# Patient Record
Sex: Female | Born: 1955 | Race: Black or African American | Hispanic: No | Smoking: Never smoker
Health system: Southern US, Community
[De-identification: ages and names within clinical notes are randomized; demographics above are authoritative.]

## PROBLEM LIST (undated history)

## (undated) DIAGNOSIS — F419 Anxiety disorder, unspecified: Secondary | ICD-10-CM

## (undated) DIAGNOSIS — K449 Diaphragmatic hernia without obstruction or gangrene: Secondary | ICD-10-CM

## (undated) DIAGNOSIS — Z460 Encounter for fitting and adjustment of spectacles and contact lenses: Secondary | ICD-10-CM

## (undated) DIAGNOSIS — J189 Pneumonia, unspecified organism: Secondary | ICD-10-CM

## (undated) DIAGNOSIS — K219 Gastro-esophageal reflux disease without esophagitis: Secondary | ICD-10-CM

## (undated) DIAGNOSIS — Z8489 Family history of other specified conditions: Secondary | ICD-10-CM

## (undated) DIAGNOSIS — G43909 Migraine, unspecified, not intractable, without status migrainosus: Secondary | ICD-10-CM

## (undated) DIAGNOSIS — F410 Panic disorder [episodic paroxysmal anxiety] without agoraphobia: Secondary | ICD-10-CM

## (undated) DIAGNOSIS — F329 Major depressive disorder, single episode, unspecified: Secondary | ICD-10-CM

## (undated) DIAGNOSIS — F32A Depression, unspecified: Secondary | ICD-10-CM

## (undated) DIAGNOSIS — K649 Unspecified hemorrhoids: Secondary | ICD-10-CM

## (undated) DIAGNOSIS — M1990 Unspecified osteoarthritis, unspecified site: Secondary | ICD-10-CM

## (undated) DIAGNOSIS — K5790 Diverticulosis of intestine, part unspecified, without perforation or abscess without bleeding: Secondary | ICD-10-CM

## (undated) DIAGNOSIS — M797 Fibromyalgia: Secondary | ICD-10-CM

## (undated) DIAGNOSIS — G629 Polyneuropathy, unspecified: Secondary | ICD-10-CM

## (undated) DIAGNOSIS — K2970 Gastritis, unspecified, without bleeding: Secondary | ICD-10-CM

## (undated) DIAGNOSIS — E119 Type 2 diabetes mellitus without complications: Secondary | ICD-10-CM

## (undated) HISTORY — DX: Diaphragmatic hernia without obstruction or gangrene: K44.9

## (undated) HISTORY — DX: Migraine, unspecified, not intractable, without status migrainosus: G43.909

## (undated) HISTORY — PX: UPPER GI ENDOSCOPY: SHX6162

## (undated) HISTORY — PX: TUBAL LIGATION: SHX77

## (undated) HISTORY — DX: Gastro-esophageal reflux disease without esophagitis: K21.9

## (undated) HISTORY — DX: Diverticulosis of intestine, part unspecified, without perforation or abscess without bleeding: K57.90

## (undated) HISTORY — DX: Polyneuropathy, unspecified: G62.9

## (undated) HISTORY — DX: Anxiety disorder, unspecified: F41.9

## (undated) HISTORY — PX: COLONOSCOPY: SHX174

## (undated) HISTORY — DX: Major depressive disorder, single episode, unspecified: F32.9

## (undated) HISTORY — PX: JOINT REPLACEMENT: SHX530

## (undated) HISTORY — DX: Depression, unspecified: F32.A

## (undated) HISTORY — DX: Gastritis, unspecified, without bleeding: K29.70

## (undated) HISTORY — PX: BUNIONECTOMY: SHX129

## (undated) HISTORY — DX: Type 2 diabetes mellitus without complications: E11.9

---

## 1978-12-23 ENCOUNTER — Encounter: Payer: Self-pay | Admitting: Family Medicine

## 1999-10-29 ENCOUNTER — Encounter: Payer: Self-pay | Admitting: Family Medicine

## 1999-10-29 ENCOUNTER — Encounter: Admission: RE | Admit: 1999-10-29 | Discharge: 1999-10-29 | Payer: Self-pay | Admitting: Family Medicine

## 2000-02-07 ENCOUNTER — Other Ambulatory Visit: Admission: RE | Admit: 2000-02-07 | Discharge: 2000-02-07 | Payer: Self-pay | Admitting: *Deleted

## 2000-09-29 HISTORY — PX: CARPAL TUNNEL RELEASE: SHX101

## 2001-03-08 ENCOUNTER — Encounter: Admission: RE | Admit: 2001-03-08 | Discharge: 2001-03-08 | Payer: Self-pay | Admitting: *Deleted

## 2001-03-08 ENCOUNTER — Other Ambulatory Visit: Admission: RE | Admit: 2001-03-08 | Discharge: 2001-03-08 | Payer: Self-pay | Admitting: *Deleted

## 2001-03-08 ENCOUNTER — Encounter: Payer: Self-pay | Admitting: *Deleted

## 2004-05-23 ENCOUNTER — Ambulatory Visit (HOSPITAL_COMMUNITY): Admission: RE | Admit: 2004-05-23 | Discharge: 2004-05-23 | Payer: Self-pay | Admitting: Orthopedic Surgery

## 2004-05-23 ENCOUNTER — Ambulatory Visit (HOSPITAL_BASED_OUTPATIENT_CLINIC_OR_DEPARTMENT_OTHER): Admission: RE | Admit: 2004-05-23 | Discharge: 2004-05-23 | Payer: Self-pay | Admitting: Orthopedic Surgery

## 2006-12-28 ENCOUNTER — Ambulatory Visit: Payer: Self-pay | Admitting: Internal Medicine

## 2006-12-29 ENCOUNTER — Ambulatory Visit: Payer: Self-pay | Admitting: Internal Medicine

## 2007-04-23 ENCOUNTER — Encounter: Admission: RE | Admit: 2007-04-23 | Discharge: 2007-04-23 | Payer: Self-pay | Admitting: Family Medicine

## 2007-08-25 ENCOUNTER — Encounter: Admission: RE | Admit: 2007-08-25 | Discharge: 2007-08-25 | Payer: Self-pay | Admitting: Family Medicine

## 2008-08-29 LAB — CONVERTED CEMR LAB: Pap Smear: NORMAL

## 2008-09-29 HISTORY — PX: SHOULDER ARTHROSCOPY WITH ROTATOR CUFF REPAIR: SHX5685

## 2008-12-19 ENCOUNTER — Ambulatory Visit: Payer: Self-pay | Admitting: Family Medicine

## 2008-12-19 DIAGNOSIS — J309 Allergic rhinitis, unspecified: Secondary | ICD-10-CM | POA: Insufficient documentation

## 2008-12-19 DIAGNOSIS — K5732 Diverticulitis of large intestine without perforation or abscess without bleeding: Secondary | ICD-10-CM | POA: Insufficient documentation

## 2008-12-19 DIAGNOSIS — K253 Acute gastric ulcer without hemorrhage or perforation: Secondary | ICD-10-CM | POA: Insufficient documentation

## 2008-12-19 DIAGNOSIS — E7800 Pure hypercholesterolemia, unspecified: Secondary | ICD-10-CM | POA: Insufficient documentation

## 2008-12-19 DIAGNOSIS — G43009 Migraine without aura, not intractable, without status migrainosus: Secondary | ICD-10-CM | POA: Insufficient documentation

## 2008-12-19 DIAGNOSIS — F332 Major depressive disorder, recurrent severe without psychotic features: Secondary | ICD-10-CM | POA: Insufficient documentation

## 2008-12-19 DIAGNOSIS — E1169 Type 2 diabetes mellitus with other specified complication: Secondary | ICD-10-CM | POA: Insufficient documentation

## 2008-12-25 ENCOUNTER — Encounter: Payer: Self-pay | Admitting: Family Medicine

## 2009-01-02 ENCOUNTER — Telehealth: Payer: Self-pay | Admitting: Family Medicine

## 2009-01-24 ENCOUNTER — Ambulatory Visit: Payer: Self-pay | Admitting: Family Medicine

## 2009-02-07 ENCOUNTER — Encounter: Payer: Self-pay | Admitting: Family Medicine

## 2009-02-12 ENCOUNTER — Encounter: Payer: Self-pay | Admitting: Family Medicine

## 2009-04-05 ENCOUNTER — Encounter: Payer: Self-pay | Admitting: Family Medicine

## 2009-04-18 ENCOUNTER — Encounter: Payer: Self-pay | Admitting: Family Medicine

## 2009-04-24 ENCOUNTER — Encounter: Payer: Self-pay | Admitting: Family Medicine

## 2009-04-27 ENCOUNTER — Ambulatory Visit: Payer: Self-pay | Admitting: Family Medicine

## 2009-04-27 DIAGNOSIS — I152 Hypertension secondary to endocrine disorders: Secondary | ICD-10-CM | POA: Insufficient documentation

## 2009-04-27 DIAGNOSIS — I10 Essential (primary) hypertension: Secondary | ICD-10-CM | POA: Insufficient documentation

## 2009-05-16 ENCOUNTER — Encounter: Payer: Self-pay | Admitting: Family Medicine

## 2009-06-13 ENCOUNTER — Encounter: Payer: Self-pay | Admitting: Family Medicine

## 2009-07-11 ENCOUNTER — Encounter: Payer: Self-pay | Admitting: Family Medicine

## 2009-08-08 ENCOUNTER — Encounter: Payer: Self-pay | Admitting: Family Medicine

## 2009-08-21 ENCOUNTER — Ambulatory Visit: Payer: Self-pay | Admitting: Family Medicine

## 2009-08-29 ENCOUNTER — Encounter: Payer: Self-pay | Admitting: Family Medicine

## 2009-09-06 ENCOUNTER — Ambulatory Visit: Payer: Self-pay | Admitting: Family Medicine

## 2009-09-06 LAB — CONVERTED CEMR LAB
HDL: 50 mg/dL
LDL Cholesterol: 154 mg/dL

## 2009-09-17 ENCOUNTER — Encounter: Payer: Self-pay | Admitting: Family Medicine

## 2009-09-26 ENCOUNTER — Encounter: Payer: Self-pay | Admitting: Family Medicine

## 2009-10-06 ENCOUNTER — Telehealth: Payer: Self-pay | Admitting: Family Medicine

## 2009-10-29 ENCOUNTER — Encounter: Payer: Self-pay | Admitting: Family Medicine

## 2009-11-07 ENCOUNTER — Encounter: Payer: Self-pay | Admitting: Family Medicine

## 2010-01-26 ENCOUNTER — Encounter: Payer: Self-pay | Admitting: Family Medicine

## 2010-02-15 ENCOUNTER — Ambulatory Visit: Payer: Self-pay | Admitting: Family Medicine

## 2010-04-24 ENCOUNTER — Encounter: Payer: Self-pay | Admitting: Family Medicine

## 2010-06-04 ENCOUNTER — Ambulatory Visit: Payer: Self-pay | Admitting: Family Medicine

## 2010-06-06 ENCOUNTER — Encounter: Payer: Self-pay | Admitting: Family Medicine

## 2010-07-29 ENCOUNTER — Ambulatory Visit: Payer: Self-pay | Admitting: Family Medicine

## 2010-09-10 ENCOUNTER — Ambulatory Visit: Payer: Self-pay | Admitting: Family Medicine

## 2010-10-29 NOTE — Progress Notes (Signed)
  Phone Note Call from Patient   Caller: Patient Summary of Call: Call from Call a nurse.  Patient over 1 1/2 hours away, coughing for over a week.  Wheezing with sputum production.  No shortness of breath, no fevers.  Gets bronchitis regularly.  Agreed to call in abx eventhough we typically don't do that given that she it is a weekend and she lives so far away with bad road conditions.  She will call us if symptoms worsen. Initial call taken by: Ruthe Mannan MD,  October 06, 2009 10:51 AM    New/Updated Medications: AZITHROMYCIN 250 MG  TABS (AZITHROMYCIN) 2 by  mouth today and then 1 daily for 4 days Prescriptions: AZITHROMYCIN 250 MG  TABS (AZITHROMYCIN) 2 by  mouth today and then 1 daily for 4 days  #6 x 0   Entered and Authorized by:   Ruthe Mannan MD   Signed by:   Ruthe Mannan MD on 10/06/2009   Method used:   Electronically to        [facility] [address] (retail)       [address], [address]       [place], [place]  [address]       Ph: [phone]       Fax: [phone]   RxID:   [id number]

## 2010-10-29 NOTE — Assessment & Plan Note (Signed)
Summary: REFILL MEDICATION/CLE   Vital Signs:  Patient profile:   55 year old female Height:      62 inches Weight:      156 pounds BMI:     28.64 Temp:     97.8 degrees F oral Pulse rate:   72 / minute Pulse rhythm:   regular BP sitting:   102 / 74  (right arm) Cuff size:   regular  Vitals Entered By: Linde Gillis CMA Duncan Dull) (June 04, 2010 8:25 AM) CC: medication refills   History of Present Illness: Depression.."tolerable". Continues on fluoxetine. No SI, good sleep.  Poor energy.Marland Kitchen"I just don't want to do anything" Some anhedonia.  Occ panic attacks..using xanax 0-3 times a week.  ? did not tolerate lexapro in past.  Not interested in counselor.  Problems Prior to Update: 1)  Sinusitis, Acute  (ICD-461.9) 2)  Preventive Health Care  (ICD-V70.0) 3)  Routine Gynecological Examination  (ICD-V72.31) 4)  Prediabetes  (ICD-790.29) 5)  Elevated Blood Pressure Without Diagnosis of Hypertension  (ICD-796.2) 6)  Family History of Cad Female 1st Degree Relative <50  (ICD-V17.3) 7)  Migraine, Common  (ICD-346.10) 8)  Hx of Diverticulitis, Colon  (ICD-562.11) 9)  Depression  (ICD-311) 10)  Gastric Ulcer, Acute  (ICD-531.30) 11)  ? of Fibromyalgia  (ICD-729.1) 12)  Hypercholesterolemia  (ICD-272.0) 13)  Allergic Rhinitis  (ICD-477.9) 14)  Tubal Ligation Sterilization Status  (ICD-V26.51)  Current Medications (verified): 1)  Prevacid 15 Mg Cpdr (Lansoprazole) .... Once Daily 2)  Fexofenadine Hcl 180 Mg Tabs (Fexofenadine Hcl) .... Take 1 Tablet By Mouth Once A Day 3)  Multivitamins  Caps (Multiple Vitamin) .... One Capsule Daily. 4)  Fluoxetine Hcl 40 Mg Caps (Fluoxetine Hcl) .... Take 1 Tablet By Mouth Once A Day 5)  Topamax 100 Mg Tabs (Topiramate) .... Take One By Mouth Every Night 6)  Xanax 0.25 Mg Tabs (Alprazolam) .... Take One Tablet By Mouth As Needed  Allergies: 1)  ! Sulfa  Past History:  Past medical, surgical, family and social histories (including risk  factors) reviewed, and no changes noted (except as noted below).  Past Medical History: Reviewed history from 12/19/2008 and no changes required. ALLERGIC RHINITIS HYPERCHOLESTEROLEMIA FIBROMYALGIA Depression  Past Surgical History: Reviewed history from 04/27/2009 and no changes required. Carpal tunnel release-2006 Tubal ligation - 1980'S EGD: PUD  2009 Dr. Juanda Chance 02/2009 right rotator cuff repair  Family History: Reviewed history from 12/19/2008 and no changes required. FATHER-CHF MOTHER-HTN, HYPERCHOLESTEROLEMIA, ?CVA brother: MI age 24 Family History of CAD Female 1st degree relative <50 sister: fibromyalgia sister; high chol, HTN sister: DM  Social History: Reviewed history from 12/19/2008 and no changes required. 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 CV:  Denies chest pain or discomfort. Resp:  Denies shortness of breath.  Physical Exam  General:  Well-developed,well-nourished,in no acute distress; alert,appropriate and cooperative throughout examination Mouth:  MMM Neck:  no carotid bruit or thyromegaly no cervical or supraclavicular lymphadenopathy  Lungs:  Normal respiratory effort, chest expands symmetrically. Lungs are clear to auscultation, no crackles or wheezes. Heart:  Normal rate and regular rhythm. S1 and S2 normal without gallop, murmur, click, rub or other extra sounds. Pulses:  R and L posterior tibial pulses are full and equal bilaterally  Psych:  Cognition and judgment appear intact. Alert and cooperative with normal attention  span and concentration. No apparent delusions, illusions, hallucinations   Impression & Recommendations:  Problem # 1:  DEPRESSION (ICD-311) Discussed options to acheive better control of mood.Primitivo Gauze not currently interested in  counseling or change in medicaiotn. Will work on increasing exercise and stress reduction and relaxation techniques at home.  Her updated medication list for this problem includes:    Fluoxetine Hcl 40 Mg Caps (Fluoxetine hcl) .Marland Kitchen... Take 1 tablet by mouth once a day    Xanax 0.25 Mg Tabs (Alprazolam) .Marland Kitchen... Take one tablet by mouth as needed  Complete Medication List: 1)  Prevacid 15 Mg Cpdr (Lansoprazole) .... Once daily 2)  Fexofenadine Hcl 180 Mg Tabs (Fexofenadine hcl) .... Take 1 tablet by mouth once a day 3)  Multivitamins Caps (Multiple vitamin) .... One capsule daily. 4)  Fluoxetine Hcl 40 Mg Caps (Fluoxetine hcl) .... Take 1 tablet by mouth once a day 5)  Topamax 100 Mg Tabs (Topiramate) .... Take one by mouth every night 6)  Xanax 0.25 Mg Tabs (Alprazolam) .... Take one tablet by mouth as needed  Patient Instructions: 1)  Scheduled CPX with labs prior fastng in 2011 Prescriptions: FLUOXETINE HCL 40 MG CAPS (FLUOXETINE HCL) Take 1 tablet by mouth once a day  #90 x 3   Entered and Authorized by:   Kerby Nora MD   Signed by:   Kerby Nora MD on 06/04/2010   Method used:   Electronically to        [facility] (retail)       [address]       [place], [place]  [address]       Ph: [phone]       Fax: [phone]   RxID:   [id number]   Current Allergies (reviewed today): ! SULFA

## 2010-10-29 NOTE — Letter (Signed)
Summary: [facility]  [facility]   Imported By: Lanelle Bal 11/06/2009 12:18:34  _____________________________________________________________________  External Attachment:    Type:   Image     Comment:   External Document

## 2010-10-29 NOTE — Assessment & Plan Note (Signed)
Summary: SWOLLEN RIGHT GLAND,EAR/CLE   Vital Signs:  Patient profile:   55 year old female Weight:      161 pounds Temp:     97.7 degrees F oral Pulse rate:   88 / minute Pulse rhythm:   regular BP sitting:   150 / 100  (left arm) Cuff size:   regular  Vitals Entered By: Selena Batten Dance CMA Duncan Dull) (July 29, 2010 10:47 AM) CC: Swollen gland and right ear pain   History of Present Illness: CC: swolleng gland and r ear pain  5-6 d h/o R ear pain, swelling in throat causing pain to swallow, not necessarily sore throat. + RN started today.  + cough started today, slight sputum production.  No congestion.  No fevers/chills.  Has tried mucinex which helped some.  Ear pain - No hearing changes, dizziness, n/v, draining.  + mild ringing in ears.  No HA recently.  not drinking as much as she should be.  No smokers at home. + sick contacts.  Flu shot 2 wks ago, feeling poorly since.  bp up but had red bull this am  Current Medications (verified): 1)  Prevacid 15 Mg Cpdr (Lansoprazole) .... Once Daily 2)  Fexofenadine Hcl 180 Mg Tabs (Fexofenadine Hcl) .... Take 1 Tablet By Mouth Once A Day 3)  Multivitamins  Caps (Multiple Vitamin) .... One Capsule Daily. 4)  Fluoxetine Hcl 40 Mg Caps (Fluoxetine Hcl) .... Take 1 Tablet By Mouth Once A Day 5)  Topamax 100 Mg Tabs (Topiramate) .... Take One By Mouth Every Night 6)  Xanax 0.25 Mg Tabs (Alprazolam) .... Take One Tablet By Mouth As Needed  Allergies: 1)  ! 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 No apparent alopecia or balding.  no sinus tenderness Eyes:  Conjunctiva clear bilaterally.  Ears:  clear fluid R>L TMs, good light reflex, external canals not irritated or erythematous Nose:  clear Mouth:  MMM, mild pharyngeal erythema, no exudates Neck:  slight AC LAD Lungs:  Normal respiratory effort, chest expands symmetrically. Lungs are clear to auscultation, no crackles or wheezes. Heart:  Normal rate and regular rhythm. S1 and S2 normal without gallop, murmur, click, rub or other extra sounds. Pulses:  2+ rad pulses Extremities:  no pedal edema Skin:  Intact without suspicious lesions or rashes   Impression & Recommendations:  Problem # 1:  VIRAL URI (ICD-465.9) Instructed on symptomatic treatment. Call if symptoms persist or worsen.  advised to call at end of week, if still with sxs, in that case consider Zpack to help prevent bronchitis from developing.  declines expectorant/suppressant today.  Her updated medication list for this problem includes:    Fexofenadine Hcl 180 Mg Tabs (Fexofenadine hcl) .Marland Kitchen... Take 1 tablet by mouth once a day  Complete Medication List: 1)  Prevacid 15 Mg Cpdr (Lansoprazole) .... Once daily 2)  Fexofenadine Hcl 180 Mg Tabs (Fexofenadine hcl) .... Take 1 tablet by mouth once a day 3)  Multivitamins Caps (Multiple vitamin) .... One capsule daily. 4)  Fluoxetine Hcl 40 Mg Caps (Fluoxetine hcl) .... Take  1 tablet by mouth once a day 5)  Topamax 100 Mg Tabs (Topiramate) .... Take one by mouth every night 6)  Xanax 0.25 Mg Tabs (Alprazolam) .... Take one tablet by mouth as needed  Patient Instructions: 1)  Sounds like you have a viral upper respiratory infection. 2)  Antibiotics are not needed for this.  Viral infections usually take 7-10 days to resolve.  The cough can last up to 4-6 weeks to go away. 3)  Advil for inflammation. 4)  Please return if  you are not improving as expected, or if you have high fevers (>101.5) or difficulty breathing or drooling.  Push fluids and rest. 5)  Call clinic with questions.  Pleasure to see you today!    Orders Added: 1)  Est. Patient Level III [[address]]    Current Allergies (reviewed today): ! SULFA

## 2010-10-29 NOTE — Assessment & Plan Note (Signed)
Summary: HEAD COLD AND THROAT PROBLEMS CYD   Vital Signs:  Patient profile:   55 year old female Weight:      161 pounds Temp:     97.6 degrees F oral Pulse rate:   76 / minute Pulse rhythm:   regular BP sitting:   124 / 90  (left arm) Cuff size:   regular  Vitals Entered By: Lowella Petties CMA (Feb 15, 2010 4:40 PM)   Acute Visit History:      The patient complains of cough, earache, nasal discharge, sinus problems, and sore throat.  These symptoms began 5 days ago.  She denies fever.  Other comments include: body ache chills post nasal drip Using mucinex and Nyquil. .        The cough interferes with her sleep.  The character of the cough is described as nonproductive.  There is no history of wheezing or shortness of breath associated with her cough.        The earache is located on both sides.  There is no history of recent antibiotic usage associated with the earache.        She complains of sinus pressure, teeth aching, ears being blocked, and nasal congestion.        Problems Prior to Update: 1)  Preventive Health Care  (ICD-V70.0) 2)  Routine Gynecological Examination  (ICD-V72.31) 3)  Prediabetes  (ICD-790.29) 4)  Elevated Blood Pressure Without Diagnosis of Hypertension  (ICD-796.2) 5)  Family History of Cad Female 1st Degree Relative <50  (ICD-V17.3) 6)  Migraine, Common  (ICD-346.10) 7)  Hx of Diverticulitis, Colon  (ICD-562.11) 8)  Depression  (ICD-311) 9)  Gastric Ulcer, Acute  (ICD-531.30) 10)  ? of Fibromyalgia  (ICD-729.1) 11)  Hypercholesterolemia  (ICD-272.0) 12)  Allergic Rhinitis  (ICD-477.9) 13)  Tubal Ligation Sterilization Status  (ICD-V26.51)  Current Medications (verified): 1)  Prevacid 15 Mg Cpdr (Lansoprazole) .... Once Daily 2)  Fexofenadine Hcl 180 Mg Tabs (Fexofenadine Hcl) .... Take 1 Tablet By Mouth Once A Day 3)  Apap-Isometheptene-Caffeine 325-65-100 Mg Caps (Apap-Isometheptene-Caffeine) .... As Needed. 4)  Multivitamins  Caps (Multiple  Vitamin) .... One Capsule Daily. 5)  Fluoxetine Hcl 40 Mg Caps (Fluoxetine Hcl) .... Take 1 Tablet By Mouth Once A Day 6)  Topamax 100 Mg Tabs (Topiramate) .... Take One By Mouth Every Night 7)  Amoxicillin 500 Mg Caps (Amoxicillin) .... 2 Tab By Mouth Two Times A Day X 10 Days  Allergies (verified): 1)  ! Sulfa  Past History:  Past medical, surgical, family and social histories (including risk factors) reviewed, and no changes noted (except as noted below).  Past Medical History: Reviewed history from 12/19/2008 and no changes required. ALLERGIC RHINITIS HYPERCHOLESTEROLEMIA FIBROMYALGIA Depression  Past Surgical History: Reviewed history from 04/27/2009 and no changes required. Carpal tunnel release-2006 Tubal ligation - 1980'S EGD: PUD  2009 Dr. Juanda Chance 02/2009 right rotator cuff repair  Family History: Reviewed history from 12/19/2008 and no changes required. FATHER-CHF MOTHER-HTN, HYPERCHOLESTEROLEMIA, ?CVA brother: MI age 29 Family History of CAD Female 1st degree relative <50 sister: fibromyalgia sister; high chol, HTN sister: DM  Social History: Reviewed history from 12/19/2008 and no changes required. 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 CV:  Denies chest pain or discomfort. Resp:  Denies shortness of breath. GI:  Denies abdominal pain.  Physical Exam  General:  Well-developed,well-nourished,in no acute distress; alert,appropriate and cooperative throughout examination Head:  ttp B maxilarry sinuses  Ears:  clear fluid B TMs Nose:  nasal dischargemucosal pallor.   Mouth:  MMM, no oropharyngeal erythema.  Neck:  no carotid bruit or thyromegaly no cervical or supraclavicular lymphadenopathy  Lungs:  Normal respiratory effort, chest expands symmetrically. Lungs are clear to auscultation, no  crackles or wheezes. Heart:  Normal rate and regular rhythm. S1 and S2 normal without gallop, murmur, click, rub or other extra sounds.   Impression & Recommendations:  Problem # 1:  SINUSITIS, ACUTE (ICD-461.9)  Her updated medication list for this problem includes:    Amoxicillin 500 Mg Caps (Amoxicillin) .Marland Kitchen... 2 tab by mouth two times a day x 10 days  Instructed on treatment. Call if symptoms persist or worsen.   Complete Medication List: 1)  Prevacid 15 Mg Cpdr (Lansoprazole) .... Once daily 2)  Fexofenadine Hcl 180 Mg Tabs (Fexofenadine hcl) .... Take 1 tablet by mouth once a day 3)  Apap-isometheptene-caffeine 325-65-100 Mg Caps (Apap-isometheptene-caffeine) .... As needed. 4)  Multivitamins Caps (Multiple vitamin) .... One capsule daily. 5)  Fluoxetine Hcl 40 Mg Caps (Fluoxetine hcl) .... Take 1 tablet by mouth once a day 6)  Topamax 100 Mg Tabs (Topiramate) .... Take one by mouth every night 7)  Amoxicillin 500 Mg Caps (Amoxicillin) .... 2 tab by mouth two times a day x 10 days  Patient Instructions: 1)  Use mucinex, no D. 2)   Start nasal saline irrigation. 3)  Start antibiotics.  4)   Call if symtoms not improving in 5-7 days.  Prescriptions: AMOXICILLIN 500 MG CAPS (AMOXICILLIN) 2 tab by mouth two times a day x 10 days  #40 x 0   Entered and Authorized by:   Kerby Nora MD   Signed by:   Kerby Nora MD on 02/15/2010   Method used:   Electronically to        [facility] (retail)       [address], [address]       [place], [place]  [address]       Ph: [phone]       Fax: [phone]   RxID:   [id number]   Prior Medications (reviewed today): PREVACID 15 MG CPDR (LANSOPRAZOLE) ONCE DAILY FEXOFENADINE HCL 180 MG TABS (FEXOFENADINE HCL) Take 1 tablet by mouth once a day APAP-ISOMETHEPTENE-CAFFEINE 325-65-100 MG CAPS (APAP-ISOMETHEPTENE-CAFFEINE) AS NEEDED. MULTIVITAMINS  CAPS (MULTIPLE VITAMIN) ONE CAPSULE  DAILY. FLUOXETINE HCL 40 MG CAPS (FLUOXETINE HCL) Take 1 tablet by mouth once a day TOPAMAX 100 MG TABS (TOPIRAMATE) take one by mouth every night AMOXICILLIN 500 MG CAPS (AMOXICILLIN) 2 tab by mouth two times a day x 10 days Current Allergies (reviewed today): ! SULFA

## 2010-10-29 NOTE — Letter (Signed)
Summary: [facility]  [facility]   Imported By: Lanelle Bal 11/12/2009 11:44:01  _____________________________________________________________________  External Attachment:    Type:   Image     Comment:   External Document

## 2010-10-29 NOTE — Letter (Signed)
Summary: [facility]  [facility]   Imported By: Sherian Rein 06/14/2010 09:47:22  _____________________________________________________________________  External Attachment:    Type:   Image     Comment:   External Document

## 2010-10-29 NOTE — Letter (Signed)
Summary: [facility]  [facility]   Imported By: Lanelle Bal 10/02/2009 12:15:50  _____________________________________________________________________  External Attachment:    Type:   Image     Comment:   External Document

## 2010-11-01 NOTE — Letter (Signed)
Summary: [facility]  [facility]   Imported By: Maryln Gottron 04/29/2010 14:56:14  _____________________________________________________________________  External Attachment:    Type:   Image     Comment:   External Document

## 2010-12-10 ENCOUNTER — Other Ambulatory Visit: Payer: Self-pay | Admitting: Family Medicine

## 2010-12-10 ENCOUNTER — Encounter (INDEPENDENT_AMBULATORY_CARE_PROVIDER_SITE_OTHER): Payer: 59 | Admitting: Family Medicine

## 2010-12-10 ENCOUNTER — Encounter: Payer: Self-pay | Admitting: Family Medicine

## 2010-12-10 ENCOUNTER — Other Ambulatory Visit (HOSPITAL_COMMUNITY)
Admission: RE | Admit: 2010-12-10 | Discharge: 2010-12-10 | Disposition: A | Discharge status: Home / Self Care | Payer: 59 | Source: Ambulatory Visit | Attending: Family Medicine | Admitting: Family Medicine

## 2010-12-10 DIAGNOSIS — Z01419 Encounter for gynecological examination (general) (routine) without abnormal findings: Secondary | ICD-10-CM

## 2010-12-10 DIAGNOSIS — Z1159 Encounter for screening for other viral diseases: Secondary | ICD-10-CM | POA: Insufficient documentation

## 2010-12-10 DIAGNOSIS — I10 Essential (primary) hypertension: Secondary | ICD-10-CM

## 2010-12-10 DIAGNOSIS — Z0000 Encounter for general adult medical examination without abnormal findings: Secondary | ICD-10-CM

## 2010-12-10 LAB — CONVERTED CEMR LAB
Bilirubin Urine: NEGATIVE
Glucose, Urine, Semiquant: NEGATIVE
Ketones, urine, test strip: NEGATIVE
Specific Gravity, Urine: >=1.03

## 2010-12-17 NOTE — Assessment & Plan Note (Signed)
Summary: cpx   Vital Signs:  Patient profile:   55 year old female Height:      62 inches Weight:      163.50 pounds BMI:     30.01 Temp:     98.6 degrees F oral Pulse rate:   88 / minute Pulse rhythm:   regular BP sitting:   120 / 90  (left arm) Cuff size:   regular  Vitals Entered By: Benny Lennert CMA Duncan Dull) (December 10, 2010 2:43 PM)  History of Present Illness: Chief complaint cpx  The patient is here for annual wellness exam and preventative care.    Depression: Continues to be tolerableon  fluoxetine daily, Using xanax as needed.Marland Kitchen about 2-3 times a month.   Did have a panic attack yesterday after returning to work after vacation.Marland Kitchen arms B felt weak/numb for few hours, panicky  High cholesterol:  Due for recheck  Prediabetes: Due for recheck.  History of BP elevation.. no diagnosis...she has noted BPs more consistently elevated at home and at work.  2 lb weight  gain. Started Yoga. Treadmill 3 times a week.  Has been eating healthy foods...fruits and veggies. Strong family history of HTN.  Migraines.. well controlled on topamax 100 daily.Marland Kitchen occuring once a month. Using amerge once a month.  Problems Prior to Update: 1)  Preventive Health Care  (ICD-V70.0) 2)  Routine Gynecological Examination  (ICD-V72.31) 3)  Prediabetes  (ICD-790.29) 4)  Essential Hypertension, Benign  (ICD-401.1) 5)  Family History of Cad Female 1st Degree Relative <50  (ICD-V17.3) 6)  Migraine, Common  (ICD-346.10) 7)  Hx of Diverticulitis, Colon  (ICD-562.11) 8)  Depression  (ICD-311) 9)  Gastric Ulcer, Acute  (ICD-531.30) 10)  ? of Fibromyalgia  (ICD-729.1) 11)  Hypercholesterolemia  (ICD-272.0) 12)  Allergic Rhinitis  (ICD-477.9) 13)  Tubal Ligation Sterilization Status  (ICD-V26.51)  Current Medications (verified): 1)  Prevacid 15 Mg Cpdr (Lansoprazole) .... Once Daily 2)  Fexofenadine Hcl 180 Mg Tabs (Fexofenadine Hcl) .... Take 1 Tablet By Mouth Once A Day 3)  Multivitamins  Caps  (Multiple Vitamin) .... One Capsule Daily. 4)  Fluoxetine Hcl 40 Mg Caps (Fluoxetine Hcl) .... Take 1 Tablet By Mouth Once A Day 5)  Topamax 100 Mg Tabs (Topiramate) .... Take One By Mouth Every Night 6)  Xanax 0.25 Mg Tabs (Alprazolam) .... Take One Tablet By Mouth As Needed 7)  Amerge 1 Mg Tabs (Naratriptan Hcl) .... As Needed For Headache 8)  Nabumetone 500 Mg Tabs (Nabumetone) .... As Directed For Headache  Allergies: 1)  ! Sulfa  Past History:  Past medical, surgical, family and social histories (including risk factors) reviewed, and no changes noted (except as noted below).  Past Medical History: Reviewed history from 12/19/2008 and no changes required. ALLERGIC RHINITIS HYPERCHOLESTEROLEMIA FIBROMYALGIA Depression  Past Surgical History: Reviewed history from 04/27/2009 and no changes required. Carpal tunnel release-2006 Tubal ligation - 1980'S EGD: PUD  2009 Dr. Juanda Chance 02/2009 right rotator cuff repair  Family History: Reviewed history from 12/19/2008 and no changes required. FATHER-CHF MOTHER-HTN, HYPERCHOLESTEROLEMIA, ?CVA brother: MI age 57 Family History of CAD Female 1st degree relative <50 sister: fibromyalgia sister; high chol, HTN sister: DM  Social History: Reviewed history from 12/19/2008 and no changes required. 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 CV:  Denies chest pain or discomfort. Resp:  Denies shortness  of breath. GI:  Denies abdominal pain and bloody stools. GU:  Denies dysuria.  Physical Exam  General:  overweight female in NAD  Ears:  External ear exam shows no significant lesions or deformities.  Otoscopic examination reveals clear canals, tympanic membranes are intact bilaterally without bulging, retraction, inflammation or discharge. Hearing is grossly normal  bilaterally. Nose:  External nasal examination shows no deformity or inflammation. Nasal mucosa are pink and moist without lesions or exudates. Mouth:  Oral mucosa and oropharynx without lesions or exudates.  Teeth in good repair. Neck:  no carotid bruit or thyromegaly no cervical or supraclavicular lymphadenopathy  Lungs:  Normal respiratory effort, chest expands symmetrically. Lungs are clear to auscultation, no crackles or wheezes. Heart:  Normal rate and regular rhythm. S1 and S2 normal without gallop, murmur, click, rub or other extra sounds. Abdomen:  Bowel sounds positive,abdomen soft and non-tender without masses, organomegaly or hernias noted. Genitalia:  Pelvic Exam:        External: normal female genitalia without lesions or masses        Vagina: normal without lesions or masses        Cervix: normal without lesions or masses        Adnexa: normal bimanual exam without masses or fullness        Uterus: normal by palpation        Pap smear: performed Msk:  No deformity or scoliosis noted of thoracic or lumbar spine.   Pulses:  R and L posterior tibial pulses are full and equal bilaterally  Extremities:  no edema  Neurologic:  No cranial nerve deficits noted. Station and gait are normal. Plantar reflexes are down-going bilaterally. DTRs are symmetrical throughout. Sensory, motor and coordinative functions appear intact. Skin:  Intact without suspicious lesions or rashes Psych:  Cognition and judgment appear intact. Alert and cooperative with normal attention span and concentration. No apparent delusions, illusions, hallucinations   Impression & Recommendations:  Problem # 1:  PREVENTIVE HEALTH CARE (ICD-V70.0) The patient's preventative maintenance and recommended screening tests for an annual wellness exam were reviewed in full today. Brought up to date unless services declined.  Counselled on the importance of diet, exercise, and its role in overall health and mortality. The  patient's FH and SH was reviewed, including their home life, tobacco status, and drug and alcohol status.     Problem # 2:  ROUTINE GYNECOLOGICAL EXAMINATION (ICD-V72.31) PAP pending.   Problem # 3:  PREDIABETES (ICD-790.29) Due for reeval.   Problem # 4:  ESSENTIAL HYPERTENSION, BENIGN (ICD-401.1) Assessment: New  Eval for secondary causes, end organ damage, and eval for risk factors. Will start HCTZ daily. Follow Bps at home.. call if above goal.  Orders: UA Dipstick w/o Micro (manual) (21308): trace protein in urine... eval kidney function.  EKG w/ Interpretation (93000): showed  Her updated medication list for this problem includes:    Hydrochlorothiazide 25 Mg Tabs (Hydrochlorothiazide) .Marland Kitchen... 1 tab by mouth daily  Problem # 5:  DEPRESSION (ICD-311) Stable control. Her updated medication list for this problem includes:    Fluoxetine Hcl 40 Mg Caps (Fluoxetine hcl) .Marland Kitchen... Take 1 tablet by mouth once a day    Xanax 0.25 Mg Tabs (Alprazolam) .Marland Kitchen... Take one tablet by mouth as needed  Complete Medication List: 1)  Prevacid 15 Mg Cpdr (Lansoprazole) .... Once daily 2)  Fexofenadine Hcl 180 Mg Tabs (Fexofenadine hcl) .... Take 1 tablet by mouth once a day 3)  Multivitamins Caps (Multiple vitamin) .Marland KitchenMarland KitchenMarland Kitchen  One capsule daily. 4)  Fluoxetine Hcl 40 Mg Caps (Fluoxetine hcl) .... Take 1 tablet by mouth once a day 5)  Topamax 100 Mg Tabs (Topiramate) .... Take one by mouth every night 6)  Xanax 0.25 Mg Tabs (Alprazolam) .... Take one tablet by mouth as needed 7)  Amerge 1 Mg Tabs (Naratriptan hcl) .... As needed for headache 8)  Nabumetone 500 Mg Tabs (Nabumetone) .... As directed for headache 9)  Hydrochlorothiazide 25 Mg Tabs (Hydrochlorothiazide) .Marland Kitchen.. 1 tab by mouth daily  Patient Instructions: 1)  Work on regular exercise. Weight loss. 2)  Follow BP at home ... goal <140/90. 3)  Start HCTZ for BP control. 4)   Follow up in 2 weeks.  Prescriptions: HYDROCHLOROTHIAZIDE 25 MG TABS  (HYDROCHLOROTHIAZIDE) 1 tab by mouth daily  #30 x 11   Entered and Authorized by:   Kerby Nora MD   Signed by:   Kerby Nora MD on 12/10/2010   Method used:   Electronically to        [facility] [address] (retail)       [address], [address]       [place], [place]  [address]       Ph: [phone]       Fax: [phone]   RxID:   [id number] Prudy Feeler 0.25 MG TABS (ALPRAZOLAM) take one tablet by mouth as needed  #30 x 0   Entered and Authorized by:   Kerby Nora MD   Signed by:   Kerby Nora MD on 12/10/2010   Method used:   Print then Give to Patient   RxID:   [id number] TOPAMAX 100 MG TABS (TOPIRAMATE) take one by mouth every night  #90 x 3   Entered and Authorized by:   Kerby Nora MD   Signed by:   Kerby Nora MD on 12/10/2010   Method used:   Electronically to        [facility] (retail)       [facility]       [address]       [place], [place]  [address]       Ph: [phone]       Fax: [phone]   RxID:   [id number]    Orders Added: 1)  UA Dipstick w/o Micro (manual) [81002] 2)  EKG w/ Interpretation [93000] 3)  Est. Patient 40-64 years [06269]     Current Allergies (reviewed today): ! SULFA  Last Flu Vaccine:  given (09/06/2009 2:23:47 PM) Flu Vaccine Result Date:  08/29/2010 Flu Vaccine Result:  given Flu Vaccine Next Due:  1 yr    Past Medical History:    Reviewed history from 12/19/2008 and no changes required:       ALLERGIC RHINITIS       HYPERCHOLESTEROLEMIA       FIBROMYALGIA       Depression  Past Surgical History:    Reviewed history from 04/27/2009 and no changes required:       Carpal tunnel release-2006       Tubal ligation - 1980'S       EGD: PUD  2009 Dr. Juanda Chance       02/2009 right rotator cuff repair     Laboratory Results   Urine Tests  Date/Time Received: December 10, 2010 4:05 PM   Routine Urinalysis   Color: yellow Appearance:  Clear Glucose: negative   (Normal Range: Negative)  Bilirubin: negative   (Normal Range: Negative) Ketone: negative   (Normal Range: Negative) Spec. Gravity: >=1.030   (Normal Range: 1.003-1.035) Blood: trace-intact   (Normal Range: Negative) pH: 5.0   (Normal Range: 5.0-8.0) Protein: trace   (Normal Range: Negative) Urobilinogen: 0.2   (Normal Range: 0-1) Nitrite: negative   (Normal Range: Negative) Leukocyte Esterace: negative   (Normal Range: Negative)       Appended Document: cpx EKG showed no LVH , occ PAC, no ST changes, no Q waves.

## 2011-01-02 LAB — HEPATIC FUNCTION PANEL: AST: 81 U/L — AB (ref 13–35)

## 2011-01-02 LAB — BASIC METABOLIC PANEL
BUN: 22 mg/dL — AB (ref 4–21)
Glucose: 107 mg/dL

## 2011-01-02 LAB — LIPID PANEL
HDL: 48 mg/dL (ref 35–70)
LDL Cholesterol: -127 mg/dL

## 2011-01-21 ENCOUNTER — Ambulatory Visit: Payer: 59 | Admitting: Family Medicine

## 2011-02-04 ENCOUNTER — Encounter: Payer: Self-pay | Admitting: Family Medicine

## 2011-02-10 ENCOUNTER — Encounter: Payer: Self-pay | Admitting: Family Medicine

## 2011-02-14 NOTE — Op Note (Signed)
Lisa Odom, Lisa Odom                             ACCOUNT NO.:  [id number]   MEDICAL RECORD NO.:  [id number]                   PATIENT TYPE:  AMB   LOCATION:  [facility]                                  FACILITY:  [facility]   PHYSICIAN:  Cindee Salt, M.D.                    DATE OF BIRTH:  22-Mar-1956   DATE OF PROCEDURE:  05/23/2004  DATE OF DISCHARGE:                                 OPERATIVE REPORT   PREOPERATIVE DIAGNOSIS:  Carpal tunnel syndrome, right hand.   POSTOPERATIVE DIAGNOSIS:  Carpal tunnel syndrome, right hand.   OPERATION PERFORMED:  Decompression, right median nerve.   SURGEON:  Cindee Salt, M.D.   ASSISTANTCarolyne Fiscal.   ANESTHESIA:  Forearm based IV regional.   INDICATIONS FOR PROCEDURE:  The patient is a 55 year old female with a  history of carpal tunnel syndrome, EMG nerve conduction studies positive,  which has not responded to conservative treatment.   DESCRIPTION OF PROCEDURE:  The patient was brought to the operating room  where forearm based IV regional anesthetic was carried out without  difficulty.  She was prepped using DuraPrep in supine position, right arm  free.  A longitudinal incision was made in the palm and carried down through  subcutaneous tissue.  Bleeders were electrocauterized.  Palmar fascia was  split.  Superficial palmar arch was identified, the flexor tendon to the  ring finger identified to the ulnar side of the median nerve.  Carpal  retinaculum was incised with sharp dissection.  Right angle and Sewell  retractor were placed between skin and forearm fascia.  The fascia was  released for approximately 1.5 cm proximal to the wrist crease under direct  vision.  No further lesions were identified.  The area of compression of the  nerve was apparent.  Tenosynovial tissue was thickened.  The wound was  irrigated.  The skin was closed with interrupted 5-0 nylon suture.  Sterile  compressive dressing and splint was applied.  The patient tolerated the  procedure well and was taken to the recovery room for observation in  satisfactory condition.  She is discharged to home to return to the [facility] in one week on Vicodin.                                               Cindee Salt, M.D.    Angelique Blonder  D:  05/23/2004  T:  05/23/2004  Job:  [id number]

## 2011-02-14 NOTE — Assessment & Plan Note (Signed)
[facility]                         GASTROENTEROLOGY OFFICE NOTE   Lisa Odom                          MRN:          [id number]  DATE:12/28/2006                            DOB:          10-02-1955    [name] is a very nice 55 year old patient of Dr. Duaine Dredge who we saw  in the past for rectal bleeding.  She had a colonoscopy in March 2005  with findings of diverticulosis of the left colon.  This time she  developed dark maroon stools, about 12 or 13 days ago.  She at that time  was taking up to 8-10 Excedrin a day for neck pain and headaches.  She  denies any epigastric pain at the time of the bleeding.  The melena or  the dark stools, went on for at least 4 days.  She thinks she has had at  least 6-10 melenic bowel movements.  It finally cleared up this past  weekend and for past several days patient has had normal colored stools.  Dr. Duaine Dredge  advised her to stop taking Excedrin and NSAIDs  and put  her on Celebrex 200 mg three times a day.  He also advised to continue  Prilosec 20 mg daily.  Her hemoglobin at time of the bleeding was at  12.2, hematocrit 37.  Previous hemoglobin in November 2007 was 12.6,  hematocrit 37.2. There is a nondocumented history of a duodenal ulcer in  the past, at age of 17 or 76. Pt never had an upper endoscopy or upper  GI series to prove the diagnosis.   MEDICATIONS:  1. Prilosec 20 mg p.o. daily.  2. Celebrex 200 mg p.o. t.i.d.  3. Provera 2.5 mg p.o. daily.  4. Estradiol 10.5 mg daily.  5. Also on alprazolam 0.5 mg p.r.n.   PAST HISTORY:  Significant for migraine headaches, depression, anxiety.   FAMILY HISTORY:  Negative for colon cancer, heart disease in brother,  ovarian cancer in cousin, breast cancer in grandmother, diabetes in aunt  and sister, alcoholism in uncle.   SOCIAL HISTORY:  Single with college degree.  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  She does not smoke, drinks  alcohol rarely.   REVIEW OF SYSTEMS:  Positive for eyeglasses, allergies.   PHYSICAL EXAMINATION:  Blood pressure 128/72, pulse 88, weight 161  pounds.  She was alert, oriented, in no distress.  HEENT:  Sclerae anicteric.  LUNGS:  Clear to auscultation.  COR:  Normal S1, S2.  ABDOMEN:  Soft with minimal tenderness in the epigastrium, no  pulsations, no mass.  Right upper quadrant was normal.  RECTAL:  Today shows soft, yellow, Hemoccult negative stool.   IMPRESSION:  A 55 year old African American female with what sounds like  an acute upper gastrointestinal bleed related to aspirin.  She did not  seem to drop significant hemoglobin, but the description of the stools  and the association with aspirin makes it very likely that it was an  upper, rather than a lower gastrointestinal bleed.  We discussed use of  NSAIDs and aspirin with her and  the risk of gastrointestinal blood loss.  Because of magnitude of the bleeding, lasting for several days, I think  she ought to have an upper endoscopy to look for duodenal or gastric  ulcer.   PLAN:  1. Patient was scheduled for upper endoscopy.  We will also proceed      with H. pylori  testing at that time.  2. Continue Prilosec 20 mg daily.  3. I would hold off on her Celebrex until we can do an upper      endoscopy.  She may benefit from further evaluation of her      headaches.     [name], MD  Electronically Signed    DMB/MedQ  DD: 12/28/2006  DT: 12/28/2006  Job #: [id number]   cc:   Mosetta Putt, M.D.

## 2011-02-19 ENCOUNTER — Encounter: Payer: Self-pay | Admitting: Family Medicine

## 2011-06-14 ENCOUNTER — Other Ambulatory Visit: Payer: Self-pay | Admitting: Family Medicine

## 2011-09-05 ENCOUNTER — Ambulatory Visit (INDEPENDENT_AMBULATORY_CARE_PROVIDER_SITE_OTHER)
Admission: RE | Admit: 2011-09-05 | Discharge: 2011-09-05 | Disposition: A | Discharge status: Home / Self Care | Payer: 59 | Source: Ambulatory Visit | Attending: Family Medicine | Admitting: Family Medicine

## 2011-09-05 ENCOUNTER — Encounter: Payer: Self-pay | Admitting: Family Medicine

## 2011-09-05 ENCOUNTER — Ambulatory Visit (INDEPENDENT_AMBULATORY_CARE_PROVIDER_SITE_OTHER): Payer: 59 | Admitting: Family Medicine

## 2011-09-05 VITALS — BP 120/70 | HR 70 | Temp 97.8°F | Ht 62.0 in | Wt 168.4 lb

## 2011-09-05 DIAGNOSIS — M542 Cervicalgia: Secondary | ICD-10-CM

## 2011-09-05 DIAGNOSIS — F329 Major depressive disorder, single episode, unspecified: Secondary | ICD-10-CM

## 2011-09-05 DIAGNOSIS — K219 Gastro-esophageal reflux disease without esophagitis: Secondary | ICD-10-CM

## 2011-09-05 MED ORDER — DULOXETINE HCL 30 MG PO CPEP: 30.0000 mg | ORAL_CAPSULE | Freq: Every day | ORAL | Status: DC

## 2011-09-05 MED ORDER — CYCLOBENZAPRINE HCL 10 MG PO TABS: 10.0000 mg | ORAL_TABLET | Freq: Every evening | ORAL | Status: AC | PRN

## 2011-09-05 MED ORDER — PANTOPRAZOLE SODIUM 40 MG PO TBEC: 40.0000 mg | DELAYED_RELEASE_TABLET | Freq: Every day | ORAL | Status: DC

## 2011-09-05 NOTE — Patient Instructions (Signed)
Use muscle relaxant at night for neck pain.  Apply heat to neck.  Start stretching exercises.. Info packet.  We will call with X-ray results. Change prevacid to generic protonix for reflux and stomach irritation. If no problems with Side effects to muscle relaxant.. Go ahead and stop prozac and change to Cymbalta.

## 2011-09-05 NOTE — Progress Notes (Signed)
  Subjective:    Patient ID: Lisa Odom, female    DOB: [birth year], 55 y.o.   MRN: [id number]  HPI  55 year old female with history of migraine, HTN presents with intermittant pain across upper shoulders, into neck , describes as burning pain.. Off and on for 4 years, but worse in last week with flare. No known fall or injury. Decrease ROM of neck with pain.  Worse with turning head.  Feels both hands are  Weak (has some weakness from past carpal tunnel) No numbness, no shooting pain from neck.  Heat, ice, OTC naprosyn, tylenol not helping. Hx of PUD... Some associate stomach irritation with OTC meds. Prevacid not helping as much. Prilosec has not helped. HAs flares of indigestion lasting for days.   Has history of right rotator cuff surgery, past B carpal tunnel   Depression: on prozac for many years... Moderate control... Decreased motivation, some anhedonia, tearful. Fatigue, limited appetite. No insomnia. No SI, no HI.  Review of Systems  Constitutional: Negative for fever and fatigue.  HENT: Negative for ear pain.   Eyes: Negative for pain.  Respiratory: Negative for chest tightness and shortness of breath.   Cardiovascular: Negative for chest pain, palpitations and leg swelling.  Gastrointestinal: Negative for abdominal pain.  Genitourinary: Negative for dysuria.       Objective:   Physical Exam  Constitutional: She is oriented to person, place, and time. She appears well-developed and well-nourished.  HENT:  Head: Normocephalic and atraumatic.  Right Ear: External ear normal.  Left Ear: External ear normal.  Mouth/Throat: Oropharynx is clear and moist.  Eyes: Conjunctivae are normal. Pupils are equal, round, and reactive to light.  Neck: Normal range of motion. Neck supple. No thyromegaly present.       ttp over B trapezius, no central vertebral pain.  neg Spurling B. Strength in upper extremeties 5/5  Cardiovascular: Normal rate, regular rhythm, normal heart  sounds and intact distal pulses.  Exam reveals no gallop and no friction rub.   No murmur heard. Pulmonary/Chest: Effort normal and breath sounds normal. No respiratory distress. She has no wheezes. She has no rales. She exhibits no tenderness.  Musculoskeletal:       Right shoulder: Normal.       Left shoulder: Normal.       B neg impingement sign  Neurological: She is alert and oriented to person, place, and time. She has normal reflexes.  Psychiatric: Her speech is normal. Judgment and thought content normal. She is slowed and withdrawn. Cognition and memory are normal. She exhibits a depressed mood.       Tearful          Assessment & Plan:

## 2011-09-05 NOTE — Assessment & Plan Note (Addendum)
Check neck films given length of time of symptoms.  Pain most likely MSK spasm. MAy be related to stress and poor control of mood.  NSIDs not great option for her.  Start with muscle relaxant, heat, stretching.  Follow up if not improving.

## 2011-09-05 NOTE — Assessment & Plan Note (Signed)
Poor control on prevacid.. Stop and change to pantoprazole for trial.

## 2011-09-05 NOTE — Assessment & Plan Note (Signed)
Poor control.. Given chronic pain.. Will try trial of cymbalta, increase as tolerated. Follow up in 1 month.

## 2011-09-26 ENCOUNTER — Encounter: Payer: Self-pay | Admitting: Family Medicine

## 2011-09-26 ENCOUNTER — Ambulatory Visit (INDEPENDENT_AMBULATORY_CARE_PROVIDER_SITE_OTHER): Payer: 59 | Admitting: Family Medicine

## 2011-09-26 DIAGNOSIS — M542 Cervicalgia: Secondary | ICD-10-CM

## 2011-09-26 DIAGNOSIS — F329 Major depressive disorder, single episode, unspecified: Secondary | ICD-10-CM

## 2011-09-26 MED ORDER — DULOXETINE HCL 60 MG PO CPEP: 60.0000 mg | ORAL_CAPSULE | Freq: Every day | ORAL | Status: DC

## 2011-09-26 NOTE — Assessment & Plan Note (Signed)
Due to degenerative changes in cervical spine.  Pt has noted improvement on Cymbalta.  She is not interested in referral to PM and R at this time.

## 2011-09-26 NOTE — Assessment & Plan Note (Signed)
Improved control on cymbalta. Stop prozac as not helpful and make be causing additional SE of sleepy feeling.

## 2011-09-26 NOTE — Patient Instructions (Signed)
Schedule CPX in 01/2012 with fasting labs prior. Continue home stretching exercises in neck.  Stop prozac.  Continue cymbalta 60 mg daily.

## 2011-09-26 NOTE — Progress Notes (Signed)
  Subjective:    Patient ID: Lisa Odom, female    DOB: 1955/10/05, 55 y.o.   MRN: [id number]  HPI Chronic neck pain, improved control. Reviewed neck films with pt. She is not interested in referral to PM and R at this time. Depression, improved control: She has been on cymbalta  60 mg for the last 3 weeks. She stopped prozac but was uncertain if she was supposed to remain off, so restarted. She feels 25-30 % better. She has more motivation. Sleeping great at night. Christmas is a bad time for her given close to MOm death anniversary...did better with this this year. She is having less headaches, neck pain is less severe.  Review of Systems  Constitutional: Negative for fever and fatigue.  Respiratory: Negative for shortness of breath.   Cardiovascular: Negative for chest pain, palpitations and leg swelling.  Musculoskeletal: Negative for gait problem.  Neurological: Negative for syncope, weakness and numbness.       Objective:   Physical Exam  Constitutional: She is oriented to person, place, and time. She appears well-developed and well-nourished.  HENT:  Head: Normocephalic.  Eyes: Conjunctivae are normal. Pupils are equal, round, and reactive to light.  Neck: Normal range of motion. Neck supple. No thyromegaly present.       Neg spurling's  Cardiovascular: Normal rate, regular rhythm, normal heart sounds and intact distal pulses.  Exam reveals no gallop and no friction rub.   No murmur heard. Pulmonary/Chest: Effort normal and breath sounds normal. No respiratory distress. She has no wheezes. She has no rales. She exhibits no tenderness.  Neurological: She is alert and oriented to person, place, and time. No cranial nerve deficit.          Assessment & Plan:

## 2011-10-10 ENCOUNTER — Ambulatory Visit: Payer: 59 | Admitting: Family Medicine

## 2011-11-06 ENCOUNTER — Telehealth: Payer: Self-pay | Admitting: Family Medicine

## 2011-11-06 DIAGNOSIS — G8929 Other chronic pain: Secondary | ICD-10-CM
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
## 2011-12-29 ENCOUNTER — Other Ambulatory Visit: Payer: Self-pay | Admitting: Family Medicine

## 2012-01-23 ENCOUNTER — Other Ambulatory Visit: Payer: Self-pay | Admitting: *Deleted

## 2012-01-23 NOTE — Telephone Encounter (Signed)
Faxed refill request   

## 2012-01-26 MED ORDER — DULOXETINE HCL 60 MG PO CPEP: 60.0000 mg | ORAL_CAPSULE | Freq: Every day | ORAL | Status: DC

## 2012-02-03 ENCOUNTER — Other Ambulatory Visit: Payer: Self-pay

## 2012-02-03 MED ORDER — PANTOPRAZOLE SODIUM 40 MG PO TBEC: 40.0000 mg | DELAYED_RELEASE_TABLET | Freq: Every day | ORAL | Status: DC

## 2012-02-03 MED ORDER — HYDROCHLOROTHIAZIDE 25 MG PO TABS: 25.0000 mg | ORAL_TABLET | Freq: Every day | ORAL | Status: DC

## 2012-02-03 NOTE — Telephone Encounter (Signed)
Pt said insurance co pay has increased for local pharmacy and pt request HCTZ 25 mg # 90 x 2 and Pantoprazole 40 mg # 90 x 2 sent to [facility]. Pt advised done while on phone.

## 2012-02-10 ENCOUNTER — Telehealth: Payer: Self-pay

## 2012-02-10 NOTE — Telephone Encounter (Signed)
Shandi with [facility] said pt restarted HCTZ yesterday and started itching last night, continued itching today and today has hives on chest and back with abdominal cramps and nausea. Pt also has slight SOB but pt thinks that is from allergies.  Pt does not want to go to UC. Dr Ermalene Searing recommends stopping HCTZ, pt can take Benadryl at night to manage symptoms(Shandi said pt can take Benadryl); during the day pt can take OTC claritin and if not improved tomorrow pt to call and schedule appt. If symptoms worsen pt to go to UC if needed. Dr Ermalene Searing also said would not start new BP now. Pt to monitor BP at home 2-3 days and call back with readings for further instruction.Shandi said she will instruct pt.

## 2012-02-10 NOTE — Telephone Encounter (Signed)
Caller: [name]/Patient; PCP: Kerby Nora E.; CB#: [phone]; Call regarding Rash/Hives;  [name] started taking HCTZ on 02/09/12.  Started itching that night.  Continuing to have widespread itching  today and has hives on her chest, back, and arms.  Afebrile.  C/o nausea all day today.  C/o mild SOB which she states is from allergies.  C/o abdominal cramping like she "has to use the bathroom".  Utilized Hives Guideline.  911 Disposition.  Pt refuses 911.  Pt states that she could come to the office.  No appts available in [facility].  Called office.  Office nurse spoke with Dr. Ermalene Searing VO:  Pt should go to UC.  If refuses UC, can take Benadryl at night and Claritin during the day.  Appt tomorrow if not improving.  Stop HCTZ.  Monitor BP for 2-3 days and call office with readings.  Informed pt.  Lisa Odom states that she will go to UC.

## 2012-02-11 ENCOUNTER — Encounter (HOSPITAL_COMMUNITY): Payer: Self-pay | Admitting: *Deleted

## 2012-02-11 ENCOUNTER — Emergency Department (HOSPITAL_COMMUNITY)
Admission: EM | Admit: 2012-02-11 | Discharge: 2012-02-11 | Disposition: A | Discharge status: Home / Self Care | Payer: 59 | Source: Home / Self Care | Attending: Emergency Medicine | Admitting: Emergency Medicine

## 2012-02-11 DIAGNOSIS — T7840XA Allergy, unspecified, initial encounter: Secondary | ICD-10-CM

## 2012-02-11 MED ORDER — PREDNISONE 10 MG PO TABS: 10.0000 mg | ORAL_TABLET | Freq: Every day | ORAL | Status: DC

## 2012-02-11 MED ORDER — HYDROXYZINE HCL 25 MG PO TABS: 25.0000 mg | ORAL_TABLET | Freq: Four times a day (QID) | ORAL | Status: AC

## 2012-02-11 MED ORDER — HYDROXYZINE HCL 25 MG PO TABS: 25.0000 mg | ORAL_TABLET | Freq: Four times a day (QID) | ORAL | Status: DC

## 2012-02-11 NOTE — Discharge Instructions (Signed)

## 2012-02-11 NOTE — ED Notes (Addendum)
Pt  Reports  She  Developed  A  Rash  Several  Days  Ago    After  She  Took  Mail  Order  hctz   And protonix 4o   Mg  She  Reports  She  Has  Taken these   meds  In past   And  Never  Had  Problem  However   She  Has    Not  Taken these mail order  Pills  Before    She  Is  Awake  As  Well  As  Alert  Speaking in  Complete  sentances     The  Rash  Itches    Pt  States  She  Is  Allergic  To  sulfa

## 2012-02-11 NOTE — ED Provider Notes (Signed)
History     CSN: [id number]  Arrival date & time 02/11/12  1212   First MD Initiated Contact with Patient 02/11/12 1248      Chief Complaint  Patient presents with  . Rash    (Consider location/radiation/quality/duration/timing/severity/associated sxs/prior treatment) Patient is a 56 y.o. female presenting with rash. The history is provided by the patient. No language interpreter was used.  Rash  This is a new problem. The current episode started 2 days ago. The problem has been gradually worsening. The problem is associated with new medications. There has been no fever. The pain has been constant since onset. Associated symptoms include itching. She has tried antihistamines for the symptoms. The treatment provided moderate relief.  Pt complains of a rash full body.    History reviewed. No pertinent past medical history.  History reviewed. No pertinent past surgical history.  No family history on file.  History  Substance Use Topics  . Smoking status: Never Smoker   . Smokeless tobacco: Not on file  . Alcohol Use: Not on file    OB History    Grav Para Term Preterm Abortions TAB SAB Ect Mult Living                  Review of Systems  Skin: Positive for itching and rash.  All other systems reviewed and are negative.    Allergies  Sulfonamide derivatives  Home Medications   Current Outpatient Rx  Name Route Sig Dispense Refill  . DULOXETINE HCL 60 MG PO CPEP Oral Take 1 capsule (60 mg total) by mouth daily. 90 capsule 1  . HYDROCHLOROTHIAZIDE 25 MG PO TABS Oral Take 1 tablet (25 mg total) by mouth daily. 90 tablet 2  . LANSOPRAZOLE 15 MG PO CPDR Oral Take 15 mg by mouth daily.      Marland Kitchen PANTOPRAZOLE SODIUM 40 MG PO TBEC Oral Take 1 tablet (40 mg total) by mouth daily. 90 tablet 2    BP 126/88  Pulse 109  Temp(Src) 98.4 F (36.9 C) (Oral)  Resp 18  SpO2 97%  Physical Exam  Nursing note and vitals reviewed. Constitutional: She is oriented to person, place,  and time. She appears well-developed and well-nourished.  HENT:  Head: Normocephalic and atraumatic.  Eyes: Conjunctivae and EOM are normal. Pupils are equal, round, and reactive to light.  Neck: Normal range of motion.  Cardiovascular: Normal rate.   Musculoskeletal: Normal range of motion.  Neurological: She is alert and oriented to person, place, and time. She has normal reflexes.  Skin: Rash noted.  Psychiatric: She has a normal mood and affect.    ED Course  Procedures (including critical care time)  Labs Reviewed - No data to display No results found.   No diagnosis found.    MDM    Pt given rx for prednisone and atarax     Elson Areas, [place] 02/11/12 1351

## 2012-02-16 ENCOUNTER — Ambulatory Visit (INDEPENDENT_AMBULATORY_CARE_PROVIDER_SITE_OTHER): Payer: 59 | Admitting: Family Medicine

## 2012-02-16 ENCOUNTER — Encounter: Payer: Self-pay | Admitting: *Deleted

## 2012-02-16 ENCOUNTER — Encounter: Payer: Self-pay | Admitting: Family Medicine

## 2012-02-16 VITALS — BP 140/84 | HR 92 | Temp 98.1°F | Ht 62.0 in

## 2012-02-16 DIAGNOSIS — I10 Essential (primary) hypertension: Secondary | ICD-10-CM

## 2012-02-16 DIAGNOSIS — F410 Panic disorder [episodic paroxysmal anxiety] without agoraphobia: Secondary | ICD-10-CM | POA: Insufficient documentation

## 2012-02-16 DIAGNOSIS — F329 Major depressive disorder, single episode, unspecified: Secondary | ICD-10-CM

## 2012-02-16 MED ORDER — ALPRAZOLAM 0.25 MG PO TABS: 0.2500 mg | ORAL_TABLET | Freq: Every day | ORAL | Status: AC | PRN

## 2012-02-16 NOTE — Assessment & Plan Note (Signed)
Remains well controlled off HCTZ... Continue to follow off the meds.

## 2012-02-16 NOTE — Progress Notes (Signed)
  Subjective:    Patient ID: Lisa Odom, female    DOB: 10-29-1955, 56 y.o.   MRN: [id number]  HPI 56 year old female with history of depression presents today in a panic attack... Tearful, inconsolable at times.She has been having issues concentrationg, feeling poorly. Feels numb in arms. She reports that this started today. Was feeling well over the weekend, calm and relaxed.  She has been on cymbalta, at last OV in 08/2012 she had been feeling better on 60 mg of cymbalta.  Off prozac given it was not helping and causing SE.   Was on prednisone in past few days for allergic reaction. Started 5 days ago, but did not take this in past 2 days. Had stopped HTCZ.. Thought to be causing allergic reaction.  At home BPs have been 126/88    Review of Systems     Objective:   Physical Exam        Assessment & Plan:

## 2012-02-16 NOTE — Assessment & Plan Note (Signed)
May be related to recent prednisone use. Discussed relaxation techniques. Use xanax prn in limited fashion.

## 2012-02-16 NOTE — Patient Instructions (Signed)
Continue to folllow the BPs at home.. Call if greater than 140/90. Continue the cymbalta. Use xanax only if having panic attack. Make list of things to do during a panic attack.  Schedule CPX/ panic attack follow up in next few weeks.. Work in please.

## 2012-02-16 NOTE — Assessment & Plan Note (Signed)
Was previously well controlled on cymbalta. Continue current dose, follow up in 1-2 weeks.

## 2012-02-25 NOTE — ED Provider Notes (Signed)
Medical screening examination/treatment/procedure(s) were performed by non-physician practitioner and as supervising physician I was immediately available for consultation/collaboration.  Luiz Blare MD   Luiz Blare, MD 02/25/12 [phone]

## 2012-08-04 ENCOUNTER — Encounter: Payer: Self-pay | Admitting: Family Medicine

## 2012-08-04 ENCOUNTER — Ambulatory Visit (INDEPENDENT_AMBULATORY_CARE_PROVIDER_SITE_OTHER): Payer: 59 | Admitting: Family Medicine

## 2012-08-04 VITALS — BP 130/80 | HR 84 | Temp 97.9°F | Wt 174.8 lb

## 2012-08-04 DIAGNOSIS — K219 Gastro-esophageal reflux disease without esophagitis: Secondary | ICD-10-CM

## 2012-08-04 DIAGNOSIS — F329 Major depressive disorder, single episode, unspecified: Secondary | ICD-10-CM

## 2012-08-04 MED ORDER — PANTOPRAZOLE SODIUM 40 MG PO TBEC: 40.0000 mg | DELAYED_RELEASE_TABLET | Freq: Two times a day (BID) | ORAL | Status: DC

## 2012-08-04 MED ORDER — SUCRALFATE 1 GM/10ML PO SUSP: 1.0000 g | Freq: Four times a day (QID) | ORAL | Status: DC

## 2012-08-04 NOTE — Patient Instructions (Signed)
Start back on the cymbalta, take carafate as needed and take protonix twice a day.  Elevate the head of your bed.  Try to cut back on excedrin, aleve, etc.   Schedule a follow up with Dr. Ermalene Searing.   Take care.

## 2012-08-04 NOTE — Progress Notes (Signed)
Was of cymbalta for depression but she stopped it as she was gaining weight and having trouble with staying focused and a lack of energy.  Her mood isn't improved in the meantime.  She had diffuse aches prev, that seemed to be improved on cymbalta, now worse off the medicine.    GERD sx worsened about 3 weeks ago.  Vomiting occ.  Upper abd pain and burning in chest.  Worse laying down.  Lives alone, safe at home.  Works at [employer], Merchandiser, retail at [employer].  Working long days and then the weekends, more in last 3 months.  She enjoys the work.  They had turnover in the department.  "I've always had stomach issues, so I know when I need to avoid spicy foods."  She has been avoiding acidic foods.  Nonsmoker.  Very rare etoh.  Has been on protonix for a few months.  BID protonix isn't helping. Occ cough. No voice changes.  Taking nsaids frequently for migraines. No blood in vomit.  Meds, vitals, and allergies reviewed.   ROS: See HPI.  Otherwise, noncontributory.  Tearful but in nad, regains composure Speech wnl, judgement appears intact Mmm Neck supple, no LA rrr ctab abd soft, not ttp, normal BS Ext w/o edema

## 2012-08-04 NOTE — Assessment & Plan Note (Signed)
Restart cymbalta.  If mood and migraines aren't improved on this, she'll likely need consideration with alternate treatment. She understood.  >25 min spent with face to face with patient, >50% counseling and/or coordinating care.

## 2012-08-04 NOTE — Assessment & Plan Note (Signed)
Needs to cut back on nsaids, eliminate if possible.  Start carafate qid, continue bid PPI, elevate head of bed.  Will have pt f/u with PCP.

## 2012-08-10 ENCOUNTER — Ambulatory Visit (INDEPENDENT_AMBULATORY_CARE_PROVIDER_SITE_OTHER): Payer: 59 | Admitting: Family Medicine

## 2012-08-10 ENCOUNTER — Encounter: Payer: Self-pay | Admitting: Family Medicine

## 2012-08-10 VITALS — BP 120/70 | HR 88 | Temp 98.3°F | Ht 62.0 in | Wt 171.0 lb

## 2012-08-10 DIAGNOSIS — R112 Nausea with vomiting, unspecified: Secondary | ICD-10-CM | POA: Insufficient documentation

## 2012-08-10 DIAGNOSIS — R1013 Epigastric pain: Secondary | ICD-10-CM | POA: Insufficient documentation

## 2012-08-10 MED ORDER — ONDANSETRON HCL 8 MG PO TABS: 8.0000 mg | ORAL_TABLET | Freq: Three times a day (TID) | ORAL | Status: DC | PRN

## 2012-08-10 NOTE — Progress Notes (Signed)
Subjective:    Patient ID: Lisa Odom, female    DOB: Jul 04, 1956, 56 y.o.   MRN: [id number]  HPI 56 year old female with history of gastric ulcer and GERD on protonix presents with  GERD sx worsened about 3 weeks ago. Vomiting occ. Upper abd pain and burning in chest. Worse laying down. Lives alone, safe at home. 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 She enjoys the work. They had turnover in the department. "I've always had stomach issues, so I know when I need to avoid spicy foods." She has been avoiding acidic foods. Nonsmoker. Very rare etoh. Has been on protonix for a few months. BID protonix isn't helping. Occ cough. No voice changes. Taking nsaids frequently for migraines.   She saw Dr. Para March for above symtpoms on 11/6... He recommended carafate in addition to BID PPI.   She reports that the carafate has helped some but not much. She has had continued vomiting, some upper abdominal pain, reflux symptoms as well. Saw some blood in emesis last week, but had bloody nose that day. Has stopped NSAIDs in last 1 1/2 weeks.  Saw Dr. Juanda Chance in past for GI. Had HPylori in past as well.     Review of Systems  Constitutional: Negative for fever and fatigue.  HENT: Negative for ear pain.   Eyes: Negative for pain.  Respiratory: Negative for chest tightness and shortness of breath.   Cardiovascular: Negative for chest pain, palpitations and leg swelling.  Gastrointestinal: Positive for nausea and abdominal pain. Negative for diarrhea, constipation and blood in stool.  Genitourinary: Negative for dysuria, urgency, flank pain and vaginal bleeding.       Objective:   Physical Exam  Constitutional: Vital signs are normal. She appears well-developed and well-nourished. She is cooperative.  Non-toxic appearance. She does not appear ill. No distress.  HENT:  Head: Normocephalic.  Right Ear: Hearing, tympanic  membrane, external ear and ear canal normal. Tympanic membrane is not erythematous, not retracted and not bulging.  Left Ear: Hearing, tympanic membrane, external ear and ear canal normal. Tympanic membrane is not erythematous, not retracted and not bulging.  Nose: No mucosal edema or rhinorrhea. Right sinus exhibits no maxillary sinus tenderness and no frontal sinus tenderness. Left sinus exhibits no maxillary sinus tenderness and no frontal sinus tenderness.  Mouth/Throat: Uvula is midline, oropharynx is clear and moist and mucous membranes are normal.  Eyes: Conjunctivae normal, EOM and lids are normal. Pupils are equal, round, and reactive to light. No foreign bodies found.  Neck: Trachea normal and normal range of motion. Neck supple. Carotid bruit is not present. No mass and no thyromegaly present.  Cardiovascular: Normal rate, regular rhythm, S1 normal, S2 normal, normal heart sounds, intact distal pulses and normal pulses.  Exam reveals no gallop and no friction rub.   No murmur heard. Pulmonary/Chest: Effort normal and breath sounds normal. Not tachypneic. No respiratory distress. She has no decreased breath sounds. She has no wheezes. She has no rhonchi. She has no rales.  Abdominal: Soft. Normal appearance and bowel sounds are normal. There is tenderness in the epigastric area.    Neurological: She is alert.  Skin: Skin is warm, dry and intact. No rash noted.  Psychiatric: Her speech is normal and behavior is normal. Judgment and thought content normal. Her mood appears not anxious. Cognition and memory are normal. She does not exhibit a depressed mood.  Assessment & Plan:

## 2012-08-10 NOTE — Assessment & Plan Note (Signed)
Zofran for symptoms. Push fluids.

## 2012-08-10 NOTE — Patient Instructions (Signed)
Can use zofran for nausea.  We will call with labs results. Stop by front desk to set up referral to Dr. Juanda Chance.

## 2012-08-10 NOTE — Addendum Note (Signed)
Addended by: Alvina Chou on: 08/10/2012 02:42 PM   Modules accepted: Orders

## 2012-08-10 NOTE — Assessment & Plan Note (Addendum)
Concerning for ulcer , pancreatitis.  Pt with history of PUD. Will eval with labs including Hpylori NOn tender over RUQ, but gallbladder pathology a possibility, consider [place] RUQ. Will move forward with GI referral back to Dr. Juanda Chance as pt likely needs endoscopy.  Continue carafate and BID protonix. Bland diet.

## 2012-08-11 LAB — CBC WITH DIFFERENTIAL
Basos: 0 % (ref 0–3)
Eosinophils Absolute: 0.3 10*3/uL (ref 0.0–0.4)
HCT: 39.4 % (ref 34.0–46.6)
Hemoglobin: 13.3 g/dL (ref 11.1–15.9)
Immature Grans (Abs): 0 10*3/uL (ref 0.0–0.1)
Lymphs: 31 % (ref 14–46)
MCHC: 33.8 g/dL (ref 31.5–35.7)
Monocytes: 6 % (ref 4–12)
Neutrophils Absolute: 5.6 10*3/uL (ref 1.4–7.0)
Neutrophils Relative %: 60 % (ref 40–74)
RBC: 4.63 x10E6/uL (ref 3.77–5.28)
WBC: 9.5 10*3/uL (ref 3.4–10.8)

## 2012-08-11 LAB — COMPREHENSIVE METABOLIC PANEL
ALT: 18 IU/L (ref 0–32)
Albumin: 4.5 g/dL (ref 3.5–5.5)
Alkaline Phosphatase: 115 IU/L (ref 39–117)
BUN/Creatinine Ratio: 15 (ref 9–23)
Chloride: 99 mmol/L (ref 97–108)
GFR calc Af Amer: 86 mL/min/{1.73_m2} (ref >59)
Potassium: 4 mmol/L (ref 3.5–5.2)
Total Bilirubin: 0.4 mg/dL (ref 0.0–1.2)

## 2012-08-11 LAB — LIPASE: Lipase: 22 U/L (ref 0–59)

## 2012-08-12 ENCOUNTER — Encounter: Payer: Self-pay | Admitting: Internal Medicine

## 2012-08-18 LAB — H. PYLORI ANTIGEN, STOOL: H pylori Ag, Stl: NEGATIVE

## 2012-08-23 ENCOUNTER — Encounter: Payer: Self-pay | Admitting: *Deleted

## 2012-09-03 ENCOUNTER — Encounter: Payer: Self-pay | Admitting: *Deleted

## 2012-10-05 ENCOUNTER — Ambulatory Visit (INDEPENDENT_AMBULATORY_CARE_PROVIDER_SITE_OTHER): Payer: 59 | Admitting: Internal Medicine

## 2012-10-05 ENCOUNTER — Encounter: Payer: Self-pay | Admitting: Internal Medicine

## 2012-10-05 VITALS — BP 102/70 | HR 72 | Ht 62.0 in | Wt 169.2 lb

## 2012-10-05 DIAGNOSIS — R110 Nausea: Secondary | ICD-10-CM

## 2012-10-05 DIAGNOSIS — R1110 Vomiting, unspecified: Secondary | ICD-10-CM

## 2012-10-05 DIAGNOSIS — K625 Hemorrhage of anus and rectum: Secondary | ICD-10-CM

## 2012-10-05 MED ORDER — DIAZEPAM 2 MG PO TABS: 2.0000 mg | ORAL_TABLET | Freq: Two times a day (BID) | ORAL | Status: DC | PRN

## 2012-10-05 MED ORDER — HYDROCORTISONE ACE-PRAMOXINE 2.5-1 % RE CREA: TOPICAL_CREAM | Freq: Two times a day (BID) | RECTAL | Status: DC

## 2012-10-05 MED ORDER — ALUM HYDROXIDE-MAG TRISILICATE 80-14.2 MG PO CHEW: CHEWABLE_TABLET | ORAL | Status: DC

## 2012-10-05 MED ORDER — MOVIPREP 100 G PO SOLR: 1.0000 | Freq: Once | ORAL | Status: DC

## 2012-10-05 MED ORDER — ONDANSETRON HCL 4 MG PO TABS: 4.0000 mg | ORAL_TABLET | Freq: Three times a day (TID) | ORAL | Status: DC | PRN

## 2012-10-05 NOTE — Patient Instructions (Addendum)
You have been scheduled for an endoscopy and colonoscopy with propofol. Please follow the written instructions given to you at your visit today. Please pick up your prep at the pharmacy within the next 1-3 days. If you use inhalers (even only as needed) or a CPAP machine, please bring them with you on the day of your procedure.  We have sent the following medications to your pharmacy for you to pick up at your convenience: Valium Analpram Zofran  Please purchase the following medications over the counter and take as directed: Gaviscon (OTC) take 1-2 tablets as needed for abdominal pain.  CC: Dr Kerby Nora

## 2012-10-05 NOTE — Progress Notes (Signed)
Lisa Odom 08/05/1956 MRN [id number]  History of Present Illness:  This is a 57 year old African American female with epigastric pain markedly improved after she increased her Protonix to 40 mg twice a day and took Carafate slurry 10 cc 4 times a day. She is about 90% improved  She takes BCs, up to 10 a day, 2 at a time for headaches. She has been doing it for many years and has been evaluated by a neurologist.  Her last upper endoscopy in April 2008 showed antral gastritis attributed to anti-inflammatory agents. She was diagnosed with a duodenal ulcer at age 68, according to her report. She was treated in the past by Dr.Blomgren for H. pylori. This was at least 10 years ago. Her most recent stool test for H. pylori was negative. She was evaluated for rectal bleeding in March 2005 with findings of a mild diverticulosis of the left colon. She continues to have bright red blood per rectum, especially recently. It occurs mostly when she wipes after a bowel movement. She denies being constipated or having diarrhea. 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 She used to take Valium which was rather effective.    Past Medical History  Diagnosis Date  . Diverticulosis   . Hiatal hernia   . Gastritis   . Migraine   . Depression   . Anxiety   . GERD (gastroesophageal reflux disease)    Past Surgical History  Procedure Date  . Shoulder arthroscopy with rotator cuff repair   . Carpal tunnel release     right    reports that she has never smoked. She has never used smokeless tobacco. She reports that she drinks alcohol. She reports that she does not use illicit drugs. family history includes Alcohol abuse in her maternal uncle and paternal uncle; Breast cancer in her maternal grandmother; Diabetes in her maternal aunt, paternal aunt, and sister; Heart disease in her brother; and Ovarian cancer in her  cousin. Allergies  Allergen Reactions  . Sulfonamide Derivatives     REACTION: Rash  . Hydrochlorothiazide Rash        Review of Systems: Denies dysphagia odynophagia or chest pain  The remainder of the 10 point ROS is negative except as outlined in H&P   Physical Exam: General appearance  Well developed, in no distress. Eyes- non icteric. HEENT nontraumatic, normocephalic. Mouth no lesions, tongue papillated, no cheilosis. Neck supple without adenopathy, thyroid not enlarged, no carotid bruits, no JVD. Lungs Clear to auscultation bilaterally. Cor normal S1, normal S2, regular rhythm, no murmur,  quiet precordium. Abdomen: Epigastric tenderness. Left lower quadrant tenderness. Normoactive bowel sounds. No distention. Rectal: Soft Hemoccult negative stool, firm scar tissue at 3:00 consistent with healed fissure. No pain on digital exam. Extremities no pedal edema. Skin no lesions. Neurological alert and oriented x 3. Psychological normal mood and affect.  Assessment and Plan:  Problem #1 Epigastric pain now resolved on increased doses of PPIs and Carafate. I suspect patient has gastritis or gastropathy due to [facility] powders. She has taken anti-inflammatory agents for headaches for many years and in fact may have an ulcer. She had dark melenic stools in 2008 when she was diagnosed with NSAID related antral gastritis. This time, she denies any black stools. He have discussed management of the headaches. She will see her neurologist again. We will add Valium 2 mg when necessary for stress and she will  continue on Protonix 40 mg twice a day. We will schedule her for an upper endoscopy and biopsies.  Problem #2 Low-volume hematochezia. Physical exam suggests an anal fissure. Her last colonoscopy was in the March 2005. We will proceed with a colonoscopy to rule out a neoplastic lesion. She will use Analpram cream 2.5% when necessary for rectal irritation.   10/05/2012 Lisa Odom

## 2012-10-19 ENCOUNTER — Ambulatory Visit (AMBULATORY_SURGERY_CENTER): Payer: 59 | Admitting: Internal Medicine

## 2012-10-19 ENCOUNTER — Encounter: Payer: Self-pay | Admitting: Internal Medicine

## 2012-10-19 VITALS — BP 90/70 | HR 81 | Temp 97.1°F | Resp 22 | Ht 62.0 in | Wt 169.0 lb

## 2012-10-19 DIAGNOSIS — K625 Hemorrhage of anus and rectum: Secondary | ICD-10-CM

## 2012-10-19 DIAGNOSIS — R1110 Vomiting, unspecified: Secondary | ICD-10-CM

## 2012-10-19 DIAGNOSIS — R1013 Epigastric pain: Secondary | ICD-10-CM

## 2012-10-19 DIAGNOSIS — R110 Nausea: Secondary | ICD-10-CM

## 2012-10-19 MED ORDER — DICYCLOMINE HCL 20 MG PO TABS: 20.0000 mg | ORAL_TABLET | Freq: Three times a day (TID) | ORAL | Status: DC | PRN

## 2012-10-19 MED ORDER — SODIUM CHLORIDE 0.9 % IV SOLN: 500.0000 mL | INTRAVENOUS | Status: DC

## 2012-10-19 MED ORDER — HYDROCORTISONE ACE-PRAMOXINE 1-1 % RE CREA: TOPICAL_CREAM | Freq: Two times a day (BID) | RECTAL | Status: DC

## 2012-10-19 NOTE — Op Note (Signed)
[facility] [address] [place], [address]   COLONOSCOPY PROCEDURE REPORT  PATIENT: Lisa, Odom  MR#: [id number] BIRTHDATE: [birth year] , 56  yrs. old GENDER: Female ENDOSCOPIST: Hart Carwin, MD REFERRED BY:  Excell Seltzer, M.D. PROCEDURE DATE:  10/19/2012 PROCEDURE:   Colonoscopy, diagnostic ASA CLASS:   Class I INDICATIONS:hematochezia and colonoscopy 2005- diverticulosis. MEDICATIONS: MAC sedation, administered by CRNA and propofol (Diprivan) 200mg  IV  DESCRIPTION OF PROCEDURE:   After the risks and benefits and of the procedure were explained, informed consent was obtained.        The LB [id number]-[id number]  endoscope was introduced through the anus and advanced to the cecum, which was identified by both the appendix and ileocecal valve .  The quality of the prep was good, using MoviPrep .  The instrument was then slowly withdrawn as the colon was fully examined.     COLON FINDINGS: There was moderate diverticulosis noted in the sigmoid colon with associated tortuosity, muscular hypertrophy, angulation and colonic spasm.     Retroflexed views revealed no abnormalities.     The scope was then withdrawn from the patient and the procedure completed.  COMPLICATIONS: There were no complications. ENDOSCOPIC IMPRESSION: There was moderate diverticulosis noted in the sigmoid colon severe spasm sigmoid colon- difficult to traverse  RECOMMENDATIONS: High fiber diet Bentyl 20 mg po bid Analpram 2.5 % prn rectal irritation   REPEAT EXAM: for Colonoscopy. 10 years  cc:  _______________________________ eSignedHart Carwin, MD 10/19/2012 12:13 PM     PATIENT NAME:  Lisa, Odom MR#: [id number]

## 2012-10-19 NOTE — Patient Instructions (Addendum)
Normal upper endoscopy  Colonoscopy- moderate diverticulosis, high fiber diet recommended. Bentyl 20mg  twice a day Analpram as needed for rectal irritation  YOU HAD AN ENDOSCOPIC PROCEDURE TODAY AT THE [facility]: Refer to the procedure report that was given to you for any specific questions about what was found during the examination.  If the procedure report does not answer your questions, please call your gastroenterologist to clarify.  If you requested that your care partner not be given the details of your procedure findings, then the procedure report has been included in a sealed envelope for you to review at your convenience later.  YOU SHOULD EXPECT: Some feelings of bloating in the abdomen. Passage of more gas than usual.  Walking can help get rid of the air that was put into your GI tract during the procedure and reduce the bloating. If you had a lower endoscopy (such as a colonoscopy or flexible sigmoidoscopy) you may notice spotting of blood in your stool or on the toilet paper. If you underwent a bowel prep for your procedure, then you may not have a normal bowel movement for a few days.  DIET: Your first meal following the procedure should be a light meal and then it is ok to progress to your normal diet.  A half-sandwich or bowl of soup is an example of a good first meal.  Heavy or fried foods are harder to digest and may make you feel nauseous or bloated.  Likewise meals heavy in dairy and vegetables can cause extra gas to form and this can also increase the bloating.  Drink plenty of fluids but you should avoid alcoholic beverages for 24 hours.  ACTIVITY: Your care partner should take you home directly after the procedure.  You should plan to take it easy, moving slowly for the rest of the day.  You can resume normal activity the day after the procedure however you should NOT DRIVE or use heavy machinery for 24 hours (because of the sedation medicines used during the test).     SYMPTOMS TO REPORT IMMEDIATELY: A gastroenterologist can be reached at any hour.  During normal business hours, 8:30 AM to 5:00 PM Monday through Friday, call [phone].  After hours and on weekends, please call the GI answering service at [phone] who will take a message and have the physician on call contact you.   Following lower endoscopy (colonoscopy or flexible sigmoidoscopy):  Excessive amounts of blood in the stool  Significant tenderness or worsening of abdominal pains  Swelling of the abdomen that is new, acute  Fever of 100F or higher  Following upper endoscopy (EGD)  Vomiting of blood or coffee ground material  New chest pain or pain under the shoulder blades  Painful or persistently difficult swallowing  New shortness of breath  Fever of 100F or higher  Black, tarry-looking stools  FOLLOW UP: If any biopsies were taken you will be contacted by phone or by letter within the next 1-3 weeks.  Call your gastroenterologist if you have not heard about the biopsies in 3 weeks.  Our staff will call the home number listed on your records the next business day following your procedure to check on you and address any questions or concerns that you may have at that time regarding the information given to you following your procedure. This is a courtesy call and so if there is no answer at the home number and we have not heard from you through the emergency  physician on call, we will assume that you have returned to your regular daily activities without incident.  SIGNATURES/CONFIDENTIALITY: You and/or your care partner have signed paperwork which will be entered into your electronic medical record.  These signatures attest to the fact that that the information above on your After Visit Summary has been reviewed and is understood.  Full responsibility of the confidentiality of this discharge information lies with you and/or your care-partner.

## 2012-10-19 NOTE — Progress Notes (Signed)
Called to room to assist during endoscopic procedure.  Patient ID and intended procedure confirmed with present staff. Received instructions for my participation in the procedure from the performing physician.  

## 2012-10-19 NOTE — Op Note (Signed)
[facility] [address] [place], [address]   ENDOSCOPY PROCEDURE REPORT  PATIENT: Lisa Odom, Lisa Odom  MR#: [id number] BIRTHDATE: May 07, 1956 , 56  yrs. old GENDER: Female ENDOSCOPIST: Hart Carwin, MD REFERRED BY:  Excell Seltzer, M.D. PROCEDURE DATE:  10/19/2012 PROCEDURE:  EGD w/ biopsy ASA CLASS:     Class I INDICATIONS:  Nausea.   Vomiting.   Dyspepsia.   duodenal ulcer age 57,EGD 2008- no ulcer. MEDICATIONS: MAC sedation, administered by CRNA and propofol (Diprivan) 200mg  IV TOPICAL ANESTHETIC: Cetacaine Spray  DESCRIPTION OF PROCEDURE: After the risks benefits and alternatives of the procedure were thoroughly explained, informed consent was obtained.  The [facility] [id number] [id number] endoscope was introduced through the mouth and advanced to the second portion of the duodenum. Without limitations.  The instrument was slowly withdrawn as the mucosa was fully examined.      The upper, middle and distal third of the esophagus were carefully inspected and no abnormalities were noted.  The z-line was well seen at the GEJ. There was a small reducible hiatal hernia  The endoscope was pushed into the fundus which was normal including a retroflexed view.  The antrum, gastric body, first and second part of the duodenum were unremarkable.  Retroflexed views revealed no abnormalities.  Biopsy was taken from gastric anreum to r/o H.pylori.   The scope was then withdrawn from the patient and the procedure completed.  COMPLICATIONS: There were no complications. ENDOSCOPIC IMPRESSION: Normal EGD s/p antral biopsies for CLO test  RECOMMENDATIONS: 1.  Await biopsy results 2.  Continue PPI nothing to account for N&V, treat symptomatically  REPEAT EXAM: .  no recall  eSigned:  Hart Carwin, MD 10/19/2012 12:07 PM   CC:  PATIENT NAME:  Lisa Odom, Lisa Odom MR#: [id number]

## 2012-10-19 NOTE — Progress Notes (Signed)
Patient did not experience any of the following events: a burn prior to discharge; a fall within the facility; wrong site/side/patient/procedure/implant event; or a hospital transfer or hospital admission upon discharge from the facility. (G8907) Patient did not have preoperative order for IV antibiotic SSI prophylaxis. (G8918)  

## 2012-10-19 NOTE — Progress Notes (Signed)
VSS, A&O X3, Pleased with MAC. Report to Ardeen Jourdain RN

## 2012-10-20 ENCOUNTER — Telehealth: Payer: Self-pay | Admitting: *Deleted

## 2012-10-20 NOTE — Telephone Encounter (Signed)
  Follow up Call-  Call back number 10/19/2012  Post procedure Call Back phone  # [phone]  Permission to leave phone message Yes     Patient questions:  Do you have a fever, pain , or abdominal swelling? no Pain Score  0 *  Have you tolerated food without any problems? yes  Have you been able to return to your normal activities? yes  Do you have any questions about your discharge instructions: Diet   no Medications  no Follow up visit  no  Do you have questions or concerns about your Care? no  Actions: * If pain score is 4 or above: No action needed, pain <4.

## 2012-11-13 ENCOUNTER — Other Ambulatory Visit: Payer: Self-pay

## 2012-11-18 ENCOUNTER — Telehealth: Payer: Self-pay | Admitting: Family Medicine

## 2012-11-18 NOTE — Telephone Encounter (Signed)
Patient is coming in for her cpx on 11/30/12.  Patient has her lab work done at [facility].  Please mail lab order to patient.

## 2012-11-19 NOTE — Telephone Encounter (Signed)
rx mailed

## 2012-11-19 NOTE — Telephone Encounter (Signed)
rx in outbox

## 2012-11-26 ENCOUNTER — Encounter: Payer: Self-pay | Admitting: Family Medicine

## 2012-11-27 LAB — HM DIABETES EYE EXAM

## 2012-11-30 ENCOUNTER — Encounter: Payer: Self-pay | Admitting: Family Medicine

## 2012-11-30 ENCOUNTER — Ambulatory Visit (INDEPENDENT_AMBULATORY_CARE_PROVIDER_SITE_OTHER): Payer: 59 | Admitting: Family Medicine

## 2012-11-30 VITALS — BP 130/90 | HR 98 | Temp 97.8°F | Ht 62.0 in | Wt 166.5 lb

## 2012-11-30 DIAGNOSIS — F329 Major depressive disorder, single episode, unspecified: Secondary | ICD-10-CM

## 2012-11-30 DIAGNOSIS — M79604 Pain in right leg: Secondary | ICD-10-CM | POA: Insufficient documentation

## 2012-11-30 DIAGNOSIS — R7309 Other abnormal glucose: Secondary | ICD-10-CM

## 2012-11-30 DIAGNOSIS — F410 Panic disorder [episodic paroxysmal anxiety] without agoraphobia: Secondary | ICD-10-CM

## 2012-11-30 DIAGNOSIS — G5602 Carpal tunnel syndrome, left upper limb: Secondary | ICD-10-CM

## 2012-11-30 DIAGNOSIS — E7800 Pure hypercholesterolemia, unspecified: Secondary | ICD-10-CM

## 2012-11-30 DIAGNOSIS — G5600 Carpal tunnel syndrome, unspecified upper limb: Secondary | ICD-10-CM

## 2012-11-30 DIAGNOSIS — Z0000 Encounter for general adult medical examination without abnormal findings: Secondary | ICD-10-CM

## 2012-11-30 DIAGNOSIS — I10 Essential (primary) hypertension: Secondary | ICD-10-CM

## 2012-11-30 DIAGNOSIS — M79609 Pain in unspecified limb: Secondary | ICD-10-CM

## 2012-11-30 MED ORDER — ROPINIROLE HCL 1 MG PO TABS: ORAL_TABLET | ORAL | Status: DC

## 2012-11-30 NOTE — Patient Instructions (Addendum)
Stop at front desk to set up referral appt. Start wearing carpal tunnel brace on left wrist at bedtime. When feeling better get back on track with healthy eating , weight loss and exercise.  Trial of medicaiton for restless leg syndrome.

## 2012-11-30 NOTE — Assessment & Plan Note (Signed)
Inadequate control. Encouraged exercise, weight loss, healthy eating habits.  

## 2012-11-30 NOTE — Assessment & Plan Note (Signed)
Borderline control likely due to anxiety and depression. Will follow.

## 2012-11-30 NOTE — Assessment & Plan Note (Signed)
Very poor control... ASAP referral to [facility]. Vaium prn and cymbalta for now.

## 2012-11-30 NOTE — Progress Notes (Signed)
Subjective:    Patient ID: Lisa Odom, female    DOB: 11-17-1955, 57 y.o.   MRN: [id number]  HPI  The patient is here for annual wellness exam and preventative care.   Depression: Having poor control. Fluoxetine did not help, has been on cymbalta for 1 year without help. Poor sleep at night.  Using diazepam as needed.. Using rarely because it makes you groggy.  Having trouble with motivation and getting out of bed in mornings She did have an episode of SI few weeks ago, talked to minister, no further problems but she feels very low.  Occ heard someone calling her.. No other hallucinations.  Lower leg burning B x 1 months. No burning in feet. Wakes her up at night.  no numbness, no weakness. No low back pain. Feels better with movement. Feels better if she does not lie down to go to sleep. No leg cramping.    Occ left hand going numb. Up to shoulder, has to shake hand to help. Notes after on computer or when wakes up. Has history of carpal tunnel in right hand s/p surgery.  Elevated Cholesterol: LDL 139, not at goal <130,  Diet compliance:Poor Exercise:None Other complaints:   Prediabetes: worsened.  History of BP elevation.. no diagnosis...she has noted BPs more consistently elevated at home and at work.  2 lb weight gain.  Started Yoga. Treadmill 3 times a week.  Has been eating healthy foods...fruits and veggies.  Strong family history of HTN.  Migraines.. well controlled on topamax 100 daily.Marland Kitchen occuring once a month. Using amerge once a month.   Diagnosed with spastic colon by Dr. Juanda Chance: bentyl helps with symptoms.    Review of Systems  Constitutional: Negative for fever, fatigue and unexpected weight change.  HENT: Negative for ear pain, congestion, sore throat, sneezing, trouble swallowing and sinus pressure.   Eyes: Negative for pain and itching.  Respiratory: Negative for cough, shortness of breath and wheezing.   Cardiovascular: Negative for chest pain,  palpitations and leg swelling.  Gastrointestinal: Negative for nausea, abdominal pain, diarrhea, constipation and blood in stool.  Genitourinary: Negative for dysuria, hematuria, vaginal discharge, difficulty urinating and menstrual problem.  Skin: Negative for rash.  Neurological: Negative for syncope, weakness, light-headedness, numbness and headaches.  Psychiatric/Behavioral: Positive for behavioral problems, sleep disturbance and decreased concentration. Negative for confusion and dysphoric mood. The patient is not nervous/anxious.        Objective:   Physical Exam  Constitutional: Vital signs are normal. She appears well-developed and well-nourished. She is cooperative.  Non-toxic appearance. She does not appear ill. No distress.  HENT:  Head: Normocephalic.  Right Ear: Hearing, tympanic membrane, external ear and ear canal normal.  Left Ear: Hearing, tympanic membrane, external ear and ear canal normal.  Nose: Nose normal.  Eyes: Conjunctivae, EOM and lids are normal. Pupils are equal, round, and reactive to light. No foreign bodies found.  Neck: Trachea normal and normal range of motion. Neck supple. Carotid bruit is not present. No mass and no thyromegaly present.  Cardiovascular: Normal rate, regular rhythm, S1 normal, S2 normal, normal heart sounds and intact distal pulses.  Exam reveals no gallop.   No murmur heard. Pulmonary/Chest: Effort normal and breath sounds normal. No respiratory distress. She has no wheezes. She has no rhonchi. She has no rales.  Abdominal: Soft. Normal appearance and bowel sounds are normal. She exhibits no distension, no fluid wave, no abdominal bruit and no mass. There is no hepatosplenomegaly. There is no tenderness.  There is no rebound, no guarding and no CVA tenderness. No hernia.  Genitourinary: Vagina normal and uterus normal. No breast swelling, tenderness, discharge or bleeding. Pelvic exam was performed with patient prone. There is no rash,  tenderness or lesion on the right labia. There is no rash, tenderness or lesion on the left labia. Uterus is not enlarged and not tender. Right adnexum displays no mass, no tenderness and no fullness. Left adnexum displays no mass, no tenderness and no fullness.  Musculoskeletal:       Cervical back: She exhibits normal range of motion, no tenderness and no bony tenderness.  Neg spurling's B  Lymphadenopathy:    She has no cervical adenopathy.    She has no axillary adenopathy.  Neurological: She is alert. She has normal strength and normal reflexes. She displays no atrophy and no tremor. No cranial nerve deficit or sensory deficit. She exhibits normal muscle tone. She displays a negative Romberg sign. Coordination and gait normal.  Skin: Skin is warm, dry and intact. No rash noted.  Psychiatric: Her speech is normal. Judgment normal. Her mood appears not anxious. Her affect is inappropriate. She is slowed and withdrawn. Cognition and memory are impaired. She exhibits a depressed mood. She expresses suicidal ideation. She expresses no homicidal ideation. She expresses no suicidal plans and no homicidal plans.  tearful          Assessment & Plan:  The patient's preventative maintenance and recommended screening tests for an annual wellness exam were reviewed in full today. Brought up to date unless services declined.  Counselled on the importance of diet, exercise, and its role in overall health and mortality. The patient's FH and SH was reviewed, including their home life, tobacco status, and drug and alcohol status.   Vaccines:uptodate with Td Nonsmoker Colon: 10/19/12 Juanda Chance, repeat in 2024  PAP/DVE: last pap 2012, DVE this year, next pap in 2015, on q3 year schedule DEXA: start age 47. Mammo: 11/26/12 nml

## 2012-11-30 NOTE — Assessment & Plan Note (Signed)
Most consistent with RLS... Will try trial of requip.

## 2012-11-30 NOTE — Assessment & Plan Note (Signed)
Lower carb diet. Continue to follow.

## 2012-11-30 NOTE — Assessment & Plan Note (Signed)
Wear left brace at bedtime.

## 2012-12-15 ENCOUNTER — Other Ambulatory Visit: Payer: Self-pay | Admitting: Family Medicine

## 2013-02-22 ENCOUNTER — Ambulatory Visit (INDEPENDENT_AMBULATORY_CARE_PROVIDER_SITE_OTHER): Payer: 59 | Admitting: Family Medicine

## 2013-02-22 ENCOUNTER — Encounter: Payer: Self-pay | Admitting: Family Medicine

## 2013-02-22 VITALS — BP 120/72 | HR 83 | Temp 97.6°F | Ht 62.0 in | Wt 164.8 lb

## 2013-02-22 DIAGNOSIS — R2230 Localized swelling, mass and lump, unspecified upper limb: Secondary | ICD-10-CM | POA: Insufficient documentation

## 2013-02-22 DIAGNOSIS — M79609 Pain in unspecified limb: Secondary | ICD-10-CM

## 2013-02-22 DIAGNOSIS — M79672 Pain in left foot: Secondary | ICD-10-CM | POA: Insufficient documentation

## 2013-02-22 DIAGNOSIS — R2231 Localized swelling, mass and lump, right upper limb: Secondary | ICD-10-CM

## 2013-02-22 DIAGNOSIS — M79671 Pain in right foot: Secondary | ICD-10-CM | POA: Insufficient documentation

## 2013-02-22 DIAGNOSIS — M65341 Trigger finger, right ring finger: Secondary | ICD-10-CM

## 2013-02-22 DIAGNOSIS — R229 Localized swelling, mass and lump, unspecified: Secondary | ICD-10-CM

## 2013-02-22 DIAGNOSIS — M6530 Trigger finger, unspecified finger: Secondary | ICD-10-CM

## 2013-02-22 MED ORDER — DICLOFENAC SODIUM 75 MG PO TBEC: 75.0000 mg | DELAYED_RELEASE_TABLET | Freq: Two times a day (BID) | ORAL | Status: DC

## 2013-02-22 NOTE — Assessment & Plan Note (Signed)
Refer to hand specialist for likely steroid injection.

## 2013-02-22 NOTE — Progress Notes (Signed)
Subjective:    Patient ID: Lisa Odom, female    DOB: 10-27-1955, 57 y.o.   MRN: [id number]  HPI  57 year old female presents for evaluation of a knot on her 3rd finger on right hand at DIP joint. She noted it gradauly increasing isn size in last year. Slightly sore with writing... She is not sure if writing has caused it.  Also on right 4th digit locks in position flexed... Ongoing in last 6 weeks. Every morning when she wakes up she ha to trigger it to open it.  NMildly tender at MCP and PIP joint.  No injury to fingers/hands.  Finally she has noted tenderness in B feet over lateral dorsal feet .  Same on both feet.  Started on right then spread to left. Ongoing in last 4 months  If sheets touch them it hurts, throbbing... Makes it difficult to go to bed.  No redness or swelling.  No change in activity. No relief with ibuprofen or acetominophen..  No known injury.   No family history of gout. She eats a lot of fish    Has seen hand specialist in past for carpal tunnel.       Review of Systems  Constitutional: Positive for fatigue. Negative for fever and unexpected weight change.       Full body weakness and exhaustion at times  HENT: Negative for ear pain, congestion, sore throat, sneezing, trouble swallowing and sinus pressure.   Eyes: Negative for pain and itching.  Respiratory: Negative for cough, shortness of breath and wheezing.   Cardiovascular: Negative for chest pain, palpitations and leg swelling.  Gastrointestinal: Negative for nausea, abdominal pain, diarrhea, constipation and blood in stool.  Genitourinary: Negative for dysuria, hematuria, vaginal discharge, difficulty urinating and menstrual problem.  Skin: Negative for rash.  Neurological: Negative for syncope, weakness, light-headedness, numbness and headaches.  Psychiatric/Behavioral: Negative for confusion and dysphoric mood. The patient is not nervous/anxious.        Objective:   Physical Exam   Constitutional: Vital signs are normal. She appears well-developed and well-nourished. She is cooperative.  Non-toxic appearance. She does not appear ill. No distress.  HENT:  Head: Normocephalic.  Right Ear: Hearing, tympanic membrane, external ear and ear canal normal. Tympanic membrane is not erythematous, not retracted and not bulging.  Left Ear: Hearing, tympanic membrane, external ear and ear canal normal. Tympanic membrane is not erythematous, not retracted and not bulging.  Nose: No mucosal edema or rhinorrhea. Right sinus exhibits no maxillary sinus tenderness and no frontal sinus tenderness. Left sinus exhibits no maxillary sinus tenderness and no frontal sinus tenderness.  Mouth/Throat: Uvula is midline, oropharynx is clear and moist and mucous membranes are normal.  Eyes: Conjunctivae, EOM and lids are normal. Pupils are equal, round, and reactive to light. No foreign bodies found.  Neck: Trachea normal and normal range of motion. Neck supple. Carotid bruit is not present. No mass and no thyromegaly present.  Cardiovascular: Normal rate, regular rhythm, S1 normal, S2 normal, normal heart sounds, intact distal pulses and normal pulses.  Exam reveals no gallop and no friction rub.   No murmur heard. Pulmonary/Chest: Effort normal and breath sounds normal. Not tachypneic. No respiratory distress. She has no decreased breath sounds. She has no wheezes. She has no rhonchi. She has no rales.  Abdominal: Soft. Normal appearance and bowel sounds are normal. There is no tenderness.  Musculoskeletal:       Right ankle: She exhibits decreased range of motion. She  exhibits no swelling. Tenderness. Lateral malleolus tenderness found. No medial malleolus, no AITFL, no CF ligament, no posterior TFL, no head of 5th metatarsal and no proximal fibula tenderness found. Achilles tendon normal. Achilles tendon exhibits no pain, no defect and normal Thompson's test results.       Left ankle: She exhibits  normal range of motion, no swelling and no deformity. Tenderness. Lateral malleolus tenderness found. No medial malleolus, no AITFL, no CF ligament, no posterior TFL, no head of 5th metatarsal and no proximal fibula tenderness found. Achilles tendon normal. Achilles tendon exhibits no pain.  Nodule erythematous at DIP 3rd , full ROM.  ttp at palmar aspect of base of 4th MCP, nodule present, finger triggering.  Neurological: She is alert.  Skin: Skin is warm, dry and intact. No rash noted.  Psychiatric: Her speech is normal and behavior is normal. Judgment and thought content normal. Her mood appears not anxious. Cognition and memory are normal. She does not exhibit a depressed mood.          Assessment & Plan:

## 2013-02-22 NOTE — Assessment & Plan Note (Signed)
Likely cyst vs heberden's OA nodule.. No treatment needed given minimally tender but she can discuss with hand specialist.

## 2013-02-22 NOTE — Patient Instructions (Addendum)
Gentle B foot stretching per instructions. Diclofenac twice daily for inflammation and pain. Ice lateral feet and ankles. Wear supportive higher ankle shoes. Follow up foot pain in 2 weeks (30 MIN OV) if not resolved. We can also review any other concerns you have at that time.  Stop at frnt desk for referral to hand specialist.

## 2013-02-22 NOTE — Assessment & Plan Note (Signed)
Likely foot sprain. No clear indication for X-ray at this time.  NSAIDs, ICE and gentle stretching.  Will eval with labs to r/o gout.

## 2013-02-22 NOTE — Addendum Note (Signed)
Addended by: Alvina Chou on: 02/22/2013 10:08 AM   Modules accepted: Orders

## 2013-02-23 ENCOUNTER — Encounter: Payer: Self-pay | Admitting: Family Medicine

## 2013-02-24 ENCOUNTER — Encounter: Payer: Self-pay | Admitting: *Deleted

## 2013-03-01 ENCOUNTER — Ambulatory Visit: Payer: 59 | Admitting: Family Medicine

## 2013-03-14 ENCOUNTER — Other Ambulatory Visit: Payer: Self-pay | Admitting: Orthopedic Surgery

## 2013-03-18 ENCOUNTER — Encounter (HOSPITAL_BASED_OUTPATIENT_CLINIC_OR_DEPARTMENT_OTHER): Payer: Self-pay | Admitting: *Deleted

## 2013-03-18 NOTE — Progress Notes (Signed)
No labs needed

## 2013-03-23 ENCOUNTER — Ambulatory Visit (HOSPITAL_BASED_OUTPATIENT_CLINIC_OR_DEPARTMENT_OTHER): Payer: 59 | Admitting: Anesthesiology

## 2013-03-23 ENCOUNTER — Encounter (HOSPITAL_BASED_OUTPATIENT_CLINIC_OR_DEPARTMENT_OTHER): Payer: Self-pay | Admitting: Anesthesiology

## 2013-03-23 ENCOUNTER — Encounter (HOSPITAL_BASED_OUTPATIENT_CLINIC_OR_DEPARTMENT_OTHER): Payer: Self-pay | Admitting: Orthopedic Surgery

## 2013-03-23 ENCOUNTER — Encounter (HOSPITAL_BASED_OUTPATIENT_CLINIC_OR_DEPARTMENT_OTHER)
Admission: RE | Disposition: A | Discharge status: Home / Self Care | Payer: Self-pay | Source: Ambulatory Visit | Attending: Orthopedic Surgery

## 2013-03-23 ENCOUNTER — Ambulatory Visit (HOSPITAL_BASED_OUTPATIENT_CLINIC_OR_DEPARTMENT_OTHER)
Admission: RE | Admit: 2013-03-23 | Discharge: 2013-03-23 | Disposition: A | Discharge status: Home / Self Care | Payer: 59 | Source: Ambulatory Visit | Attending: Orthopedic Surgery | Admitting: Orthopedic Surgery

## 2013-03-23 DIAGNOSIS — M19049 Primary osteoarthritis, unspecified hand: Secondary | ICD-10-CM | POA: Insufficient documentation

## 2013-03-23 DIAGNOSIS — M6530 Trigger finger, unspecified finger: Secondary | ICD-10-CM | POA: Insufficient documentation

## 2013-03-23 DIAGNOSIS — F411 Generalized anxiety disorder: Secondary | ICD-10-CM | POA: Insufficient documentation

## 2013-03-23 DIAGNOSIS — K219 Gastro-esophageal reflux disease without esophagitis: Secondary | ICD-10-CM | POA: Insufficient documentation

## 2013-03-23 DIAGNOSIS — Z8711 Personal history of peptic ulcer disease: Secondary | ICD-10-CM | POA: Insufficient documentation

## 2013-03-23 DIAGNOSIS — F3289 Other specified depressive episodes: Secondary | ICD-10-CM | POA: Insufficient documentation

## 2013-03-23 DIAGNOSIS — G43909 Migraine, unspecified, not intractable, without status migrainosus: Secondary | ICD-10-CM | POA: Insufficient documentation

## 2013-03-23 DIAGNOSIS — K5730 Diverticulosis of large intestine without perforation or abscess without bleeding: Secondary | ICD-10-CM | POA: Insufficient documentation

## 2013-03-23 DIAGNOSIS — Z888 Allergy status to other drugs, medicaments and biological substances status: Secondary | ICD-10-CM | POA: Insufficient documentation

## 2013-03-23 DIAGNOSIS — F329 Major depressive disorder, single episode, unspecified: Secondary | ICD-10-CM | POA: Insufficient documentation

## 2013-03-23 DIAGNOSIS — Z882 Allergy status to sulfonamides status: Secondary | ICD-10-CM | POA: Insufficient documentation

## 2013-03-23 DIAGNOSIS — D2110 Benign neoplasm of connective and other soft tissue of unspecified upper limb, including shoulder: Secondary | ICD-10-CM | POA: Insufficient documentation

## 2013-03-23 HISTORY — PX: TRIGGER FINGER RELEASE: SHX641

## 2013-03-23 HISTORY — DX: Encounter for fitting and adjustment of spectacles and contact lenses: Z46.0

## 2013-03-23 LAB — POCT HEMOGLOBIN-HEMACUE: Hemoglobin: 13.2 g/dL (ref 12.0–15.0)

## 2013-03-23 SURGERY — RELEASE, A1 PULLEY, FOR TRIGGER FINGER
Anesthesia: Monitor Anesthesia Care | Site: Hand | Laterality: Right | Wound class: Clean

## 2013-03-23 MED ORDER — OXYCODONE HCL 5 MG/5ML PO SOLN: 5.0000 mg | Freq: Once | ORAL | Status: DC | PRN

## 2013-03-23 MED ORDER — MIDAZOLAM HCL 5 MG/5ML IJ SOLN
INTRAMUSCULAR | Status: DC | PRN
  Administered 2013-03-23: 2 mg via INTRAVENOUS

## 2013-03-23 MED ORDER — MIDAZOLAM HCL 2 MG/2ML IJ SOLN: 0.5000 mg | Freq: Once | INTRAMUSCULAR | Status: DC | PRN

## 2013-03-23 MED ORDER — BUPIVACAINE HCL (PF) 0.25 % IJ SOLN
INTRAMUSCULAR | Status: DC | PRN
  Administered 2013-03-23: 7 mL

## 2013-03-23 MED ORDER — ONDANSETRON HCL 4 MG/2ML IJ SOLN
INTRAMUSCULAR | Status: DC | PRN
  Administered 2013-03-23: 4 mg via INTRAVENOUS

## 2013-03-23 MED ORDER — OXYCODONE HCL 5 MG PO TABS: 5.0000 mg | ORAL_TABLET | Freq: Once | ORAL | Status: DC | PRN

## 2013-03-23 MED ORDER — MIDAZOLAM HCL 2 MG/2ML IJ SOLN: 1.0000 mg | INTRAMUSCULAR | Status: DC | PRN

## 2013-03-23 MED ORDER — LIDOCAINE HCL (PF) 0.5 % IJ SOLN
INTRAMUSCULAR | Status: DC | PRN
  Administered 2013-03-23: 30 mL via INTRAVENOUS

## 2013-03-23 MED ORDER — PROPOFOL 10 MG/ML IV BOLUS
INTRAVENOUS | Status: DC | PRN
  Administered 2013-03-23: 20 mg via INTRAVENOUS

## 2013-03-23 MED ORDER — FENTANYL CITRATE 0.05 MG/ML IJ SOLN: 25.0000 ug | INTRAMUSCULAR | Status: DC | PRN

## 2013-03-23 MED ORDER — CHLORHEXIDINE GLUCONATE 4 % EX LIQD: 60.0000 mL | Freq: Once | CUTANEOUS | Status: DC

## 2013-03-23 MED ORDER — CEFAZOLIN SODIUM-DEXTROSE 2-3 GM-% IV SOLR
2.0000 g | INTRAVENOUS | Status: AC
  Administered 2013-03-23: 2 g via INTRAVENOUS

## 2013-03-23 MED ORDER — FENTANYL CITRATE 0.05 MG/ML IJ SOLN
INTRAMUSCULAR | Status: DC | PRN
  Administered 2013-03-23: 100 ug via INTRAVENOUS

## 2013-03-23 MED ORDER — LACTATED RINGERS IV SOLN
INTRAVENOUS | Status: DC
  Administered 2013-03-23: 10:00:00 via INTRAVENOUS

## 2013-03-23 MED ORDER — HYDROCODONE-ACETAMINOPHEN 5-325 MG PO TABS: 1.0000 | ORAL_TABLET | Freq: Four times a day (QID) | ORAL | Status: DC | PRN

## 2013-03-23 MED ORDER — FENTANYL CITRATE 0.05 MG/ML IJ SOLN: 50.0000 ug | INTRAMUSCULAR | Status: DC | PRN

## 2013-03-23 MED ORDER — PROPOFOL INFUSION 10 MG/ML OPTIME
INTRAVENOUS | Status: DC | PRN
  Administered 2013-03-23: 100 ug/kg/min via INTRAVENOUS

## 2013-03-23 MED ORDER — PROMETHAZINE HCL 25 MG/ML IJ SOLN: 6.2500 mg | INTRAMUSCULAR | Status: DC | PRN

## 2013-03-23 MED ORDER — MEPERIDINE HCL 25 MG/ML IJ SOLN: 6.2500 mg | INTRAMUSCULAR | Status: DC | PRN

## 2013-03-23 SURGICAL SUPPLY — 49 items
BANDAGE COBAN STERILE 2 (GAUZE/BANDAGES/DRESSINGS) ×2 IMPLANT
BANDAGE GAUZE ELAST BULKY 4 IN (GAUZE/BANDAGES/DRESSINGS) IMPLANT
BLADE MINI RND TIP GREEN BEAV (BLADE) IMPLANT
BLADE SURG 15 STRL LF DISP TIS (BLADE) ×1 IMPLANT
BLADE SURG 15 STRL SS (BLADE) ×1
BNDG COHESIVE 1X5 TAN STRL LF (GAUZE/BANDAGES/DRESSINGS) ×2 IMPLANT
BNDG COHESIVE 3X5 TAN STRL LF (GAUZE/BANDAGES/DRESSINGS) IMPLANT
BNDG ESMARK 4X9 LF (GAUZE/BANDAGES/DRESSINGS) IMPLANT
CHLORAPREP W/TINT 26ML (MISCELLANEOUS) ×2 IMPLANT
CLOTH BEACON ORANGE TIMEOUT ST (SAFETY) ×2 IMPLANT
CORDS BIPOLAR (ELECTRODE) ×2 IMPLANT
COVER MAYO STAND STRL (DRAPES) ×2 IMPLANT
COVER TABLE BACK 60X90 (DRAPES) ×2 IMPLANT
CUFF TOURNIQUET SINGLE 18IN (TOURNIQUET CUFF) IMPLANT
DECANTER SPIKE VIAL GLASS SM (MISCELLANEOUS) IMPLANT
DRAIN PENROSE 1/2X12 LTX STRL (WOUND CARE) IMPLANT
DRAPE EXTREMITY T 121X128X90 (DRAPE) ×2 IMPLANT
DRAPE SURG 17X23 STRL (DRAPES) ×2 IMPLANT
GAUZE XEROFORM 1X8 LF (GAUZE/BANDAGES/DRESSINGS) ×2 IMPLANT
GLOVE BIO SURGEON STRL SZ 6.5 (GLOVE) ×2 IMPLANT
GLOVE BIOGEL PI IND STRL 8.5 (GLOVE) ×1 IMPLANT
GLOVE BIOGEL PI INDICATOR 8.5 (GLOVE) ×1
GLOVE SURG ORTHO 8.0 STRL STRW (GLOVE) ×2 IMPLANT
GOWN BRE IMP PREV XXLGXLNG (GOWN DISPOSABLE) ×2 IMPLANT
GOWN PREVENTION PLUS XLARGE (GOWN DISPOSABLE) ×2 IMPLANT
NDL SAFETY ECLIPSE 18X1.5 (NEEDLE) IMPLANT
NEEDLE 27GAX1X1/2 (NEEDLE) ×2 IMPLANT
NEEDLE HYPO 18GX1.5 SHARP (NEEDLE)
NS IRRIG 1000ML POUR BTL (IV SOLUTION) ×2 IMPLANT
PACK BASIN DAY SURGERY FS (CUSTOM PROCEDURE TRAY) ×2 IMPLANT
PAD CAST 3X4 CTTN HI CHSV (CAST SUPPLIES) IMPLANT
PADDING CAST ABS 3INX4YD NS (CAST SUPPLIES)
PADDING CAST ABS 4INX4YD NS (CAST SUPPLIES) ×1
PADDING CAST ABS COTTON 3X4 (CAST SUPPLIES) IMPLANT
PADDING CAST ABS COTTON 4X4 ST (CAST SUPPLIES) ×1 IMPLANT
PADDING CAST COTTON 3X4 STRL (CAST SUPPLIES)
SPLINT FNGR PLAIN END 5/8X3.25 (CAST SUPPLIES) ×1 IMPLANT
SPLINT PLASTALUME 3 1/4 (CAST SUPPLIES) ×2
SPLINT PLASTER CAST XFAST 3X15 (CAST SUPPLIES) IMPLANT
SPLINT PLASTER XTRA FASTSET 3X (CAST SUPPLIES)
SPONGE GAUZE 4X4 12PLY (GAUZE/BANDAGES/DRESSINGS) ×2 IMPLANT
STOCKINETTE 4X48 STRL (DRAPES) ×2 IMPLANT
SUT VIC AB 4-0 P2 18 (SUTURE) IMPLANT
SUT VICRYL RAPID 5 0 P 3 (SUTURE) IMPLANT
SUT VICRYL RAPIDE 4/0 PS 2 (SUTURE) ×2 IMPLANT
SYR BULB 3OZ (MISCELLANEOUS) ×2 IMPLANT
SYR CONTROL 10ML LL (SYRINGE) ×2 IMPLANT
TOWEL OR 17X24 6PK STRL BLUE (TOWEL DISPOSABLE) ×4 IMPLANT
UNDERPAD 30X30 INCONTINENT (UNDERPADS AND DIAPERS) ×2 IMPLANT

## 2013-03-23 NOTE — H&P (Signed)
Lisa Odom returns today. She is 57 and right handed. She is s/p carpal tunnel release done  8 years ago. She is complaining of a mass over her right middle finger DIP joint for the past several months that is enlarging and getting smaller. She recalls no history of injury. She is also complaining of catching of her right ring finger. This has been going on for approximately 6 weeks. No history of diabetes, thyroid problems, arthritis or gout. She does have a family history of diabetes and arthritis. She complains of an intermittent stabbing, throbbing pain on her middle finger DIP joint. She has occasional catching of her ring finger. She does have occasional numbness and tingling. She has had a carpal tunnel release done on that side. It does not awaken her at night.  PAST MEDICAL HISTORY: She is allergic to Sulfa. She is on Diclofenac 75,Fuloxetine 200 and Pantoprazole 40 mg. She has had carpal tunnel release right side, right rotator cuff repair.  FAMILY H ISTORY: Positive for diabetes, heart disease, high BP and arthritis.  SOCIAL HISTORY: She does not smoke. She drinks socially. She is divorced and a Merchandiser, retail.  REVIEW OF SYSTEMS: Positive for glasses, contacts, pneumonia, stomach ulcer, headaches, depression, otherwise negative for 14 points. Lisa Odom is an 57 y.o. female.   Chief Complaint: mucoid cyst rmf sts rrf HPI: see above  Past Medical History  Diagnosis Date  . Diverticulosis   . Hiatal hernia   . Gastritis   . Migraine   . Depression   . Anxiety   . GERD (gastroesophageal reflux disease)   . Contact lens/glasses fitting     wears contacts or glasses    Past Surgical History  Procedure Laterality Date  . Shoulder arthroscopy with rotator cuff repair  2010    right  . Carpal tunnel release  2002    right  . Tubal ligation    . Colonoscopy    . Upper gi endoscopy      Family History  Problem Relation Age of Onset  . Heart disease Brother   . Ovarian  cancer Cousin   . Breast cancer Maternal Grandmother     grandmother  . Diabetes Maternal Aunt     aunt  . Diabetes Sister   . Alcohol abuse Maternal Uncle     uncle  . Diabetes Paternal Aunt   . Alcohol abuse Paternal Uncle    Social History:  reports that she has never smoked. She has never used smokeless tobacco. She reports that  drinks alcohol. She reports that she does not use illicit drugs.  Allergies:  Allergies  Allergen Reactions  . Sulfonamide Derivatives     REACTION: Rash  . Hydrochlorothiazide Rash    No prescriptions prior to admission    No results found for this or any previous visit (from the past 48 hour(s)).  No results found.   Pertinent items are noted in HPI.  Height 5\' 2"  (1.575 m), weight 74.39 kg (164 lb).  General appearance: alert, cooperative and appears stated age Head: Normocephalic, without obvious abnormality Neck: no JVD Resp: clear to auscultation bilaterally Cardio: regular rate and rhythm, S1, S2 normal, no murmur, click, rub or gallop GI: soft, non-tender; bowel sounds normal; no masses,  no organomegaly Extremities: extremities normal, atraumatic, no cyanosis or edema Pulses: 2+ and symmetric Skin: Skin color, texture, turgor normal. No rashes or lesions Neurologic: Grossly normal Incision/Wound: na  Assessment/Plan X-rays reveal mild degenerative changes at the DIP joint of  the middle finger, otherwise negative.  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   We have discussed the necessity of the excision of the cyst with debridement of the joint. We have offered 2 injections to the stenosing tenosynovitis. She is advised should this not resolve this for her then release will be necessary. She would like to go ahead and have the 2 taken care of surgically. The pre, peri and post op course are discussed along with risks and complications.  She is aware  there is no guarantee with surgery, possibility of infection, recurrence, injury to arteries, nerves and tendons, incomplete relief of symptoms and dystrophy.  She is scheduled for excision mucoid cyst right middle finger with debridement DIP joint along with release A-1 pulley right ring finger as an outpatient under regional anesthesia.   Lisa Odom 03/23/2013, 9:00 AM

## 2013-03-23 NOTE — Op Note (Signed)
Lisa Odom, Lisa Odom                 ACCOUNT NO.:  [id number]  MEDICAL RECORD NO.:  [id number]  LOCATION:                                 FACILITY:  PHYSICIAN:  Cindee Salt, M.D.            DATE OF BIRTH:  DATE OF PROCEDURE:  03/23/2013 DATE OF DISCHARGE:                              OPERATIVE REPORT   PREOPERATIVE DIAGNOSES:  Stenosing tenosynovitis, right ring finger with mucoid tumor, degenerative arthritis, distal interphalangeal joint, right middle finger.  POSTOPERATIVE DIAGNOSES:  Stenosing tenosynovitis, right ring finger with mucoid tumor, degenerative arthritis, distal interphalangeal joint, right middle finger.  OPERATION:  Release A1 pulley, right ring finger with excision cyst, debridement distal interphalangeal joint, right middle finger.  SURGEON:  Cindee Salt, MD  ANESTHESIA:  Forearm-based IV regional with metacarpal block.  ANESTHESIOLOGIST:  Dr. Jean Rosenthal.  HISTORY:  The patient is a 57 year old female with a history of a mass on the dorsal aspect of the distal interphalangeal joint of her right middle finger with triggering of her right ring finger, not responsive to conservative treatment.  She has elected to undergo release of the A1 pulley along with excision of the cyst debridement of the distal interphalangeal joint of the right middle finger.  She is aware of risks and complications including infection; recurrence of injury to arteries, nerves, tendons; incomplete relief of symptoms and dystrophy.  In the preoperative area, the patient was seen, and the extremity was marked by both patient and surgeon.  Antibiotic given.  DESCRIPTION OF PROCEDURE:  The patient was brought to the operating room where forearm-based IV regional anesthetic was carried out without difficulty.  She was prepped using ChloraPrep in supine position with the right arm free.  A 3-minute dry time was allowed.  Time-out taken confirming the patient and procedure.  The ring finger  was attended to first.  An oblique incision was made over the A1 pulley of the right ring finger, carried down through subcutaneous tissue.  Bleeders were electrocauterized with bipolar.  The A1 pulley was identified.  This was released on its radial aspect.  Small incision was made centrally in A2. A partial tenosynovectomy performed proximally.  The finger placed through full range motion.  No further triggering was noted.  The wound was irrigated and closed with interrupted 4-0 Vicryl Rapide sutures.  A local infiltration with 0.25% Marcaine without epinephrine was given along with a metacarpal block to the middle finger.  The middle finger was attended to next.  A curvilinear incision was made over the distal interphalangeal joint mass on the middle finger, carried down through subcutaneous tissue.  Bleeders again electrocauterized.  A cyst was immediately encountered over right radial aspect of the extensor tendon. This was excised.  The joint opened.  A synovectomy performed along with removal of exostoses from the middle phalanx.  The wound was again irrigated.  The specimen was sent to Pathology.  The wound was then closed with interrupted 4-0 Vicryl Rapide sutures.  A sterile compressive dressing to the finger applied.  A compressive dressing to the palm with the fingers free was applied to the remaining fingers.  On  deflation of the tourniquet, all fingers immediately pinked.  She was taken to the recovery room for observation in satisfactory condition.  She will be discharged home to return in 1 week on Vicodin.          ______________________________ Cindee Salt, M.D.     GK/MEDQ  D:  03/23/2013  T:  03/23/2013  Job:  [id number]

## 2013-03-23 NOTE — Brief Op Note (Signed)
03/23/2013  11:16 AM  PATIENT:  Primitivo Gauze  57 y.o. female  PRE-OPERATIVE DIAGNOSIS:  MUCOID TUMOR RIGHT MIDDLE FINGER, STENOSING TENOSYNOVITIS RIGHT RING FINGER  POST-OPERATIVE DIAGNOSIS:  Mucoid tumor Right Middle Finger, Stenosing Tenosynovitis Right Ring Finger  PROCEDURE:  Procedure(s): RELEASE TRIGGER FINGER/A-1 PULLEY RIGHT RING FINGER (Right) EXCISION CYST, DEBRIDEMENT DIP RIGHT MIDDLE FINGER (Right)  SURGEON:  Surgeon(s) and Role:    * Nicki Reaper, MD - Primary  PHYSICIAN ASSISTANT:   ASSISTANTS: none   ANESTHESIA:   local and regional  EBL:  Total I/O In: 600 [I.V.:600] Out: -   BLOOD ADMINISTERED:none  DRAINS: none   LOCAL MEDICATIONS USED:  MARCAINE     SPECIMEN:  Excision  DISPOSITION OF SPECIMEN:  PATHOLOGY  COUNTS:  YES  TOURNIQUET:  * Missing tourniquet times found for documented tourniquets in log:  [id number] *  DICTATION: .Other Dictation: Dictation Number [id number]  PLAN OF CARE: Discharge to home after PACU  PATIENT DISPOSITION:  PACU - hemodynamically stable.

## 2013-03-23 NOTE — Transfer of Care (Signed)
Immediate Anesthesia Transfer of Care Note  Patient: Lisa Odom  Procedure(s) Performed: Procedure(s): RELEASE TRIGGER FINGER/A-1 PULLEY RIGHT RING FINGER (Right) EXCISION CYST, DEBRIDEMENT DIP RIGHT MIDDLE FINGER (Right)  Patient Location: PACU  Anesthesia Type:MAC and Bier block  Level of Consciousness: awake, alert  and oriented  Airway & Oxygen Therapy: Patient Spontanous Breathing and Patient connected to face mask oxygen  Post-op Assessment: Report given to PACU RN and Post -op Vital signs reviewed and stable  Post vital signs: Reviewed and stable  Complications: No apparent anesthesia complications

## 2013-03-23 NOTE — Anesthesia Procedure Notes (Addendum)
Procedure Name: MAC Date/Time: 03/23/2013 10:44 AM Performed by: Burna Cash Pre-anesthesia Checklist: Patient identified, Emergency Drugs available, Suction available, Patient being monitored and Timeout performed Patient Re-evaluated:Patient Re-evaluated prior to inductionOxygen Delivery Method: Simple face mask    Anesthesia Regional Block:  Bier block (IV Regional)  Pre-Anesthetic Checklist: ,, timeout performed, Correct Patient, Correct Site, Correct Laterality, Correct Procedure,, site marked, surgical consent,, at surgeon's request Needles:  Injection technique: Single-shot  Needle Type: Other      Needle Gauge: 20 and 20 G    Additional Needles: Bier block (IV Regional) Narrative:   Performed by: Personally   Bier block (IV Regional)

## 2013-03-23 NOTE — Anesthesia Postprocedure Evaluation (Signed)
  Anesthesia Post-op Note  Patient: Lisa Odom  Procedure(s) Performed: Procedure(s): RELEASE TRIGGER FINGER/A-1 PULLEY RIGHT RING FINGER (Right) EXCISION CYST, DEBRIDEMENT DIP RIGHT MIDDLE FINGER (Right)  Patient Location: PACU  Anesthesia Type:Bier block  Level of Consciousness: awake, alert , oriented and patient cooperative  Airway and Oxygen Therapy: Patient Spontanous Breathing  Post-op Pain: none  Post-op Assessment: Post-op Vital signs reviewed, Patient's Cardiovascular Status Stable, Respiratory Function Stable, Patent Airway, No signs of Nausea or vomiting and Pain level controlled  Post-op Vital Signs: Reviewed and stable  Complications: No apparent anesthesia complications

## 2013-03-23 NOTE — Op Note (Signed)
Dictation Number [id number]

## 2013-03-23 NOTE — Anesthesia Preprocedure Evaluation (Addendum)
Anesthesia Evaluation  Patient identified by MRN, date of birth, ID band Patient awake    Reviewed: Allergy & Precautions, H&P , NPO status , Patient's Chart, lab work & pertinent test results  Airway Mallampati: I TM Distance: >3 FB Neck ROM: Full    Dental  (+) Teeth Intact and Dental Advisory Given   Pulmonary neg pulmonary ROS,  breath sounds clear to auscultation  Pulmonary exam normal       Cardiovascular hypertension (borderline, no meds presently), Rhythm:Regular Rate:Normal     Neuro/Psych  Headaches, PSYCHIATRIC DISORDERS Anxiety Depression    GI/Hepatic Neg liver ROS, hiatal hernia, GERD-  Medicated and Controlled,  Endo/Other  Morbid obesity  Renal/GU negative Renal ROS     Musculoskeletal   Abdominal (+) + obese,   Peds  Hematology   Anesthesia Other Findings   Reproductive/Obstetrics                          Anesthesia Physical Anesthesia Plan  ASA: II  Anesthesia Plan: MAC and Bier Block   Post-op Pain Management:    Induction:   Airway Management Planned: Natural Airway and Simple Face Mask  Additional Equipment:   Intra-op Plan:   Post-operative Plan:   Informed Consent: I have reviewed the patients History and Physical, chart, labs and discussed the procedure including the risks, benefits and alternatives for the proposed anesthesia with the patient or authorized representative who has indicated his/her understanding and acceptance.   Dental advisory given  Plan Discussed with: CRNA and Surgeon  Anesthesia Plan Comments: (Plan routine monitors, MAC with IV regional)        Anesthesia Quick Evaluation

## 2013-03-24 ENCOUNTER — Encounter (HOSPITAL_BASED_OUTPATIENT_CLINIC_OR_DEPARTMENT_OTHER): Payer: Self-pay | Admitting: Orthopedic Surgery

## 2013-07-29 ENCOUNTER — Ambulatory Visit (INDEPENDENT_AMBULATORY_CARE_PROVIDER_SITE_OTHER): Payer: 59 | Admitting: Family Medicine

## 2013-07-29 ENCOUNTER — Encounter: Payer: Self-pay | Admitting: Family Medicine

## 2013-07-29 VITALS — BP 130/80 | HR 91 | Temp 97.6°F | Ht 62.0 in | Wt 165.8 lb

## 2013-07-29 DIAGNOSIS — M79609 Pain in unspecified limb: Secondary | ICD-10-CM

## 2013-07-29 DIAGNOSIS — F332 Major depressive disorder, recurrent severe without psychotic features: Secondary | ICD-10-CM

## 2013-07-29 DIAGNOSIS — Z23 Encounter for immunization: Secondary | ICD-10-CM

## 2013-07-29 DIAGNOSIS — M79604 Pain in right leg: Secondary | ICD-10-CM

## 2013-07-29 DIAGNOSIS — M79671 Pain in right foot: Secondary | ICD-10-CM

## 2013-07-29 MED ORDER — GABAPENTIN 100 MG PO CAPS: 100.0000 mg | ORAL_CAPSULE | Freq: Every evening | ORAL | Status: DC | PRN

## 2013-07-29 NOTE — Assessment & Plan Note (Signed)
Much better control on adderall and fluoxetine. Followed by Dr. Evelene Croon.

## 2013-07-29 NOTE — Addendum Note (Signed)
Addended by: Alvina Chou on: 07/29/2013 02:40 PM   Modules accepted: Orders

## 2013-07-29 NOTE — Assessment & Plan Note (Signed)
Not clearly linked to RLS, no benefit with requip. No sign of low back pain, radiculopathy. Will eval with labs... Thyroid, diabetes, b12 etc. Consider nerve conduction if labs neg. Start gabapentin given pain has burning nerve quality.

## 2013-07-29 NOTE — Progress Notes (Signed)
Subjective:    Patient ID: Lisa Odom, female    DOB: 1956-07-07, 57 y.o.   MRN: [id number]  HPI  57 year old female presents with continued pain in B legs, now back and worse in last month. Pain is from knees to toes. Burning sensation.No thigh or hip pain. More like an constant ache on dorsal foot. No low back pain currently. Also has some intermittent right knee swelling and ache... Always there per her baseline.  She does not want to walk due to pain..but has not noted any weakness or numbness. Wakes her up at night.  Feels better with movement. But she denies some restless movement Feels better if she does not lie down to go to sleep.  No leg cramping  Has to use Xanax  Or tyelnol pm to help her go to sleep at night, only helps for 1-2 hours. Aleve, naprosyn does not help.    Seen at [facility] earlier in year... Had Lower leg burning B x 1 months at that time. No burning in feet at that time. Dx with likely RLS... Started on requip, made no diference.  Her symtpoms resolved. Pain returned 1 month ago.   Review of Systems  Constitutional: Negative for fever and fatigue.  HENT: Negative for ear pain.   Eyes: Negative for pain.  Respiratory: Negative for chest tightness and shortness of breath.   Cardiovascular: Negative for chest pain, palpitations and leg swelling.  Gastrointestinal: Negative for abdominal pain.  Genitourinary: Negative for dysuria.       Objective:   Physical Exam  Constitutional: Vital signs are normal. She appears well-developed and well-nourished. She is cooperative.  Non-toxic appearance. She does not appear ill. No distress.  HENT:  Head: Normocephalic.  Right Ear: Hearing, tympanic membrane, external ear and ear canal normal. Tympanic membrane is not erythematous, not retracted and not bulging.  Left Ear: Hearing, tympanic membrane, external ear and ear canal normal. Tympanic membrane is not erythematous, not retracted and not bulging.  Nose: No mucosal  edema or rhinorrhea. Right sinus exhibits no maxillary sinus tenderness and no frontal sinus tenderness. Left sinus exhibits no maxillary sinus tenderness and no frontal sinus tenderness.  Mouth/Throat: Uvula is midline, oropharynx is clear and moist and mucous membranes are normal.  Eyes: Conjunctivae, EOM and lids are normal. Pupils are equal, round, and reactive to light. Lids are everted and swept, no foreign bodies found.  Neck: Trachea normal and normal range of motion. Neck supple. Carotid bruit is not present. No mass and no thyromegaly present.  Cardiovascular: Normal rate, regular rhythm, S1 normal, S2 normal, normal heart sounds, intact distal pulses and normal pulses.  Exam reveals no gallop and no friction rub.   No murmur heard. B varicose veins, no peripheral edema  Pulmonary/Chest: Effort normal and breath sounds normal. Not tachypneic. No respiratory distress. She has no decreased breath sounds. She has no wheezes. She has no rhonchi. She has no rales.  Abdominal: Soft. Normal appearance and bowel sounds are normal. There is no tenderness.  Musculoskeletal:       Lumbar back: Normal. She exhibits normal range of motion, no tenderness and no bony tenderness.  Neg SLR  Neurological: She is alert. She has normal strength. No cranial nerve deficit or sensory deficit. She displays a negative Romberg sign. Gait normal.  nml monofilament  Skin: Skin is warm, dry and intact. No rash noted.  Psychiatric: Her speech is normal and behavior is normal. Judgment and thought content normal. Her  mood appears not anxious. Cognition and memory are normal. She does not exhibit a depressed mood.          Assessment & Plan:

## 2013-07-29 NOTE — Patient Instructions (Addendum)
Start gabapentin 100 mg at bedtime for nerve pain if not improving [pain in 1 week you can increase to 200 mg then 300 mg at bedtime. Stop at lab on way.

## 2013-07-29 NOTE — Assessment & Plan Note (Signed)
No deformity, no focal pain... Consider referral to Podiatry.

## 2013-07-30 LAB — LIPID PANEL
Chol/HDL Ratio: 4.3 ratio units (ref 0.0–4.4)
Cholesterol, Total: 200 mg/dL — ABNORMAL HIGH (ref 100–199)
HDL: 47 mg/dL (ref >39)
LDL Calculated: 123 mg/dL — ABNORMAL HIGH (ref 0–99)
Triglycerides: 152 mg/dL — ABNORMAL HIGH (ref 0–149)

## 2013-07-30 LAB — COMPREHENSIVE METABOLIC PANEL
ALT: 21 IU/L (ref 0–32)
AST: 12 IU/L (ref 0–40)
CO2: 25 mmol/L (ref 18–29)
Calcium: 9.9 mg/dL (ref 8.7–10.2)
Chloride: 103 mmol/L (ref 97–108)
Glucose: 82 mg/dL (ref 65–99)
Potassium: 4 mmol/L (ref 3.5–5.2)
Sodium: 142 mmol/L (ref 134–144)
Total Protein: 7 g/dL (ref 6.0–8.5)

## 2013-07-30 LAB — CBC WITH DIFFERENTIAL/PLATELET
Basophils Absolute: 0 10*3/uL (ref 0.0–0.2)
Eos: 3 %
HCT: 39.4 % (ref 34.0–46.6)
Hemoglobin: 13 g/dL (ref 11.1–15.9)
Immature Granulocytes: 0 %
Lymphocytes Absolute: 2.8 10*3/uL (ref 0.7–3.1)
Lymphs: 33 %
MCHC: 33 g/dL (ref 31.5–35.7)
Monocytes: 8 %
Neutrophils Absolute: 4.7 10*3/uL (ref 1.4–7.0)
WBC: 8.5 10*3/uL (ref 3.4–10.8)

## 2013-07-30 LAB — VITAMIN B12: Vitamin B-12: >1999 pg/mL — ABNORMAL HIGH (ref 211–946)

## 2013-08-04 ENCOUNTER — Telehealth: Payer: Self-pay

## 2013-08-04 ENCOUNTER — Ambulatory Visit (INDEPENDENT_AMBULATORY_CARE_PROVIDER_SITE_OTHER): Payer: 59 | Admitting: Family Medicine

## 2013-08-04 ENCOUNTER — Encounter: Payer: Self-pay | Admitting: Family Medicine

## 2013-08-04 VITALS — BP 120/80 | HR 104 | Temp 97.6°F | Ht 62.0 in | Wt 164.0 lb

## 2013-08-04 DIAGNOSIS — M79671 Pain in right foot: Secondary | ICD-10-CM

## 2013-08-04 DIAGNOSIS — E119 Type 2 diabetes mellitus without complications: Secondary | ICD-10-CM

## 2013-08-04 DIAGNOSIS — M79609 Pain in unspecified limb: Secondary | ICD-10-CM

## 2013-08-04 DIAGNOSIS — E1149 Type 2 diabetes mellitus with other diabetic neurological complication: Secondary | ICD-10-CM | POA: Insufficient documentation

## 2013-08-04 DIAGNOSIS — M79604 Pain in right leg: Secondary | ICD-10-CM

## 2013-08-04 DIAGNOSIS — E7800 Pure hypercholesterolemia, unspecified: Secondary | ICD-10-CM

## 2013-08-04 DIAGNOSIS — I10 Essential (primary) hypertension: Secondary | ICD-10-CM

## 2013-08-04 DIAGNOSIS — E1159 Type 2 diabetes mellitus with other circulatory complications: Secondary | ICD-10-CM | POA: Insufficient documentation

## 2013-08-04 DIAGNOSIS — Z23 Encounter for immunization: Secondary | ICD-10-CM

## 2013-08-04 NOTE — Assessment & Plan Note (Signed)
LDL not at goal with new dx DM. Info given on lifestyle changes. Recheck in 3 months.

## 2013-08-04 NOTE — Telephone Encounter (Signed)
Lab orders mailed to Ms. Igarashi.

## 2013-08-04 NOTE — Assessment & Plan Note (Signed)
Info given on lifestyle changes. Refer to nutritionist. Recheck in 3 months. Counseled on standards or care for DM as well as possible complications.  Check urine microalbumin at next OV.  Start baby aspirin as long as no stomach irritation. Given PNA vaccine.

## 2013-08-04 NOTE — Progress Notes (Signed)
  Subjective:    Patient ID: Lisa Odom, female    DOB: 10-14-1955, 57 y.o.   MRN: [id number]  HPI 57 year old female returns to clinic following recent labs that showed a new diagnosis of DM.   Lab Results  Component Value Date   HGBA1C 6.8* 07/29/2013   Also her cholesterol is not at goal LDL <100 with the new d x of diabetes Lab Results  Component Value Date   CHOL 198 01/02/2011   HDL 47 07/29/2013   LDLCALC 123* 07/29/2013   TRIG 152* 07/29/2013   CHOLHDL 4.3 07/29/2013     B lower leg pain... Started on low dose gabapentin for leg pain... Neg B12, no ETOH, nml TSH, nml cbc.  She has noted significant  improvement with 200 mg at bedtime. No SE.  Family hx of DM.  Review of Systems  Constitutional: Negative for fever and fatigue.  HENT: Negative for ear pain.   Eyes: Negative for pain.  Respiratory: Negative for chest tightness and shortness of breath.   Cardiovascular: Negative for chest pain, palpitations and leg swelling.  Gastrointestinal: Negative for abdominal pain.  Genitourinary: Negative for dysuria.       Objective:   Physical Exam  Constitutional: Vital signs are normal. She appears well-developed and well-nourished. She is cooperative.  Non-toxic appearance. She does not appear ill. No distress.  HENT:  Head: Normocephalic.  Right Ear: Hearing, tympanic membrane, external ear and ear canal normal. Tympanic membrane is not erythematous, not retracted and not bulging.  Left Ear: Hearing, tympanic membrane, external ear and ear canal normal. Tympanic membrane is not erythematous, not retracted and not bulging.  Nose: No mucosal edema or rhinorrhea. Right sinus exhibits no maxillary sinus tenderness and no frontal sinus tenderness. Left sinus exhibits no maxillary sinus tenderness and no frontal sinus tenderness.  Mouth/Throat: Uvula is midline, oropharynx is clear and moist and mucous membranes are normal.  Eyes: Conjunctivae, EOM and lids are normal. Pupils  are equal, round, and reactive to light. Lids are everted and swept, no foreign bodies found.  Neck: Trachea normal and normal range of motion. Neck supple. Carotid bruit is not present. No mass and no thyromegaly present.  Cardiovascular: Normal rate, regular rhythm, S1 normal, S2 normal, normal heart sounds, intact distal pulses and normal pulses.  Exam reveals no gallop and no friction rub.   No murmur heard. Pulmonary/Chest: Effort normal and breath sounds normal. Not tachypneic. No respiratory distress. She has no decreased breath sounds. She has no wheezes. She has no rhonchi. She has no rales.  Abdominal: Soft. Normal appearance and bowel sounds are normal. There is no tenderness.  Neurological: She is alert.  Skin: Skin is warm, dry and intact. No rash noted.  Psychiatric: Her speech is normal and behavior is normal. Judgment and thought content normal. Her mood appears not anxious. Cognition and memory are normal. She does not exhibit a depressed mood.    Diabetic foot exam: Normal inspection No skin breakdown No calluses  Normal DP pulses Normal sensation to light touch and monofilament Nails normal       Assessment & Plan:

## 2013-08-04 NOTE — Telephone Encounter (Signed)
The patient is in need of her lab orders to take to [facility] for her next apt.  Can these be mailed to her home address?    Thanks!

## 2013-08-04 NOTE — Assessment & Plan Note (Signed)
See podiatrist for further eval.

## 2013-08-04 NOTE — Telephone Encounter (Signed)
In outbox

## 2013-08-04 NOTE — Addendum Note (Signed)
Addended by: Damita Lack on: 08/04/2013 08:59 AM   Modules accepted: Orders

## 2013-08-04 NOTE — Patient Instructions (Addendum)
Can use 200 mg at bedtime of gabapentin. After 1 week increase to 300 mg if not at optimal pain control.  Stop at front desk to set up nutrition referral.  Follow up in 3 months with fasting labs prior.

## 2013-08-04 NOTE — Assessment & Plan Note (Signed)
Burning pain likely due to DM at least in part. Other lab work up neg.  Increase gabapentin as needed to 300 mg.

## 2013-08-04 NOTE — Assessment & Plan Note (Signed)
Well controlled. Continue current medication. At goal <130/80

## 2013-08-04 NOTE — Progress Notes (Signed)
Pre-visit discussion using our clinic review tool. No additional management support is needed unless otherwise documented below in the visit note.  

## 2013-08-09 ENCOUNTER — Other Ambulatory Visit: Payer: Self-pay | Admitting: Family Medicine

## 2013-08-10 NOTE — Telephone Encounter (Signed)
Last office visit 08/04/2013.  Refilled last on 07/29/2013 for #30.  Ok to refill?

## 2013-08-18 ENCOUNTER — Other Ambulatory Visit: Payer: Self-pay | Admitting: Internal Medicine

## 2013-08-22 ENCOUNTER — Telehealth: Payer: Self-pay | Admitting: Family Medicine

## 2013-08-22 NOTE — Telephone Encounter (Signed)
Patient Information:  Caller Name: [name]  Phone: [phone]  Patient: Lisa Odom, Lisa Odom  Gender: Female  DOB: 1956/09/12  Age: 57 Years  PCP: Kerby Nora (Family Practice)  Office Follow Up:  Does the office need to follow up with this patient?: Yes  Instructions For The Office: Rx for zofran please krs/can  RN Note:  Patient states [facility] tells her they never got her Rx for gabapentin.  Seen in office about diabetic neuropathy, and had been taking gabapentin 100mg  at bedtime, which was helpful, and Dr. Ermalene Searing was to order more for her after that visit.  Per Epic, Rx written 08/09/13 for gabapentin 100mg , 1 tab q hs, #30, RF x 5; receipt from pharmacy 08/11/13 0836.  TC to pharmmacy; Rx ordered as written.  Patient advised.  Patient also would like zofran called in for travel nausea (states she has discussed this with provider in the past, and zofran works very well for her.)  Is travelling for the holiday.  Info to office for provider review/Rx/callback.  May reach patient at [phone].  krs/can  Symptoms  Reason For Call & Symptoms: burning sensation from knees to toes; states gabapentin 100mg  helped at bedtime  Reviewed Health History In EMR: Yes  Reviewed Medications In EMR: Yes  Reviewed Allergies In EMR: Yes  Reviewed Surgeries / Procedures: Yes  Date of Onset of Symptoms: Unknown  Guideline(s) Used:  No Protocol Available - Information Only  Disposition Per Guideline:   Discuss with PCP and Callback by Nurse Today  Reason For Disposition Reached:   Nursing judgment  Advice Given:  N/A  Patient Will Follow Care Advice:  YES

## 2013-08-23 MED ORDER — ONDANSETRON HCL 8 MG PO TABS: 8.0000 mg | ORAL_TABLET | Freq: Three times a day (TID) | ORAL | Status: DC | PRN

## 2013-08-23 NOTE — Telephone Encounter (Signed)
Sent in rx for zofran.. Let pt know.

## 2013-08-23 NOTE — Telephone Encounter (Signed)
Patient notified as instructed by telephone. 

## 2013-09-03 LAB — HM DIABETES EYE EXAM

## 2013-09-06 ENCOUNTER — Encounter: Payer: 59 | Attending: Family Medicine

## 2013-09-06 DIAGNOSIS — E119 Type 2 diabetes mellitus without complications: Secondary | ICD-10-CM

## 2013-09-06 DIAGNOSIS — Z713 Dietary counseling and surveillance: Secondary | ICD-10-CM | POA: Insufficient documentation

## 2013-09-12 ENCOUNTER — Ambulatory Visit (INDEPENDENT_AMBULATORY_CARE_PROVIDER_SITE_OTHER): Payer: 59

## 2013-09-12 ENCOUNTER — Encounter: Payer: Self-pay | Admitting: Podiatry

## 2013-09-12 ENCOUNTER — Ambulatory Visit (INDEPENDENT_AMBULATORY_CARE_PROVIDER_SITE_OTHER): Payer: 59 | Admitting: Podiatry

## 2013-09-12 VITALS — BP 119/80 | HR 85 | Resp 16 | Ht 62.0 in | Wt 158.0 lb

## 2013-09-12 DIAGNOSIS — M79671 Pain in right foot: Secondary | ICD-10-CM

## 2013-09-12 DIAGNOSIS — M2010 Hallux valgus (acquired), unspecified foot: Secondary | ICD-10-CM

## 2013-09-12 DIAGNOSIS — E1149 Type 2 diabetes mellitus with other diabetic neurological complication: Secondary | ICD-10-CM

## 2013-09-12 DIAGNOSIS — M79609 Pain in unspecified limb: Secondary | ICD-10-CM

## 2013-09-12 MED ORDER — GABAPENTIN 300 MG PO CAPS: ORAL_CAPSULE | ORAL | Status: DC

## 2013-09-12 NOTE — Progress Notes (Signed)
   Subjective:    Patient ID: Lisa Odom, female    DOB: 05-30-1956, 57 y.o.   MRN: [id number]  HPI Comments: "my feet, i have all kinds of issues ive been putting off"  N bunions , recent diagnosis of diabetic, numbness burning  L both feet  D started in spring but eased off and last 3 months has got bad  O gradual  C worse  A night  Is the worse  T dr Ermalene Searing gave me gabapentin        Review of Systems  Constitutional: Positive for fatigue.  Cardiovascular:       Pain in calf while walking   Endocrine:       Diabetes   Musculoskeletal:       Muscle pain, difficulty walking , back pain, joint pain   Neurological: Positive for weakness, numbness and headaches.  Psychiatric/Behavioral: Positive for confusion.       Nervous   All other systems reviewed and are negative.       Objective:   Physical Exam: Vital signs are stable she is alert and oriented x3 I have reviewed her past medical history medications allergies social history family history surgical history. Pulses are strongly palpable bilateral, +2/4 DP and PT bilateral. Capillary fill time to digits one through 5 of the bilateral foot is noted to be immediate. Neurologic sensorium is diminished per Semmes-Weinstein monofilament to the level the midfoot bilaterally. Deep tendon reflexes are sluggish bilateral. Muscle strength is 5 over 5 dorsiflexors plantar flexors inverters everters all intrinsic musculature is intact. Orthopedic evaluation demonstrates mild pes planus hallux abductovalgus deformity is also noted bilateral. Nontender on palpation and range of motion. Cutaneous evaluation demonstrates supple well hydrated cutis, no erythema edema cellulitis drainage or odor. Radiographic evaluation demonstrates hallux abductovalgus deformity bilateral right greater the left with early osteoarthritic changes mild pes planus.       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  Plan: Increase her Neurontin 300 mg once in the morning once at night. She will start out at night for one week. I will followup with her in 2 months to reevaluate the concentration of the Neurontin.

## 2013-09-12 NOTE — Patient Instructions (Signed)
Bunion (Hallux Valgus) A bony bump (protrusion) on the inside of the foot, at the base of the first toe, is called a bunion (hallux valgus). A bunion causes the first toe to angle toward the other toes. SYMPTOMS   A bony bump on the inside of the foot, causing an outward turning of the first toe. It may also overlap the second toe.  Thickening of the skin (callus) over the bony bump.  Fluid buildup under the callus. Fluid may become red, tender, and swollen (inflamed) with constant irritation or pressure.  Foot pain and stiffness. CAUSES  Many causes exist, including:  Inherited from your family (genetics).  Injury (trauma) forcing the first toe into a position in which it overlaps other toes.  Bunions are also associated with wearing shoes that have a narrow toe box (pointy shoes). RISK INCREASES WITH:  Family history of foot abnormalities, especially bunions.  Arthritis.  Narrow shoes, especially high heels. PREVENTION  Wear shoes with a wide toe box.  Avoid shoes with high heels.  Wear a small pad between the big toe and second toe.  Maintain proper conditioning:  Foot and ankle flexibility.  Muscle strength and endurance. PROGNOSIS  With proper treatment, bunions can typically be cured. Occasionally, surgery is required.  RELATED COMPLICATIONS   Infection of the bunion.  Arthritis of the first toe.  Risks of surgery, including infection, bleeding, injury to nerves (numb toe), recurrent bunion, overcorrection (toe points inward), arthritis of the big toe, big toe pointing upward, and bone not healing. TREATMENT  Treatment first consists of stopping the activities that aggravate the pain, taking pain medicines, and icing to reduce inflammation and pain. Wear shoes with a wide toe box. Shoes can be modified by a shoe repair person to relieve pressure on the bunion, especially if you cannot find shoes with a wide enough toe box. You may also place a pad with the  center cut out in your shoe, to reduce pressure on the bunion. Sometimes, an arch support (orthotic) may reduce pressure on the bunion and alleviate the symptoms. Stretching and strengthening exercises for the muscles of the foot may be useful. You may choose to wear a brace or pad at night to hold the big toe away from the second toe. If non-surgical treatments are not successful, surgery may be needed. Surgery involves removing the overgrown tissue and correcting the position of the first toe, by realigning the bones. Bunion surgery is typically performed on an outpatient basis, meaning you can go home the same day as surgery. The surgery may involve cutting the mid portion of the bone of the first toe, or just cutting and repairing (reconstructing) the ligaments and soft tissues around the first toe.  MEDICATION   If pain medicine is needed, nonsteroidal anti-inflammatory medicines, such as aspirin and ibuprofen, or other minor pain relievers, such as acetaminophen, are often recommended.  Do not take pain medicine for 7 days before surgery.  Prescription pain relievers are usually only prescribed after surgery. Use only as directed and only as much as you need.  Ointments applied to the skin may be helpful. HEAT AND COLD  Cold treatment (icing) relieves pain and reduces inflammation. Cold treatment should be applied for 10 to 15 minutes every 2 to 3 hours for inflammation and pain and immediately after any activity that aggravates your symptoms. Use ice packs or an ice massage.  Heat treatment may be used prior to performing the stretching and strengthening activities prescribed by your   caregiver, physical therapist, or athletic trainer. Use a heat pack or a warm soak. SEEK MEDICAL CARE IF:   Symptoms get worse or do not improve in 2 weeks, despite treatment.  After surgery, you develop fever, increasing pain, redness, swelling, drainage of fluids, bleeding, or increasing warmth around the  surgical area.  New, unexplained symptoms develop. (Drugs used in treatment may produce side effects.) Document Released: 09/15/2005 Document Revised: 12/08/2011 Document Reviewed: 12/28/2008 [facility] Patient Information 2014 [place], [place]. Diabetes and Foot Care Diabetes may cause you to have problems because of poor blood supply (circulation) to your feet and legs. This may cause the skin on your feet to become thinner, break easier, and heal more slowly. Your skin may become dry, and the skin may peel and crack. You may also have nerve damage in your legs and feet causing decreased feeling in them. You may not notice minor injuries to your feet that could lead to infections or more serious problems. Taking care of your feet is one of the most important things you can do for yourself.  HOME CARE INSTRUCTIONS  Wear shoes at all times, even in the house. Do not go barefoot. Bare feet are easily injured.  Check your feet daily for blisters, cuts, and redness. If you cannot see the bottom of your feet, use a mirror or ask someone for help.  Wash your feet with warm water (do not use hot water) and mild soap. Then pat your feet and the areas between your toes until they are completely dry. Do not soak your feet as this can dry your skin.  Apply a moisturizing lotion or petroleum jelly (that does not contain alcohol and is unscented) to the skin on your feet and to dry, brittle toenails. Do not apply lotion between your toes.  Trim your toenails straight across. Do not dig under them or around the cuticle. File the edges of your nails with an emery board or nail file.  Do not cut corns or calluses or try to remove them with medicine.  Wear clean socks or stockings every day. Make sure they are not too tight. Do not wear knee-high stockings since they may decrease blood flow to your legs.  Wear shoes that fit properly and have enough cushioning. To break in new shoes, wear them for just a few  hours a day. This prevents you from injuring your feet. Always look in your shoes before you put them on to be sure there are no objects inside.  Do not cross your legs. This may decrease the blood flow to your feet.  If you find a minor scrape, cut, or break in the skin on your feet, keep it and the skin around it clean and dry. These areas may be cleansed with mild soap and water. Do not cleanse the area with peroxide, alcohol, or iodine.  When you remove an adhesive bandage, be sure not to damage the skin around it.  If you have a wound, look at it several times a day to make sure it is healing.  Do not use heating pads or hot water bottles. They may burn your skin. If you have lost feeling in your feet or legs, you may not know it is happening until it is too late.  Make sure your health care provider performs a complete foot exam at least annually or more often if you have foot problems. Report any cuts, sores, or bruises to your health care provider immediately. SEEK MEDICAL  CARE IF:   You have an injury that is not healing.  You have cuts or breaks in the skin.  You have an ingrown nail.  You notice redness on your legs or feet.  You feel burning or tingling in your legs or feet.  You have pain or cramps in your legs and feet.  Your legs or feet are numb.  Your feet always feel cold. SEEK IMMEDIATE MEDICAL CARE IF:   There is increasing redness, swelling, or pain in or around a wound.  There is a red line that goes up your leg.  Pus is coming from a wound.  You develop a fever or as directed by your health care provider.  You notice a bad smell coming from an ulcer or wound. Document Released: 09/12/2000 Document Revised: 05/18/2013 Document Reviewed: 02/22/2013 [facility] Patient Information 2014 [place].

## 2013-09-13 DIAGNOSIS — E119 Type 2 diabetes mellitus without complications: Secondary | ICD-10-CM

## 2013-09-14 NOTE — Progress Notes (Signed)
Patient was seen on 09/06/13 for the first of a series of three diabetes self-management courses at the [facility].  The following learning objectives were met by the patient during this class:  Describe diabetes  State some common risk factors for diabetes  Defines the role of glucose and insulin  Identifies type of diabetes and pathophysiology  Describe the relationship between diabetes and cardiovascular risk  State the members of the Healthcare Team  States the rationale for glucose monitoring  State when to test glucose  State their individual Target Range  State the importance of logging glucose readings  Describe how to interpret glucose readings  Identifies A1C target  Explain the correlation between A1c and eAG values  State symptoms and treatment of high blood glucose  State symptoms and treatment of low blood glucose  Explain proper technique for glucose testing  Identifies proper sharps disposal  Handouts given during class include:  Living Well with Diabetes book  Carb Counting and Meal Planning book  Meal Plan Card  Carbohydrate guide  Meal planning worksheet  Low Sodium Flavoring Tips  The diabetes portion plate  Low Carbohydrate Snack Suggestions  A1c to eAG Conversion Chart  Diabetes Medications  Stress Management  Diabetes Recommended Care Schedule  Diabetes Success Plan  Core Class Satisfaction Survey  Your patient has identified their diabetes care support plan as:  [facility]  Staff   Follow-Up Plan:  Attend core 2

## 2013-09-14 NOTE — Progress Notes (Signed)

## 2013-09-20 DIAGNOSIS — E119 Type 2 diabetes mellitus without complications: Secondary | ICD-10-CM

## 2013-09-20 NOTE — Progress Notes (Signed)
Patient was seen on 09/20/13 for the third of a series of three diabetes self-management courses at the [facility]. The following learning objectives were met by the patient during this class:    State the amount of activity recommended for healthy living   Describe activities suitable for individual needs   Identify ways to regularly incorporate activity into daily life   Identify barriers to activity and ways to over come these barriers  Identify diabetes medications being personally used and their primary action for lowering glucose and possible side effects   Describe role of stress on blood glucose and develop strategies to address psychosocial issues   Identify diabetes complications and ways to prevent them  Explain how to manage diabetes during illness   Evaluate success in meeting personal goal   Establish 2-3 goals that they will plan to diligently work on until they return for the  61-month follow-up visit  Goals:  Follow Diabetes Meal Plan as instructed  Aim for 15-30 mins of physical activity daily as tolerated  Bring food record and glucose log to your follow up visit  Your patient has established the following 4 month goals in their individualized success plan: I will eat 3 meals daily plus snacks I will increase my activity level at least 5 days a week for 30 minutes or more  Your patient has identified these potential barriers to change:  None stated  Your patient has identified their diabetes self-care support plan as  Diabetes Support Group

## 2013-10-10 ENCOUNTER — Ambulatory Visit: Payer: 59 | Admitting: Podiatry

## 2013-10-26 ENCOUNTER — Ambulatory Visit (INDEPENDENT_AMBULATORY_CARE_PROVIDER_SITE_OTHER): Payer: 59 | Admitting: Podiatry

## 2013-10-26 ENCOUNTER — Encounter: Payer: Self-pay | Admitting: Podiatry

## 2013-10-26 VITALS — BP 120/76 | HR 88 | Resp 16

## 2013-10-26 DIAGNOSIS — M79609 Pain in unspecified limb: Secondary | ICD-10-CM

## 2013-10-26 MED ORDER — GABAPENTIN 300 MG PO CAPS: ORAL_CAPSULE | ORAL | Status: DC

## 2013-10-26 NOTE — Progress Notes (Signed)
She presents today for followup of her Neurontin she states it seems to be helping however she still having some upper leg and knee pain. And her feet still hurt. She states that she's been taking 300 mg in the morning occasionally 300 mg at lunch and 300 mg at bedtime.  Objective: Vital signs are stable she is alert and oriented x3. Pulses are palpable bilateral. No change in epicritic sensation.  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  Plan: Increase gabapentin 300 mg in the morning, 300 mg at lunch, and 600 mg at night. Followup with her in one month

## 2013-11-03 ENCOUNTER — Encounter: Payer: Self-pay | Admitting: Family Medicine

## 2013-11-04 ENCOUNTER — Ambulatory Visit (INDEPENDENT_AMBULATORY_CARE_PROVIDER_SITE_OTHER)
Admission: RE | Admit: 2013-11-04 | Discharge: 2013-11-04 | Disposition: A | Discharge status: Home / Self Care | Payer: 59 | Source: Ambulatory Visit | Attending: Family Medicine | Admitting: Family Medicine

## 2013-11-04 ENCOUNTER — Encounter: Payer: Self-pay | Admitting: Family Medicine

## 2013-11-04 ENCOUNTER — Ambulatory Visit (INDEPENDENT_AMBULATORY_CARE_PROVIDER_SITE_OTHER): Payer: 59 | Admitting: Family Medicine

## 2013-11-04 VITALS — BP 110/74 | HR 86 | Temp 97.8°F | Ht 62.0 in | Wt 156.5 lb

## 2013-11-04 DIAGNOSIS — I10 Essential (primary) hypertension: Secondary | ICD-10-CM

## 2013-11-04 DIAGNOSIS — E1140 Type 2 diabetes mellitus with diabetic neuropathy, unspecified: Secondary | ICD-10-CM

## 2013-11-04 DIAGNOSIS — E119 Type 2 diabetes mellitus without complications: Secondary | ICD-10-CM

## 2013-11-04 DIAGNOSIS — E7800 Pure hypercholesterolemia, unspecified: Secondary | ICD-10-CM

## 2013-11-04 DIAGNOSIS — M25562 Pain in left knee: Secondary | ICD-10-CM

## 2013-11-04 DIAGNOSIS — M25561 Pain in right knee: Secondary | ICD-10-CM

## 2013-11-04 DIAGNOSIS — M25569 Pain in unspecified knee: Secondary | ICD-10-CM

## 2013-11-04 DIAGNOSIS — E1149 Type 2 diabetes mellitus with other diabetic neurological complication: Secondary | ICD-10-CM

## 2013-11-04 LAB — HM DIABETES FOOT EXAM

## 2013-11-04 MED ORDER — MELOXICAM 15 MG PO TABS: 15.0000 mg | ORAL_TABLET | Freq: Every day | ORAL | Status: DC

## 2013-11-04 NOTE — Assessment & Plan Note (Signed)
Improving control.  Encouraged exercise, weight loss, healthy eating habits.  

## 2013-11-04 NOTE — Progress Notes (Signed)
58 year old female returns to for 3 month follow up of DM.    She reports she is feeling better overall. BP Readings from Last 3 Encounters:  11/04/13 110/74  10/26/13 120/76  09/12/13 119/80    Diabetes: A1C at outside labs... 6.3, improving with lifestyle changes. She went nutrition classes and has been working on changes. Lab Results  Component Value Date   HGBA1C 6.8* 07/29/2013  Using medications without difficulties: None Hypoglycemic episodes :'not checking" Hyperglycemic episodes: Feet problems:See below, no ulcers. Blood Sugars averaging: None eye exam within last year:08/2013  She is interested in getting a meter.. rx given.  Due for re-eval of cholesterol as well.. Not at goal <100 yet. LDL at outside labs 11/03/2013:  132, HDl 48 Lab Results  Component Value Date   CHOL 198 01/02/2011   HDL 47 07/29/2013   LDLCALC 123* 07/29/2013   TRIG 152* 07/29/2013   CHOLHDL 4.3 07/29/2013     B lower leg pain... At last OV increased gabapentin for leg pain... Has helped a lot/ no oversedation... Neg B12, no ETOH, nml TSH, nml cbc.  She was also referred to podiatry for foot pain B. Bone spurs seen at recent [place] at podiatry. Considering surgical intervention.   She does have B knee pain for years but worse in last 6 months. Right greater than left. Sometimes limits exercise.  Constant ache, stiff in AM or after sitting a while. Occ swelling, no redness. No injury.  Review of Systems  Constitutional: Negative for fever and fatigue.  HENT: Negative for ear pain.  Eyes: Negative for pain.  Respiratory: Negative for chest tightness and shortness of breath.  Cardiovascular: Negative for chest pain, palpitations and leg swelling.  Gastrointestinal: Negative for abdominal pain.  Genitourinary: Negative for dysuria.  Objective:   Physical Exam  Constitutional: Vital signs are normal. She appears well-developed and well-nourished. She is cooperative. Non-toxic appearance. She  does not appear ill. No distress.  HENT:  Head: Normocephalic.  Right Ear: Hearing, tympanic membrane, external ear and ear canal normal. Tympanic membrane is not erythematous, not retracted and not bulging.  Left Ear: Hearing, tympanic membrane, external ear and ear canal normal. Tympanic membrane is not erythematous, not retracted and not bulging.  Nose: No mucosal edema or rhinorrhea. Right sinus exhibits no maxillary sinus tenderness and no frontal sinus tenderness. Left sinus exhibits no maxillary sinus tenderness and no frontal sinus tenderness.  Mouth/Throat: Uvula is midline, oropharynx is clear and moist and mucous membranes are normal.  Eyes: Conjunctivae, EOM and lids are normal. Pupils are equal, round, and reactive to light. Lids are everted and swept, no foreign bodies found.  Neck: Trachea normal and normal range of motion. Neck supple. Carotid bruit is not present. No mass and no thyromegaly present.  Cardiovascular: Normal rate, regular rhythm, S1 normal, S2 normal, normal heart sounds, intact distal pulses and normal pulses. Exam reveals no gallop and no friction rub.  No murmur heard.  Pulmonary/Chest: Effort normal and breath sounds normal. Not tachypneic. No respiratory distress. She has no decreased breath sounds. She has no wheezes. She has no rhonchi. She has no rales.  Abdominal: Soft. Normal appearance and bowel sounds are normal. There is no tenderness.  Neurological: She is alert.  Skin: Skin is warm, dry and intact. No rash noted.  Psychiatric: Her speech is normal and behavior is normal. Judgment and thought content normal. Her mood appears not anxious. Cognition and memory are normal. She does not exhibit a  depressed mood.  MSK: B knees: right mediall joint line ttp, but other area of joint line more mildly tender on right and left, neg Mcmurray's, neg ACL, PCL, full ROM, some grinding on exam  Diabetic foot exam:  Normal inspection  No skin breakdown  No calluses   Normal DP pulses  Normal sensation to light touch and monofilament  Nails normal  Assessment & Plan:

## 2013-11-04 NOTE — Assessment & Plan Note (Signed)
Well controlled. Continue current medication.  

## 2013-11-04 NOTE — Patient Instructions (Signed)
Keep up good work. Stop at X-ray on way ut.  Use meloxicam as needed for knee and foot pain.  Continue other meds as discussed.

## 2013-11-04 NOTE — Assessment & Plan Note (Signed)
Not at goal, not interested in med. Working on lifestyle change. Will re-eval in 3 months.

## 2013-11-04 NOTE — Progress Notes (Signed)
Pre-visit discussion using our clinic review tool. No additional management support is needed unless otherwise documented below in the visit note.  

## 2013-11-04 NOTE — Assessment & Plan Note (Signed)
Anterior medical knee pain, greater on right.  Treat with NSAIDs prn and eval with X-ray.  Likely OA.

## 2013-11-04 NOTE — Assessment & Plan Note (Signed)
PAin in legs improved with increas in gabapentin.

## 2013-11-07 ENCOUNTER — Other Ambulatory Visit: Payer: Self-pay | Admitting: Family Medicine

## 2013-11-07 ENCOUNTER — Other Ambulatory Visit: Payer: Self-pay | Admitting: *Deleted

## 2013-11-07 MED ORDER — MELOXICAM 15 MG PO TABS: 15.0000 mg | ORAL_TABLET | Freq: Every day | ORAL | Status: DC

## 2013-11-07 NOTE — Telephone Encounter (Signed)
Last office visit and refill 11/04/13. Patient is switching to [facility] mail order.  Print refill to fax into mail order.

## 2013-11-08 NOTE — Telephone Encounter (Signed)
Prescription faxed to [facility] [phone]. 

## 2013-11-23 ENCOUNTER — Ambulatory Visit: Payer: 59 | Admitting: Podiatry

## 2013-11-24 ENCOUNTER — Encounter: Payer: Self-pay | Admitting: Podiatry

## 2013-11-24 ENCOUNTER — Ambulatory Visit: Payer: 59 | Admitting: Podiatry

## 2013-11-24 VITALS — BP 128/79 | HR 102 | Resp 16

## 2013-11-24 DIAGNOSIS — M79609 Pain in unspecified limb: Secondary | ICD-10-CM

## 2013-11-24 DIAGNOSIS — M2010 Hallux valgus (acquired), unspecified foot: Secondary | ICD-10-CM

## 2013-11-24 DIAGNOSIS — E1149 Type 2 diabetes mellitus with other diabetic neurological complication: Secondary | ICD-10-CM

## 2013-11-24 NOTE — Progress Notes (Signed)
Follow up on gabapentin , pt states doing much better. She states that she 75-80% better. Improving A1c. Currently her A1c is 6.2.  Objective: Vital signs are stable she is alert and oriented x3. Pulses are palpable bilateral there is no change neurologically.   Assessment:  diabetic peripheral neuropathy.   Plan: Continue current therapies with gabapentin.

## 2013-12-01 ENCOUNTER — Other Ambulatory Visit: Payer: Self-pay | Admitting: *Deleted

## 2013-12-01 MED ORDER — GABAPENTIN 300 MG PO CAPS
ORAL_CAPSULE | ORAL | Status: DC
Start: 2013-12-01 — End: 2014-01-31

## 2013-12-01 NOTE — Telephone Encounter (Signed)
Fax request requesting gabapentin .

## 2013-12-19 ENCOUNTER — Telehealth: Payer: Self-pay | Admitting: Family Medicine

## 2013-12-19 ENCOUNTER — Ambulatory Visit (INDEPENDENT_AMBULATORY_CARE_PROVIDER_SITE_OTHER): Payer: 59 | Admitting: Family Medicine

## 2013-12-19 ENCOUNTER — Encounter: Payer: Self-pay | Admitting: Family Medicine

## 2013-12-19 ENCOUNTER — Encounter: Payer: Self-pay | Admitting: *Deleted

## 2013-12-19 VITALS — BP 126/80 | HR 83 | Temp 97.7°F | Wt 153.8 lb

## 2013-12-19 DIAGNOSIS — J069 Acute upper respiratory infection, unspecified: Secondary | ICD-10-CM

## 2013-12-19 MED ORDER — BENZONATATE 200 MG PO CAPS: 200.0000 mg | ORAL_CAPSULE | Freq: Three times a day (TID) | ORAL | Status: DC | PRN

## 2013-12-19 NOTE — Progress Notes (Signed)
Pre visit review using our clinic review tool, if applicable. No additional management support is needed unless otherwise documented below in the visit note.  Fevers, chills, cough, sputum, voice is altered, post nasal gtt.  Going on for about 5 days.  No ear pain. Rhinorrhea, and stuffy nose.  Mild ST, more ST initially.  Occ wheeze.  Cough comes in fits.  No vomiting, one episode of diarrhea likely related to a particular food. Fatigue.  She feels some better today.   Meds, vitals, and allergies reviewed.   ROS: See HPI.  Otherwise, noncontributory.  GEN: nad, alert and oriented HEENT: mucous membranes moist, tm w/o erythema, nasal exam w/o erythema, clear discharge noted,  OP with cobblestoning, sinuses not ttp x4 NECK: supple w/o LA CV: rrr.   PULM: ctab, no inc wob EXT: no edema SKIN: no acute rash

## 2013-12-19 NOTE — Telephone Encounter (Signed)
Noted  

## 2013-12-19 NOTE — Telephone Encounter (Signed)
Patient Information:  Caller Name: Catelyn  Phone: [phone]  Patient: Bonney, Berres  Gender: Female  DOB: [birth year]  Age: 58 Years  PCP: [name] (Family Practice)  Office Follow Up:  Does the office need to follow up with this patient?: No  Instructions For The Office: N/A   Symptoms  Reason For Call & Symptoms: Onset 12/15/2013 laryngitis, chills, and productive cough with greenish-yellow drainage (diarrhea x 3-4 on 12/17/2013).  Reviewed Health History In EMR: Yes  Reviewed Medications In EMR: Yes  Reviewed Allergies In EMR: Yes  Reviewed Surgeries / Procedures: Yes  Date of Onset of Symptoms: 12/15/2013  Treatments Tried: Mucinex, last dose 12/18/2013 at hs  Treatments Tried Worked: No  Any Fever: Yes  Fever Taken: Tactile  Fever Time Of Reading: 12:30:00  Fever Last Reading: N/A  Guideline(s) Used:  Colds  Disposition Per Guideline:   See Today in Office  Reason For Disposition Reached:   Sinus pain (not just congestion) and fever  Advice Given:  Treatment for Associated Symptoms of Colds:  For muscle aches, headaches, or moderate fever (more than 101 F or 38.9 C): Take acetaminophen every 4 hours.  Sore throat: Try throat lozenges, hard candy, or warm chicken broth.  Cough: Use cough drops.  Hydrate: Drink adequate liquids.  Call Back If:  Difficulty breathing occurs  You become worse  Patient Will Follow Care Advice:  YES  Appointment Scheduled:  12/19/2013 16:00:00 Appointment Scheduled Provider:  Elsie Stain Brigitte Pulse) [facility])

## 2013-12-19 NOTE — Patient Instructions (Signed)
Tessalon for cough.  Drink plenty of fluids and gargle with warm salt water for your throat if needed.  Voice rest in the meantime.  This should gradually improve.  Take care.  Let us know if you have other concerns.

## 2013-12-20 DIAGNOSIS — J069 Acute upper respiratory infection, unspecified: Secondary | ICD-10-CM | POA: Insufficient documentation

## 2013-12-20 NOTE — Assessment & Plan Note (Signed)
Likely viral, some better today, nontoxic, reassured. Supportive care. Tessalon prn cough, voice rest.  D/w pt.  F/u prn.

## 2013-12-22 ENCOUNTER — Telehealth: Payer: Self-pay

## 2013-12-22 NOTE — Telephone Encounter (Signed)
Pt left v/m; was seen 12/19/13 and was to have voice rest; pt request note allowing pt to return to work on 12/23/13.Please advise.

## 2013-12-22 NOTE — Telephone Encounter (Signed)
Note done

## 2013-12-22 NOTE — Telephone Encounter (Signed)
Patient advised. Note left at front desk for pick up.  

## 2014-01-03 ENCOUNTER — Telehealth: Payer: Self-pay | Admitting: *Deleted

## 2014-01-03 NOTE — Telephone Encounter (Signed)
Received prior authorization from [facility] for One Touch Ultra Blue Test Strips.  Prior Authorization completed on CoverMyMeds.  Will Await Outcome.

## 2014-01-05 ENCOUNTER — Other Ambulatory Visit: Payer: Self-pay | Admitting: Family Medicine

## 2014-01-09 ENCOUNTER — Other Ambulatory Visit: Payer: Self-pay | Admitting: *Deleted

## 2014-01-09 MED ORDER — GLUCOSE BLOOD VI STRP: ORAL_STRIP | Status: DC

## 2014-01-09 NOTE — Telephone Encounter (Signed)
Prior authorization is not required.  Plan requires mandatory mail order after 1 refill at retail pharmacy.

## 2014-01-25 ENCOUNTER — Encounter: Payer: 59 | Attending: Family Medicine | Admitting: *Deleted

## 2014-01-25 ENCOUNTER — Other Ambulatory Visit: Payer: Self-pay | Admitting: Family Medicine

## 2014-01-25 ENCOUNTER — Encounter: Payer: Self-pay | Admitting: *Deleted

## 2014-01-25 VITALS — Ht 62.0 in | Wt 151.5 lb

## 2014-01-25 DIAGNOSIS — E1149 Type 2 diabetes mellitus with other diabetic neurological complication: Secondary | ICD-10-CM

## 2014-01-25 DIAGNOSIS — E119 Type 2 diabetes mellitus without complications: Secondary | ICD-10-CM | POA: Insufficient documentation

## 2014-01-25 DIAGNOSIS — Z713 Dietary counseling and surveillance: Secondary | ICD-10-CM | POA: Insufficient documentation

## 2014-01-25 NOTE — Progress Notes (Signed)
  Patient was seen on 01/25/14 for her 4 month follow-up as a part of the diabetes self-management courses at the [facility].  Patient self reports the following:  Diabetes control has improved since diabetes self-management training: Very much. Tends to skip meals. Snacks through the day on nuts and apple slices, cheese, peanut butter. Is reading labels.  Exercise: None, uses stairs at work 3rd floor. New gym at work, Has new fit bit . Encouraged to Walk to a gradual goal of 150 minutes per week. Glucose: 6.8%  . Order received 11/04/13 for testing supplies.Has just obtained testing supplies this week. Thinks she might start tonight.  Number of days blood glucose is >200: Does not test Last MD appointment for diabetes: 11/04/13 Confidence with ability to manage diabetes: high Areas for improvement with diabetes self-care: Eat three regular meals per day with occasional snacks as needed. Willingness to participate in diabetes support group: Not at this time  Follow-Up Plan: Patient to call and schedule as needed.

## 2014-01-31 ENCOUNTER — Other Ambulatory Visit: Payer: Self-pay | Admitting: *Deleted

## 2014-01-31 MED ORDER — GABAPENTIN 300 MG PO CAPS: ORAL_CAPSULE | ORAL | Status: DC

## 2014-01-31 NOTE — Telephone Encounter (Signed)
[facility] SENT FAX REQUEST FOR NEURONTIN 300 MG TAKE ONE CAPSULE BY MOUTH IN THE MORNING AND AT LUNCH, THEN TAKE 2 CAPSULES AT BEDTIME. #120 WITH 3 REFILLS.

## 2014-02-08 ENCOUNTER — Ambulatory Visit (INDEPENDENT_AMBULATORY_CARE_PROVIDER_SITE_OTHER): Payer: 59 | Admitting: Family Medicine

## 2014-02-08 ENCOUNTER — Encounter: Payer: Self-pay | Admitting: Family Medicine

## 2014-02-08 VITALS — BP 120/82 | HR 78 | Temp 97.4°F | Ht 61.5 in | Wt 148.5 lb

## 2014-02-08 DIAGNOSIS — F332 Major depressive disorder, recurrent severe without psychotic features: Secondary | ICD-10-CM

## 2014-02-08 NOTE — Patient Instructions (Signed)
I agree that your fatigue may be caused by depression  If you get worse or have any thoughts of self harm - please get help at the ER / [facility] and let us know  Call your psychiatrist's office when you get home and get an appt asap

## 2014-02-08 NOTE — Progress Notes (Signed)
Subjective:    Patient ID: Lisa Odom, female    DOB: 02-04-1956, 58 y.o.   MRN: [id number]  HPI Here for extreme fatigue - (last week had to take 2 d off work) -- could barely get out of bed on Friday  ? If she sleeps well/ generally not  No significant snoring  At least a month Has not seen her PCP about it   Thinks her depression is coming back  Also down in dumps - sad/ tearful  Had a severe event 13 years ago - no precip event , was not hospitalized and no suicide attempts in the past  Back then she quit her job and did not work for month   She has bad stress from work right now  She "does not cope well" Dr Toy Care tried to take her out of work and she would not do  No hx of bipolar dz - but sometimes she does get anxious and takes xanax for panic attacks  Not seeing a counselor in the past   She does not think anything physically is wrong  Is loosing weight - with healthy diet and exercise  Now has no appetitie   Denies suicidal thoughts   Lab Results  Component Value Date   HGBA1C 6.8* 07/29/2013     She takes adderall twice daily  Caffeine - just a little some days   Patient Active Problem List   Diagnosis Date Noted  . URI (upper respiratory infection) 12/20/2013  . DM neuropathy, type II diabetes mellitus 11/04/2013  . Bilateral anterior knee pain 11/04/2013  . 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 Trigger ring finger of right hand 02/22/2013  . Bilateral foot pain 02/22/2013  . Bilateral leg pain, nighttime 11/30/2012  . Carpal tunnel syndrome of left wrist 11/30/2012  . Panic attack 02/16/2012  . GERD (gastroesophageal reflux disease) 09/05/2011  . ESSENTIAL HYPERTENSION, BENIGN 04/27/2009  . HYPERCHOLESTEROLEMIA 12/19/2008  . Major depressive disorder, recurrent episode, severe, without mention of psychotic behavior 12/19/2008  . MIGRAINE, COMMON 12/19/2008  . ALLERGIC RHINITIS 12/19/2008  . GASTRIC ULCER,  ACUTE 12/19/2008  . DIVERTICULITIS, COLON 12/19/2008   Past Medical History  Diagnosis Date  . Diverticulosis   . Hiatal hernia   . Gastritis   . Migraine   . Depression   . Anxiety   . GERD (gastroesophageal reflux disease)   . Contact lens/glasses fitting     wears contacts or glasses  . Diabetes mellitus without complication    Past Surgical History  Procedure Laterality Date  . Shoulder arthroscopy with rotator cuff repair  2010    right  . Carpal tunnel release  2002    right  . Tubal ligation    . Colonoscopy    . Upper gi endoscopy    . Trigger finger release Right 03/23/2013    Procedure: RELEASE TRIGGER FINGER/A-1 PULLEY RIGHT RING FINGER;  Surgeon: [name], MD;  Location: [facility];  Service: Orthopedics;  Laterality: Right;   History  Substance Use Topics  . Smoking status: Never Smoker   . Smokeless tobacco: Never Used  . Alcohol Use: Yes     Comment: rarely   Family History  Problem Relation Age of Onset  . Heart disease Brother   . Ovarian cancer Cousin   . Breast cancer Maternal Grandmother     grandmother  . Diabetes Maternal Aunt     aunt  . Diabetes Sister   .  Alcohol abuse Maternal Uncle     uncle  . Diabetes Paternal Aunt   . Alcohol abuse Paternal Uncle    Allergies  Allergen Reactions  . Sulfonamide Derivatives     REACTION: Rash  . Hydrochlorothiazide Rash   Current Outpatient Prescriptions on File Prior to Visit  Medication Sig Dispense Refill  . ALPRAZolam (XANAX) 0.5 MG tablet Take 1 tablet by mouth 2 (two) times daily as needed.      Marland Kitchen amphetamine-dextroamphetamine (ADDERALL) 20 MG tablet Take 20 mg by mouth as needed.      Marland Kitchen amphetamine-dextroamphetamine (ADDERALL) 30 MG tablet Take 30 mg by mouth every morning.      Marland Kitchen aspirin 81 MG chewable tablet Chew by mouth daily.      Marland Kitchen aspirin-acetaminophen-caffeine (EXCEDRIN MIGRAINE) 250-250-65 MG per tablet Take 2 tablets by mouth every 6 (six) hours as needed.  TAKES FOR MIGRAINES      . benzonatate (TESSALON) 200 MG capsule Take 1 capsule (200 mg total) by mouth 3 (three) times daily as needed for cough.  30 capsule  1  . FLUoxetine (PROZAC) 40 MG capsule Take 40 mg by mouth daily.      Marland Kitchen gabapentin (NEURONTIN) 300 MG capsule Take one tablet by mouth in the morning and at lunch.  Then two by mouth at bedtime  120 capsule  3  . glucose blood (ONE TOUCH ULTRA TEST) test strip Use to check blood sugar daily.  Dx: 250.60  100 each  3  . meloxicam (MOBIC) 15 MG tablet Take 1 tablet by mouth  daily  90 tablet  0  . Multiple Vitamin (MULTIVITAMIN) tablet Take 1 tablet by mouth daily.      . ondansetron (ZOFRAN) 8 MG tablet Take 1 tablet (8 mg total) by mouth every 8 (eight) hours as needed for nausea or vomiting.  20 tablet  0  . pantoprazole (PROTONIX) 40 MG tablet Take 1 tablet by mouth   daily  90 tablet  1   No current facility-administered medications on file prior to visit.    Review of Systems Review of Systems  Constitutional: Negative for fever, appetite change,  and unexpected weight change. pos for fatigue  Eyes: Negative for pain and visual disturbance.  Respiratory: Negative for cough and shortness of breath.   Cardiovascular: Negative for cp or palpitations    Gastrointestinal: Negative for nausea, diarrhea and constipation.  Genitourinary: Negative for urgency and frequency.  Skin: Negative for pallor or rash   Neurological: Negative for weakness, light-headedness, numbness and headaches.  Hematological: Negative for adenopathy. Does not bruise/bleed easily.  Psychiatric/Behavioral: pos for dysphoric mood with sadness and vegetative symptoms          Objective:   Physical Exam  Constitutional: She appears well-developed and well-nourished. No distress.  Tearful at times but calm  HENT:  Head: Normocephalic and atraumatic.  Eyes: Conjunctivae and EOM are normal. Pupils are equal, round, and reactive to light.  Neck: Normal range of  motion. Neck supple.  Cardiovascular: Normal rate and regular rhythm.   Pulmonary/Chest: Effort normal and breath sounds normal. No respiratory distress. She has no wheezes.  Lymphadenopathy:    She has no cervical adenopathy.  Neurological: She is alert. She has normal reflexes. She displays no tremor.  Skin: Skin is warm and dry. No rash noted. No erythema. No pallor.  Psychiatric: Her speech is normal. Thought content normal. Her mood appears not anxious. Her affect is blunt. Her affect is not inappropriate. She  is slowed and withdrawn. Thought content is not paranoid. Cognition and memory are normal. She exhibits a depressed mood. She expresses no homicidal and no suicidal ideation.  Pt seemed to have good insight about her depression and talked candidly about her symptoms           Assessment & Plan:

## 2014-02-08 NOTE — Progress Notes (Signed)
Pre visit review using our clinic review tool, if applicable. No additional management support is needed unless otherwise documented below in the visit note. 

## 2014-02-09 NOTE — Assessment & Plan Note (Signed)
Pt is certain that this is making her fatigued and I agree  Reassuring exam  Reviewed stressors/ coping techniques/symptoms/ support sources/ tx options and side effects in detail today  She declines counseling  I enc her to call her psychiatrist today and make treatment plan asap  If any suicidal ideology she knows to seek care at behavioral health Verified that she has good family support  Enc her to update [place] when she gets in touch with psychiatry  Will copy to PCP >25 minutes spent in face to face time with patient, >50% spent in counselling or coordination of care

## 2014-02-16 ENCOUNTER — Encounter: Payer: Self-pay | Admitting: Podiatry

## 2014-02-16 ENCOUNTER — Ambulatory Visit: Payer: 59 | Admitting: Podiatry

## 2014-02-16 VITALS — BP 120/72 | HR 72 | Resp 16

## 2014-02-16 DIAGNOSIS — M2040 Other hammer toe(s) (acquired), unspecified foot: Secondary | ICD-10-CM

## 2014-02-16 DIAGNOSIS — M79609 Pain in unspecified limb: Secondary | ICD-10-CM

## 2014-02-16 DIAGNOSIS — E1149 Type 2 diabetes mellitus with other diabetic neurological complication: Secondary | ICD-10-CM

## 2014-02-16 NOTE — Progress Notes (Signed)
She presents today for followup of her neuropathy states that she's doing much better taking 300 mg of Neurontin in the morning and at lunch time, 600 mg at nighttime. She's also having some tenderness on her third toe of her left foot overlying the DIPJ she points to a red area.  Objective: Vital signs are stable she is alert and oriented x3 pulses are palpable bilateral. Hammertoe deformities are noted bilateral she has reactive area of erythema overlying the dorsal lateral aspect of the DIPJ third digit left foot. This is probably a bursitis associated with shoe gear.  Assessment: Neuropathy bilateral. Controlled to approximately 80% with gabapentin. I demonstrated wrapping technique for her third toe of her left foot. Should this not alleviate her symptoms then we would consider a Darco digital night splint.

## 2014-02-28 ENCOUNTER — Encounter: Payer: Self-pay | Admitting: Family Medicine

## 2014-02-28 ENCOUNTER — Ambulatory Visit (INDEPENDENT_AMBULATORY_CARE_PROVIDER_SITE_OTHER): Payer: 59 | Admitting: Family Medicine

## 2014-02-28 VITALS — BP 110/72 | HR 89 | Temp 97.6°F | Ht 61.5 in | Wt 151.5 lb

## 2014-02-28 DIAGNOSIS — M76899 Other specified enthesopathies of unspecified lower limb, excluding foot: Secondary | ICD-10-CM

## 2014-02-28 DIAGNOSIS — M7062 Trochanteric bursitis, left hip: Secondary | ICD-10-CM | POA: Insufficient documentation

## 2014-02-28 DIAGNOSIS — M25562 Pain in left knee: Principal | ICD-10-CM

## 2014-02-28 DIAGNOSIS — M25561 Pain in right knee: Secondary | ICD-10-CM

## 2014-02-28 DIAGNOSIS — M25569 Pain in unspecified knee: Secondary | ICD-10-CM

## 2014-02-28 MED ORDER — TRAMADOL HCL 50 MG PO TABS: 50.0000 mg | ORAL_TABLET | Freq: Three times a day (TID) | ORAL | Status: DC | PRN

## 2014-02-28 NOTE — Assessment & Plan Note (Signed)
NSAIDs, exercises.  Likely would benefit from left hip injection . Will refer to ortho.

## 2014-02-28 NOTE — Progress Notes (Signed)
Subjective:    Patient ID: Lisa Odom, female    DOB: [birth year], 58 y.o.   MRN: [id number]  Knee Pain     58 year old female presents for follow up on B knee pain.   She does have B knee pain for years but worse in last 6-9 months. Right greater than left.   Worse pain than at last check in 10/2013 Pain at rest and with walking, limits exercise.  Constant ache, stiff in AM or after sitting a while.  Occ swelling, no redness.  No injury.  At last OV X-ray of right knee performed showing IMPRESSION:  1. Severe tricompartment degenerative change.  2. Multiple loose bodies. Donor site is most likely the lateral  aspect of the medial femoral condyle.  3. Probable small knee joint effusion.  MRI of the knee should be considered for further evaluation.  Started on meloxicam for pain and inflamamation. Helps some with pain.   Her mod is somewhat improved. She has scheduled  follow up with Dr. Buddy Duty.  She has been having some intermittant left hip pain. No recent falls.  Some pain in hands, MCP joint, less severe. Review of Systems  Constitutional: Negative for fever and fatigue.  HENT: Negative for ear pain.   Eyes: Negative for pain.  Respiratory: Negative for chest tightness and shortness of breath.   Cardiovascular: Negative for chest pain, palpitations and leg swelling.  Gastrointestinal: Negative for abdominal pain.  Genitourinary: Negative for dysuria.       Objective:   Physical Exam  Constitutional: Vital signs are normal. She appears well-developed and well-nourished. She is cooperative.  Non-toxic appearance. She does not appear ill. No distress.  HENT:  Head: Normocephalic.  Right Ear: Hearing, tympanic membrane, external ear and ear canal normal. Tympanic membrane is not erythematous, not retracted and not bulging.  Left Ear: Hearing, tympanic membrane, external ear and ear canal normal. Tympanic membrane is not erythematous, not retracted and not bulging.  Nose:  No mucosal edema or rhinorrhea. Right sinus exhibits no maxillary sinus tenderness and no frontal sinus tenderness. Left sinus exhibits no maxillary sinus tenderness and no frontal sinus tenderness.  Mouth/Throat: Uvula is midline, oropharynx is clear and moist and mucous membranes are normal.  Eyes: Conjunctivae, EOM and lids are normal. Pupils are equal, round, and reactive to light. Lids are everted and swept, no foreign bodies found.  Neck: Trachea normal and normal range of motion. Neck supple. Carotid bruit is not present. No mass and no thyromegaly present.  Cardiovascular: Normal rate, regular rhythm, S1 normal, S2 normal, normal heart sounds, intact distal pulses and normal pulses.  Exam reveals no gallop and no friction rub.   No murmur heard. Pulmonary/Chest: Effort normal and breath sounds normal. Not tachypneic. No respiratory distress. She has no decreased breath sounds. She has no wheezes. She has no rhonchi. She has no rales.  Abdominal: Soft. Normal appearance and bowel sounds are normal. There is no tenderness.  Musculoskeletal:       Left hip: She exhibits decreased range of motion and tenderness. She exhibits no bony tenderness.       Right knee: She exhibits decreased range of motion and abnormal meniscus. She exhibits no swelling and no effusion. Tenderness found. Medial joint line tenderness noted. No MCL and no LCL tenderness noted.       Left knee: She exhibits decreased range of motion. She exhibits no swelling, no bony tenderness and normal meniscus. Tenderness found. Medial joint line  tenderness noted.  ttp over left trochanter, pain with int and ext rotation of left hip  Neurological: She is alert.  Skin: Skin is warm, dry and intact. No rash noted.  Psychiatric: Her speech is normal and behavior is normal. Judgment and thought content normal. Her mood appears not anxious. Cognition and memory are normal. She does not exhibit a depressed mood.          Assessment &  Plan:

## 2014-02-28 NOTE — Assessment & Plan Note (Addendum)
Likely due to OA as well as possible foreign bodies on right seen in recent X-ray.  Continue meloxicam for inflammation and use tramadol for breakthrough pain. Referral to ortho.

## 2014-02-28 NOTE — Progress Notes (Signed)
Pre visit review using our clinic review tool, if applicable. No additional management support is needed unless otherwise documented below in the visit note. 

## 2014-02-28 NOTE — Patient Instructions (Signed)
Continue meloxicam during the day for pain and inflammation. Start hip stretching exercise. Stop at front desk for referral. Can use tramadol for breakthrough pain

## 2014-03-15 ENCOUNTER — Other Ambulatory Visit: Payer: Self-pay

## 2014-03-15 MED ORDER — ONDANSETRON HCL 8 MG PO TABS: 8.0000 mg | ORAL_TABLET | Freq: Three times a day (TID) | ORAL | Status: DC | PRN

## 2014-03-15 NOTE — Telephone Encounter (Signed)
Pt going on car trip on 03/16/14 late afternoon to evening and pt request ondansetron for car sickness (nausea). [employer].

## 2014-03-17 ENCOUNTER — Other Ambulatory Visit: Payer: Self-pay | Admitting: Family Medicine

## 2014-03-30 ENCOUNTER — Encounter: Payer: Self-pay | Admitting: Adult Health

## 2014-03-30 ENCOUNTER — Ambulatory Visit (INDEPENDENT_AMBULATORY_CARE_PROVIDER_SITE_OTHER): Payer: 59 | Admitting: Adult Health

## 2014-03-30 VITALS — BP 130/84 | HR 84 | Temp 98.1°F | Resp 14 | Wt 150.5 lb

## 2014-03-30 DIAGNOSIS — R05 Cough: Secondary | ICD-10-CM

## 2014-03-30 DIAGNOSIS — J0100 Acute maxillary sinusitis, unspecified: Secondary | ICD-10-CM

## 2014-03-30 DIAGNOSIS — R059 Cough, unspecified: Secondary | ICD-10-CM

## 2014-03-30 MED ORDER — AZITHROMYCIN 250 MG PO TABS: ORAL_TABLET | ORAL | Status: DC

## 2014-03-30 NOTE — Progress Notes (Signed)
Subjective:    Patient ID: Lisa Odom, female    DOB: Nov 02, 1955, 58 y.o.   MRN: [id number]  HPI  Pleasant 58 y/o female who presents with cough, congestion, and green productive sputum. Symptoms ongoing since last week ~ Wednesday, Thursday. Hoarse x 3 days. No fever or chills.   Past Medical History  Diagnosis Date  . Diverticulosis   . Hiatal hernia   . Gastritis   . Migraine   . Depression   . Anxiety   . GERD (gastroesophageal reflux disease)   . Contact lens/glasses fitting     wears contacts or glasses  . Diabetes mellitus without complication     Current Outpatient Prescriptions on File Prior to Visit  Medication Sig Dispense Refill  . ALPRAZolam (XANAX) 0.5 MG tablet Take 1 tablet by mouth 2 (two) times daily as needed.      Marland Kitchen amphetamine-dextroamphetamine (ADDERALL) 20 MG tablet Take 20 mg by mouth as needed.      Marland Kitchen amphetamine-dextroamphetamine (ADDERALL) 30 MG tablet Take 30 mg by mouth every morning.      Marland Kitchen aspirin 81 MG chewable tablet Chew by mouth daily.      Marland Kitchen aspirin-acetaminophen-caffeine (EXCEDRIN MIGRAINE) 250-250-65 MG per tablet Take 2 tablets by mouth every 6 (six) hours as needed. TAKES FOR MIGRAINES      . benzonatate (TESSALON) 200 MG capsule Take 1 capsule (200 mg total) by mouth 3 (three) times daily as needed for cough.  30 capsule  1  . FLUoxetine (PROZAC) 40 MG capsule Take 40 mg by mouth daily.      Marland Kitchen gabapentin (NEURONTIN) 300 MG capsule Take one tablet by mouth in the morning and at lunch.  Then two by mouth at bedtime  120 capsule  3  . glucose blood (ONE TOUCH ULTRA TEST) test strip Use to check blood sugar daily.  Dx: 250.60  100 each  3  . meloxicam (MOBIC) 15 MG tablet Take 1 tablet by mouth  daily  90 tablet  0  . Multiple Vitamin (MULTIVITAMIN) tablet Take 1 tablet by mouth daily.      . ondansetron (ZOFRAN) 8 MG tablet Take 1 tablet (8 mg total) by mouth every 8 (eight) hours as needed for nausea or vomiting.  20 tablet  0  . ONETOUCH  DELICA LANCETS 50Y MISC       . pantoprazole (PROTONIX) 40 MG tablet Take 1 tablet by mouth  daily  90 tablet  0  . traMADol (ULTRAM) 50 MG tablet Take 1 tablet (50 mg total) by mouth every 8 (eight) hours as needed for severe pain.  30 tablet  0   No current facility-administered medications on file prior to visit.     Review of Systems  Constitutional: Negative for fever and chills.  HENT: Positive for congestion, postnasal drip, rhinorrhea, sinus pressure (maxillary) and voice change. Negative for sore throat.        Green drainage from sinuses  Respiratory: Positive for cough (productive of green sputum).   All other systems reviewed and are negative.      Objective:   Physical Exam  Constitutional: She is oriented to person, place, and time. She appears well-developed and well-nourished. No distress.  HENT:  Head: Normocephalic and atraumatic.  Right Ear: External ear normal.  Left Ear: External ear normal.  Mouth/Throat: No oropharyngeal exudate.  Pharyngeal erythema. There is no exudate noted. She has drainage posterior pharynx.  Cardiovascular: Normal rate, regular rhythm and normal heart sounds.  Exam reveals no gallop.   No murmur heard. Pulmonary/Chest: Effort normal and breath sounds normal. No respiratory distress. She has no wheezes. She has no rales.  Musculoskeletal: Normal range of motion.  Lymphadenopathy:    She has cervical adenopathy (anterior cervical).  Neurological: She is alert and oriented to person, place, and time.  Psychiatric: She has a normal mood and affect. Her behavior is normal. Judgment and thought content normal.   BP 130/84  Pulse 84  Temp(Src) 98.1 F (36.7 C) (Oral)  Resp 14  Wt 150 lb 8 oz (68.266 kg)  SpO2 98%     Assessment & Plan:   1. Cough From post nasal drip. Has medication previously prescribed. Will take this at bedtime  2. Acute maxillary sinusitis, recurrence not specified Treat with Azithromycin. She is concerned  about developing bronchitis since has a hx of. Follow up with PCP if no improvement within 4-5 days.

## 2014-03-30 NOTE — Patient Instructions (Signed)
  Start Azithromycin today as directed.  Continue your cough medication at bedtime. Stay hydrated.  Voice rest for the hoarseness.  Tylenol for general discomfort.  Follow up with your PCP if your symptoms are not improved within 4-5 days or sooner if necessary.  Have a great July 4th!!

## 2014-03-30 NOTE — Progress Notes (Signed)
Pre visit review using our clinic review tool, if applicable. No additional management support is needed unless otherwise documented below in the visit note. 

## 2014-04-10 ENCOUNTER — Telehealth: Payer: Self-pay | Admitting: Family Medicine

## 2014-04-10 NOTE — Telephone Encounter (Signed)
Diabetic Bundle.  Pt needs lab appt to check LDL.  Left vm for pt to return call. 

## 2014-04-11 ENCOUNTER — Telehealth: Payer: Self-pay | Admitting: Family Medicine

## 2014-04-11 NOTE — Telephone Encounter (Signed)
Pt stated she received a call  She needs  Diabetic Bundle. Pt needs lab appt to check LDL Pt wants to go to [facility]  Pt would like to pick up order Please let pt know when order is ready  For pick up

## 2014-04-11 NOTE — Telephone Encounter (Signed)
Left message that lab order is ready to be picked up at front desk.

## 2014-04-11 NOTE — Telephone Encounter (Signed)
Rx in out box

## 2014-04-14 ENCOUNTER — Encounter: Payer: Self-pay | Admitting: Family Medicine

## 2014-05-09 ENCOUNTER — Encounter: Payer: Self-pay | Admitting: Family Medicine

## 2014-05-10 ENCOUNTER — Other Ambulatory Visit: Payer: Self-pay | Admitting: *Deleted

## 2014-05-10 NOTE — Telephone Encounter (Signed)
Called [facility]ie to give lab results.  She states she saw the specialist about her knee and he just told her to keep taking the Tramadol.  Requesting a refill be sent to her mail order.  Ok to refill?  Please print Rx so I can fax in to [facility].

## 2014-05-11 MED ORDER — TRAMADOL HCL 50 MG PO TABS: 50.0000 mg | ORAL_TABLET | Freq: Three times a day (TID) | ORAL | Status: DC | PRN

## 2014-05-11 NOTE — Telephone Encounter (Signed)
Left message for [place] that prescription has been faxed to [facility].

## 2014-05-11 NOTE — Telephone Encounter (Signed)
Rx faxed to [facility] [phone]

## 2014-05-15 ENCOUNTER — Telehealth: Payer: Self-pay | Admitting: *Deleted

## 2014-05-15 NOTE — Telephone Encounter (Signed)
Potential benefits outweigh risks. I personally have had thousands of patients on SSRI's, SNRI's + tramadol. The risk is there but low.  Electronically Signed  By: Owens Loffler, MD On: 05/15/2014 1:41 PM

## 2014-05-15 NOTE — Telephone Encounter (Signed)
Pharmacy send a clarification request re: pt's medication. She is currently being prescribed Cymbalta from Dr. Chucky May, which may interact with the tramadol prescribed here. Taking these medications together may cause serotonin syndrome. Pharmacy wants to know if you are aware of this, and would still like to continue pt's current therapy or if you want to change her current therapy? Form placed in your inbox.

## 2014-05-16 ENCOUNTER — Telehealth: Payer: Self-pay

## 2014-05-16 NOTE — Telephone Encounter (Signed)
Spoke with pt. No confusion, just fatigue and hard to focus. Recent new med from psychiatrist.. May be to strong for pt. Pt denied need for office visit. No clear sign seen needed for urgent exam. If unable to contact pt family members pt will rest then get home.

## 2014-05-16 NOTE — Telephone Encounter (Signed)
Pt came to front desk to ask question about lab testing and front desk clerk asked pt if she was OK; pt said she had falled in parking lot at [employer] when she was leaving work; pt said she feels dizzy, hard to focus and slight h/a,pt said she had already felt like this this morning prior to fall. Pt said she did not hit her head when she fell. Pt did not want to be seen but pt was slow in thinking (pt said she was like that a lot) Dr Diona Browner said she would speak with her.

## 2014-05-16 NOTE — Telephone Encounter (Signed)
Pt rested at office another 30 mins and felt some better and pt drove home. I have spoken with pt at her home and she did get home OK and is feeling better.

## 2014-05-16 NOTE — Telephone Encounter (Signed)
Agree, minimal risk. Continue current meds.

## 2014-05-16 NOTE — Telephone Encounter (Signed)
Form faxed back to pharmacy

## 2014-06-19 ENCOUNTER — Other Ambulatory Visit: Payer: Self-pay | Admitting: Family Medicine

## 2014-07-28 ENCOUNTER — Other Ambulatory Visit: Payer: Self-pay | Admitting: Family Medicine

## 2014-07-28 ENCOUNTER — Encounter: Payer: Self-pay | Admitting: Family Medicine

## 2014-07-28 ENCOUNTER — Ambulatory Visit (INDEPENDENT_AMBULATORY_CARE_PROVIDER_SITE_OTHER): Payer: 59 | Admitting: Family Medicine

## 2014-07-28 ENCOUNTER — Other Ambulatory Visit: Payer: Self-pay | Admitting: *Deleted

## 2014-07-28 VITALS — BP 120/80 | HR 88 | Temp 97.9°F | Ht 61.5 in | Wt 155.5 lb

## 2014-07-28 DIAGNOSIS — R5382 Chronic fatigue, unspecified: Secondary | ICD-10-CM

## 2014-07-28 DIAGNOSIS — R5383 Other fatigue: Secondary | ICD-10-CM | POA: Insufficient documentation

## 2014-07-28 DIAGNOSIS — R52 Pain, unspecified: Secondary | ICD-10-CM

## 2014-07-28 MED ORDER — GABAPENTIN 300 MG PO CAPS: ORAL_CAPSULE | ORAL | Status: DC

## 2014-07-28 NOTE — Progress Notes (Signed)
Pre visit review using our clinic review tool, if applicable. No additional management support is needed unless otherwise documented below in the visit note. 

## 2014-07-28 NOTE — Progress Notes (Signed)
   Subjective:    Patient ID: Lisa Odom, female    DOB: 12-25-1955, 58 y.o.   MRN: [id number]  Fall    58 year old female patient with history of depression and anxiety, DM with neuro manifestations, HTN presents with episodes of pain all over in last few years, more severe in last month.  Last few hours to days. Tylenol does not help. Pain is occuring every 8-10 days. She is very tired and weak.  She has fallen 2 times in parking lot at work lately. She lost balance , was not having an episode at the time.   Depression: improved on alprazolam ( every few days) and cymbalta 20 mg daily.  Dr Buddy Duty is following her for her mood. Givening her adderall for ADD.  She has peripheral neuropathy: she is on gabapentin.  Sister has fibromyalgia and lupus.      Review of Systems     Objective:   Physical Exam  Constitutional: Vital signs are normal. She appears well-developed and well-nourished. She is cooperative.  Non-toxic appearance. She does not appear ill. No distress.  Fatigued appearing, almost slurred speech  HENT:  Head: Normocephalic.  Right Ear: Hearing, tympanic membrane, external ear and ear canal normal. Tympanic membrane is not erythematous, not retracted and not bulging.  Left Ear: Hearing, tympanic membrane, external ear and ear canal normal. Tympanic membrane is not erythematous, not retracted and not bulging.  Nose: No mucosal edema or rhinorrhea. Right sinus exhibits no maxillary sinus tenderness and no frontal sinus tenderness. Left sinus exhibits no maxillary sinus tenderness and no frontal sinus tenderness.  Mouth/Throat: Uvula is midline, oropharynx is clear and moist and mucous membranes are normal.  Eyes: Conjunctivae, EOM and lids are normal. Pupils are equal, round, and reactive to light. Lids are everted and swept, no foreign bodies found.  Neck: Trachea normal and normal range of motion. Neck supple. Carotid bruit is not present. No mass and no thyromegaly  present.  Cardiovascular: Normal rate, regular rhythm, S1 normal, S2 normal, normal heart sounds, intact distal pulses and normal pulses.  Exam reveals no gallop and no friction rub.   No murmur heard. Pulmonary/Chest: Effort normal and breath sounds normal. Not tachypneic. No respiratory distress. She has no decreased breath sounds. She has no wheezes. She has no rhonchi. She has no rales.  Abdominal: Soft. Normal appearance and bowel sounds are normal. There is no tenderness.  Neurological: She is alert. She has normal strength. No cranial nerve deficit or sensory deficit. She displays a negative Romberg sign.  Trigger points 18/18  Skin: Skin is warm, dry and intact. No rash noted.  Psychiatric: Her speech is normal and behavior is normal. Judgment and thought content normal. Her mood appears not anxious. Cognition and memory are normal. She does not exhibit a depressed mood.          Assessment & Plan:

## 2014-07-28 NOTE — Telephone Encounter (Signed)
GABAPENTIN REFILL , OK

## 2014-07-28 NOTE — Assessment & Plan Note (Signed)
Eval with labs. May be secondary to medication and or depression/ fibromyalgia.

## 2014-07-28 NOTE — Addendum Note (Signed)
Addended by: Ellamae Sia on: 07/28/2014 05:20 PM   Modules accepted: Orders

## 2014-07-28 NOTE — Patient Instructions (Signed)
We will call with labs results.

## 2014-07-28 NOTE — Assessment & Plan Note (Signed)
Will eval with labs to rule out secondary causes.  physical exam is consistent with fibromyalgia with 18/18 trigger points.   If labs neg will recommend increasing cymbalta to 30-60 mg daily.

## 2014-07-31 ENCOUNTER — Other Ambulatory Visit (INDEPENDENT_AMBULATORY_CARE_PROVIDER_SITE_OTHER): Payer: 59

## 2014-07-31 DIAGNOSIS — M5410 Radiculopathy, site unspecified: Secondary | ICD-10-CM

## 2014-07-31 DIAGNOSIS — R5382 Chronic fatigue, unspecified: Secondary | ICD-10-CM

## 2014-07-31 DIAGNOSIS — R52 Pain, unspecified: Secondary | ICD-10-CM

## 2014-07-31 DIAGNOSIS — E1149 Type 2 diabetes mellitus with other diabetic neurological complication: Secondary | ICD-10-CM

## 2014-07-31 DIAGNOSIS — E7800 Pure hypercholesterolemia, unspecified: Secondary | ICD-10-CM

## 2014-08-01 ENCOUNTER — Other Ambulatory Visit: Payer: Self-pay | Admitting: *Deleted

## 2014-08-01 LAB — CBC WITH DIFFERENTIAL
BASOS ABS: 0 10*3/uL (ref 0.0–0.2)
Basos: 1 %
Eos: 4 %
Eosinophils Absolute: 0.3 10*3/uL (ref 0.0–0.4)
HEMATOCRIT: 37.8 % (ref 34.0–46.6)
Hemoglobin: 12.4 g/dL (ref 11.1–15.9)
Immature Grans (Abs): 0 10*3/uL (ref 0.0–0.1)
Immature Granulocytes: 0 %
LYMPHS ABS: 2.5 10*3/uL (ref 0.7–3.1)
Lymphs: 33 %
MCH: 27.6 pg (ref 26.6–33.0)
MCHC: 32.8 g/dL (ref 31.5–35.7)
MCV: 84 fL (ref 79–97)
MONOCYTES: 9 %
MONOS ABS: 0.7 10*3/uL (ref 0.1–0.9)
Neutrophils Absolute: 4.1 10*3/uL (ref 1.4–7.0)
Neutrophils Relative %: 53 %
Platelets: 404 10*3/uL — ABNORMAL HIGH (ref 150–379)
RBC: 4.5 x10E6/uL (ref 3.77–5.28)
RDW: 15 % (ref 12.3–15.4)
WBC: 7.6 10*3/uL (ref 3.4–10.8)

## 2014-08-01 LAB — COMPREHENSIVE METABOLIC PANEL
ALT: 45 IU/L — AB (ref 0–32)
AST: 36 IU/L (ref 0–40)
Albumin/Globulin Ratio: 1.4 (ref 1.1–2.5)
Albumin: 4 g/dL (ref 3.6–4.8)
Alkaline Phosphatase: 115 IU/L (ref 39–117)
BUN/Creatinine Ratio: 19 (ref 11–26)
BUN: 15 mg/dL (ref 8–27)
CALCIUM: 9.2 mg/dL (ref 8.7–10.3)
CHLORIDE: 101 mmol/L (ref 97–108)
CO2: 24 mmol/L (ref 18–29)
Creatinine, Ser: 0.79 mg/dL (ref 0.57–1.00)
GFR calc Af Amer: 94 mL/min/{1.73_m2} (ref >59)
GFR calc non Af Amer: 82 mL/min/{1.73_m2} (ref >59)
GLOBULIN, TOTAL: 2.8 g/dL (ref 1.5–4.5)
Glucose: 117 mg/dL — ABNORMAL HIGH (ref 65–99)
Potassium: 4.3 mmol/L (ref 3.5–5.2)
SODIUM: 141 mmol/L (ref 134–144)
Total Bilirubin: 0.2 mg/dL (ref 0.0–1.2)
Total Protein: 6.8 g/dL (ref 6.0–8.5)

## 2014-08-01 LAB — ANA: Anti Nuclear Antibody(ANA): NEGATIVE

## 2014-08-01 LAB — LYME, TOTAL AB TEST/REFLEX

## 2014-08-01 LAB — SEDIMENTATION RATE: SED RATE: 34 mm/h (ref 0–40)

## 2014-08-01 LAB — HEMOGLOBIN A1C
ESTIMATED AVERAGE GLUCOSE: 137 mg/dL
HEMOGLOBIN A1C: 6.4 % — AB (ref 4.8–5.6)

## 2014-08-01 LAB — LIPID PANEL
CHOL/HDL RATIO: 3.5 ratio (ref 0.0–4.4)
CHOLESTEROL TOTAL: 177 mg/dL (ref 100–199)
HDL: 51 mg/dL (ref >39)
LDL CALC: 110 mg/dL — AB (ref 0–99)
Triglycerides: 81 mg/dL (ref 0–149)
VLDL Cholesterol Cal: 16 mg/dL (ref 5–40)

## 2014-08-01 LAB — MICROALBUMIN / CREATININE URINE RATIO
CREATININE UR: 101.1 mg/dL (ref 15.0–278.0)
MICROALB/CREAT RATIO: 9.5 mg/g{creat} (ref 0.0–30.0)
MICROALBUM., U, RANDOM: 9.6 ug/mL (ref 0.0–17.0)

## 2014-08-01 LAB — TSH: TSH: 1.88 u[IU]/mL (ref 0.450–4.500)

## 2014-08-01 LAB — VITAMIN D 25 HYDROXY (VIT D DEFICIENCY, FRACTURES): Vit D, 25-Hydroxy: 24.7 ng/mL — ABNORMAL LOW (ref 30.0–100.0)

## 2014-08-01 LAB — CK: Total CK: 398 U/L — ABNORMAL HIGH (ref 24–173)

## 2014-08-01 MED ORDER — TRAMADOL HCL 50 MG PO TABS: 50.0000 mg | ORAL_TABLET | Freq: Three times a day (TID) | ORAL | Status: DC | PRN

## 2014-08-01 NOTE — Telephone Encounter (Signed)
Last office visit 07/28/2014.  Last refilled 05/11/2014 for #90 with no refills.  Ok to refill?  Please print to fax in to [facility].

## 2014-08-01 NOTE — Telephone Encounter (Signed)
Prescription faxed to [facility] [phone].

## 2014-08-03 ENCOUNTER — Telehealth: Payer: Self-pay | Admitting: *Deleted

## 2014-08-03 ENCOUNTER — Encounter: Payer: Self-pay | Admitting: Family Medicine

## 2014-08-03 DIAGNOSIS — R52 Pain, unspecified: Secondary | ICD-10-CM

## 2014-08-03 DIAGNOSIS — R748 Abnormal levels of other serum enzymes: Secondary | ICD-10-CM

## 2014-08-03 MED ORDER — VITAMIN D (ERGOCALCIFEROL) 1.25 MG (50000 UNIT) PO CAPS: 50000.0000 [IU] | ORAL_CAPSULE | ORAL | Status: DC

## 2014-08-03 NOTE — Telephone Encounter (Signed)
Left message for Larcenia to return my call. 

## 2014-08-03 NOTE — Telephone Encounter (Signed)
-----   Message from [name], MD sent at 08/03/2014  2:35 PM EST ----- Notify pt that cholesterol is much better than before, but blood sugar is elevated. Work on low [employer]. Mail info on prediabetes.  Nml kidney, one liver function test slightly elevated ( stop and tylenol or ETOH) we can recheck this in few weeks.  nml thyroid, no sign of autoimmune disease except muscle enzyme is elevated. Given her body pain. Recommend referral to rheumatology to eval further. Let me know if pt agreeable.  her vitr D is low.. sned in rx for vit D3 50,000 1 cap weekly x 12 weeks.  no lyme disease

## 2014-08-04 NOTE — Addendum Note (Signed)
Addended byEliezer Lofts E on: 08/04/2014 02:04 PM   Modules accepted: Orders

## 2014-08-04 NOTE — Telephone Encounter (Signed)
Ms. Mogle notified as instructed by telephone.  She is agreeable to the rheumatology referral.  Prediabetes hand out mailed.  Prescription for the Vit D sent to her pharmacy.

## 2014-08-17 ENCOUNTER — Ambulatory Visit (INDEPENDENT_AMBULATORY_CARE_PROVIDER_SITE_OTHER): Payer: 59 | Admitting: Family Medicine

## 2014-08-17 ENCOUNTER — Encounter: Payer: Self-pay | Admitting: Family Medicine

## 2014-08-17 VITALS — BP 131/77 | HR 87 | Temp 98.1°F | Ht 61.5 in | Wt 153.0 lb

## 2014-08-17 DIAGNOSIS — R748 Abnormal levels of other serum enzymes: Secondary | ICD-10-CM | POA: Insufficient documentation

## 2014-08-17 DIAGNOSIS — G8929 Other chronic pain: Secondary | ICD-10-CM | POA: Insufficient documentation

## 2014-08-17 DIAGNOSIS — M542 Cervicalgia: Secondary | ICD-10-CM

## 2014-08-17 DIAGNOSIS — R5382 Chronic fatigue, unspecified: Secondary | ICD-10-CM

## 2014-08-17 MED ORDER — HYDROCODONE-ACETAMINOPHEN 5-325 MG PO TABS: 1.0000 | ORAL_TABLET | Freq: Every day | ORAL | Status: DC | PRN

## 2014-08-17 NOTE — Progress Notes (Signed)
Subjective:    Patient ID: Lisa Odom, female    DOB: Jun 11, 1956, 58 y.o.   MRN: [id number]  HPI   58 year old female with recent evaluation for total body pain returns wit h progressively worsening neck and shoulder pain in last several months yesterday was so bad she had to leave work and spent day crying.  At last appt labs were normal excpet vit D low ( started vit D). Felt better initially then worse again.  Nml TSH,cbc nml ,sed rate nml.  CK was elevated nml.  she feels tired all over.  Pain does not shoot down arms.  She feels like dropping things, falling a lot.  tramadol and meloxicam does not help with pain.  I referred her to rheum but she has not seen them yet.Marland Kitchen appt in 12/7 She is out on FMLA for mood issues. Followed by Dr. Buddy Duty.  Started on cymbalta 20 mg daily in last 2-3 months.  Has had cervical pain in past.  X-ray in 2012: Moderate multilevel degenerative changes. Neural foraminal narrowing at C4-5 and C5-6 on the left and C-7 on the right.          Review of Systems  Constitutional: Positive for fatigue. Negative for fever.  HENT: Negative for ear pain.   Eyes: Negative for pain.  Respiratory: Negative for chest tightness and shortness of breath.   Cardiovascular: Negative for chest pain, palpitations and leg swelling.  Gastrointestinal: Negative for abdominal pain.  Genitourinary: Negative for dysuria.       Objective:   Physical Exam  Constitutional: Vital signs are normal. She appears well-developed and well-nourished. She is cooperative.  Non-toxic appearance. She does not appear ill. No distress.  Fatigued appearing, almost slurred speech  HENT:  Head: Normocephalic.  Right Ear: Hearing, tympanic membrane, external ear and ear canal normal. Tympanic membrane is not erythematous, not retracted and not bulging.  Left Ear: Hearing, tympanic membrane, external ear and ear canal normal. Tympanic membrane is not erythematous, not retracted  and not bulging.  Nose: No mucosal edema or rhinorrhea. Right sinus exhibits no maxillary sinus tenderness and no frontal sinus tenderness. Left sinus exhibits no maxillary sinus tenderness and no frontal sinus tenderness.  Mouth/Throat: Uvula is midline, oropharynx is clear and moist and mucous membranes are normal.  Eyes: Conjunctivae, EOM and lids are normal. Pupils are equal, round, and reactive to light. Lids are everted and swept, no foreign bodies found.  Neck: Trachea normal and normal range of motion. Neck supple. Carotid bruit is not present. No thyroid mass and no thyromegaly present.  Cardiovascular: Normal rate, regular rhythm, S1 normal, S2 normal, normal heart sounds, intact distal pulses and normal pulses.  Exam reveals no gallop and no friction rub.   No murmur heard. Pulmonary/Chest: Effort normal and breath sounds normal. No tachypnea. No respiratory distress. She has no decreased breath sounds. She has no wheezes. She has no rhonchi. She has no rales.  Abdominal: Soft. Normal appearance and bowel sounds are normal. There is no tenderness.  Musculoskeletal:       Cervical back: She exhibits decreased range of motion, tenderness and bony tenderness.  Very ttp at trapezius Bilaterally  Neurological: She is alert. She has normal strength. No cranial nerve deficit or sensory deficit. She displays a negative Romberg sign.  Trigger points 18/18  Skin: Skin is warm, dry and intact. No rash noted.  Psychiatric: Her speech is normal and behavior is normal. Judgment and thought content normal. Her  mood appears not anxious. Cognition and memory are normal. She does not exhibit a depressed mood.          Assessment & Plan:  Still most likely diagnosis fibromyalgia.  Now on vit d for deficiency which may be contributing with fatigue and pain. ? Significance of elevated CK.Marland Kitchen No clear reason for muscle breakdown. Will re-eval today and refer to rheum for further eval. Will have psych  increase cymbalta to 60 mg to treat mood as well as chronic pain. Alternatively, if not improving any further we could consider pt weaning off the multiple meds that cause sedation to try to improve fatigue. ( Pt is not open to this today)  Given primary pain in neck   And past X-ray showing degenerative changes will order and MRI of cervical spine to make sure no spinal stenosis causing muscle weakness. Given severe pain, cannot sleep, no improvement with NSAIDs or tramadol, will use vicodin for severe pain.

## 2014-08-17 NOTE — Patient Instructions (Addendum)
Stop at lab on way out for CK. Keep appt with rheum 12/7. Stop at front desk to set up MRI of neck  Increase cymbalta to 60 mg daily, run by D.r Buddy Duty before doing. Can use vicodin for breakthrough severe pain

## 2014-08-17 NOTE — Progress Notes (Signed)
Pre visit review using our clinic review tool, if applicable. No additional management support is needed unless otherwise documented below in the visit note. 

## 2014-08-19 LAB — CK: Total CK: 178 U/L — ABNORMAL HIGH (ref 24–173)

## 2014-08-27 ENCOUNTER — Ambulatory Visit
Admission: RE | Admit: 2014-08-27 | Discharge: 2014-08-27 | Disposition: A | Discharge status: Home / Self Care | Payer: 59 | Source: Ambulatory Visit | Attending: Family Medicine | Admitting: Family Medicine

## 2014-08-27 DIAGNOSIS — G8929 Other chronic pain: Secondary | ICD-10-CM

## 2014-08-27 DIAGNOSIS — M542 Cervicalgia: Principal | ICD-10-CM

## 2014-08-28 ENCOUNTER — Telehealth: Payer: Self-pay | Admitting: Family Medicine

## 2014-08-28 DIAGNOSIS — M4802 Spinal stenosis, cervical region: Secondary | ICD-10-CM

## 2014-08-28 NOTE — Telephone Encounter (Signed)
-----   Message from Carter Kitten, [place] sent at 08/28/2014  4:13 PM EST ----- Ms. Bello notified as instructed by telephone.  She would like to move forward with the referral to a neurosurgeon.

## 2014-09-04 ENCOUNTER — Other Ambulatory Visit: Payer: Self-pay | Admitting: Neurosurgery

## 2014-09-11 ENCOUNTER — Encounter (HOSPITAL_COMMUNITY)
Admission: RE | Admit: 2014-09-11 | Discharge: 2014-09-11 | Disposition: A | Discharge status: Home / Self Care | Payer: 59 | Source: Ambulatory Visit | Attending: Neurosurgery | Admitting: Neurosurgery

## 2014-09-11 ENCOUNTER — Encounter (HOSPITAL_COMMUNITY): Payer: Self-pay

## 2014-09-11 HISTORY — DX: Unspecified hemorrhoids: K64.9

## 2014-09-11 HISTORY — DX: Family history of other specified conditions: Z84.89

## 2014-09-11 HISTORY — DX: Panic disorder (episodic paroxysmal anxiety): F41.0

## 2014-09-11 LAB — SURGICAL PCR SCREEN
MRSA, PCR: NEGATIVE
STAPHYLOCOCCUS AUREUS: NEGATIVE

## 2014-09-11 LAB — BASIC METABOLIC PANEL
ANION GAP: 14 (ref 5–15)
BUN: 13 mg/dL (ref 6–23)
CO2: 24 mEq/L (ref 19–32)
CREATININE: 0.67 mg/dL (ref 0.50–1.10)
Calcium: 9.5 mg/dL (ref 8.4–10.5)
Chloride: 100 mEq/L (ref 96–112)
Glucose, Bld: 86 mg/dL (ref 70–99)
Potassium: 4.1 mEq/L (ref 3.7–5.3)
Sodium: 138 mEq/L (ref 137–147)

## 2014-09-11 LAB — CBC WITH DIFFERENTIAL/PLATELET
BASOS PCT: 0 % (ref 0–1)
Basophils Absolute: 0 10*3/uL (ref 0.0–0.1)
Eosinophils Absolute: 0.1 10*3/uL (ref 0.0–0.7)
Eosinophils Relative: 2 % (ref 0–5)
HCT: 40.1 % (ref 36.0–46.0)
Hemoglobin: 12.9 g/dL (ref 12.0–15.0)
Lymphocytes Relative: 37 % (ref 12–46)
Lymphs Abs: 2.8 10*3/uL (ref 0.7–4.0)
MCH: 27.3 pg (ref 26.0–34.0)
MCHC: 32.2 g/dL (ref 30.0–36.0)
MCV: 84.8 fL (ref 78.0–100.0)
Monocytes Absolute: 0.4 10*3/uL (ref 0.1–1.0)
Monocytes Relative: 5 % (ref 3–12)
NEUTROS ABS: 4.3 10*3/uL (ref 1.7–7.7)
NEUTROS PCT: 56 % (ref 43–77)
Platelets: 406 10*3/uL — ABNORMAL HIGH (ref 150–400)
RBC: 4.73 MIL/uL (ref 3.87–5.11)
RDW: 14.9 % (ref 11.5–15.5)
WBC: 7.6 10*3/uL (ref 4.0–10.5)

## 2014-09-11 MED ORDER — DEXAMETHASONE SODIUM PHOSPHATE 10 MG/ML IJ SOLN
10.0000 mg | INTRAMUSCULAR | Status: AC
  Administered 2014-09-12: 10 mg via INTRAVENOUS
  Filled 2014-09-11: qty 1

## 2014-09-11 MED ORDER — CEFAZOLIN SODIUM-DEXTROSE 2-3 GM-% IV SOLR
2.0000 g | INTRAVENOUS | Status: AC
  Administered 2014-09-12: 2 g via INTRAVENOUS
  Filled 2014-09-11: qty 50

## 2014-09-11 NOTE — Pre-Procedure Instructions (Signed)
Ileen Kahre  09/11/2014   Your procedure is scheduled on:  Tuesday September 12, 2014 at 7:34 AM.  Report to [facility] Admitting at 5:30 AM.  Call this number if you have problems the morning of surgery: [phone]   For any other questions call this number M-F from 8am-4pm: [phone]   Remember:   Do not eat food or drink liquids after midnight.   Take these medicines the morning of surgery with A SIP OF WATER: Alprazolam (Xanax) if needed, Adderall, Duloxetine (Cymbalta), Gabapentin (Neurontin), Hydrocodone if needed, Ondansetron (Zofran) if needed, Pantoprazole (Protonix), Tramadol (Ultram) if needed   Do NOT take any diabetic medications the morning of your surgery   Do not wear jewelry, make-up or nail polish.  Do not wear lotions, powders, or perfumes.   Do not shave 48 hours prior to surgery.   Do not bring valuables to the hospital.  [facility] is not responsible for any belongings or valuables.               Contacts, dentures or bridgework may not be worn into surgery.  Leave suitcase in the car. After surgery it may be brought to your room.  For patients admitted to the hospital, discharge time is determined by your treatment team.               Patients discharged the day of surgery will not be allowed to drive home.  Name and phone number of your driver:   Special Instructions: Shower using CHG soap the night before and the morning of your surgery   Please read over the following fact sheets that you were given: Pain Booklet, Coughing and Deep Breathing, MRSA Information and Surgical Site Infection Prevention

## 2014-09-12 ENCOUNTER — Encounter (HOSPITAL_COMMUNITY): Payer: Self-pay | Admitting: *Deleted

## 2014-09-12 ENCOUNTER — Inpatient Hospital Stay (HOSPITAL_COMMUNITY): Payer: 59 | Admitting: Anesthesiology

## 2014-09-12 ENCOUNTER — Encounter (HOSPITAL_COMMUNITY)
Admission: RE | Disposition: A | Discharge status: Home / Self Care | Payer: Self-pay | Source: Ambulatory Visit | Attending: Neurosurgery

## 2014-09-12 ENCOUNTER — Inpatient Hospital Stay (HOSPITAL_COMMUNITY): Payer: 59 | Admitting: Certified Registered"

## 2014-09-12 ENCOUNTER — Inpatient Hospital Stay (HOSPITAL_COMMUNITY)
Admission: RE | Admit: 2014-09-12 | Discharge: 2014-09-14 | DRG: 472 | Disposition: A | Discharge status: Home / Self Care | Payer: 59 | Source: Ambulatory Visit | Attending: Neurosurgery | Admitting: Neurosurgery

## 2014-09-12 ENCOUNTER — Inpatient Hospital Stay (HOSPITAL_COMMUNITY): Payer: 59

## 2014-09-12 DIAGNOSIS — Z833 Family history of diabetes mellitus: Secondary | ICD-10-CM

## 2014-09-12 DIAGNOSIS — K219 Gastro-esophageal reflux disease without esophagitis: Secondary | ICD-10-CM | POA: Diagnosis present

## 2014-09-12 DIAGNOSIS — R0609 Other forms of dyspnea: Secondary | ICD-10-CM | POA: Diagnosis not present

## 2014-09-12 DIAGNOSIS — F410 Panic disorder [episodic paroxysmal anxiety] without agoraphobia: Secondary | ICD-10-CM | POA: Diagnosis present

## 2014-09-12 DIAGNOSIS — M4712 Other spondylosis with myelopathy, cervical region: Secondary | ICD-10-CM | POA: Diagnosis present

## 2014-09-12 DIAGNOSIS — E872 Acidosis: Secondary | ICD-10-CM | POA: Diagnosis not present

## 2014-09-12 DIAGNOSIS — F418 Other specified anxiety disorders: Secondary | ICD-10-CM | POA: Diagnosis present

## 2014-09-12 DIAGNOSIS — M9901 Segmental and somatic dysfunction of cervical region: Secondary | ICD-10-CM

## 2014-09-12 DIAGNOSIS — J9602 Acute respiratory failure with hypercapnia: Secondary | ICD-10-CM | POA: Insufficient documentation

## 2014-09-12 DIAGNOSIS — M96830 Postprocedural hemorrhage and hematoma of a musculoskeletal structure following a musculoskeletal system procedure: Secondary | ICD-10-CM | POA: Diagnosis not present

## 2014-09-12 DIAGNOSIS — Z7982 Long term (current) use of aspirin: Secondary | ICD-10-CM | POA: Diagnosis not present

## 2014-09-12 DIAGNOSIS — E119 Type 2 diabetes mellitus without complications: Secondary | ICD-10-CM | POA: Diagnosis present

## 2014-09-12 DIAGNOSIS — M9909 Segmental and somatic dysfunction of abdomen and other regions: Secondary | ICD-10-CM | POA: Diagnosis present

## 2014-09-12 DIAGNOSIS — M4802 Spinal stenosis, cervical region: Secondary | ICD-10-CM | POA: Diagnosis present

## 2014-09-12 DIAGNOSIS — Y838 Other surgical procedures as the cause of abnormal reaction of the patient, or of later complication, without mention of misadventure at the time of the procedure: Secondary | ICD-10-CM | POA: Diagnosis not present

## 2014-09-12 DIAGNOSIS — M542 Cervicalgia: Secondary | ICD-10-CM | POA: Diagnosis present

## 2014-09-12 DIAGNOSIS — G992 Myelopathy in diseases classified elsewhere: Secondary | ICD-10-CM | POA: Diagnosis present

## 2014-09-12 HISTORY — PX: ANTERIOR CERVICAL DECOMP/DISCECTOMY FUSION: SHX1161

## 2014-09-12 LAB — CBC WITH DIFFERENTIAL/PLATELET
BASOS PCT: 0 % (ref 0–1)
Basophils Absolute: 0 10*3/uL (ref 0.0–0.1)
Eosinophils Absolute: 0 10*3/uL (ref 0.0–0.7)
Eosinophils Relative: 0 % (ref 0–5)
HCT: 34.8 % — ABNORMAL LOW (ref 36.0–46.0)
Hemoglobin: 11.1 g/dL — ABNORMAL LOW (ref 12.0–15.0)
LYMPHS PCT: 7 % — AB (ref 12–46)
Lymphs Abs: 1 10*3/uL (ref 0.7–4.0)
MCH: 27.3 pg (ref 26.0–34.0)
MCHC: 31.9 g/dL (ref 30.0–36.0)
MCV: 85.7 fL (ref 78.0–100.0)
Monocytes Absolute: 0.6 10*3/uL (ref 0.1–1.0)
Monocytes Relative: 4 % (ref 3–12)
Neutro Abs: 12.5 10*3/uL — ABNORMAL HIGH (ref 1.7–7.7)
Neutrophils Relative %: 89 % — ABNORMAL HIGH (ref 43–77)
PLATELETS: 304 10*3/uL (ref 150–400)
RBC: 4.06 MIL/uL (ref 3.87–5.11)
RDW: 15 % (ref 11.5–15.5)
WBC: 14.1 10*3/uL — AB (ref 4.0–10.5)

## 2014-09-12 LAB — BLOOD GAS, ARTERIAL
ACID-BASE EXCESS: 0 mmol/L (ref 0.0–2.0)
BICARBONATE: 25.2 meq/L — AB (ref 20.0–24.0)
Drawn by: 39899
FIO2: 0.5 %
O2 Saturation: 97.5 %
PCO2 ART: 47.4 mmHg — AB (ref 35.0–45.0)
PEEP: 5 cmH2O
PH ART: 7.341 — AB (ref 7.350–7.450)
Patient temperature: 97.5
RATE: 16 resp/min
TCO2: 26.7 mmol/L (ref 0–100)
VT: 400 mL
pO2, Arterial: 98.9 mmHg (ref 80.0–100.0)

## 2014-09-12 LAB — PHOSPHORUS: PHOSPHORUS: 3.7 mg/dL (ref 2.3–4.6)

## 2014-09-12 LAB — BASIC METABOLIC PANEL
ANION GAP: 11 (ref 5–15)
BUN: 12 mg/dL (ref 6–23)
CALCIUM: 8.7 mg/dL (ref 8.4–10.5)
CO2: 26 mEq/L (ref 19–32)
Chloride: 102 mEq/L (ref 96–112)
Creatinine, Ser: 0.57 mg/dL (ref 0.50–1.10)
Glucose, Bld: 132 mg/dL — ABNORMAL HIGH (ref 70–99)
Potassium: 5.7 mEq/L — ABNORMAL HIGH (ref 3.7–5.3)
SODIUM: 139 meq/L (ref 137–147)

## 2014-09-12 LAB — GLUCOSE, CAPILLARY
GLUCOSE-CAPILLARY: 114 mg/dL — AB (ref 70–99)
Glucose-Capillary: 118 mg/dL — ABNORMAL HIGH (ref 70–99)
Glucose-Capillary: 128 mg/dL — ABNORMAL HIGH (ref 70–99)

## 2014-09-12 LAB — MAGNESIUM: Magnesium: 2 mg/dL (ref 1.5–2.5)

## 2014-09-12 LAB — PROTIME-INR
INR: 1.09 (ref 0.00–1.49)
PROTHROMBIN TIME: 14.2 s (ref 11.6–15.2)

## 2014-09-12 LAB — APTT: APTT: 25 s (ref 24–37)

## 2014-09-12 SURGERY — ANTERIOR CERVICAL DECOMPRESSION/DISCECTOMY FUSION 1 LEVEL
Anesthesia: General

## 2014-09-12 SURGERY — ANTERIOR CERVICAL DECOMPRESSION/DISCECTOMY FUSION 3 LEVELS
Anesthesia: General | Site: Spine Cervical

## 2014-09-12 MED ORDER — PHENOL 1.4 % MT LIQD: 1.0000 | OROMUCOSAL | Status: DC | PRN

## 2014-09-12 MED ORDER — PHENYLEPHRINE 40 MCG/ML (10ML) SYRINGE FOR IV PUSH (FOR BLOOD PRESSURE SUPPORT)
PREFILLED_SYRINGE | INTRAVENOUS | Status: AC
  Filled 2014-09-12: qty 10

## 2014-09-12 MED ORDER — DEXAMETHASONE SODIUM PHOSPHATE 10 MG/ML IJ SOLN
INTRAMUSCULAR | Status: DC | PRN
  Administered 2014-09-12: 10 mg via INTRAVENOUS

## 2014-09-12 MED ORDER — DEXAMETHASONE SODIUM PHOSPHATE 10 MG/ML IJ SOLN
INTRAMUSCULAR | Status: AC
  Filled 2014-09-12: qty 1

## 2014-09-12 MED ORDER — NEOSTIGMINE METHYLSULFATE 10 MG/10ML IV SOLN
INTRAVENOUS | Status: AC
  Filled 2014-09-12: qty 2

## 2014-09-12 MED ORDER — CEFAZOLIN SODIUM-DEXTROSE 2-3 GM-% IV SOLR
INTRAVENOUS | Status: AC
  Filled 2014-09-12: qty 50

## 2014-09-12 MED ORDER — HYDROCODONE-ACETAMINOPHEN 5-325 MG PO TABS: 1.0000 | ORAL_TABLET | ORAL | Status: DC | PRN

## 2014-09-12 MED ORDER — ROCURONIUM BROMIDE 50 MG/5ML IV SOLN
INTRAVENOUS | Status: AC
  Filled 2014-09-12: qty 1

## 2014-09-12 MED ORDER — CHLORHEXIDINE GLUCONATE 0.12 % MT SOLN
15.0000 mL | Freq: Two times a day (BID) | OROMUCOSAL | Status: DC
  Administered 2014-09-12 – 2014-09-13 (×2): 15 mL via OROMUCOSAL
  Filled 2014-09-12: qty 15

## 2014-09-12 MED ORDER — PROPOFOL 10 MG/ML IV BOLUS
INTRAVENOUS | Status: DC | PRN
  Administered 2014-09-12: 140 mg via INTRAVENOUS

## 2014-09-12 MED ORDER — PROPOFOL 10 MG/ML IV BOLUS
INTRAVENOUS | Status: AC
  Filled 2014-09-12: qty 20

## 2014-09-12 MED ORDER — SENNA 8.6 MG PO TABS
1.0000 | ORAL_TABLET | Freq: Two times a day (BID) | ORAL | Status: DC
  Administered 2014-09-12 – 2014-09-14 (×3): 8.6 mg via ORAL
  Filled 2014-09-12 (×5): qty 1

## 2014-09-12 MED ORDER — DULOXETINE HCL 60 MG PO CPEP
120.0000 mg | ORAL_CAPSULE | Freq: Every day | ORAL | Status: DC
  Administered 2014-09-14: 120 mg via ORAL
  Filled 2014-09-12 (×3): qty 2

## 2014-09-12 MED ORDER — MENTHOL 3 MG MT LOZG: 1.0000 | LOZENGE | OROMUCOSAL | Status: DC | PRN

## 2014-09-12 MED ORDER — FENTANYL CITRATE 0.05 MG/ML IJ SOLN
INTRAMUSCULAR | Status: AC
  Filled 2014-09-12: qty 5

## 2014-09-12 MED ORDER — SUCCINYLCHOLINE CHLORIDE 20 MG/ML IJ SOLN
INTRAMUSCULAR | Status: AC
  Filled 2014-09-12: qty 1

## 2014-09-12 MED ORDER — EPHEDRINE SULFATE 50 MG/ML IJ SOLN
INTRAMUSCULAR | Status: AC
Start: 2014-09-12 — End: 2014-09-12
  Filled 2014-09-12: qty 1

## 2014-09-12 MED ORDER — ONDANSETRON HCL 4 MG/2ML IJ SOLN
INTRAMUSCULAR | Status: AC
  Filled 2014-09-12: qty 2

## 2014-09-12 MED ORDER — ALUM & MAG HYDROXIDE-SIMETH 200-200-20 MG/5ML PO SUSP: 30.0000 mL | Freq: Four times a day (QID) | ORAL | Status: DC | PRN

## 2014-09-12 MED ORDER — CYCLOBENZAPRINE HCL 10 MG PO TABS
10.0000 mg | ORAL_TABLET | Freq: Three times a day (TID) | ORAL | Status: DC | PRN
Start: 2014-09-12 — End: 2014-09-14
  Administered 2014-09-12: 10 mg via ORAL
  Filled 2014-09-12: qty 1

## 2014-09-12 MED ORDER — LACTATED RINGERS IV SOLN
INTRAVENOUS | Status: DC | PRN
Start: 2014-09-12 — End: 2014-09-12
  Administered 2014-09-12: 16:00:00 via INTRAVENOUS

## 2014-09-12 MED ORDER — CYCLOBENZAPRINE HCL 10 MG PO TABS
ORAL_TABLET | ORAL | Status: AC
  Filled 2014-09-12: qty 1

## 2014-09-12 MED ORDER — PHENYLEPHRINE HCL 10 MG/ML IJ SOLN
INTRAMUSCULAR | Status: DC | PRN
  Administered 2014-09-12 (×2): 120 ug via INTRAVENOUS

## 2014-09-12 MED ORDER — CEFAZOLIN SODIUM-DEXTROSE 2-3 GM-% IV SOLR
2.0000 g | Freq: Three times a day (TID) | INTRAVENOUS | Status: DC
  Administered 2014-09-12 – 2014-09-14 (×5): 2 g via INTRAVENOUS
  Filled 2014-09-12 (×7): qty 50

## 2014-09-12 MED ORDER — AMPHETAMINE-DEXTROAMPHETAMINE 10 MG PO TABS
20.0000 mg | ORAL_TABLET | Freq: Every day | ORAL | Status: DC
  Filled 2014-09-12: qty 2

## 2014-09-12 MED ORDER — OXYCODONE HCL 5 MG PO TABS: 5.0000 mg | ORAL_TABLET | Freq: Once | ORAL | Status: AC | PRN

## 2014-09-12 MED ORDER — ACETAMINOPHEN 325 MG PO TABS: 650.0000 mg | ORAL_TABLET | ORAL | Status: DC | PRN

## 2014-09-12 MED ORDER — GLYCOPYRROLATE 0.2 MG/ML IJ SOLN
INTRAMUSCULAR | Status: AC
  Filled 2014-09-12: qty 3

## 2014-09-12 MED ORDER — PROMETHAZINE HCL 25 MG/ML IJ SOLN: 6.2500 mg | INTRAMUSCULAR | Status: DC | PRN

## 2014-09-12 MED ORDER — ONDANSETRON HCL 4 MG/2ML IJ SOLN
INTRAMUSCULAR | Status: DC | PRN
  Administered 2014-09-12: 4 mg via INTRAVENOUS

## 2014-09-12 MED ORDER — GELATIN ABSORBABLE 100 EX MISC
CUTANEOUS | Status: DC | PRN
  Administered 2014-09-12: 08:00:00 via TOPICAL

## 2014-09-12 MED ORDER — OXYCODONE HCL 5 MG PO TABS: 5.0000 mg | ORAL_TABLET | Freq: Once | ORAL | Status: DC | PRN

## 2014-09-12 MED ORDER — ALPRAZOLAM 0.5 MG PO TABS: 1.0000 mg | ORAL_TABLET | Freq: Two times a day (BID) | ORAL | Status: DC | PRN

## 2014-09-12 MED ORDER — ROCURONIUM BROMIDE 100 MG/10ML IV SOLN
INTRAVENOUS | Status: DC | PRN
  Administered 2014-09-12: 30 mg via INTRAVENOUS

## 2014-09-12 MED ORDER — LIDOCAINE HCL (CARDIAC) 20 MG/ML IV SOLN
INTRAVENOUS | Status: DC | PRN
  Administered 2014-09-12: 50 mg via INTRAVENOUS

## 2014-09-12 MED ORDER — SODIUM CHLORIDE 0.9 % IJ SOLN
3.0000 mL | Freq: Two times a day (BID) | INTRAMUSCULAR | Status: DC
  Administered 2014-09-13 (×2): 3 mL via INTRAVENOUS

## 2014-09-12 MED ORDER — SODIUM CHLORIDE 0.9 % IJ SOLN: 3.0000 mL | INTRAMUSCULAR | Status: DC | PRN

## 2014-09-12 MED ORDER — HYDROMORPHONE HCL 1 MG/ML IJ SOLN
INTRAMUSCULAR | Status: AC
  Administered 2014-09-12: 0.5 mg via INTRAVENOUS
  Filled 2014-09-12: qty 1

## 2014-09-12 MED ORDER — OXYCODONE HCL 5 MG/5ML PO SOLN: 5.0000 mg | Freq: Once | ORAL | Status: DC | PRN

## 2014-09-12 MED ORDER — PROPOFOL 10 MG/ML IV BOLUS
INTRAVENOUS | Status: DC | PRN
  Administered 2014-09-12: 70 mg via INTRAVENOUS

## 2014-09-12 MED ORDER — ADULT MULTIVITAMIN W/MINERALS CH
1.0000 | ORAL_TABLET | Freq: Every day | ORAL | Status: DC
  Administered 2014-09-14: 1 via ORAL
  Filled 2014-09-12 (×3): qty 1

## 2014-09-12 MED ORDER — CEFAZOLIN SODIUM-DEXTROSE 2-3 GM-% IV SOLR
INTRAVENOUS | Status: DC | PRN
  Administered 2014-09-12: 2 g via INTRAVENOUS

## 2014-09-12 MED ORDER — LIDOCAINE HCL (CARDIAC) 20 MG/ML IV SOLN
INTRAVENOUS | Status: DC | PRN
  Administered 2014-09-12: 80 mg via INTRAVENOUS

## 2014-09-12 MED ORDER — PROPOFOL 10 MG/ML IV EMUL
0.0000 ug/kg/min | INTRAVENOUS | Status: DC
Start: 2014-09-12 — End: 2014-09-13
  Administered 2014-09-12: 20 ug/kg/min via INTRAVENOUS
  Administered 2014-09-12: 35 ug/kg/min via INTRAVENOUS
  Administered 2014-09-12: 30.882 ug/kg/min via INTRAVENOUS
  Administered 2014-09-13: 30 ug/kg/min via INTRAVENOUS
  Filled 2014-09-12 (×3): qty 100

## 2014-09-12 MED ORDER — OXYCODONE-ACETAMINOPHEN 5-325 MG PO TABS
1.0000 | ORAL_TABLET | ORAL | Status: DC | PRN
  Administered 2014-09-13 – 2014-09-14 (×3): 2 via ORAL
  Filled 2014-09-12 (×3): qty 2

## 2014-09-12 MED ORDER — ONDANSETRON HCL 4 MG/2ML IJ SOLN: 4.0000 mg | INTRAMUSCULAR | Status: DC | PRN

## 2014-09-12 MED ORDER — INSULIN ASPART 100 UNIT/ML ~~LOC~~ SOLN: 0.0000 [IU] | SUBCUTANEOUS | Status: DC

## 2014-09-12 MED ORDER — SODIUM CHLORIDE 0.9 % IJ SOLN
INTRAMUSCULAR | Status: AC
  Filled 2014-09-12: qty 10

## 2014-09-12 MED ORDER — ROCURONIUM BROMIDE 50 MG/5ML IV SOLN
INTRAVENOUS | Status: AC
  Filled 2014-09-12: qty 2

## 2014-09-12 MED ORDER — LIDOCAINE HCL (CARDIAC) 20 MG/ML IV SOLN
INTRAVENOUS | Status: AC
  Filled 2014-09-12: qty 5

## 2014-09-12 MED ORDER — NEOSTIGMINE METHYLSULFATE 10 MG/10ML IV SOLN
INTRAVENOUS | Status: DC | PRN
Start: 2014-09-12 — End: 2014-09-12
  Administered 2014-09-12: 3 mg via INTRAVENOUS

## 2014-09-12 MED ORDER — SODIUM CHLORIDE 0.9 % IR SOLN
Status: DC | PRN
  Administered 2014-09-12: 08:00:00

## 2014-09-12 MED ORDER — SODIUM CHLORIDE 0.9 % IV SOLN: 250.0000 mL | INTRAVENOUS | Status: DC

## 2014-09-12 MED ORDER — ARTIFICIAL TEARS OP OINT
TOPICAL_OINTMENT | OPHTHALMIC | Status: AC
  Filled 2014-09-12: qty 3.5

## 2014-09-12 MED ORDER — ONE-DAILY MULTI VITAMINS PO TABS: 1.0000 | ORAL_TABLET | Freq: Every day | ORAL | Status: DC

## 2014-09-12 MED ORDER — ASPIRIN 81 MG PO CHEW
81.0000 mg | CHEWABLE_TABLET | Freq: Every day | ORAL | Status: DC
  Filled 2014-09-12: qty 1

## 2014-09-12 MED ORDER — 0.9 % SODIUM CHLORIDE (POUR BTL) OPTIME
TOPICAL | Status: DC | PRN
  Administered 2014-09-12: 1000 mL

## 2014-09-12 MED ORDER — CEFAZOLIN SODIUM 1-5 GM-% IV SOLN
1.0000 g | Freq: Three times a day (TID) | INTRAVENOUS | Status: DC
  Filled 2014-09-12 (×2): qty 50

## 2014-09-12 MED ORDER — MIDAZOLAM HCL 5 MG/5ML IJ SOLN
INTRAMUSCULAR | Status: DC | PRN
  Administered 2014-09-12: .5 mg via INTRAVENOUS
  Administered 2014-09-12 (×3): 0.5 mg via INTRAVENOUS

## 2014-09-12 MED ORDER — CETYLPYRIDINIUM CHLORIDE 0.05 % MT LIQD
7.0000 mL | Freq: Four times a day (QID) | OROMUCOSAL | Status: DC
  Administered 2014-09-13 (×2): 7 mL via OROMUCOSAL

## 2014-09-12 MED ORDER — GLYCOPYRROLATE 0.2 MG/ML IJ SOLN
INTRAMUSCULAR | Status: DC | PRN
  Administered 2014-09-12: 0.6 mg via INTRAVENOUS

## 2014-09-12 MED ORDER — ROCURONIUM BROMIDE 100 MG/10ML IV SOLN
INTRAVENOUS | Status: DC | PRN
  Administered 2014-09-12: 50 mg via INTRAVENOUS

## 2014-09-12 MED ORDER — MIDAZOLAM HCL 5 MG/5ML IJ SOLN
INTRAMUSCULAR | Status: DC | PRN
  Administered 2014-09-12: 1 mg via INTRAVENOUS

## 2014-09-12 MED ORDER — TRAMADOL HCL 50 MG PO TABS
50.0000 mg | ORAL_TABLET | Freq: Three times a day (TID) | ORAL | Status: DC | PRN
  Administered 2014-09-13 – 2014-09-14 (×2): 50 mg via ORAL
  Filled 2014-09-12 (×2): qty 1

## 2014-09-12 MED ORDER — METHOCARBAMOL 500 MG PO TABS
500.0000 mg | ORAL_TABLET | Freq: Every day | ORAL | Status: DC | PRN
  Filled 2014-09-12: qty 1

## 2014-09-12 MED ORDER — ACETAMINOPHEN 650 MG RE SUPP: 650.0000 mg | RECTAL | Status: DC | PRN

## 2014-09-12 MED ORDER — PANTOPRAZOLE SODIUM 40 MG PO TBEC: 40.0000 mg | DELAYED_RELEASE_TABLET | Freq: Every day | ORAL | Status: DC

## 2014-09-12 MED ORDER — ONDANSETRON HCL 4 MG PO TABS: 8.0000 mg | ORAL_TABLET | Freq: Three times a day (TID) | ORAL | Status: DC | PRN

## 2014-09-12 MED ORDER — AMPHETAMINE-DEXTROAMPHETAMINE 10 MG PO TABS
30.0000 mg | ORAL_TABLET | Freq: Every day | ORAL | Status: DC
  Administered 2014-09-14: 30 mg via ORAL
  Filled 2014-09-12 (×2): qty 3

## 2014-09-12 MED ORDER — OXYCODONE HCL 5 MG/5ML PO SOLN: 5.0000 mg | Freq: Once | ORAL | Status: AC | PRN

## 2014-09-12 MED ORDER — HYDROMORPHONE HCL 1 MG/ML IJ SOLN: 0.2500 mg | INTRAMUSCULAR | Status: DC | PRN

## 2014-09-12 MED ORDER — BIOTIN 5 MG PO TABS: 1.0000 | ORAL_TABLET | ORAL | Status: DC

## 2014-09-12 MED ORDER — VANCOMYCIN HCL 1000 MG IV SOLR
INTRAVENOUS | Status: AC
  Filled 2014-09-12: qty 1000

## 2014-09-12 MED ORDER — MIDAZOLAM HCL 2 MG/2ML IJ SOLN
INTRAMUSCULAR | Status: AC
  Filled 2014-09-12: qty 2

## 2014-09-12 MED ORDER — FENTANYL CITRATE 0.05 MG/ML IJ SOLN
INTRAMUSCULAR | Status: DC | PRN
  Administered 2014-09-12: 25 ug via INTRAVENOUS
  Administered 2014-09-12: 50 ug via INTRAVENOUS
  Administered 2014-09-12: 100 ug via INTRAVENOUS
  Administered 2014-09-12: 75 ug via INTRAVENOUS

## 2014-09-12 MED ORDER — HYDROMORPHONE HCL 1 MG/ML IJ SOLN
0.2500 mg | INTRAMUSCULAR | Status: DC | PRN
  Administered 2014-09-12 (×3): 0.5 mg via INTRAVENOUS

## 2014-09-12 MED ORDER — NEOSTIGMINE METHYLSULFATE 10 MG/10ML IV SOLN
INTRAVENOUS | Status: DC | PRN
  Administered 2014-09-12: 4 mg via INTRAVENOUS

## 2014-09-12 MED ORDER — SODIUM CHLORIDE 0.9 % IV SOLN
INTRAVENOUS | Status: DC
  Administered 2014-09-12: 19:00:00 via INTRAVENOUS

## 2014-09-12 MED ORDER — PHENYLEPHRINE HCL 10 MG/ML IJ SOLN
10.0000 mg | INTRAVENOUS | Status: DC | PRN
  Administered 2014-09-12: 40 ug/min via INTRAVENOUS

## 2014-09-12 MED ORDER — LACTATED RINGERS IV SOLN
INTRAVENOUS | Status: DC | PRN
  Administered 2014-09-12 (×2): via INTRAVENOUS

## 2014-09-12 MED ORDER — THROMBIN 5000 UNITS EX SOLR
OROMUCOSAL | Status: DC | PRN
  Administered 2014-09-12: 10:00:00 via TOPICAL

## 2014-09-12 MED ORDER — GABAPENTIN 300 MG PO CAPS
300.0000 mg | ORAL_CAPSULE | Freq: Three times a day (TID) | ORAL | Status: DC
  Administered 2014-09-12 – 2014-09-14 (×4): 300 mg via ORAL
  Filled 2014-09-12 (×9): qty 1

## 2014-09-12 MED ORDER — HYDROMORPHONE HCL 1 MG/ML IJ SOLN
0.5000 mg | INTRAMUSCULAR | Status: DC | PRN
  Administered 2014-09-12 (×2): 1 mg via INTRAVENOUS
  Administered 2014-09-13 (×2): 0.5 mg via INTRAVENOUS
  Filled 2014-09-12 (×4): qty 1

## 2014-09-12 MED ORDER — SUCCINYLCHOLINE CHLORIDE 20 MG/ML IJ SOLN
INTRAMUSCULAR | Status: DC | PRN
  Administered 2014-09-12: 100 mg via INTRAVENOUS
  Administered 2014-09-12: 60 mg via INTRAVENOUS

## 2014-09-12 MED ORDER — FENTANYL CITRATE 0.05 MG/ML IJ SOLN
INTRAMUSCULAR | Status: DC | PRN
  Administered 2014-09-12 (×2): 50 ug via INTRAVENOUS
  Administered 2014-09-12: 100 ug via INTRAVENOUS
  Administered 2014-09-12 (×3): 50 ug via INTRAVENOUS

## 2014-09-12 MED ORDER — PANTOPRAZOLE SODIUM 40 MG IV SOLR
40.0000 mg | INTRAVENOUS | Status: DC
  Administered 2014-09-13: 40 mg via INTRAVENOUS
  Filled 2014-09-12 (×2): qty 40

## 2014-09-12 MED ORDER — VANCOMYCIN HCL 1000 MG IV SOLR
INTRAVENOUS | Status: DC | PRN
  Administered 2014-09-12: 1000 mg via TOPICAL

## 2014-09-12 MED ORDER — SODIUM CHLORIDE 0.9 % IR SOLN
Status: DC | PRN
  Administered 2014-09-12: 18:00:00

## 2014-09-12 SURGICAL SUPPLY — 58 items
BAG DECANTER FOR FLEXI CONT (MISCELLANEOUS) ×3 IMPLANT
BENZOIN TINCTURE PRP APPL 2/3 (GAUZE/BANDAGES/DRESSINGS) ×3 IMPLANT
BRUSH SCRUB EZ PLAIN DRY (MISCELLANEOUS) ×3 IMPLANT
BUR MATCHSTICK NEURO 3.0 LAGG (BURR) ×3 IMPLANT
CAGE PEEK 6X14X11 (Cage) ×6 IMPLANT
CAGE SPNL 11X14X6XRADOPQ (Cage) ×2 IMPLANT
CANISTER SUCT 3000ML (MISCELLANEOUS) ×3 IMPLANT
CLOSURE WOUND 1/2 X4 (GAUZE/BANDAGES/DRESSINGS) ×1
CLOSURE WOUND 1/4X4 (GAUZE/BANDAGES/DRESSINGS) ×1
CONT SPEC 4OZ CLIKSEAL STRL BL (MISCELLANEOUS) ×3 IMPLANT
DRAPE C-ARM 42X72 X-RAY (DRAPES) ×6 IMPLANT
DRAPE LAPAROTOMY 100X72 PEDS (DRAPES) ×3 IMPLANT
DRAPE MICROSCOPE LEICA (MISCELLANEOUS) ×3 IMPLANT
DRAPE POUCH INSTRU U-SHP 10X18 (DRAPES) ×3 IMPLANT
DRILL BIT (BIT) ×3 IMPLANT
DURAPREP 6ML APPLICATOR 50/CS (WOUND CARE) ×3 IMPLANT
ELECT COATED BLADE 2.86 ST (ELECTRODE) ×3 IMPLANT
ELECT REM PT RETURN 9FT ADLT (ELECTROSURGICAL) ×3
ELECTRODE REM PT RTRN 9FT ADLT (ELECTROSURGICAL) ×1 IMPLANT
GAUZE SPONGE 4X4 12PLY STRL (GAUZE/BANDAGES/DRESSINGS) ×3 IMPLANT
GAUZE SPONGE 4X4 16PLY XRAY LF (GAUZE/BANDAGES/DRESSINGS) IMPLANT
GLOVE BIOGEL PI IND STRL 7.0 (GLOVE) ×1 IMPLANT
GLOVE BIOGEL PI INDICATOR 7.0 (GLOVE) ×2
GLOVE ECLIPSE 6.5 STRL STRAW (GLOVE) ×3 IMPLANT
GLOVE ECLIPSE 8.0 STRL XLNG CF (GLOVE) ×3 IMPLANT
GLOVE ECLIPSE 9.0 STRL (GLOVE) ×3 IMPLANT
GLOVE SS BIOGEL STRL SZ 6.5 (GLOVE) ×1 IMPLANT
GLOVE SUPERSENSE BIOGEL SZ 6.5 (GLOVE) ×2
GOWN STRL REUS W/ TWL LRG LVL3 (GOWN DISPOSABLE) ×1 IMPLANT
GOWN STRL REUS W/ TWL XL LVL3 (GOWN DISPOSABLE) ×1 IMPLANT
GOWN STRL REUS W/TWL 2XL LVL3 (GOWN DISPOSABLE) IMPLANT
GOWN STRL REUS W/TWL LRG LVL3 (GOWN DISPOSABLE) ×2
GOWN STRL REUS W/TWL XL LVL3 (GOWN DISPOSABLE) ×2
HALTER HD/CHIN CERV TRACTION D (MISCELLANEOUS) ×3 IMPLANT
KIT BASIN OR (CUSTOM PROCEDURE TRAY) ×3 IMPLANT
KIT ROOM TURNOVER OR (KITS) ×3 IMPLANT
NEEDLE SPNL 20GX3.5 QUINCKE YW (NEEDLE) ×3 IMPLANT
NS IRRIG 1000ML POUR BTL (IV SOLUTION) ×3 IMPLANT
PACK LAMINECTOMY NEURO (CUSTOM PROCEDURE TRAY) ×3 IMPLANT
PAD ARMBOARD 7.5X6 YLW CONV (MISCELLANEOUS) ×9 IMPLANT
PLATE ANT CERVICAL 52.5 (Plate) ×3 IMPLANT
RUBBERBAND STERILE (MISCELLANEOUS) ×6 IMPLANT
SCREW ST FIX 4 ATL 3120213 (Screw) ×24 IMPLANT
SPACER SPNL 11X14X6XPEEK CVD (Cage) ×1 IMPLANT
SPCR SPNL 11X14X6XPEEK CVD (Cage) ×1 IMPLANT
SPONGE INTESTINAL PEANUT (DISPOSABLE) ×3 IMPLANT
SPONGE SURGIFOAM ABS GEL 100 (HEMOSTASIS) ×3 IMPLANT
STRIP CLOSURE SKIN 1/2X4 (GAUZE/BANDAGES/DRESSINGS) ×2 IMPLANT
STRIP CLOSURE SKIN 1/4X4 (GAUZE/BANDAGES/DRESSINGS) ×2 IMPLANT
SUT PDS AB 5-0 P3 18 (SUTURE) ×3 IMPLANT
SUT VIC AB 3-0 SH 8-18 (SUTURE) ×3 IMPLANT
SYR 20ML ECCENTRIC (SYRINGE) ×3 IMPLANT
TAPE CLOTH 4X10 WHT NS (GAUZE/BANDAGES/DRESSINGS) ×3 IMPLANT
TAPE CLOTH SURG 4X10 WHT LF (GAUZE/BANDAGES/DRESSINGS) ×3 IMPLANT
TOWEL OR 17X24 6PK STRL BLUE (TOWEL DISPOSABLE) ×3 IMPLANT
TOWEL OR 17X26 10 PK STRL BLUE (TOWEL DISPOSABLE) ×3 IMPLANT
TRAP SPECIMEN MUCOUS 40CC (MISCELLANEOUS) ×3 IMPLANT
WATER STERILE IRR 1000ML POUR (IV SOLUTION) ×3 IMPLANT

## 2014-09-12 SURGICAL SUPPLY — 48 items
BAG DECANTER FOR FLEXI CONT (MISCELLANEOUS) ×3 IMPLANT
BENZOIN TINCTURE PRP APPL 2/3 (GAUZE/BANDAGES/DRESSINGS) ×3 IMPLANT
BRUSH SCRUB EZ PLAIN DRY (MISCELLANEOUS) ×3 IMPLANT
BUR MATCHSTICK NEURO 3.0 LAGG (BURR) IMPLANT
CANISTER SUCT 3000ML (MISCELLANEOUS) ×3 IMPLANT
CLOSURE WOUND 1/2 X4 (GAUZE/BANDAGES/DRESSINGS) ×1
CONT SPEC 4OZ CLIKSEAL STRL BL (MISCELLANEOUS) ×3 IMPLANT
DRAPE C-ARM 42X72 X-RAY (DRAPES) IMPLANT
DRAPE LAPAROTOMY 100X72 PEDS (DRAPES) ×3 IMPLANT
DRAPE MICROSCOPE LEICA (MISCELLANEOUS) IMPLANT
DRAPE POUCH INSTRU U-SHP 10X18 (DRAPES) ×3 IMPLANT
DURAPREP 6ML APPLICATOR 50/CS (WOUND CARE) ×6 IMPLANT
ELECT COATED BLADE 2.86 ST (ELECTRODE) ×3 IMPLANT
ELECT REM PT RETURN 9FT ADLT (ELECTROSURGICAL) ×3
ELECTRODE REM PT RTRN 9FT ADLT (ELECTROSURGICAL) ×1 IMPLANT
GAUZE SPONGE 4X4 12PLY STRL (GAUZE/BANDAGES/DRESSINGS) ×3 IMPLANT
GAUZE SPONGE 4X4 16PLY XRAY LF (GAUZE/BANDAGES/DRESSINGS) ×3 IMPLANT
GLOVE ECLIPSE 9.0 STRL (GLOVE) ×9 IMPLANT
GLOVE EXAM NITRILE LRG STRL (GLOVE) IMPLANT
GLOVE EXAM NITRILE MD LF STRL (GLOVE) IMPLANT
GLOVE EXAM NITRILE XL STR (GLOVE) IMPLANT
GLOVE EXAM NITRILE XS STR PU (GLOVE) IMPLANT
GOWN STRL REUS W/ TWL LRG LVL3 (GOWN DISPOSABLE) IMPLANT
GOWN STRL REUS W/ TWL XL LVL3 (GOWN DISPOSABLE) ×2 IMPLANT
GOWN STRL REUS W/TWL 2XL LVL3 (GOWN DISPOSABLE) IMPLANT
GOWN STRL REUS W/TWL LRG LVL3 (GOWN DISPOSABLE)
GOWN STRL REUS W/TWL XL LVL3 (GOWN DISPOSABLE) ×4
HALTER HD/CHIN CERV TRACTION D (MISCELLANEOUS) IMPLANT
KIT BASIN OR (CUSTOM PROCEDURE TRAY) ×3 IMPLANT
KIT ROOM TURNOVER OR (KITS) ×3 IMPLANT
NEEDLE SPNL 20GX3.5 QUINCKE YW (NEEDLE) IMPLANT
NS IRRIG 1000ML POUR BTL (IV SOLUTION) ×3 IMPLANT
PACK LAMINECTOMY NEURO (CUSTOM PROCEDURE TRAY) ×3 IMPLANT
PAD ARMBOARD 7.5X6 YLW CONV (MISCELLANEOUS) ×9 IMPLANT
RUBBERBAND STERILE (MISCELLANEOUS) IMPLANT
SPONGE INTESTINAL PEANUT (DISPOSABLE) IMPLANT
SPONGE LAP 4X18 X RAY DECT (DISPOSABLE) ×3 IMPLANT
SPONGE SURGIFOAM ABS GEL SZ50 (HEMOSTASIS) ×3 IMPLANT
STRIP CLOSURE SKIN 1/2X4 (GAUZE/BANDAGES/DRESSINGS) ×2 IMPLANT
SUT PDS AB 5-0 P3 18 (SUTURE) ×3 IMPLANT
SUT VIC AB 3-0 SH 8-18 (SUTURE) ×3 IMPLANT
SYR 20ML ECCENTRIC (SYRINGE) IMPLANT
TAPE CLOTH 4X10 WHT NS (GAUZE/BANDAGES/DRESSINGS) IMPLANT
TAPE PAPER 3X10 WHT MICROPORE (GAUZE/BANDAGES/DRESSINGS) ×3 IMPLANT
TOWEL OR 17X24 6PK STRL BLUE (TOWEL DISPOSABLE) ×3 IMPLANT
TOWEL OR 17X26 10 PK STRL BLUE (TOWEL DISPOSABLE) ×3 IMPLANT
TRAP SPECIMEN MUCOUS 40CC (MISCELLANEOUS) IMPLANT
WATER STERILE IRR 1000ML POUR (IV SOLUTION) ×3 IMPLANT

## 2014-09-12 NOTE — Significant Event (Signed)
Rapid Response Event Note  Overview:  RRT called to assist with pt post-op ACDF with neck swelling and raspy voice.   Time Called: 1527 Arrival Time: 1657 Event Type: Respiratory  Initial Focused Assessment:  Pt alert and oriented, calm, mildly anxious, no resp distress noted, LS clear, sats 100%.  Swelling noted and firmness noted around right sided neck surgical incision, no active bleeding seen.  Upper airway with some stridor, pt able to speak, voice raspy and with difficulty swallowing.  Skin warm and dry.  BP 141/99, HR 85, O2 100% on RA.  Resps 16.  Dr Trenton Gammon contacted by Caryl Pina, RN   Interventions:  Pt placed on 100% O2 via NRM.  20g PIV started in R AC.  VS obtained and monitored with [place].  Dr Trenton Gammon to beside, examined pt,family and pt updated on plan to return to OR by MD.  Continued to monitor pt, VS remained stable, no increased distress noted.  Pt transferred to neuro OR with RN and RT.  Bedside report given to Judson Roch, CRNA.   Event Summary: Name of Physician Notified: Dr Trenton Gammon  at  (prior to RRT)    at    Outcome: Other (Comment) (returned to [place])  Event End Time: [place], Lisa Odom

## 2014-09-12 NOTE — Anesthesia Procedure Notes (Signed)
Procedure Name: Intubation Date/Time: 09/12/2014 4:42 PM Performed by: Trixie Deis A Pre-anesthesia Checklist: Patient identified, Timeout performed, Emergency Drugs available, Suction available and Patient being monitored Patient Re-evaluated:Patient Re-evaluated prior to inductionOxygen Delivery Method: Circle system utilized Intubation Type: IV induction and Rapid sequence Ventilation: Mask ventilation with difficulty, Oral airway inserted - appropriate to patient size and Two handed mask ventilation required Tube type: Parker flex tip Tube size: 6.5 mm Number of attempts: 3 (All attempts performed by Dr. Orene Desanctis- difficult airway) Airway Equipment and Method: Video-laryngoscopy (small adult handle used ) Placement Confirmation: ETT inserted through vocal cords under direct vision,  breath sounds checked- equal and bilateral and positive ETCO2 Secured at: 21 cm Tube secured with: Tape Dental Injury: Teeth and Oropharynx as per pre-operative assessment  Difficulty Due To: Difficulty was anticipated, Difficult Airway-  due to edematous airway and Difficult Airway- due to reduced neck mobility Comments: Cervical hematoma present increasing difficulty of intubation

## 2014-09-12 NOTE — Anesthesia Preprocedure Evaluation (Addendum)
Anesthesia Evaluation  Patient identified by MRN, date of birth, ID band Patient awake    Reviewed: Allergy & Precautions, H&P , Patient's Chart, lab work & pertinent test results  History of Anesthesia Complications (+) Family history of anesthesia reaction  Airway Mallampati: II  TM Distance: >3 FB Neck ROM: Full    Dental  (+) Teeth Intact, Dental Advisory Given   Pulmonary  breath sounds clear to auscultation        Cardiovascular hypertension, Pt. on medications Rhythm:Regular Rate:Normal     Neuro/Psych  Headaches, Anxiety Depression  Neuromuscular disease    GI/Hepatic hiatal hernia, PUD, GERD-  Medicated and Controlled,  Endo/Other  diabetes, Well Controlled, Type 2  Renal/GU      Musculoskeletal Pain syndrome   Abdominal   Peds  Hematology   Anesthesia Other Findings   Reproductive/Obstetrics                          Anesthesia Physical Anesthesia Plan  ASA: III  Anesthesia Plan: General   Post-op Pain Management:    Induction: Intravenous  Airway Management Planned: Oral ETT  Additional Equipment:   Intra-op Plan:   Post-operative Plan: Extubation in OR  Informed Consent: I have reviewed the patients History and Physical, chart, labs and discussed the procedure including the risks, benefits and alternatives for the proposed anesthesia with the patient or authorized representative who has indicated his/her understanding and acceptance.   Dental advisory given  Plan Discussed with: CRNA, Surgeon and Anesthesiologist  Anesthesia Plan Comments:        Anesthesia Quick Evaluation

## 2014-09-12 NOTE — Consult Note (Addendum)
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  She had anterior cervical discectomy.  Post-op complicated by neck swelling from hematoma.  She had re-exploration, and remained on vent post-op.  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  She had anterior cervical discectomy.  Post-op complicated by neck swelling and raspy voice from hematoma.  She had re-exploration, and remained on vent post-op.  PAST MEDICAL HISTORY :   has a past medical history of Diverticulosis; Hiatal hernia; Gastritis; Migraine; Depression; Anxiety; GERD (gastroesophageal reflux disease); Contact lens/glasses fitting; Diabetes mellitus without complication; Family history of adverse reaction to anesthesia; Panic attacks; and Hemorrhoid.  has past surgical history that includes Shoulder arthroscopy with rotator cuff repair (2010); Carpal tunnel release (2002); Tubal ligation; Colonoscopy; Upper gi endoscopy; and Trigger finger release (Right, 03/23/2013). Prior to Admission medications   Medication Sig Start Date End Date Taking? Authorizing Provider  ALPRAZolam Duanne Moron) 1 MG tablet Take 1 mg by mouth 2 (two) times daily as needed for anxiety.   Yes Historical  Provider, MD  amphetamine-dextroamphetamine (ADDERALL) 20 MG tablet Take 20 mg by mouth daily. Take 30 mg in the morning and 20 mg at lunch at time   Yes Historical Provider, MD  amphetamine-dextroamphetamine (ADDERALL) 30 MG tablet Take 30 mg by mouth every morning. Take 30 mg in the morning and 20 mg at lunch at time   Yes Historical Provider, MD  aspirin 81 MG chewable tablet Chew by mouth daily.   Yes Historical Provider, MD  aspirin-acetaminophen-caffeine (EXCEDRIN MIGRAINE) [phone] MG per tablet Take 2 tablets by mouth every 6 (six) hours as needed for migraine.    Yes Historical Provider, MD  b complex vitamins capsule Take 1 capsule by mouth every other day.    Yes Historical Provider, MD  Biotin 5 MG TABS Take 1 tablet by mouth every other day.    Yes Historical Provider, MD  DULoxetine (CYMBALTA) 60 MG capsule Take 120 mg by mouth daily.   Yes Historical Provider, MD  gabapentin (NEURONTIN) 300 MG capsule Take one tablet by mouth in the morning and at lunch.  Then two by mouth at bedtime 07/28/14  Yes Max T Hyatt, DPM  glucose blood (ONE TOUCH ULTRA TEST) test strip Use to check blood sugar daily.  Dx: 250.60 01/09/14  Yes [name], MD  HYDROcodone-acetaminophen (NORCO/VICODIN) 5-325 MG per tablet Take 1 tablet by mouth daily as needed for moderate pain. 08/17/14  Yes [name], MD  meloxicam (MOBIC) 15 MG tablet Take 1 tablet by mouth  daily Patient taking differently: Take 1 tablet by mouth  daily as needed 07/28/14  Yes Amy E Bedsole, MD  methocarbamol (ROBAXIN) 500 MG tablet Take 500 mg by mouth daily as needed  for muscle spasms.  07/13/14  Yes Historical Provider, MD  Multiple Vitamin (MULTIVITAMIN) tablet Take 1 tablet by mouth daily.   Yes Historical Provider, MD  ondansetron (ZOFRAN) 8 MG tablet Take 1 tablet (8 mg total) by mouth every 8 (eight) hours as needed for nausea or vomiting. 03/15/14  Yes Owens Loffler, MD  [place] LANCETS 08X [place]  01/02/14  Yes  Historical Provider, MD  pantoprazole (PROTONIX) 40 MG tablet Take 1 tablet by mouth  daily 06/19/14  Yes Amy E Bedsole, MD  traMADol (ULTRAM) 50 MG tablet Take 1 tablet (50 mg total) by mouth every 8 (eight) hours as needed for severe pain. 08/01/14  Yes [name], MD  Vitamin D, Ergocalciferol, (DRISDOL) 50000 UNITS CAPS capsule Take 1 capsule (50,000 Units total) by mouth every 7 (seven) days. 08/03/14  Yes [name], MD   Allergies  Allergen Reactions  . Sulfonamide Derivatives     REACTION: Rash  . Hydrochlorothiazide Rash    FAMILY HISTORY:  has no family status information on file.  SOCIAL HISTORY:  reports that she has never smoked. She has never used smokeless tobacco. She reports that she does not drink alcohol or use illicit drugs.  REVIEW OF SYSTEMS:   Unable to obtain  SUBJECTIVE: vented sedated  VITAL SIGNS: Temp:  [97.2 F (36.2 C)-97.8 F (36.6 C)] 97.8 F (36.6 C) (12/15 1214) Pulse Rate:  [61-94] 94 (12/15 1534) Resp:  [8-18] 16 (12/15 1534) BP: (123-163)/(70-88) 123/81 mmHg (12/15 1534) SpO2:  [95 %-100 %] 95 % (12/15 1534) Weight:  [68.04 kg (150 lb)] 68.04 kg (150 lb) (12/15 0617) HEMODYNAMICS:   VENTILATOR SETTINGS:   INTAKE / OUTPUT:  Intake/Output Summary (Last 24 hours) at 09/12/14 1727 Last data filed at 09/12/14 1720  Gross per 24 hour  Intake   2090 ml  Output    400 ml  Net   1690 ml    PHYSICAL EXAMINATION: General:  Deep sedated Neuro:  Per 2 mm, sluggish, rass -5 HEENT: wound clean, drain with blood, ett small Cardiovascular:  s1 s2 RRR int brady 59  Lungs:  CTA Abdomen:  Soft BS hypo, no r/g Musculoskeletal:  No edema Skin:  No rash  LABS:  CBC  Recent Labs Lab 09/11/14 1529  WBC 7.6  HGB 12.9  HCT 40.1  PLT 406*   Coag's No results for input(s): APTT, INR in the last 168 hours. BMET  Recent Labs Lab 09/11/14 1529  NA 138  K 4.1  CL 100  CO2 24  BUN 13  CREATININE 0.67  GLUCOSE 86    Electrolytes  Recent Labs Lab 09/11/14 1529  CALCIUM 9.5   Sepsis Markers No results for input(s): LATICACIDVEN, PROCALCITON, O2SATVEN in the last 168 hours. ABG No results for input(s): PHART, PCO2ART, PO2ART in the last 168 hours. Liver Enzymes No results for input(s): AST, ALT, ALKPHOS, BILITOT, ALBUMIN in the last 168 hours. Cardiac Enzymes No results for input(s): TROPONINI, PROBNP in the last 168 hours. Glucose  Recent Labs Lab 09/12/14 0626 09/12/14 1154  GLUCAP 118* 128*    Imaging No results found.   ASSESSMENT / PLAN: NEUROLOGIC A:   C spine cord compression from spinal stenosis s/p anterior discectomy complicated by hematoma. Hx of Depression, Anxiety, Headaches Vent dyschrony with airway edema , high risk if self extubate P:   RASS goal: -3 Post-op care per neurosurgery Hold outpt xanax (which is PRN), adderall, cymbalta, neurontin, tramadol, norco, robaxin Add propofol Prn  dilauded per NS  PULMONARY ETT 12/15 >> A: Stridor 2nd to hematoma. Post-op respirator dependence. P:   Vent support until neck wound stable F/u CXR, ABG 8 cc/kg, keep plat less 30 Plan leak test in am, with high pretest probability if no leak- would hold extubation then  Low threshold additional decadron Would consider 10 additional decadron 6-10 hr prior to extubation  CARDIOVASCULAR A:  No acute issues. P:  Monitor hemodynamics with start propofol  RENAL A:   Assess electrolytes P:   Monitor renal fx, urine outpt, electrolytes bmet assessment post anesthesia now  GASTROINTESTINAL A:   Hx of GERD, hiatal hernia. P:   NPO Tube feeds if unable to extubate in 24 hrs Protonix for SUP to IV  HEMATOLOGIC A:   Neck hematoma. P:  F/u CBC, pt, ptt Follow drain output SCD's  INFECTIOUS A:   Post-op neurosurgery prophylaxis. P:   Day 1 ancef, vancomycin started 12/15  ENDOCRINE A:   DM type II. P:   SSI  FAMILY  - Updates: sisters x 1 by me  12/15  - Inter-disciplinary family meet or Palliative Care meeting due by:  12/123   TODAY'S SUMMARY:  S/p anterior cervical discectomy complicated by hematoma with stridor.  Had redo with evacuation of hematoma.  To remain on vent for airway protection until more stable. Need deeper rass tonight, add propofol, dilauded forn ow, check lytes now, add ssi, change PPI to iv, leak important in am    Ccm time 30 min  D/w NS  Lavon Paganini. Titus Mould, MD, [place] Pgr: [facility]  Pulmonary and [place] Pager: [phone]  09/12/2014, 5:27 PM

## 2014-09-12 NOTE — Anesthesia Procedure Notes (Signed)
Procedure Name: Intubation Date/Time: 09/12/2014 7:40 AM Performed by: Carney Living Pre-anesthesia Checklist: Patient identified, Emergency Drugs available, Suction available, Patient being monitored and Timeout performed Patient Re-evaluated:Patient Re-evaluated prior to inductionOxygen Delivery Method: Circle system utilized Preoxygenation: Pre-oxygenation with 100% oxygen Intubation Type: IV induction Ventilation: Mask ventilation without difficulty and Oral airway inserted - appropriate to patient size Laryngoscope Size: Mac and 4 Grade View: Grade II Tube type: Oral Tube size: 7.0 mm Number of attempts: 2 Airway Equipment and Method: Stylet Placement Confirmation: ETT inserted through vocal cords under direct vision,  positive ETCO2 and breath sounds checked- equal and bilateral Secured at: 20 cm Tube secured with: Tape Dental Injury: Teeth and Oropharynx as per pre-operative assessment

## 2014-09-12 NOTE — Progress Notes (Signed)
PHARMACIST - PHYSICIAN ORDER COMMUNICATION  CONCERNING: P&T Medication Policy on Herbal Medications  DESCRIPTION:  This patient's order for:  Biotin  has been noted.  This product(s) is classified as an "herbal" or natural product. Due to a lack of definitive safety studies or FDA approval, nonstandard manufacturing practices, plus the potential risk of unknown drug-drug interactions while on inpatient medications, the Pharmacy and Therapeutics Committee does not permit the use of "herbal" or natural products of this type within [facility].   ACTION TAKEN: The pharmacy department is unable to verify this order at this time. Please reevaluate patient's clinical condition at discharge and address if the herbal or natural product(s) should be resumed at that time.   Kelvin Cellar, RPh Pager: [phone] 09/12/2014 12:46 PM

## 2014-09-12 NOTE — Op Note (Signed)
Date of procedure: 09/12/2014  Date of dictation: Same  Service: Neurosurgery  Preoperative diagnosis: Cervical stenosis with myelopathy  Postoperative diagnosis: Same  Procedure Name: C4-5, C5-6, C6-7 anterior cervical discectomy with interbody fusion utilizing interbody peek cage and locally harvested autograft and anterior instrumentation.  Surgeon:Verna Hamon A.Shiloh Swopes, M.D.  Asst. Surgeon: Hal Neer  Anesthesia: General  Indication: 57 year old female with neck pain and progressive bilateral upper extremity symptoms. Workup demonstrates evidence of critical spinal stenosis worse at C4-5 but also present at C5-6 and C6-7. Patient presents now for three-level anterior cervical decompression and fusion.  Operative note: After induction of anesthesia, patient positioned supine with his neck slightlyExtended and held place of halter traction. Patient's anterior cervical region prepped and draped sterilely. Incision made. Dissection performed on the right side. Retractor placed. X-ray taken. Levels confirmed. Discectomies performed at C4-5, C5-6 and C6-7 using pituitary rongeurs forward and backward L Cronk curettes, Kerrison rongeurs and the high-speed drill. All elements the disc removed down to the level of the posterior annulus. Microscope brought into the field use at the remainder of the discectomy. Remaining aspects of annulus and osteophytes using the high-speed drill. Posterior longitudinal ligament was elevated and resected piecemeal fashion using Kerrison rongeurs. Underlying thecal sac was then identified. Wide central decompression then performed by undercutting the bodies of C4 and C5. Decompression then proceeded out each neural foramen. Wide anterior foraminotomies performed bilaterally. Decompression was then  performed in a similar fashion at C5-6 and C6-7. Wounds were irrigated out like solution. 6 mm peek cage was packed with morselized autograft and packed in place at all 3 levels. All  cages recessed slightly from the anterior cortical margin. Atlantis translational anterior cervical plate placed over the C4, C5, C6 and C7 levels. This is an attachment or fluoroscopic guidance using 13 mm fixed angle screws to reach it all 4 levels. All 8 screws given a final tightening found be solidly within the bone. Locking screws and gauge at all 3 levels. Final images revealed good position of the cages and instrumentation at the proper upper level with normal lamina spine. Wound is then irrigated by solution. Gelfoam placed topically for hemostasis and removed. Wound irrigated one final time and then closed in a typical fashion. Steri-Strips sterile dressing were applied. No apparent complications. Patient tolerated the procedure well and she returns to the recovery room postop.

## 2014-09-12 NOTE — Anesthesia Postprocedure Evaluation (Signed)
  Anesthesia Post-op Note  Patient: Lisa Odom  Procedure(s) Performed: Procedure(s): Evacuation of cervical hematoma (N/A)  Patient Location: ICU  Anesthesia Type:General  Level of Consciousness: sedated and unresponsive  Airway and Oxygen Therapy: Patient remains intubated and on ventilator  Post-op Pain: none  Post-op Assessment: Post-op Vital signs reviewed, Patient's Cardiovascular Status Stable and Respiratory Function Stable  Post-op Vital Signs: Reviewed  Filed Vitals:   09/12/14 1745  BP: 112/72  Pulse: 68  Temp:   Resp: 23    Complications: No apparent anesthesia complications

## 2014-09-12 NOTE — Transfer of Care (Signed)
Immediate Anesthesia Transfer of Care Note  Patient: Lisa Odom  Procedure(s) Performed: Procedure(s): Evacuation of cervical hematoma (N/A)  Patient Location: NICU  Anesthesia Type:General  Level of Consciousness: sedated and Patient remains intubated per anesthesia plan  Airway & Oxygen Therapy: Patient remains intubated per anesthesia plan and Patient placed on Ventilator (see vital sign flow sheet for setting)  Post-op Assessment: Report given to PACU RN, Post -op Vital signs reviewed and stable and Patient moving all extremities  Post vital signs: Reviewed and stable  Complications: No apparent anesthesia complications

## 2014-09-12 NOTE — Anesthesia Postprocedure Evaluation (Signed)
  Anesthesia Post-op Note  Patient: Lisa Odom  Procedure(s) Performed: Procedure(s): CERVICAL FOUR-FIVE, CERVICAL FIVE-SIX, CERVICAL SIX-SEVEN ANTERIOR CERVICAL DECOMPRESSION/DISCECTOMY FUSION (N/A)  Patient Location: PACU  Anesthesia Type:General  Level of Consciousness: awake and alert   Airway and Oxygen Therapy: Patient Spontanous Breathing  Post-op Pain: mild  Post-op Assessment: Post-op Vital signs reviewed  Post-op Vital Signs: stable  Last Vitals:  Filed Vitals:   09/12/14 1145  BP: 141/83  Pulse: 76  Temp: 36.5 C  Resp: 13    Complications: No apparent anesthesia complications

## 2014-09-12 NOTE — Brief Op Note (Signed)
09/12/2014  5:13 PM  PATIENT:  Salem Caster  58 y.o. female  PRE-OPERATIVE DIAGNOSIS:  hematoma  POST-OPERATIVE DIAGNOSIS:  * No post-op diagnosis entered *  PROCEDURE:  Procedure(s): Evacuation of cervical hematoma (N/A)  SURGEON:  Surgeon(s) and Role:    * [name], MD - Primary  PHYSICIAN ASSISTANT:   ASSISTANTS:    ANESTHESIA:   general  EBL:  Total I/O In: 2090 [P.O.:240; I.V.:1850] Out: 300 [Blood:300]  BLOOD ADMINISTERED:none  DRAINS: none and (Med) Hemovact drain(s) in the prevertebral space with  Suction Open   LOCAL MEDICATIONS USED:  NONE  SPECIMEN:  No Specimen  DISPOSITION OF SPECIMEN:  N/A  COUNTS:  YES  TOURNIQUET:  * No tourniquets in log *  DICTATION: .Dragon Dictation  PLAN OF CARE: Admit to inpatient   PATIENT DISPOSITION:  PACU - hemodynamically stable.   Delay start of Pharmacological VTE agent (>24hrs) due to surgical blood loss or risk of bleeding: yes

## 2014-09-12 NOTE — Op Note (Signed)
Date of procedure: 09/12/2014  Date of dictation: Same  Service: Neurosurgery  Preoperative diagnosis: Postoperative anterior cervical hematoma  Postoperative diagnosis: Same  Procedure Name: Reexploration of anterior cervical fusion with evacuation of hematoma  Surgeon:Virdie Penning A.Primus Gritton, M.D.  Asst. Surgeon: None  Anesthesia: General  Indication: 58 year old female status post uncomplicated 3 ovoid anterior cervical decompression and fusion developed postoperative wound hematoma with increasing neck pain and some respiratory distress. Taken emergently to the operating room for evacuation of hematoma.  Operative note: After induction of anesthesia, patient's anterior cervical regions prepped and draped. During attempts at intubation to distorted for normal passage. Patient's wound was reopened and hematoma was evacuated. Upon decompressing the airway anesthesia was unable to cannulate the patient's trachea and provide endotracheal anesthesia. Wound was reprepped and draped. The wound was further explored. Hematoma was further evacuated. Small amount of arterial bleeding coming from a small transverse cervical artery. This was coagulated using bipolar cautery. There is no evidence of any further bleeding. Wound was serially with MI solution. There is no evidence of any residual hematoma and was any evidence of active bleeding. A medium Hemovac drain was left in the prevertebral space. The wound is area with time. Vancomycin powder was placed in the deep wound space and the wounds and closed in a typical fashion. Steri-Strips are applied. Patient is planned to be Intubated overnight for airway protection. She will be observed in the ICU.

## 2014-09-12 NOTE — Anesthesia Preprocedure Evaluation (Addendum)
Anesthesia Evaluation  Patient identified by MRN, date of birth, ID band Patient awake    Reviewed: Allergy & Precautions, H&P , Patient's Chart, lab work & pertinent test results  Airway Mallampati: II  TM Distance: >3 FB Neck ROM: Full    Dental  (+) Teeth Intact, Dental Advisory Given   Pulmonary  breath sounds clear to auscultation        Cardiovascular hypertension, Pt. on medications Rhythm:Regular Rate:Normal     Neuro/Psych  Headaches,  Neuromuscular disease    GI/Hepatic hiatal hernia, PUD, GERD-  Medicated and Controlled,  Endo/Other  diabetes, Well Controlled, Type 2  Renal/GU      Musculoskeletal   Abdominal   Peds  Hematology   Anesthesia Other Findings   Reproductive/Obstetrics                            Anesthesia Physical Anesthesia Plan  ASA: III and emergent  Anesthesia Plan: General ETT   Post-op Pain Management:    Induction: Intravenous and Rapid sequence  Airway Management Planned: Oral ETT and Video Laryngoscope Planned  Additional Equipment:   Intra-op Plan:   Post-operative Plan: Possible Post-op intubation/ventilation and Extubation in OR  Informed Consent: I have reviewed the patients History and Physical, chart, labs and discussed the procedure including the risks, benefits and alternatives for the proposed anesthesia with the patient or authorized representative who has indicated his/her understanding and acceptance.     Plan Discussed with: CRNA and Surgeon  Anesthesia Plan Comments:        Anesthesia Quick Evaluation

## 2014-09-12 NOTE — Progress Notes (Signed)
Pt started c/o her throat being very sore and is having difficulty swallowing. Assessed Pt's neck and incision and noted some edema. Pt is alert and oriented at this time and is able to communicate with me. I called Dr. Annette Stable and notified him of Pt's status change. Dr. Annette Stable on the way to see Pt. Rapid Response called to assist with Pt. Pt was placed on NRB mask for comfort and heart monitor. Dr. Annette Stable saw Pt and decided to take Pt to OR for evacuation of hematoma. Consent was signed by Pt. Pt was transferred to OR. Holli Humbles, RN

## 2014-09-12 NOTE — Progress Notes (Signed)
Utilization review completed.  

## 2014-09-12 NOTE — H&P (Signed)
Lisa Odom is an 58 y.o. female.   Chief Complaint: Neck pain HPI: 58 year old female with progressive neck pain with bilateral upper extremity numbness pain and weakness. Workup demonstrates evidence of severe multilevel disc degeneration within her cervical spine with disc protrusions and severe spinal stenosis with cord compression at C4-5 and to lesser degree at C5-6 and C6-7. Patient presents now for three-level anterior cervical decompression and fusion hopes of improving her symptoms.  Past Medical History  Diagnosis Date  . Diverticulosis   . Hiatal hernia   . Gastritis   . Migraine   . Depression   . Anxiety   . GERD (gastroesophageal reflux disease)   . Contact lens/glasses fitting     wears contacts or glasses  . Diabetes mellitus without complication   . Family history of adverse reaction to anesthesia     pts sister has severe N/V  . Panic attacks   . Hemorrhoid     Past Surgical History  Procedure Laterality Date  . Shoulder arthroscopy with rotator cuff repair  2010    right  . Carpal tunnel release  2002    right  . Tubal ligation    . Colonoscopy    . Upper gi endoscopy    . Trigger finger release Right 03/23/2013    Procedure: RELEASE TRIGGER FINGER/A-1 PULLEY RIGHT RING FINGER;  Surgeon: [name], MD;  Location: [place];  Service: Orthopedics;  Laterality: Right;    Family History  Problem Relation Age of Onset  . Heart disease Brother   . Ovarian cancer Cousin   . Breast cancer Maternal Grandmother     grandmother  . Diabetes Maternal Aunt     aunt  . Diabetes Sister   . Alcohol abuse Maternal Uncle     uncle  . Diabetes Paternal Aunt   . Alcohol abuse Paternal Uncle    Social History:  reports that she has never smoked. She has never used smokeless tobacco. She reports that she does not drink alcohol or use illicit drugs.  Allergies:  Allergies  Allergen Reactions  . Sulfonamide Derivatives     REACTION: Rash  .  Hydrochlorothiazide Rash    Medications Prior to Admission  Medication Sig Dispense Refill  . ALPRAZolam (XANAX) 1 MG tablet Take 1 mg by mouth 2 (two) times daily as needed for anxiety.    Marland Kitchen amphetamine-dextroamphetamine (ADDERALL) 20 MG tablet Take 20 mg by mouth daily. Take 30 mg in the morning and 20 mg at lunch at time    . amphetamine-dextroamphetamine (ADDERALL) 30 MG tablet Take 30 mg by mouth every morning. Take 30 mg in the morning and 20 mg at lunch at time    . aspirin 81 MG chewable tablet Chew by mouth daily.    Marland Kitchen aspirin-acetaminophen-caffeine (EXCEDRIN MIGRAINE) 250-250-65 MG per tablet Take 2 tablets by mouth every 6 (six) hours as needed for migraine.     Marland Kitchen b complex vitamins capsule Take 1 capsule by mouth every other day.     . Biotin 5 MG TABS Take 1 tablet by mouth every other day.     . DULoxetine (CYMBALTA) 60 MG capsule Take 120 mg by mouth daily.    Marland Kitchen gabapentin (NEURONTIN) 300 MG capsule Take one tablet by mouth in the morning and at lunch.  Then two by mouth at bedtime 120 capsule 1  . glucose blood (ONE TOUCH ULTRA TEST) test strip Use to check blood sugar daily.  Dx: 250.60 100  each 3  . HYDROcodone-acetaminophen (NORCO/VICODIN) 5-325 MG per tablet Take 1 tablet by mouth daily as needed for moderate pain. 20 tablet 0  . meloxicam (MOBIC) 15 MG tablet Take 1 tablet by mouth  daily (Patient taking differently: Take 1 tablet by mouth  daily as needed) 90 tablet 1  . methocarbamol (ROBAXIN) 500 MG tablet Take 500 mg by mouth daily as needed for muscle spasms.     . Multiple Vitamin (MULTIVITAMIN) tablet Take 1 tablet by mouth daily.    . ondansetron (ZOFRAN) 8 MG tablet Take 1 tablet (8 mg total) by mouth every 8 (eight) hours as needed for nausea or vomiting. 20 tablet 0  . ONETOUCH DELICA LANCETS 14N MISC     . pantoprazole (PROTONIX) 40 MG tablet Take 1 tablet by mouth  daily 90 tablet 0  . traMADol (ULTRAM) 50 MG tablet Take 1 tablet (50 mg total) by mouth every 8  (eight) hours as needed for severe pain. 90 tablet 0  . Vitamin D, Ergocalciferol, (DRISDOL) 50000 UNITS CAPS capsule Take 1 capsule (50,000 Units total) by mouth every 7 (seven) days. 12 capsule 0    Results for orders placed or performed during the hospital encounter of 09/12/14 (from the past 48 hour(s))  Glucose, capillary     Status: Abnormal   Collection Time: 09/12/14  6:26 AM  Result Value Ref Range   Glucose-Capillary 118 (H) 70 - 99 mg/dL   Comment 1 Documented in Chart    Comment 2 Notify RN    No results found.  Review of Systems  Constitutional: Negative.   HENT: Negative.   Eyes: Negative.   Respiratory: Negative.   Cardiovascular: Negative.   Gastrointestinal: Negative.   Genitourinary: Negative.   Skin: Negative.   Endo/Heme/Allergies: Negative.   Psychiatric/Behavioral: Negative.     Blood pressure 144/88, pulse 74, temperature 97.2 F (36.2 C), temperature source Oral, resp. rate 18, height 5\' 2"  (1.575 m), weight 68.04 kg (150 lb), SpO2 98 %. Physical Exam  Constitutional: She is oriented to person, place, and time. She appears well-developed and well-nourished. No distress.  HENT:  Head: Normocephalic and atraumatic.  Right Ear: External ear normal.  Left Ear: External ear normal.  Mouth/Throat: Oropharynx is clear and moist.  Eyes: Conjunctivae and EOM are normal. Pupils are equal, round, and reactive to light. Right eye exhibits no discharge. Left eye exhibits no discharge. No scleral icterus.  Neck: Normal range of motion. Neck supple. No tracheal deviation present. No thyromegaly present.  Cardiovascular: Normal rate, regular rhythm, normal heart sounds and intact distal pulses.  Exam reveals no friction rub.   No murmur heard. Respiratory: Effort normal and breath sounds normal. No respiratory distress. She has no wheezes.  GI: Soft. Bowel sounds are normal. She exhibits no distension. There is no tenderness.  Musculoskeletal: Normal range of motion.  She exhibits no edema or tenderness.  Neurological: She is alert and oriented to person, place, and time. She has normal reflexes. She displays normal reflexes. No cranial nerve deficit. She exhibits normal muscle tone. Coordination normal.  Decreased strength left upper extremity for over 5 in deltoids biceps grip and intrinsics decreased strength right upper extremity 4+ over 5. Lower extremity strength normal with some increased tone. Reflexes hyperactive. Hoffmann's response right hand.  Skin: Skin is warm and dry. No rash noted. She is not diaphoretic. No erythema. No pallor.  Psychiatric: She has a normal mood and affect. Her behavior is normal. Judgment and thought content  normal.     Assessment/Plan C4-5, C5-6, C6-7 stenosis with myelopathy. Plan C4-5, C5-6, C6-7 ACDF with interbody cages, locally harvested autograft, and anterior plate instrumentation. Risks and benefits of been explained. Patient wishes to proceed.  Hokulani Rogel A 09/12/2014, 7:27 AM

## 2014-09-12 NOTE — Progress Notes (Signed)
RT called to go with Rapid Response to assess patient. When I arrived, patient was having a hard time swallowing, but still breathing adequately. SAT 95% on RA. Placed on NRB to help with SOB. BBS clear and equal, no stridor noted. Patient is to be taken back to OR for removal of clot, but airway is stable at this time. Will continue to monitor.

## 2014-09-12 NOTE — Transfer of Care (Signed)
Immediate Anesthesia Transfer of Care Note  Patient: Lisa Odom  Procedure(s) Performed: Procedure(s): CERVICAL FOUR-FIVE, CERVICAL FIVE-SIX, CERVICAL SIX-SEVEN ANTERIOR CERVICAL DECOMPRESSION/DISCECTOMY FUSION (N/A)  Patient Location: PACU  Anesthesia Type:General  Level of Consciousness: awake, oriented and patient cooperative  Airway & Oxygen Therapy: Patient Spontanous Breathing and Patient connected to nasal cannula oxygen  Post-op Assessment: Report given to PACU RN, Post -op Vital signs reviewed and stable and Patient moving all extremities X 4  Post vital signs: Reviewed and stable  Complications: No apparent anesthesia complications

## 2014-09-12 NOTE — Progress Notes (Signed)
Pt received from neuro OR. Intubated. Able to open eyes, nod head and move all extremities. OGT placed to decompress stomach which is distended. Foley placed. Pain med given and propofol infusion started per order. Vitals stable, family updated.

## 2014-09-12 NOTE — Brief Op Note (Signed)
09/12/2014  10:04 AM  PATIENT:  Salem Caster  58 y.o. female  PRE-OPERATIVE DIAGNOSIS:  stenosis  POST-OPERATIVE DIAGNOSIS:  stenosis  PROCEDURE:  Procedure(s): CERVICAL FOUR-FIVE, CERVICAL FIVE-SIX, CERVICAL SIX-SEVEN ANTERIOR CERVICAL DECOMPRESSION/DISCECTOMY FUSION (N/A)  SURGEON:  Surgeon(s) and Role:    * [name], MD - Primary  PHYSICIAN ASSISTANT:   ASSISTANTS: Kritzer   ANESTHESIA:   general  EBL:  Total I/O In: 1000 [I.V.:1000] Out: 300 [Blood:300]  BLOOD ADMINISTERED:none  DRAINS: none   LOCAL MEDICATIONS USED:  NONE  SPECIMEN:  No Specimen  DISPOSITION OF SPECIMEN:  N/A  COUNTS:  YES  TOURNIQUET:  * No tourniquets in log *  DICTATION: .Dragon Dictation  PLAN OF CARE: Admit to inpatient   PATIENT DISPOSITION:  PACU - hemodynamically stable.   Delay start of Pharmacological VTE agent (>24hrs) due to surgical blood loss or risk of bleeding: yes

## 2014-09-13 ENCOUNTER — Inpatient Hospital Stay (HOSPITAL_COMMUNITY): Payer: 59

## 2014-09-13 ENCOUNTER — Encounter (HOSPITAL_COMMUNITY): Payer: Self-pay | Admitting: Neurosurgery

## 2014-09-13 DIAGNOSIS — J9602 Acute respiratory failure with hypercapnia: Secondary | ICD-10-CM

## 2014-09-13 LAB — COMPREHENSIVE METABOLIC PANEL
ALBUMIN: 3.1 g/dL — AB (ref 3.5–5.2)
ALK PHOS: 86 U/L (ref 39–117)
ALT: 14 U/L (ref 0–35)
ANION GAP: 10 (ref 5–15)
AST: 17 U/L (ref 0–37)
BILIRUBIN TOTAL: 0.3 mg/dL (ref 0.3–1.2)
BUN: 11 mg/dL (ref 6–23)
CHLORIDE: 101 meq/L (ref 96–112)
CO2: 27 mEq/L (ref 19–32)
CREATININE: 0.57 mg/dL (ref 0.50–1.10)
Calcium: 8.9 mg/dL (ref 8.4–10.5)
GFR calc non Af Amer: >90 mL/min (ref >90)
Glucose, Bld: 124 mg/dL — ABNORMAL HIGH (ref 70–99)
Potassium: 4.3 mEq/L (ref 3.7–5.3)
Sodium: 138 mEq/L (ref 137–147)
TOTAL PROTEIN: 6.5 g/dL (ref 6.0–8.3)

## 2014-09-13 LAB — GLUCOSE, CAPILLARY
GLUCOSE-CAPILLARY: 102 mg/dL — AB (ref 70–99)
Glucose-Capillary: 64 mg/dL — ABNORMAL LOW (ref 70–99)
Glucose-Capillary: 78 mg/dL (ref 70–99)
Glucose-Capillary: 90 mg/dL (ref 70–99)
Glucose-Capillary: 94 mg/dL (ref 70–99)
Glucose-Capillary: 97 mg/dL (ref 70–99)
Glucose-Capillary: 98 mg/dL (ref 70–99)

## 2014-09-13 MED ORDER — ALPRAZOLAM 0.5 MG PO TABS: 0.5000 mg | ORAL_TABLET | Freq: Two times a day (BID) | ORAL | Status: DC | PRN

## 2014-09-13 MED ORDER — FENTANYL CITRATE 0.05 MG/ML IJ SOLN
25.0000 ug | Freq: Once | INTRAMUSCULAR | Status: AC
  Administered 2014-09-13: 25 ug via INTRAVENOUS

## 2014-09-13 MED ORDER — INSULIN ASPART 100 UNIT/ML ~~LOC~~ SOLN: 0.0000 [IU] | Freq: Three times a day (TID) | SUBCUTANEOUS | Status: DC

## 2014-09-13 MED ORDER — FENTANYL CITRATE 0.05 MG/ML IJ SOLN
12.5000 ug | INTRAMUSCULAR | Status: DC | PRN
Start: 2014-09-13 — End: 2014-09-14
  Administered 2014-09-13 (×3): 25 ug via INTRAVENOUS
  Filled 2014-09-13 (×4): qty 2

## 2014-09-13 MED ORDER — INSULIN ASPART 100 UNIT/ML ~~LOC~~ SOLN: 0.0000 [IU] | Freq: Every day | SUBCUTANEOUS | Status: DC

## 2014-09-13 NOTE — Progress Notes (Signed)
Pharmacy note: ancef  58 yo female s/p anterior cervical discectomy then noted with  reexploration of anterior cervical fusion with evacuation of hematoma. She is on scheduled ancef for surgical prophylaxis.   -Spoke with Dr. Annette Stable: To continue ancef 1gm IV q8hr due to  re-exploration and increased risk of infection  Plan -Continue ancef 1gm IV q8h  Hildred Laser, Pharm D 09/13/2014 11:12 AM

## 2014-09-13 NOTE — Procedures (Signed)
Extubation Procedure Note  Patient Details:   Name: Lisa Odom DOB: 1956/06/13 MRN: [id number]   Airway Documentation:  Airway 6.5 mm (Active)  Secured at (cm) 22 cm 09/13/2014  8:57 AM  Measured From Lips 09/13/2014  8:57 AM  Secured Location Right 09/13/2014  3:08 AM  Secured By [employer] 09/13/2014  8:57 AM  Tube Holder Repositioned Yes 09/13/2014  8:57 AM  Cuff Pressure (cm H2O) 24 cm H2O 09/13/2014  3:08 AM  Site Condition Dry 09/13/2014  3:08 AM    Evaluation  O2 sats: stable throughout Complications: No apparent complications Patient did tolerate procedure well. Bilateral Breath Sounds: Clear, Diminished   Yes  Baldwin Jamaica Nannette 09/13/2014, 11:12 AM   PT was positive for cuff leak prior to extubation.

## 2014-09-13 NOTE — Progress Notes (Signed)
Postop day 1. Patient looks good this morning. Comfortable on ventilator. Denies significant pain.  Afebrile. Vitals are stable. Chest x-ray looks good this morning. Lab studies acceptable.  Motor and sensory function of the extremities intact. Wound clean and dry. Next soft. Airway midline.  Status post three-level anterior cervical discectomy and fusion yesterday complicated by postoperative hematoma. Overall doing well today. Plan extubation per critical care medicine's evaluation later this morning. Patient may be mobilized following extubation. Likely be ready go home tomorrow.

## 2014-09-13 NOTE — Progress Notes (Signed)
RASS 0. Passed SBT. No neck swelling. Extubated and looks good without stridor  Filed Vitals:   09/13/14 1100 09/13/14 1200 09/13/14 1300 09/13/14 1500  BP: 121/67 113/47 137/81 110/70  Pulse: 66 82 87 70  Temp:      TempSrc:      Resp: 10 20 13 10   Height:      Weight:      SpO2: 100% 100% 100% 97%   NAD Neck JP drain in place CHest clear anteriorly RRR s M NABS, soft Ext warm without edema  I have reviewed all of today's lab results. Relevant abnormalities are discussed in the A/P section  CXR: NACPD  IMPRESSION: S/p ACDF c/b neck hematoma S/P re-exploration and evacuation of hematoma VDRF, now resolved Stridor, resolved DM II  PLAN: Monitor in ICU post extubation Post op mgmt per NS Advance diet Change SSI to ACHS Resume rest of home meds as able when taking PO meds  Merton Border, MD ; [facility] service Mobile [phone].  After 5:30 PM or weekends, call [phone]

## 2014-09-14 LAB — GLUCOSE, CAPILLARY: GLUCOSE-CAPILLARY: 120 mg/dL — AB (ref 70–99)

## 2014-09-14 MED ORDER — PANTOPRAZOLE SODIUM 40 MG PO TBEC
40.0000 mg | DELAYED_RELEASE_TABLET | Freq: Every day | ORAL | Status: DC
Start: 2014-09-14 — End: 2014-09-14

## 2014-09-14 MED ORDER — CYCLOBENZAPRINE HCL 10 MG PO TABS: 10.0000 mg | ORAL_TABLET | Freq: Three times a day (TID) | ORAL | Status: DC | PRN

## 2014-09-14 MED ORDER — OXYCODONE-ACETAMINOPHEN 5-325 MG PO TABS: 1.0000 | ORAL_TABLET | ORAL | Status: DC | PRN

## 2014-09-14 NOTE — Progress Notes (Signed)
Pt discharged at 11:54. Iv d/cd. Wheeled to car by Psychologist, occupational. Sister to transport pt home. Oxycodone script given to pt. Discharge instructions given to pt.

## 2014-09-14 NOTE — Discharge Instructions (Signed)

## 2014-09-14 NOTE — Discharge Summary (Signed)
Physician Discharge Summary  Patient ID: Lisa Odom MRN: [id number] DOB/AGE: 01/13/1956 58 y.o.  Admit date: 09/12/2014 Discharge date: 09/14/2014  Admission Diagnoses:  Discharge Diagnoses:  Principal Problem:   Spinal stenosis of cervical region Active Problems:   Stenosis of cervical spine with myelopathy   Acute respiratory acidosis   Cervical (neck) region somatic dysfunction   Stenosis of cervical spine   Discharged Condition: good  Hospital Course: 58 year old female admitted for three-level anterior cervical decompression and fusion. Postoperatively patient developed a deep wound hematoma with airway compromise. Patient taken to the operating room for evacuation hematoma. Postoperative she is done very well. She's had no further respiratory distress. Swallowing and talking well. She has some posterior neck pain but no upper extremity symptoms. She has no motor or sensory complaints.  Consults:   Significant Diagnostic Studies:   Treatments:   Discharge Exam: Blood pressure 110/66, pulse 86, temperature 98 F (36.7 C), temperature source Oral, resp. rate 13, height 5\' 1"  (1.549 m), weight 71.5 kg (157 lb 10.1 oz), SpO2 94 %. Awake and alert. Oriented and appropriate. Cranial nerve function intact. Motor and sensory function extremities intact. Wound clean and dry. Chest and abdomen benign.  Disposition: 01-Home or Self Care     Medication List    TAKE these medications        ALPRAZolam 1 MG tablet  Commonly known as:  XANAX  Take 1 mg by mouth 2 (two) times daily as needed for anxiety.     amphetamine-dextroamphetamine 30 MG tablet  Commonly known as:  ADDERALL  Take 30 mg by mouth every morning. Take 30 mg in the morning and 20 mg at lunch at time     amphetamine-dextroamphetamine 20 MG tablet  Commonly known as:  ADDERALL  Take 20 mg by mouth daily. Take 30 mg in the morning and 20 mg at lunch at time     aspirin 81 MG chewable tablet  Chew by mouth  daily.     aspirin-acetaminophen-caffeine 952-841-32 MG per tablet  Commonly known as:  EXCEDRIN MIGRAINE  Take 2 tablets by mouth every 6 (six) hours as needed for migraine.     b complex vitamins capsule  Take 1 capsule by mouth every other day.     Biotin 5 MG Tabs  Take 1 tablet by mouth every other day.     cyclobenzaprine 10 MG tablet  Commonly known as:  FLEXERIL  Take 1 tablet (10 mg total) by mouth 3 (three) times daily as needed for muscle spasms.     DULoxetine 60 MG capsule  Commonly known as:  CYMBALTA  Take 120 mg by mouth daily.     gabapentin 300 MG capsule  Commonly known as:  NEURONTIN  Take one tablet by mouth in the morning and at lunch.  Then two by mouth at bedtime     glucose blood test strip  Commonly known as:  ONE TOUCH ULTRA TEST  Use to check blood sugar daily.  Dx: 250.60     HYDROcodone-acetaminophen 5-325 MG per tablet  Commonly known as:  NORCO/VICODIN  Take 1 tablet by mouth daily as needed for moderate pain.     meloxicam 15 MG tablet  Commonly known as:  MOBIC  Take 1 tablet by mouth  daily     multivitamin tablet  Take 1 tablet by mouth daily.     ondansetron 8 MG tablet  Commonly known as:  ZOFRAN  Take 1 tablet (8 mg total) by mouth  every 8 (eight) hours as needed for nausea or vomiting.     ONETOUCH DELICA LANCETS 34K Misc     oxyCODONE-acetaminophen 5-325 MG per tablet  Commonly known as:  PERCOCET/ROXICET  Take 1-2 tablets by mouth every 4 (four) hours as needed for moderate pain.     pantoprazole 40 MG tablet  Commonly known as:  PROTONIX  Take 1 tablet by mouth  daily     ROBAXIN 500 MG tablet  Generic drug:  methocarbamol  Take 500 mg by mouth daily as needed for muscle spasms.     traMADol 50 MG tablet  Commonly known as:  ULTRAM  Take 1 tablet (50 mg total) by mouth every 8 (eight) hours as needed for severe pain.     Vitamin D (Ergocalciferol) 50000 UNITS Caps capsule  Commonly known as:  DRISDOL  Take 1  capsule (50,000 Units total) by mouth every 7 (seven) days.           Follow-up Information    Follow up with [name], MD.   Specialty:  Neurosurgery   Contact information:   [address] [place] [address] [phone]       Signed: Chelsa Stout A 09/14/2014, 11:11 AM

## 2014-09-18 ENCOUNTER — Encounter (HOSPITAL_COMMUNITY): Payer: Self-pay | Admitting: Neurosurgery

## 2014-10-03 ENCOUNTER — Other Ambulatory Visit: Payer: Self-pay | Admitting: *Deleted

## 2014-10-03 MED ORDER — ONETOUCH DELICA LANCETS 33G MISC: Status: DC

## 2014-10-13 ENCOUNTER — Other Ambulatory Visit: Payer: Self-pay | Admitting: *Deleted

## 2014-10-13 MED ORDER — TRAMADOL HCL 50 MG PO TABS: 50.0000 mg | ORAL_TABLET | Freq: Three times a day (TID) | ORAL | Status: DC | PRN

## 2014-10-13 MED ORDER — ONETOUCH DELICA LANCETS 33G MISC: Status: DC

## 2014-10-13 NOTE — Telephone Encounter (Signed)
Last office visit 08/07/2014.  Last refilled 08/01/2014 for #90 with no refills.  Ok to refill?  Please print to fax in to mail order pharmacy

## 2014-10-13 NOTE — Telephone Encounter (Signed)
Tramadol Rx faxed to [facility] [phone]. 

## 2014-10-13 NOTE — Telephone Encounter (Signed)
Pt left v/m and does not require cb; pt request [facility]; called [facility] and d/c rx and sent rx to [facility] electronically.

## 2014-10-13 NOTE — Addendum Note (Signed)
Addended by: Helene Shoe on: 10/13/2014 04:55 PM   Modules accepted: Orders

## 2014-12-06 ENCOUNTER — Other Ambulatory Visit (HOSPITAL_COMMUNITY): Payer: 59 | Attending: Psychiatry | Admitting: Psychiatry

## 2014-12-06 ENCOUNTER — Encounter (HOSPITAL_COMMUNITY): Payer: Self-pay

## 2014-12-06 DIAGNOSIS — M797 Fibromyalgia: Secondary | ICD-10-CM | POA: Diagnosis not present

## 2014-12-06 DIAGNOSIS — K219 Gastro-esophageal reflux disease without esophagitis: Secondary | ICD-10-CM | POA: Diagnosis not present

## 2014-12-06 DIAGNOSIS — F411 Generalized anxiety disorder: Secondary | ICD-10-CM | POA: Insufficient documentation

## 2014-12-06 DIAGNOSIS — F900 Attention-deficit hyperactivity disorder, predominantly inattentive type: Secondary | ICD-10-CM | POA: Insufficient documentation

## 2014-12-06 DIAGNOSIS — F410 Panic disorder [episodic paroxysmal anxiety] without agoraphobia: Secondary | ICD-10-CM | POA: Insufficient documentation

## 2014-12-06 DIAGNOSIS — R45851 Suicidal ideations: Secondary | ICD-10-CM | POA: Diagnosis not present

## 2014-12-06 DIAGNOSIS — E119 Type 2 diabetes mellitus without complications: Secondary | ICD-10-CM | POA: Diagnosis not present

## 2014-12-06 DIAGNOSIS — F332 Major depressive disorder, recurrent severe without psychotic features: Secondary | ICD-10-CM | POA: Insufficient documentation

## 2014-12-06 DIAGNOSIS — G4710 Hypersomnia, unspecified: Secondary | ICD-10-CM | POA: Diagnosis not present

## 2014-12-06 NOTE — Progress Notes (Signed)
    Daily Group Progress Note  Program: IOP  Group Time: 9:00-10:30  Participation Level: Active  Behavioral Response: Appropriate  Type of Therapy:  Group Therapy  Summary of Progress: Pt. Met with case manager and psychiatrist.      Group Time: 10:30-12:00  Participation Level:  Active  Behavioral Response: Appropriate  Type of Therapy: Psycho-education Group  Summary of Progress: Pt. Participated in grounding and breathing exercises including 5-4-3-2-1, 4-7-8 breathing, and bumble bee breath.   Nancie Neas, LPC
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
## 2014-12-06 NOTE — Progress Notes (Signed)
Psychiatric Assessment Adult  Patient Identification:  Lisa Odom Date of Evaluation:  12/06/2014 Chief Complaint: depression  And anxiety History of Chief Complaint:  Lisa Odom complains of depression with sadness, crying spells, loss of interest,energy, motivation and focus.  She wants to sleep all the time.  She has suicidal thoughts at times but not today.  She has thought of running her car into something but is afraid it will just make her a "vegetable". Anxiety has increased to the point of anxiety attacks, worrying all the time, impaired sleep, hating to go to work and never being able to relax.  She is treated for ADHD as well taking Adderall twice daily.  She also mentioned memory issues such as making her breakfast and forgetting where she put it, putting things in the refrigerator at work that do not belong there, forgetting how to do procedures she should know and just "shuffling papers" to cover herself. Her stressors are multiple physical problems with various joints and chronic pain including myalgia.  Work is a major stress.  Her manager is pressing for her and the employees under her to do more and more work beyond what seems possible..She has missed a lot of work because of surgeries and recovery. She has no support system though she talks to fellow ministers on line (she has a degree in theology).  She sees a psychiatrist but not a therapist.  Very irritable and feels like she might explode at work and hit someone though she never has and that is the opposite of he usual self. Chief Complaint  Patient presents with  . Depression  . Anxiety  . Stress    Anxiety     Review of Systems Physical Exam  Depressive Symptoms: depressed mood, anhedonia, hypersomnia, fatigue, difficulty concentrating, hopelessness, impaired memory, suicidal thoughts without plan, anxiety,  (Hypo) Manic Symptoms:   Elevated Mood:  Negative Irritable Mood:  Yes Grandiosity:   Negative Distractibility:  Yes Labiality of Mood:  Yes Delusions:  Negative Hallucinations:  Yes sees figures in her room and has heard her name being called all her life, also hears things fall and nothing has fallen Impulsivity:  Negative Sexually Inappropriate Behavior:  Negative Financial Extravagance:  No she does shop compulsively but returns the items Flight of Ideas:  Negative  Anxiety Symptoms: Excessive Worry:  Yes Panic Symptoms:  No Agoraphobia:  Negative Obsessive Compulsive: No  Symptoms: None, Specific Phobias:  Negative Social Anxiety:  Negative  Psychotic Symptoms:  Hallucinations: Yes Auditory Visual as described above Delusions:  Negative Paranoia:  No  But does have the feeling that somebody has been in her house or is in her house at times Ideas of Reference:  Negative  PTSD Symptoms: Ever had a traumatic exposure:  Negative Had a traumatic exposure in the last month:  Negative Re-experiencing: Negative None Hypervigilance:  Negative Hyperarousal: Negative None Avoidance: Negative None  Traumatic Brain Injury: Negative na  Past Psychiatric History: Diagnosis: major depression, ADHD, fibromyalgia, ?anxiety disorder  Hospitalizations: none  Outpatient Care: sees psychiatrist  Substance Abuse Care: none  Self-Mutilation: none  Suicidal Attempts: none  Violent Behaviors: none   Past Medical History:   Past Medical History  Diagnosis Date  . Diverticulosis   . Hiatal hernia   . Gastritis   . Migraine   . Depression   . Anxiety   . GERD (gastroesophageal reflux disease)   . Contact lens/glasses fitting     wears contacts or glasses  . Diabetes mellitus without  complication   . Family history of adverse reaction to anesthesia     pts sister has severe N/V  . Panic attacks   . Hemorrhoid   . Neuropathy    History of Loss of Consciousness:  Negative Seizure History:  Negative Cardiac History:  Negative Allergies:   Allergies  Allergen  Reactions  . Sulfonamide Derivatives     REACTION: Rash  . Hydrochlorothiazide Rash   Current Medications:  Current Outpatient Prescriptions  Medication Sig Dispense Refill  . ALPRAZolam (XANAX) 1 MG tablet Take 1 mg by mouth 2 (two) times daily as needed for anxiety.    Marland Kitchen amphetamine-dextroamphetamine (ADDERALL) 20 MG tablet Take 20 mg by mouth daily. Take 30 mg in the morning and 20 mg at lunch at time    . amphetamine-dextroamphetamine (ADDERALL) 30 MG tablet Take 30 mg by mouth every morning. Take 30 mg in the morning and 20 mg at lunch at time    . aspirin 81 MG chewable tablet Chew by mouth daily.    Marland Kitchen aspirin-acetaminophen-caffeine (EXCEDRIN MIGRAINE) 250-250-65 MG per tablet Take 2 tablets by mouth every 6 (six) hours as needed for migraine.     Marland Kitchen b complex vitamins capsule Take 1 capsule by mouth every other day.     . Biotin 5 MG TABS Take 1 tablet by mouth every other day.     . cyclobenzaprine (FLEXERIL) 10 MG tablet Take 1 tablet (10 mg total) by mouth 3 (three) times daily as needed for muscle spasms. 30 tablet 0  . DULoxetine (CYMBALTA) 60 MG capsule Take 120 mg by mouth daily.    Marland Kitchen gabapentin (NEURONTIN) 300 MG capsule Take one tablet by mouth in the morning and at lunch.  Then two by mouth at bedtime 120 capsule 1  . glucose blood (ONE TOUCH ULTRA TEST) test strip Use to check blood sugar daily.  Dx: 250.60 100 each 3  . HYDROcodone-acetaminophen (NORCO/VICODIN) 5-325 MG per tablet Take 1 tablet by mouth daily as needed for moderate pain. 20 tablet 0  . lurasidone (LATUDA) 40 MG TABS tablet Take 40 mg by mouth daily with breakfast.    . meloxicam (MOBIC) 15 MG tablet Take 1 tablet by mouth  daily (Patient taking differently: Take 1 tablet by mouth  daily as needed) 90 tablet 1  . methocarbamol (ROBAXIN) 500 MG tablet Take 500 mg by mouth daily as needed for muscle spasms.     . Multiple Vitamin (MULTIVITAMIN) tablet Take 1 tablet by mouth daily.    . ondansetron (ZOFRAN) 8 MG  tablet Take 1 tablet (8 mg total) by mouth every 8 (eight) hours as needed for nausea or vomiting. 20 tablet 0  . ONETOUCH DELICA LANCETS 98P MISC Use to check blood sugar daily.  Dx: E11.49 100 each 3  . oxyCODONE-acetaminophen (PERCOCET/ROXICET) 5-325 MG per tablet Take 1-2 tablets by mouth every 4 (four) hours as needed for moderate pain. 80 tablet 0  . pantoprazole (PROTONIX) 40 MG tablet Take 1 tablet by mouth  daily 90 tablet 0  . traMADol (ULTRAM) 50 MG tablet Take 1 tablet (50 mg total) by mouth every 8 (eight) hours as needed for severe pain. 90 tablet 0  . Vitamin D, Ergocalciferol, (DRISDOL) 50000 UNITS CAPS capsule Take 1 capsule (50,000 Units total) by mouth every 7 (seven) days. 12 capsule 0   No current facility-administered medications for this visit.    Previous Psychotropic Medications:  Medication Dose   see above list  na  Substance Abuse History in the last 12 months:none                                                                                                   Medical Consequences of Substance Abuse: none  Legal Consequences of Substance Abuse: none  Family Consequences of Substance Abuse: none  Blackouts:  Negative DT's:  Negative Withdrawal Symptoms:  Negative None  Social History: Current Place of Residence: [place] Place of Birth: [place] Family Members: grown son Marital Status:  Divorced Children: 1  Sons: 1  Daughters: 0 Relationships: none close Education:  Dentist Problems/Performance: good Religious Beliefs/Practices: Christian History of Abuse: none Ship broker History:  None. Legal History: none Hobbies/Interests: reading  Family History:   Family History  Problem Relation Age of Onset  . Heart disease Brother   . Ovarian cancer Cousin   . Breast cancer Maternal Grandmother     grandmother  . Diabetes Maternal Aunt     aunt  . Diabetes Sister   . Anxiety  disorder Sister   . Alcohol abuse Maternal Uncle     uncle  . Diabetes Paternal Aunt   . Alcohol abuse Paternal Uncle     Mental Status Examination/Evaluation: Objective:  Appearance: Well Groomed  Eye Contact::  Good  Speech:  Clear and Coherent  Volume:  Normal  Mood:  anxious  Affect:  Appropriate and Congruent  Thought Process:  Coherent and Logical  Orientation:  Full (Time, Place, and Person)  Thought Content:  hallucinations seem more like anxiety oriented  Suicidal Thoughts:  Yes.  without intent/plan  Homicidal Thoughts:  No  Judgement:  Good  Insight:  Good  Psychomotor Activity:  Normal  Akathisia:  Negative  Handed:  Right  AIMS (if indicated):  0  Assets:  Communication Skills Desire for Improvement Financial Resources/Insurance Housing Talents/Skills Transportation Vocational/Educational    Laboratory/X-Ray Psychological Evaluation(s)   none  none   Assessment:  Major depression, recurrent, severe without psychotic features, ADHD, inattentive, Generalized anxiety disorder, rule out mild cognitive impairment vs effects of depression and anxiety on memory                  Treatment Plan/Recommendations:  Plan of Care: daily group therapy  Laboratory:  none  Psychotherapy: group therapy  Medications: continue current medications  Routine PRN Medications:  Yes  Consultations: none  Safety Concerns:  none  Other:      Jens Som, MD 3/9/201611:57 AM

## 2014-12-07 ENCOUNTER — Other Ambulatory Visit (HOSPITAL_COMMUNITY): Payer: 59 | Admitting: Psychiatry

## 2014-12-07 DIAGNOSIS — F332 Major depressive disorder, recurrent severe without psychotic features: Secondary | ICD-10-CM | POA: Diagnosis not present

## 2014-12-08 ENCOUNTER — Other Ambulatory Visit (HOSPITAL_COMMUNITY): Payer: 59 | Admitting: Psychiatry

## 2014-12-08 DIAGNOSIS — F332 Major depressive disorder, recurrent severe without psychotic features: Secondary | ICD-10-CM

## 2014-12-08 NOTE — Progress Notes (Signed)
    Daily Group Progress Note  Program: IOP  Group Time: 9:00-10:30  Participation Level: Active  Behavioral Response: Appropriate  Type of Therapy:  Group Therapy  Summary of Progress: Pt. Presented with bright affect, smiled appropriately, and made appropriate eye contact. Pt. Discussed work history in human resources, insomnia, difficulty focusing on work related tasks, and work stress.      Group Time: 10:30-12:00  Participation Level:  Active  Behavioral Response: Appropriate  Type of Therapy: Psycho-education Group  Summary of Progress: Pt. Participated in instruction and discussion about mindfulness and grounding exercises including 5-4-3-2-1, tapping, aromatherapy, and breathwork.   Nancie Neas, LPC

## 2014-12-08 NOTE — Progress Notes (Signed)
    Daily Group Progress Note  Program: IOP  Group Time: 9:00-10:30  Participation Level: Active  Behavioral Response: Appropriate  Type of Therapy:  Group Therapy  Summary of Progress: Pt. Presented with brightened affect, smiled appropriately. Pt. Continues to report her work as her primary stressor. Pt. Discussed positive relationships with her son and one of her four sisters. Pt. Discussed long history of insomnia and difficulty focusing on tasks at home and at work.      Group Time: 10:30-12:00  Participation Level:  Active  Behavioral Response: Appropriate  Type of Therapy: Psycho-education Group  Summary of Progress: Pt. Watched and discussed video on positive psychology by Orvan Seen and suggestions for being happier including expressing gratitude, random acts of kindness, exercise, meditation, and journaling.  Nancie Neas, LPC

## 2014-12-11 ENCOUNTER — Other Ambulatory Visit (HOSPITAL_COMMUNITY): Payer: 59 | Admitting: Psychiatry

## 2014-12-11 DIAGNOSIS — F332 Major depressive disorder, recurrent severe without psychotic features: Secondary | ICD-10-CM

## 2014-12-11 NOTE — Progress Notes (Signed)
    Daily Group Progress Note  Program: IOP  Group Time: 9:00-10:30  Participation Level: Active  Behavioral Response: Appropriate  Type of Therapy:  Psycho-education Group  Summary of Progress: Pt. Participated in medication education group with [place].     Group Time: 10:30-12:00  Participation Level:  Active  Behavioral Response: Appropriate  Type of Therapy: Group Therapy  Summary of Progress: Pt. Reported that she had an active weekend, went to a play and out to dinner with sister and friend, took her granddaughter shopping on Saturday. Pt. Reported that she continues to have trouble sleeping and focusing on household tasks.   Nancie Neas, LPC

## 2014-12-12 ENCOUNTER — Other Ambulatory Visit (HOSPITAL_COMMUNITY): Payer: 59 | Admitting: Psychiatry

## 2014-12-12 DIAGNOSIS — F332 Major depressive disorder, recurrent severe without psychotic features: Secondary | ICD-10-CM | POA: Diagnosis not present

## 2014-12-13 ENCOUNTER — Other Ambulatory Visit (HOSPITAL_COMMUNITY): Payer: 59 | Admitting: Psychiatry

## 2014-12-13 DIAGNOSIS — F332 Major depressive disorder, recurrent severe without psychotic features: Secondary | ICD-10-CM

## 2014-12-13 NOTE — Progress Notes (Signed)
    Daily Group Progress Note  Program: IOP  Group Time: 9:00-10:30  Participation Level: Active  Behavioral Response: Appropriate  Type of Therapy:  Group Therapy  Summary of Progress: Pt. Presented as lethargic and restless. Pt. Reported that she fell asleep on the sofa for approximately 10 hours and forgot to take her medication.      Group Time: 10:30-12:00  Participation Level:  Active  Behavioral Response: Appropriate  Type of Therapy: Psycho-education Group  Summary of Progress: Pt. Participated in progressive muscle relaxation activity. Pt. Reported that she felt more relaxed after the activity.   Nancie Neas, LPC

## 2014-12-13 NOTE — Progress Notes (Signed)
    Daily Group Progress Note  Program: IOP  Group Time: 9:00-10:30  Participation Level: Active  Behavioral Response: Appropriate  Type of Therapy:  Group Therapy  Summary of Progress: Pt. Presented as tired, but attentive and appeared to be experiencing physical pain. Pt. Reported that was in pain due to neuropathy and fibromialgia.      Group Time: 10:30-12:00  Participation Level:  Active  Behavioral Response: Appropriate  Type of Therapy: Psycho-education Group  Summary of Progress: Pt. Participated in discussion about developing self-care/comfort packages.   Nancie Neas, LPC

## 2014-12-14 ENCOUNTER — Other Ambulatory Visit (HOSPITAL_COMMUNITY): Payer: 59 | Admitting: Psychiatry

## 2014-12-14 DIAGNOSIS — F332 Major depressive disorder, recurrent severe without psychotic features: Secondary | ICD-10-CM | POA: Diagnosis not present

## 2014-12-14 NOTE — Progress Notes (Signed)
    Daily Group Progress Note  Program: IOP  Group Time: 9201-0071  Participation Level: Active  Behavioral Response: Appropriate, Sharing and Attention-Seeking  Type of Therapy:  Group Therapy  Summary of Progress: Patient approach writer before group started and stated she would be leaving early due to a migraine.  It appears, pt did stay for both groups.  Pt stated that she send cards to people as a small gesture.  Apparently, she heard from someone who told her how much this has meant to him all these years.  Pt states she is a very giving person and would like to find balance.  Pt was tearful at times.     Group Time: 1045-1200  Participation Level:  Active  Behavioral Response: Appropriate  Type of Therapy: Psycho-education Group  Summary of Progress: Discharge Planning:  Discussed setting treatment goals for MH-IOP, assessing current resources, exploring new resources, making a plan for keeping yourself safe while in MH-IOP and after discharge.

## 2014-12-15 ENCOUNTER — Other Ambulatory Visit (HOSPITAL_COMMUNITY): Payer: 59

## 2014-12-15 ENCOUNTER — Telehealth (HOSPITAL_COMMUNITY): Payer: Self-pay | Admitting: Psychiatry

## 2014-12-18 ENCOUNTER — Other Ambulatory Visit (HOSPITAL_COMMUNITY): Payer: 59 | Admitting: Psychiatry

## 2014-12-18 DIAGNOSIS — F332 Major depressive disorder, recurrent severe without psychotic features: Secondary | ICD-10-CM | POA: Diagnosis not present

## 2014-12-18 NOTE — Progress Notes (Signed)
Patient ID: Lisa Odom, female   DOB: Dec 16, 1955, 59 y.o.   MRN: [id number] Discharge Note  Patient:  Lisa Odom is an 59 y.o., female DOB:  [birth year]  Date of Admission:  12/06/2014  Date of Discharge:  12/18/2014  Reason for Admission:depression and anxiety  IOP Course:  Lisa Odom participated in group and attended well.  The group was not helpful she said as she still has the pain, the forgetfulness and the secondary anxiety about her job and her future.  She says she is not a group person and I agree.  She will do better one on one.  The pain is constant and makes it hard to concentrate or to sleep or relax.  The forgetfulness makes her feel unable to do her job and she is concerned that she might need to live with her sister.  Nevertheless she enjoyed the group and her peers appreciated her feedback.  Mental Status at Lisa Odom, oriented, depressed and anxious. Not suicidal. No psychosis.  Lab Results: No results found for this or any previous visit (from the past 48 hour(s)).   Current outpatient prescriptions:  .  ALPRAZolam (XANAX) 1 MG tablet, Take 1 mg by mouth 2 (two) times daily as needed for anxiety., Disp: , Rfl:  .  amphetamine-dextroamphetamine (ADDERALL) 20 MG tablet, Take 20 mg by mouth daily. Take 30 mg in the morning and 20 mg at lunch at time, Disp: , Rfl:  .  amphetamine-dextroamphetamine (ADDERALL) 30 MG tablet, Take 30 mg by mouth every morning. Take 30 mg in the morning and 20 mg at lunch at time, Disp: , Rfl:  .  aspirin 81 MG chewable tablet, Chew by mouth daily., Disp: , Rfl:  .  aspirin-acetaminophen-caffeine (EXCEDRIN MIGRAINE) 250-250-65 MG per tablet, Take 2 tablets by mouth every 6 (six) hours as needed for migraine. , Disp: , Rfl:  .  b complex vitamins capsule, Take 1 capsule by mouth every other day. , Disp: , Rfl:  .  Biotin 5 MG TABS, Take 1 tablet by mouth every other day. , Disp: , Rfl:  .  cyclobenzaprine (FLEXERIL) 10 MG tablet, Take 1 tablet (10  mg total) by mouth 3 (three) times daily as needed for muscle spasms., Disp: 30 tablet, Rfl: 0 .  DULoxetine (CYMBALTA) 60 MG capsule, Take 120 mg by mouth daily., Disp: , Rfl:  .  gabapentin (NEURONTIN) 300 MG capsule, Take one tablet by mouth in the morning and at lunch.  Then two by mouth at bedtime, Disp: 120 capsule, Rfl: 1 .  glucose blood (ONE TOUCH ULTRA TEST) test strip, Use to check blood sugar daily.  Dx: 250.60, Disp: 100 each, Rfl: 3 .  HYDROcodone-acetaminophen (NORCO/VICODIN) 5-325 MG per tablet, Take 1 tablet by mouth daily as needed for moderate pain., Disp: 20 tablet, Rfl: 0 .  lurasidone (LATUDA) 40 MG TABS tablet, Take 40 mg by mouth daily with breakfast., Disp: , Rfl:  .  meloxicam (MOBIC) 15 MG tablet, Take 1 tablet by mouth  daily (Patient taking differently: Take 1 tablet by mouth  daily as needed), Disp: 90 tablet, Rfl: 1 .  methocarbamol (ROBAXIN) 500 MG tablet, Take 500 mg by mouth daily as needed for muscle spasms. , Disp: , Rfl:  .  Multiple Vitamin (MULTIVITAMIN) tablet, Take 1 tablet by mouth daily., Disp: , Rfl:  .  ondansetron (ZOFRAN) 8 MG tablet, Take 1 tablet (8 mg total) by mouth every 8 (eight) hours as needed for nausea or vomiting., Disp:  20 tablet, Rfl: 0 .  ONETOUCH DELICA LANCETS 59Y MISC, Use to check blood sugar daily.  Dx: E11.49, Disp: 100 each, Rfl: 3 .  oxyCODONE-acetaminophen (PERCOCET/ROXICET) 5-325 MG per tablet, Take 1-2 tablets by mouth every 4 (four) hours as needed for moderate pain., Disp: 80 tablet, Rfl: 0 .  pantoprazole (PROTONIX) 40 MG tablet, Take 1 tablet by mouth  daily, Disp: 90 tablet, Rfl: 0 .  traMADol (ULTRAM) 50 MG tablet, Take 1 tablet (50 mg total) by mouth every 8 (eight) hours as needed for severe pain., Disp: 90 tablet, Rfl: 0 .  Vitamin D, Ergocalciferol, (DRISDOL) 50000 UNITS CAPS capsule, Take 1 capsule (50,000 Units total) by mouth every 7 (seven) days., Disp: 12 capsule, Rfl: 0  Axis Diagnosis:  Major depression,  recurrent severe without psychosis.  ADHD inattentive type.  Generalized anxiety disorder.  Rule out mild cognitive impairment vs depression and anxiety   Level of Care:  IOP  Discharge destination:  Other:  return to outpatient treatment with psychiatrist and therapist  Is patient on multiple antipsychotic therapies at discharge:  No    Has Patient had three or more failed trials of antipsychotic monotherapy by history:  Negative  Patient phone:  [phone] (home)  Patient address:   [place] [address],   Follow-up recommendations:  Activity:  continue current activity level Diet:  continue current diet   Comments:  Chronic pain and forgetfulness remain issues  The patient received suicide prevention pamphlet:  Yes Belongings returned:  na [name] 12/18/2014, 11:08 AM

## 2014-12-18 NOTE — Progress Notes (Signed)
Lisa Odom is a 59 y.o. , divorced, employed, [place] American female, who was referred per Dr. Toy Care; treatment for worsening depressive and anxiety symptoms (panic attacks), with SI (no plan or intent). Discussed safety options with pt at length, pt able to contract for safety. Stated her deterrent would be that she wouldn't succeed. Denied HI. Pt admitted to A/V hallucinations. Stated she sees shadows in her home and hear items falling. Other symptoms included: Decreased sleep (nightmares), fatigue, poor concentration, crying spells, anhedonia, low energy, no motivation, increased irritability and ruminating thoughts. Stated she's been suffering with the above symptoms for years. Stressors included: 1) Multiple Medical Issues: Diabetes, Neuropathy, Fibromyalgia, Migraines, hx of carpal tunnel surgery, hx of rotator cuff surgery, Ulcers and Knee Pain. In December 2015, pt had cervical spine surgery. Apparently a lung collapsed and left pt on life support. 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 According to pt, she has been moved to the middle of the floor near the copier and water fountain. "I am out in the open now. It's very hard for me to focus on what I am doing." Pt stated she is required to do more tasks and the stress level has increased. Pt had recently returned back to work on 11-06-14 after being out due to spine surgery.  Pt denied any previous psychiatric hospitalizations. Denied any previous suicide attempts or gestures. Pt has been seeing Dr. Toy Care for two years. Family HX: Sister (PTSD). Pt completed MH-IOP today.  Reports that the groups were somewhat helpful.  "It gave me somewhere to go every morning and I did learn some things."  Pt is anxious about returning to work.  "I don't know what is going to happen, because I know that I just can't do the job."  Pt denies SI/HI or A/V hallucinations.  A:  D/C today.  Will follow  up with Dr. Toy Care on 12-20-14 8:30 a.m..  Encouraged pt to inquire about getting an appointment with Nunzio Cobbs, LCSW as soon as possible.  Dr. Toy Care will determine RTW date.  R:  Pt receptive.

## 2014-12-18 NOTE — Progress Notes (Signed)
    Daily Group Progress Note  Program: IOP  Group Time: 9:00-10:30  Participation Level: Active  Behavioral Response: Appropriate  Type of Therapy:  Psycho-education Group  Summary of Progress: Pt. Participated in medication education group with [place].     Group Time: 10:30-12:00  Participation Level:  Active  Behavioral Response: Appropriate  Type of Therapy: Psycho-education Group  Summary of Progress: Pt. Watched and discussed [employer] video on developing vulnerability as resistance to shame, and use of numbing as coping with the pain of vulnerability.  Nancie Neas, LPC

## 2014-12-19 ENCOUNTER — Other Ambulatory Visit (HOSPITAL_COMMUNITY): Payer: 59

## 2014-12-20 ENCOUNTER — Other Ambulatory Visit (HOSPITAL_COMMUNITY): Payer: 59

## 2014-12-20 LAB — HM DIABETES EYE EXAM

## 2014-12-21 ENCOUNTER — Other Ambulatory Visit (HOSPITAL_COMMUNITY): Payer: 59

## 2014-12-22 ENCOUNTER — Other Ambulatory Visit (HOSPITAL_COMMUNITY): Payer: 59

## 2014-12-24 ENCOUNTER — Other Ambulatory Visit: Payer: Self-pay | Admitting: Family Medicine

## 2014-12-24 NOTE — Telephone Encounter (Signed)
Last office visit 08/17/2014.  Last refilled 07/28/2014 for #90 with 1 refill.  Ok to refill?

## 2014-12-25 ENCOUNTER — Other Ambulatory Visit (HOSPITAL_COMMUNITY): Payer: 59

## 2014-12-26 ENCOUNTER — Other Ambulatory Visit (HOSPITAL_COMMUNITY): Payer: 59

## 2014-12-27 ENCOUNTER — Other Ambulatory Visit (HOSPITAL_COMMUNITY): Payer: 59

## 2014-12-28 ENCOUNTER — Other Ambulatory Visit (HOSPITAL_COMMUNITY): Payer: 59

## 2014-12-29 ENCOUNTER — Other Ambulatory Visit (HOSPITAL_COMMUNITY): Payer: 59

## 2015-01-01 ENCOUNTER — Other Ambulatory Visit (HOSPITAL_COMMUNITY): Payer: 59

## 2015-01-02 ENCOUNTER — Other Ambulatory Visit (HOSPITAL_COMMUNITY): Payer: 59

## 2015-01-03 ENCOUNTER — Other Ambulatory Visit (HOSPITAL_COMMUNITY): Payer: 59

## 2015-01-04 ENCOUNTER — Other Ambulatory Visit (HOSPITAL_COMMUNITY): Payer: 59

## 2015-01-05 ENCOUNTER — Other Ambulatory Visit (HOSPITAL_COMMUNITY): Payer: 59

## 2015-01-08 ENCOUNTER — Other Ambulatory Visit (HOSPITAL_COMMUNITY): Payer: 59

## 2015-01-09 ENCOUNTER — Other Ambulatory Visit (HOSPITAL_COMMUNITY): Payer: 59

## 2015-01-10 ENCOUNTER — Other Ambulatory Visit (HOSPITAL_COMMUNITY): Payer: 59

## 2015-01-11 ENCOUNTER — Other Ambulatory Visit (HOSPITAL_COMMUNITY): Payer: 59

## 2015-01-12 ENCOUNTER — Other Ambulatory Visit (HOSPITAL_COMMUNITY): Payer: 59

## 2015-01-15 ENCOUNTER — Other Ambulatory Visit (HOSPITAL_COMMUNITY): Payer: 59

## 2015-01-16 ENCOUNTER — Other Ambulatory Visit (HOSPITAL_COMMUNITY): Payer: 59

## 2015-01-17 ENCOUNTER — Other Ambulatory Visit (HOSPITAL_COMMUNITY): Payer: 59

## 2015-01-24 IMAGING — CR DG ABD PORTABLE 1V
2 series · 2 of 2 positions shown · non-contrast
Comparison: None.

CLINICAL DATA: Nasogastric tube placement

EXAM:
PORTABLE ABDOMEN - 1 VIEW

[supine ap (1 of 2)]
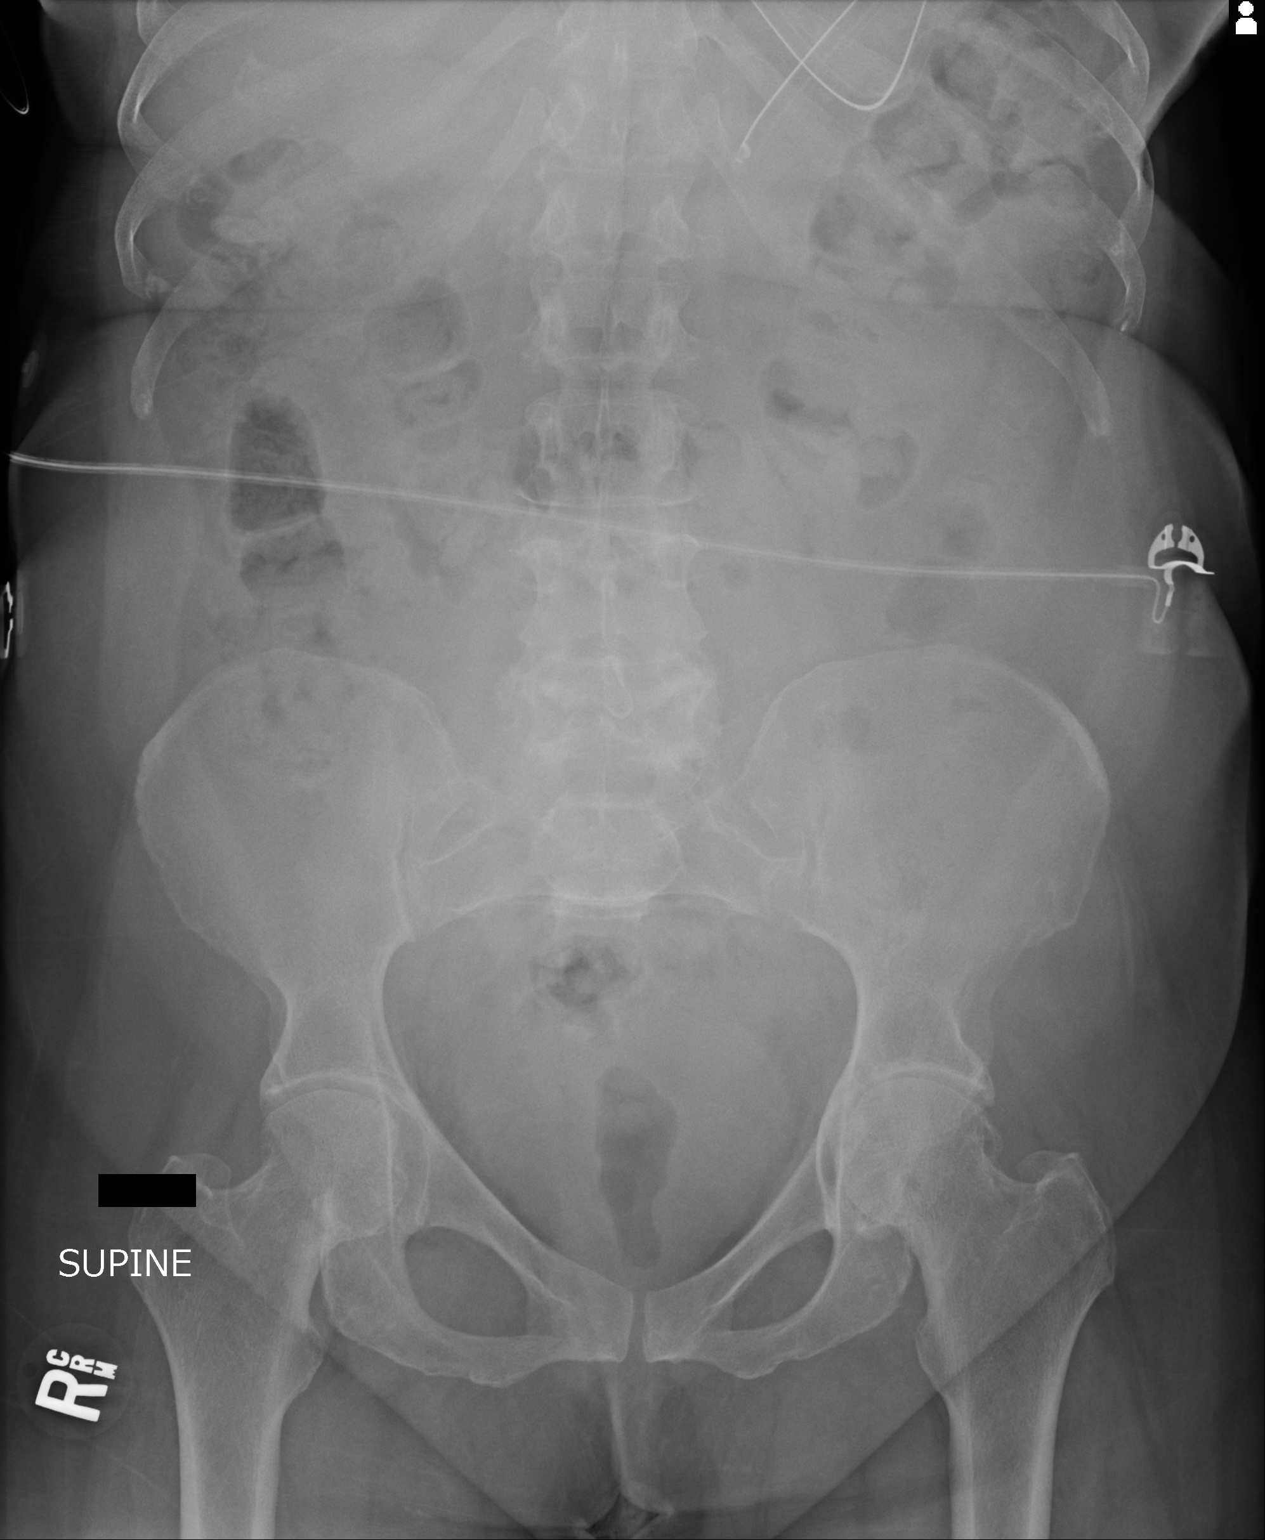

[supine ap (2 of 2)]
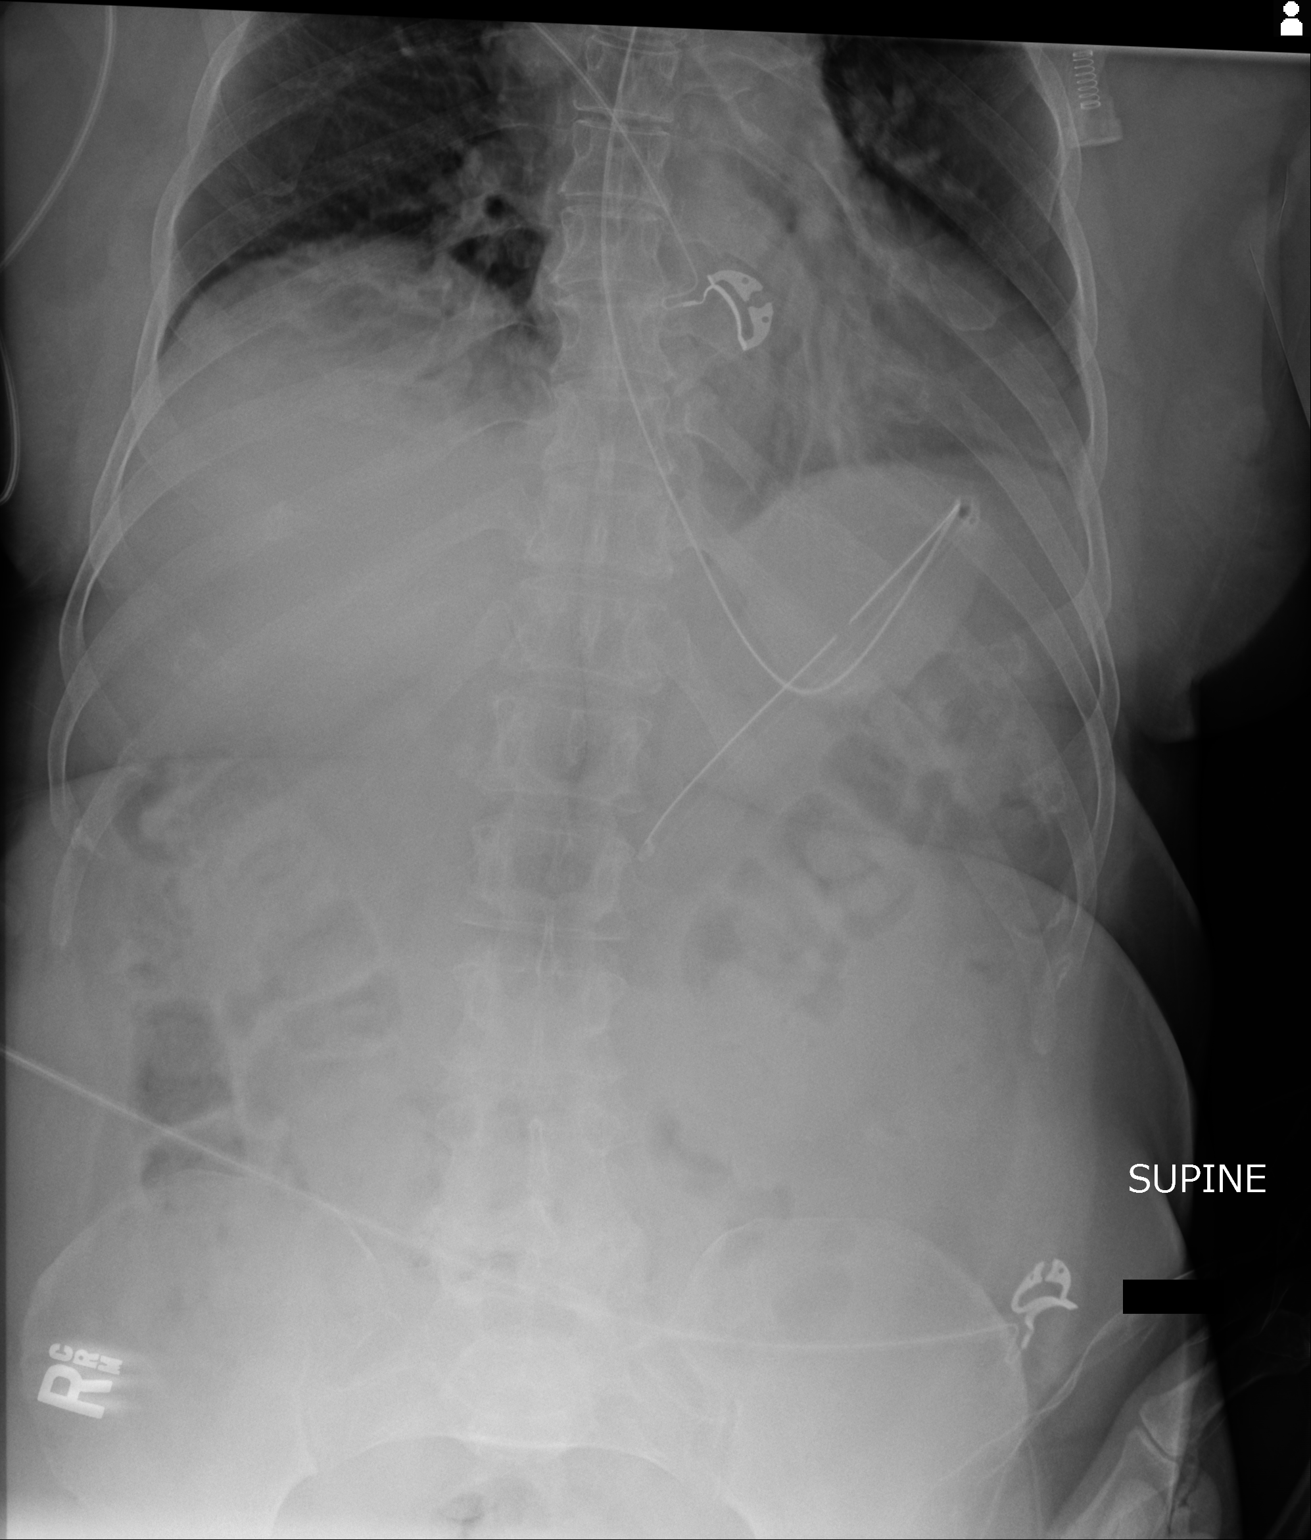

[2 of 2 positions shown; findings below may reference images not displayed]

FINDINGS: Nasogastric tube tip and side port are in the stomach. There is
diffuse stool throughout the colon. The overall bowel gas pattern is
unremarkable. No obstruction or free air. No abnormal
calcifications. Lung bases are clear.
IMPRESSION: Diffuse stool throughout colon. Overall bowel gas pattern
unremarkable. Nasogastric tube tip and side port in stomach.

## 2015-02-06 ENCOUNTER — Encounter: Payer: Self-pay | Admitting: Family Medicine

## 2015-02-06 ENCOUNTER — Ambulatory Visit (INDEPENDENT_AMBULATORY_CARE_PROVIDER_SITE_OTHER): Payer: 59 | Admitting: Family Medicine

## 2015-02-06 VITALS — BP 118/72 | HR 96 | Temp 97.8°F | Ht 61.5 in | Wt 151.2 lb

## 2015-02-06 DIAGNOSIS — R52 Pain, unspecified: Secondary | ICD-10-CM

## 2015-02-06 DIAGNOSIS — E1142 Type 2 diabetes mellitus with diabetic polyneuropathy: Secondary | ICD-10-CM

## 2015-02-06 DIAGNOSIS — R42 Dizziness and giddiness: Secondary | ICD-10-CM | POA: Diagnosis not present

## 2015-02-06 DIAGNOSIS — M792 Neuralgia and neuritis, unspecified: Secondary | ICD-10-CM | POA: Diagnosis not present

## 2015-02-06 DIAGNOSIS — F332 Major depressive disorder, recurrent severe without psychotic features: Secondary | ICD-10-CM

## 2015-02-06 DIAGNOSIS — E1140 Type 2 diabetes mellitus with diabetic neuropathy, unspecified: Secondary | ICD-10-CM | POA: Diagnosis not present

## 2015-02-06 NOTE — Progress Notes (Signed)
Subjective:    Patient ID: Lisa Odom, female    DOB: Apr 19, 1956, 59 y.o.   MRN: [id number]  HPI  59 year old female with history of HTN, DM with neuropathy, severe recurrent depression, cervical spinal stenosis and total body pain presents with continued pain all over and requests referral to neurology.   She is followed for panic disorder and severe depression by behavioral health ( Dr. Buddy Duty). She is on alprazolam, cymbalta and latuda. On adderall for ADD/somnolence as well.  She went through three level anterior cervical decompression with Dr. Trenton Gammon in 08/2015. On gabapentin, meloxicam for pain.  She has been out of work since 11/29/2014 for psychiatric issues.   She is no longer taking tramadol, hydrocodone or oxycodone for pain.   Past lab eval of fatigue and body pain: 07/2014 vit D low ( started vit D). Felt better initially  With supplementation) then worse again. Nml TSH,cbc nml ,sed rate nml, nml ANA CK was elevated at 398  Given this she was referred to rheumatology ( [phone], ? Dr. Amil Amen) at last appt in 07/2014.  She saw rheum in past, she did not care for them.  She reports her neuropathy is worse.. Burning in feet,  but now spread up to thighs , hands,across abdomen. On gabapentin 300/300/600. No SE.  She has noted some improvement in pain in arms since cervical stenosis surgery.  Also feels doubling cymbalta helped with the surgery.  Due for A1C. She has been walking some. With latuda CBG have gone from 90 to 140.  Social History /Family History/Past Medical History reviewed and updated if needed. [place] with lupus and fibromyalgia.  Review of Systems  All other systems reviewed and are negative.      Objective:   Physical Exam  Constitutional: Vital signs are normal. She appears well-developed and well-nourished. She is cooperative.  Non-toxic appearance. She does not appear ill. No distress.  Fatigued appearing.  HENT:  Head: Normocephalic.  Right  Ear: Hearing, tympanic membrane, external ear and ear canal normal. Tympanic membrane is not erythematous, not retracted and not bulging.  Left Ear: Hearing, tympanic membrane, external ear and ear canal normal. Tympanic membrane is not erythematous, not retracted and not bulging.  Nose: No mucosal edema or rhinorrhea. Right sinus exhibits no maxillary sinus tenderness and no frontal sinus tenderness. Left sinus exhibits no maxillary sinus tenderness and no frontal sinus tenderness.  Mouth/Throat: Uvula is midline, oropharynx is clear and moist and mucous membranes are normal.  Eyes: Conjunctivae, EOM and lids are normal. Pupils are equal, round, and reactive to light. Lids are everted and swept, no foreign bodies found.  Neck: Trachea normal and normal range of motion. Neck supple. Carotid bruit is not present. No thyroid mass and no thyromegaly present.  Cardiovascular: Normal rate, regular rhythm, S1 normal, S2 normal, normal heart sounds, intact distal pulses and normal pulses.  Exam reveals no gallop and no friction rub.   No murmur heard. Pulmonary/Chest: Effort normal and breath sounds normal. No tachypnea. No respiratory distress. She has no decreased breath sounds. She has no wheezes. She has no rhonchi. She has no rales.  Abdominal: Soft. Normal appearance and bowel sounds are normal. There is no tenderness.  Neurological: She is alert. She has normal strength. No cranial nerve deficit or sensory deficit. She displays a negative Romberg sign.  Trigger points 18/18  Skin: Skin is warm, dry and intact. No rash noted.  Psychiatric: Her speech is normal and behavior is normal.  Judgment and thought content normal. Her mood appears not anxious. Cognition and memory are normal. She does not exhibit a depressed mood.          Assessment & Plan:

## 2015-02-06 NOTE — Progress Notes (Signed)
Pre visit review using our clinic review tool, if applicable. No additional management support is needed unless otherwise documented below in the visit note. 

## 2015-02-06 NOTE — Patient Instructions (Addendum)
Work on [employer], low carb and regular exercise. Check fasting labs at [facility].  Stop at front desk to set up referral to neuro.

## 2015-02-13 ENCOUNTER — Other Ambulatory Visit: Payer: Self-pay | Admitting: *Deleted

## 2015-02-13 MED ORDER — VITAMIN D (ERGOCALCIFEROL) 1.25 MG (50000 UNIT) PO CAPS: 50000.0000 [IU] | ORAL_CAPSULE | ORAL | Status: DC

## 2015-02-13 NOTE — Telephone Encounter (Signed)
Lab results called to Ms. Shadrick as instructed by Dr. Diona Browner.  While on the phone Ms. Hagner states she should of had Dr. Diona Browner check her vitamin D level again because she never completed the full 12 week course of the high dose Vitamin D. I advised Ms. Georgiou that Dr. Diona Browner would probably want to send her in a new prescription for the Vit D 50,000 units and have her complete the full course before checking another Vit D level.  She also states she needs a pap smear.  I recommended that she schedule a CPE with fasting lab prior in three months. That way she would have to time work on her cholesterol as well as complete her 12 week course of vit d and we could recheck those test at that time.  Please advise if ok to send in Rx for Vit D.

## 2015-02-28 ENCOUNTER — Encounter: Payer: Self-pay | Admitting: Internal Medicine

## 2015-03-01 NOTE — Assessment & Plan Note (Signed)
Most celar cause is fibromyalgia.

## 2015-03-01 NOTE — Assessment & Plan Note (Signed)
Neuropathy worsening . Request second opinion from different neurologist.

## 2015-03-01 NOTE — Assessment & Plan Note (Signed)
Poor control contributing to body pain. Followed by psychiatry. In processes of med adjustment.

## 2015-03-01 NOTE — Assessment & Plan Note (Signed)
Due for re-eval. Encouraged exercise, weight loss, healthy eating habits.

## 2015-03-19 ENCOUNTER — Encounter: Payer: Self-pay | Admitting: Neurology

## 2015-03-19 ENCOUNTER — Ambulatory Visit (INDEPENDENT_AMBULATORY_CARE_PROVIDER_SITE_OTHER): Payer: 59 | Admitting: Neurology

## 2015-03-19 VITALS — BP 160/90 | HR 105 | Ht 61.5 in | Wt 155.6 lb

## 2015-03-19 DIAGNOSIS — F329 Major depressive disorder, single episode, unspecified: Secondary | ICD-10-CM

## 2015-03-19 DIAGNOSIS — R52 Pain, unspecified: Secondary | ICD-10-CM | POA: Diagnosis not present

## 2015-03-19 DIAGNOSIS — F32A Depression, unspecified: Secondary | ICD-10-CM

## 2015-03-19 DIAGNOSIS — F411 Generalized anxiety disorder: Secondary | ICD-10-CM

## 2015-03-19 DIAGNOSIS — R208 Other disturbances of skin sensation: Secondary | ICD-10-CM

## 2015-03-19 DIAGNOSIS — R202 Paresthesia of skin: Secondary | ICD-10-CM | POA: Diagnosis not present

## 2015-03-19 NOTE — Progress Notes (Signed)
Lisa Odom - Initial Visit   Date: 03/19/2015   Lisa Odom MRN: [id number] DOB: 1956/04/08   Dear Dr. Diona Browner:   Thank you for your kind referral of Lisa Odom for consultation of whole body pain. Although her history is well known to you, please allow [place] to reiterate it for the purpose of our medical record. The patient was accompanied to the clinic by self.    History of Present Illness: Lisa Odom is a 59 y.o. right-handed African American female with hypertension, well-controlled diabetes mellitus, severe depression (followed by Dr. Toy Care), right CTS release, cervical spinal stenosis s/p anterior cervical decompression (08/2014) presenting for evaluation of whole body pain.    Starting in 2013, she developed burning sensation of the feet and lower legs which ultimately led to her diagnosis of neuropathy.  She takes gabapentin 300/300/600 for burning pain which significantly helps her feet pain.  About 57-month ago, she started burning in her hands, arms, and stomach.  She reports having hypersensitivity to clothing or light pressure.  Nothing seems to help her pain, except sleeping.  She feels weak and endorses falling a few times.   She also complains of whole body pain for the past three years which achy, described as throbbing pain of her bones.  Pain is constant and worse with weather changes.  She has tried Aleve without any relief.  Recently evaluated by her PCP with thorough work-up (see below) and exam notable for 18/18 trigger points consistent with fibromyalgia.   She also complains of dizziness, described as near passing out when she gets up too quickly or rolls in bed.  Denies room spinning sensation.  She has no passed out.    Significant psychiatric history of severe depression.  She sleeps every other night. She reports crying a lot at night.  She started seeing counselor and psychiatrist.  She is planning on moving in with her  sister. She has been out of work since 11/29/2014 for psychiatric issues.  She has previously seen neurology (Dr. LMelton Alar for migraines. She reports never having a medication that worked.    Out-side paper records, electronic medical record, and images have been reviewed where available and summarized as:  MRI cervical spine 08/27/2014:  Multilevel advanced spondylosis causing spinal stenosis and foraminal stenosis at multiple levels. This is most severe on the right at C4-5.  Labs 07/2014:  ANA neg, CK 178*, HbA1c 6.4, Lyme neg, ESR 34, TSH 1.880, vitamin D 24.7*   Past Medical History  Diagnosis Date  . Diverticulosis   . Hiatal hernia   . Gastritis   . Migraine   . Depression   . Anxiety   . GERD (gastroesophageal reflux disease)   . Contact lens/glasses fitting     wears contacts or glasses  . Diabetes mellitus without complication   . Family history of adverse reaction to anesthesia     pts sister has severe N/V  . Panic attacks   . Hemorrhoid   . Neuropathy     Past Surgical History  Procedure Laterality Date  . Shoulder arthroscopy with rotator cuff repair  2010    right  . Carpal tunnel release  2002    right  . Tubal ligation    . Colonoscopy    . Upper gi endoscopy    . Trigger finger release Right 03/23/2013    Procedure: RELEASE TRIGGER FINGER/A-1 PULLEY RIGHT RING FINGER;  Surgeon: GWynonia Sours MD;  Location: [facility];  Service: Orthopedics;  Laterality: Right;  . Anterior cervical decomp/discectomy fusion N/A 09/12/2014    Procedure: Evacuation of cervical hematoma;  Surgeon: [name], MD;  Location: [facility];  Service: Neurosurgery;  Laterality: N/A;  . Anterior cervical decomp/discectomy fusion N/A 09/12/2014    Procedure: CERVICAL FOUR-FIVE, CERVICAL FIVE-SIX, CERVICAL SIX-SEVEN ANTERIOR CERVICAL DECOMPRESSION/DISCECTOMY FUSION;  Surgeon: [name], MD;  Location: [facility];  Service: Neurosurgery;  Laterality: N/A;      Medications:  Current Outpatient Prescriptions on File Prior to Visit  Medication Sig Dispense Refill  . ALPRAZolam (XANAX) 1 MG tablet Take 1 mg by mouth 2 (two) times daily as needed for anxiety.    Marland Kitchen aspirin 81 MG chewable tablet Chew by mouth daily.    Marland Kitchen b complex vitamins capsule Take 1 capsule by mouth every other day.     . Biotin 5 MG TABS Take 1 tablet by mouth every other day.     . DULoxetine (CYMBALTA) 60 MG capsule Take 120 mg by mouth daily.    Marland Kitchen gabapentin (NEURONTIN) 300 MG capsule Take one tablet by mouth in the morning and at lunch.  Then two by mouth at bedtime 120 capsule 1  . glucose blood (ONE TOUCH ULTRA TEST) test strip Use to check blood sugar daily.  Dx: 250.60 100 each 3  . lurasidone (LATUDA) 40 MG TABS tablet Take 60 mg by mouth daily with breakfast.     . meloxicam (MOBIC) 15 MG tablet Take 1 tablet by mouth  daily 90 tablet 1  . Multiple Vitamin (MULTIVITAMIN) tablet Take 1 tablet by mouth daily.    Glory Rosebush DELICA LANCETS 16X MISC Use to check blood sugar daily.  Dx: E11.49 100 each 3  . pantoprazole (PROTONIX) 40 MG tablet Take 1 tablet by mouth  daily 90 tablet 0  . Vitamin D, Ergocalciferol, (DRISDOL) 50000 UNITS CAPS capsule Take 1 capsule (50,000 Units total) by mouth every 7 (seven) days. 12 capsule 0   No current facility-administered medications on file prior to visit.    Allergies:  Allergies  Allergen Reactions  . Sulfonamide Derivatives     REACTION: Rash  . Hydrochlorothiazide Rash    Family History: Family History  Problem Relation Age of Onset  . Heart disease Brother   . Ovarian cancer Cousin   . Breast cancer Maternal Grandmother     grandmother  . Diabetes Maternal Aunt     aunt  . Diabetes Sister   . Anxiety disorder Sister   . Alcohol abuse Maternal Uncle     uncle  . Diabetes Paternal Aunt   . Alcohol abuse Paternal Uncle     Social History: History   Social History  . Marital Status: Single    Spouse  Name: N/A  . Number of Children: 1  . Years of Education: N/A   Occupational History  .  Lab [employer]   Social History Main Topics  . Smoking status: Never Smoker   . Smokeless tobacco: Never Used  . Alcohol Use: No  . Drug Use: No  . Sexual Activity: Not Currently   Other Topics Concern  . Not on file   Social History Narrative   Lives alone in a one story home.  Has one child.     Currently not working.     Worked for [employer].      Review of Systems:  CONSTITUTIONAL: No fevers, chills, night sweats, or weight loss.   EYES:  No visual changes or eye pain ENT: No hearing changes.  No history of nose bleeds.   RESPIRATORY: No cough, wheezing and shortness of breath.   CARDIOVASCULAR: Negative for chest pain, and palpitations.   GI: Negative for abdominal discomfort, blood in stools or black stools.  No recent change in bowel habits.   GU:  No history of incontinence.   MUSCLOSKELETAL: +history of joint pain or swelling.  +myalgias.   SKIN: Negative for lesions, rash, and itching.   HEMATOLOGY/ONCOLOGY: Negative for prolonged bleeding, bruising easily, and swollen nodes.  No history of cancer.   ENDOCRINE: Negative for cold or heat intolerance, polydipsia or goiter.   PSYCH:  ++depression or anxiety symptoms.   NEURO: As Above.   Vital Signs:  BP 160/90 mmHg  Pulse 105  Ht 5' 1.5" (1.562 m)  Wt 155 lb 9 oz (70.563 kg)  BMI 28.92 kg/m2  SpO2 96%   General Medical Exam:   General:  Anxious appearing, depressed.  Tearful at times. Eyes/ENT: see cranial nerve examination.   Neck: No masses appreciated.  Full range of motion without tenderness.  No carotid bruits. Respiratory:  Clear to auscultation, good air entry bilaterally.   Cardiac:  Regular rate and rhythm, no murmur.   Extremities:  No deformities, edema, or skin discoloration.  Skin:  No rashes or lesions.  Neurological Exam: MENTAL STATUS including orientation to time, place, person, recent and remote memory is  intact.  Attention span and concentration is fair.  Speech is not dysarthric.  Affect and mood is depressed.   CRANIAL NERVES: II:  No visual field defects.  Unremarkable fundi.   III-IV-VI: Pupils equal round and reactive to light.  Normal conjugate, extra-ocular eye movements in all directions of gaze.  No nystagmus.  No ptosis.   V:  Normal facial sensation.   VII:  Normal facial symmetry and movements.  No pathologic facial reflexes.  VIII:  Normal hearing and vestibular function.   IX-X:  Normal palatal movement.   XI:  Normal shoulder shrug and head rotation.   XII:  Normal tongue strength and range of motion, no deviation or fasciculation.  MOTOR: With repeated efforts and encouragement, motor strength testing is 5/5 in all extremities, proximally and distally.  No atrophy, fasciculations or abnormal movements.  No pronator drift.  Tone is normal.    MSRs:  Right                                                                 Left brachioradialis 2+  brachioradialis 2+  biceps 2+  biceps 2+  triceps 2+  triceps 2+  patellar 2+  patellar 2+  ankle jerk 2+  ankle jerk 2+  Hoffman no  Hoffman no  plantar response down  plantar response down   SENSORY:  Normal and symmetric perception of light touch, pinprick, vibration, and proprioception.  Romberg's sign absent.   COORDINATION/GAIT: Normal finger-to- nose-finger.  Slowed and inconsistent finger tapping and slow with finger-to-nose testing. Gait narrow based and stable. She is too unsteady with tandem gait.  Stressed gait intact.   IMPRESSION: Ms. Ambriz is a 59 year-old female referred for evaluation of worsening neuropathic pain, dizziness, weakenss, and whole body pain.  Her exam is normal, except for allodynia  with nearly every area that I palpated during motor strength testing and deep tendon reflex testing.  Pain is out of proportion than what is seen in neuropathy and more consistent with a chronic pain syndrome/fibromyalgia.   She has a significant amount of depression/anxiety also contributing to her overall feeling of unwell which she endorses.  She is tearful during the visit and has an appointment with her psychiatrist after my visit.  With a normal sensory exam, my suspicion for even diabetic neuropathy is low.  We can proceed with electrodiagnostic testing of the right arm and leg to better characterize the nature of her symptoms.  There has been mild elevation in her CK with the last being 178, but in darker pigmented individuals CK can be higher even at baseline.   We discussed the possibility an underlying pain syndrome such as fibromyalgia which is exacerbated by stress reaction. Pending results of EMG, she may benefit from pain management/rhuematology referral for fibromyalgia.  With her non-focal exam, low suspicion for anything intracranial causing her dizziness.  Depending on how her symptoms evolve over time, may consider brain imaging.   Return to clinic in 3 months   The duration of this appointment visit was 40 minutes of face-to-face time with the patient.  Greater than 50% of this time was spent in counseling, explanation of diagnosis, planning of further management, and coordination of care.   Thank you for allowing me to participate in patient's care.  If I can answer any additional questions, I would be pleased to do so.    Sincerely,    Donika K. Posey Pronto, DO

## 2015-03-19 NOTE — Patient Instructions (Signed)
1.  EMG of the right arm and leg 2.  Return to clinic in 3 months

## 2015-03-20 ENCOUNTER — Ambulatory Visit (INDEPENDENT_AMBULATORY_CARE_PROVIDER_SITE_OTHER): Payer: 59 | Admitting: Neurology

## 2015-03-20 DIAGNOSIS — R208 Other disturbances of skin sensation: Secondary | ICD-10-CM

## 2015-03-20 DIAGNOSIS — F329 Major depressive disorder, single episode, unspecified: Secondary | ICD-10-CM

## 2015-03-20 DIAGNOSIS — F32A Depression, unspecified: Secondary | ICD-10-CM

## 2015-03-20 DIAGNOSIS — R202 Paresthesia of skin: Secondary | ICD-10-CM

## 2015-03-20 DIAGNOSIS — R52 Pain, unspecified: Secondary | ICD-10-CM

## 2015-03-20 DIAGNOSIS — F411 Generalized anxiety disorder: Secondary | ICD-10-CM

## 2015-03-20 NOTE — Progress Notes (Signed)
Addendum  Electrodiagnostic testing of the right upper and lower extremity is entirely normal. There is no evidence of a generalized neuropathy or diffuse myopathy. As suspected, her symptoms are most likely due to underlying chronic pain syndrome or fibromyalgia and she may benefit from pain management as well as ongoing psychiatric care1.   Results have been discussed with the patient. She is reassured that there is no primary nerve or muscle disease.  Lisa K. Posey Pronto, DO

## 2015-03-20 NOTE — Procedures (Signed)
[facility]  [place], [place]  [address], [place] [address] Tel: [phone] Fax:  [phone] Test Date:  03/20/2015  Patient: Lisa Odom DOB: 1956/09/18 Physician: Narda Amber  Sex: Female Height: 5\' 1"  Ref Phys: Narda Amber  ID#: [id number] Temp: 34.0 Technician:    Patient Complaints: This is a 59 year-old female presenting for evaluation of whole body pain and paresthesias.  NCV & EMG Findings: Extensive electrodiagnostic testing of the right upper and lower extremity shows:  1. Right median, ulnar, radial, and palmar sensory responses are within normal limits.  2. Right median and ulnar motor responses are within normal limits.  3. Right sural and superficial peroneal sensory responses are within normal limits.  4. Right tibial and peroneal motor responses are within normal limits.  5. There is no evidence of active or chronic motor axon loss changes affecting any of the tested muscles. Motor unit configuration and recruitment pattern is within normal limits.  Impression: This is a normal study of the right upper and lower extremities.   In particular, there is no evidence of a generalized sensorimotor polyneuropathy, cervical/lumbosacral radiculopathy, diffuse myopathy, or carpal tunnel syndrome.   ___________________________ Narda Amber    Nerve Conduction Studies Anti Sensory Summary Table   Stim Site NR Peak (ms) Norm Peak (ms) P-T Amp (V) Norm P-T Amp  Right Median Anti Sensory (2nd Digit)  34C  Wrist    3.4 <3.8 27.0 >10  Right Radial Anti Sensory (Base 1st Digit)  34C  Wrist    2.0 <2.8 37.1 >10  Right Sup Peroneal Anti Sensory (Ant Lat Mall)  34C  12 cm    3.2 <4.6 16.3 >3  Right Sural Anti Sensory (Lat Mall)  34C  Calf    3.6 <4.6 12.3 >3  Right Ulnar Anti Sensory (5th Digit)  34C  Wrist    3.1 <3.2 22.1 >5   Motor Summary Table   Stim Site NR Onset (ms) Norm Onset (ms) O-P Amp (mV) Norm O-P Amp Site1 Site2 Delta-0 (ms)  Dist (cm) Vel (m/s) Norm Vel (m/s)  Right Median Motor (Abd Poll Brev)  34C  Wrist    3.9 <4.0 9.0 >5 Elbow Wrist 4.2 24.0 57 >50  Elbow    8.1  8.7         Right Peroneal Motor (Ext Dig Brev)  34C  Ankle    3.4 <6.0 3.3 >2.5 B Fib Ankle 7.0 31.0 44 >40  B Fib    10.4  3.0  Poplt B Fib 2.1 0.0  >40  Poplt    12.5  2.9         Right Tibial Motor (Abd Hall Brev)  34C  Ankle    3.3 <6.0 6.4 >4 Knee Ankle 8.7 38.0 44 >40  Knee    12.0  4.9         Right Ulnar Motor (Abd Dig Minimi)  34C  Wrist    2.8 <3.1 11.7 >7 B Elbow Wrist 3.2 18.0 56 >50  B Elbow    6.0  11.4  A Elbow B Elbow 1.4 10.0 71 >50  A Elbow    7.4  11.5          Comparison Summary Table   Stim Site NR Peak (ms) Norm Peak (ms) P-T Amp (V) Site1 Site2 Delta-P (ms) Norm Delta (ms)  Right Median/Ulnar Palm Comparison (Wrist - 8cm)  34C  Median Palm    2.0 <2.2 66.2 Median Palm Ulnar  Palm 0.3   Ulnar Palm    1.7 <2.2 13.4       EMG   Side Muscle Ins Act Fibs Psw Fasc Number Recrt Dur Dur. Amp Amp. Poly Poly. Comment  Right AntTibialis Nml Nml Nml Nml Nml Nml Nml Nml Nml Nml Nml Nml N/A  Right Gastroc Nml Nml Nml Nml Nml Nml Nml Nml Nml Nml Nml Nml N/A  Right Flex Dig Long Nml Nml Nml Nml Nml Nml Nml Nml Nml Nml Nml Nml N/A  Right RectFemoris Nml Nml Nml Nml Nml Nml Nml Nml Nml Nml Nml Nml N/A  Right GluteusMed Nml Nml Nml Nml Nml Nml Nml Nml Nml Nml Nml Nml N/A  Right 1stDorInt Nml Nml Nml Nml Nml Nml Nml Nml Nml Nml Nml Nml N/A  Right Ext Indicis Nml Nml Nml Nml Nml Nml Nml Nml Nml Nml Nml Nml N/A  Right PronatorTeres Nml Nml Nml Nml Nml Nml Nml Nml Nml Nml Nml Nml N/A  Right Biceps Nml Nml Nml Nml Nml Nml Nml Nml Nml Nml Nml Nml N/A  Right Triceps Nml Nml Nml Nml Nml Nml Nml Nml Nml Nml Nml Nml N/A  Right Deltoid Nml Nml Nml Nml Nml Nml Nml Nml Nml Nml Nml Nml N/A      Waveforms:

## 2015-04-17 ENCOUNTER — Encounter: Payer: Self-pay | Admitting: Neurology

## 2015-04-19 ENCOUNTER — Other Ambulatory Visit: Payer: Self-pay | Admitting: Family Medicine

## 2015-05-15 ENCOUNTER — Telehealth: Payer: Self-pay | Admitting: Family Medicine

## 2015-05-15 NOTE — Telephone Encounter (Signed)
Patient needs an order for blood work for a scheduled physical for 05/22/15

## 2015-05-15 NOTE — Telephone Encounter (Signed)
In outbox

## 2015-05-16 NOTE — Telephone Encounter (Addendum)
Spoke with Judson Roch.  She will come by office tomorrow to pick up lab orders.  She is requesting that her Vit D also be checked.  Ok to add to lab order?

## 2015-05-17 NOTE — Telephone Encounter (Signed)
Vit D added to order as instructed by Dr. Diona Browner.

## 2015-05-17 NOTE — Telephone Encounter (Signed)
Ok to add. Dx fatigue

## 2015-05-21 ENCOUNTER — Ambulatory Visit: Payer: Self-pay | Admitting: Neurology

## 2015-05-22 ENCOUNTER — Encounter: Payer: Self-pay | Admitting: Family Medicine

## 2015-05-22 ENCOUNTER — Ambulatory Visit (INDEPENDENT_AMBULATORY_CARE_PROVIDER_SITE_OTHER): Payer: 59 | Admitting: Family Medicine

## 2015-05-22 ENCOUNTER — Other Ambulatory Visit (HOSPITAL_COMMUNITY)
Admission: RE | Admit: 2015-05-22 | Discharge: 2015-05-22 | Disposition: A | Discharge status: Home / Self Care | Payer: 59 | Source: Ambulatory Visit | Attending: Family Medicine | Admitting: Family Medicine

## 2015-05-22 VITALS — BP 110/70 | HR 103 | Temp 97.9°F | Ht 61.5 in | Wt 151.5 lb

## 2015-05-22 DIAGNOSIS — E7800 Pure hypercholesterolemia, unspecified: Secondary | ICD-10-CM

## 2015-05-22 DIAGNOSIS — E1142 Type 2 diabetes mellitus with diabetic polyneuropathy: Secondary | ICD-10-CM

## 2015-05-22 DIAGNOSIS — I10 Essential (primary) hypertension: Secondary | ICD-10-CM | POA: Diagnosis not present

## 2015-05-22 DIAGNOSIS — Z23 Encounter for immunization: Secondary | ICD-10-CM | POA: Diagnosis not present

## 2015-05-22 DIAGNOSIS — Z0000 Encounter for general adult medical examination without abnormal findings: Secondary | ICD-10-CM | POA: Diagnosis not present

## 2015-05-22 DIAGNOSIS — Z1151 Encounter for screening for human papillomavirus (HPV): Secondary | ICD-10-CM | POA: Insufficient documentation

## 2015-05-22 DIAGNOSIS — Z01419 Encounter for gynecological examination (general) (routine) without abnormal findings: Secondary | ICD-10-CM | POA: Insufficient documentation

## 2015-05-22 DIAGNOSIS — E348 Other specified endocrine disorders: Secondary | ICD-10-CM | POA: Diagnosis not present

## 2015-05-22 DIAGNOSIS — M797 Fibromyalgia: Secondary | ICD-10-CM | POA: Insufficient documentation

## 2015-05-22 DIAGNOSIS — Z124 Encounter for screening for malignant neoplasm of cervix: Secondary | ICD-10-CM

## 2015-05-22 LAB — HM DIABETES FOOT EXAM

## 2015-05-22 MED ORDER — PREGABALIN 75 MG PO CAPS: ORAL_CAPSULE | ORAL | Status: DC

## 2015-05-22 NOTE — Patient Instructions (Addendum)
Stop gabapentin. Trial of lyrica as discussed.  Work on increasing exercise, continue working on [employer]. Stop at front desk for referral to bone density.

## 2015-05-22 NOTE — Progress Notes (Signed)
Pre visit review using our clinic review tool, if applicable. No additional management support is needed unless otherwise documented below in the visit note. 

## 2015-05-22 NOTE — Assessment & Plan Note (Signed)
Will stop gabapentin and start lyrica trial. Pt already on antidepressants including cymbalta. If not improving refer to rheum.

## 2015-05-22 NOTE — Addendum Note (Signed)
Addended by: Carter Kitten on: 05/22/2015 11:16 AM   Modules accepted: Orders

## 2015-05-22 NOTE — Assessment & Plan Note (Signed)
Well controlleld on no med.

## 2015-05-22 NOTE — Assessment & Plan Note (Signed)
Excellent control.   

## 2015-05-22 NOTE — Progress Notes (Signed)
The patient is here for annual wellness exam and preventative care.    Elevated Cholesterol: Tot 182, tri 82, HDL 48, LDL 118 LDL not at goal < 100 on no med [facility] labs reviewed in detail.  Diet compliance:moderate Exercise:None Other complaints:   Wt Readings from Last 3 Encounters:  05/22/15 151 lb 8 oz (68.72 kg)  03/19/15 155 lb 9 oz (70.563 kg)  02/06/15 151 lb 4 oz (68.607 kg)    Diabetes:   At goal A1C 6.1 on no med. Lab Results  Component Value Date   HGBA1C 6.4* 07/31/2014  Using medications without difficulties:None Hypoglycemic episodes:none Hyperglycemic episodes:none Feet problems:no ulcer Blood Sugars averaging:  FBS 90-106 eye exam within last year:in last year   BP Readings from Last 3 Encounters:  05/22/15 110/70  03/19/15 160/90  02/06/15 118/72    Depression, mood issues: treated by psych on multiple meds.  Parasthesia and whole body pain likely fibromyalgia: in eval with Dr. Posey Pronto. Electrodiagnostic testing nml.  There has been mild elevation in her CK with the last being 178, but in darker pigmented individuals CK can be higher even at baseline.  Neuro rec consideration of referral to rheum or pain center for fibro possibility.  On gabapentin for neuropathic pain. She is already on cymbalta. SE to amitriptyline in apst.  She has poor control of pain. She has not been able to return to work. Has ben out 6 months.  Migraines.. well controlled on topamax 100 daily.Marland Kitchen occuring once a month. Using amerge once a month.   Diagnosed with spastic colon by Dr. Olevia Perches: bentyl helps with symptoms.   Review of Systems  Constitutional: Negative for fever, fatigue and unexpected weight change.  HENT: Negative for ear pain, congestion, sore throat, sneezing, trouble swallowing and sinus pressure.  Eyes: Negative for pain and itching.  Respiratory: Negative for cough, shortness of breath and wheezing.  Cardiovascular: Negative for chest pain, palpitations  and leg swelling.  Gastrointestinal: Negative for nausea, abdominal pain, diarrhea, constipation and blood in stool.  Genitourinary: Negative for dysuria, hematuria, vaginal discharge, difficulty urinating and menstrual problem.  Skin: Negative for rash.  Neurological: Negative for syncope, weakness, light-headedness, numbness and headaches.  Psychiatric/Behavioral: Positive for behavioral problems, sleep disturbance and decreased concentration. Negative for confusion and dysphoric mood. The patient is not nervous/anxious.       Objective:   Physical Exam  Constitutional: Vital signs are normal. She appears well-developed and well-nourished. She is cooperative. Non-toxic appearance. She does not appear ill. No distress.  HENT:  Head: Normocephalic.  Right Ear: Hearing, tympanic membrane, external ear and ear canal normal.  Left Ear: Hearing, tympanic membrane, external ear and ear canal normal.  Nose: Nose normal.  Eyes: Conjunctivae, EOM and lids are normal. Pupils are equal, round, and reactive to light. No foreign bodies found.  Neck: Trachea normal and normal range of motion. Neck supple. Carotid bruit is not present. No mass and no thyromegaly present.  Cardiovascular: Normal rate, regular rhythm, S1 normal, S2 normal, normal heart sounds and intact distal pulses. Exam reveals no gallop.  No murmur heard. Pulmonary/Chest: Effort normal and breath sounds normal. No respiratory distress. She has no wheezes. She has no rhonchi. She has no rales.  Abdominal: Soft. Normal appearance and bowel sounds are normal. She exhibits no distension, no fluid wave, no abdominal bruit and no mass. There is no hepatosplenomegaly. There is no tenderness. There is no rebound, no guarding and no CVA tenderness. No hernia.  Genitourinary:  Vagina normal and uterus normal. No breast swelling, tenderness, discharge or bleeding. Pelvic exam was performed with patient prone. There is no rash, tenderness or  lesion on the right labia. There is no rash, tenderness or lesion on the left labia. Uterus is not enlarged and not tender. Right adnexum displays no mass, no tenderness and no fullness. Left adnexum displays no mass, no tenderness and no fullness.  Musculoskeletal:  Tenderness in 18/18 trigger points.  Cervical back: She exhibits normal range of motion, no tenderness and no bony tenderness.  Lymphadenopathy:   She has no cervical adenopathy.   She has no axillary adenopathy.  Neurological: She is alert. She has normal strength and normal reflexes. She displays no atrophy and no tremor. No cranial nerve deficit or sensory deficit. She exhibits normal muscle tone. She displays a negative Romberg sign. Coordination and gait normal.  Skin: Skin is warm, dry and intact. No rash noted.  Psychiatric: Her speech is normal. Judgment normal. Her mood appears not anxious. Her affect is inappropriate. She is slowed and withdrawn. Cognition and memory are impaired. She exhibits a depressed mood.  She expresses no homicidal ideation. She expresses no suicidal plans and no homicidal plans.  Diabetic foot exam: Normal inspection No skin breakdown No calluses  Normal DP pulses Normal sensation to light touch and monofilament Nails normal       Assessment & Plan:  The patient's preventative maintenance and recommended screening tests for an annual wellness exam were reviewed in full today. Brought up to date unless services declined.  Counselled on the importance of diet, exercise, and its role in overall health and mortality. The patient's FH and SH was reviewed, including their home life, tobacco status, and drug and alcohol status.   Vaccines:uptodate with Td, flu Nonsmoker Colon: 10/19/12 Olevia Perches, repeat in 2024 PAP/DVE: last pap 2012, DVE this year,  on q3 year schedule, due for pap DEXA: schedule. Mammo:  Scheduled in 2 days.

## 2015-05-22 NOTE — Assessment & Plan Note (Signed)
LDL not at goal. Now she is living with sister. Diet is going to be much improved.  Encouraged exercise, weight loss, healthy eating habits.

## 2015-05-24 ENCOUNTER — Encounter: Payer: Self-pay | Admitting: Family Medicine

## 2015-05-24 LAB — CYTOLOGY - PAP

## 2015-05-25 ENCOUNTER — Encounter: Payer: Self-pay | Admitting: *Deleted

## 2015-05-29 ENCOUNTER — Encounter: Payer: Self-pay | Admitting: Family Medicine

## 2015-05-29 DIAGNOSIS — M810 Age-related osteoporosis without current pathological fracture: Secondary | ICD-10-CM | POA: Insufficient documentation

## 2015-06-22 ENCOUNTER — Other Ambulatory Visit: Payer: Self-pay | Admitting: Podiatry

## 2015-06-22 ENCOUNTER — Telehealth: Payer: Self-pay | Admitting: *Deleted

## 2015-06-22 ENCOUNTER — Other Ambulatory Visit: Payer: Self-pay | Admitting: Family Medicine

## 2015-06-22 NOTE — Telephone Encounter (Signed)
Pt must be seen by Dr. Milinda Pointer, prior to future refills.

## 2015-06-22 NOTE — Telephone Encounter (Signed)
Gabapentin refilled, and contacted pt to make an appt prior to future refill.  Pt states understanding.

## 2015-06-22 NOTE — Telephone Encounter (Signed)
Last office visit 05/22/2015.  Last refilled 12/25/2014 for #90 with 1 refill.  Ok to refill?

## 2015-08-13 ENCOUNTER — Ambulatory Visit (INDEPENDENT_AMBULATORY_CARE_PROVIDER_SITE_OTHER): Payer: 59 | Admitting: Family Medicine

## 2015-08-13 VITALS — BP 130/84 | HR 100 | Temp 97.7°F | Resp 18 | Ht 61.0 in | Wt 149.8 lb

## 2015-08-13 DIAGNOSIS — F329 Major depressive disorder, single episode, unspecified: Secondary | ICD-10-CM | POA: Diagnosis not present

## 2015-08-13 DIAGNOSIS — G894 Chronic pain syndrome: Secondary | ICD-10-CM

## 2015-08-13 DIAGNOSIS — F32A Depression, unspecified: Secondary | ICD-10-CM

## 2015-08-13 MED ORDER — MELOXICAM 15 MG PO TABS: 15.0000 mg | ORAL_TABLET | Freq: Every day | ORAL | Status: DC

## 2015-08-13 MED ORDER — PREGABALIN 75 MG PO CAPS: ORAL_CAPSULE | ORAL | Status: DC

## 2015-08-13 MED ORDER — DULOXETINE HCL 60 MG PO CPEP: 120.0000 mg | ORAL_CAPSULE | Freq: Every day | ORAL | Status: DC

## 2015-08-13 MED ORDER — PANTOPRAZOLE SODIUM 40 MG PO TBEC: 40.0000 mg | DELAYED_RELEASE_TABLET | Freq: Every day | ORAL | Status: DC

## 2015-08-13 NOTE — Progress Notes (Signed)
Patient ID: Lisa Odom, female    DOB: 1956-01-14  Age: 59 y.o. MRN: [id number]  Chief Complaint  Patient presents with  . OTHER    Meds refilll  . Depression    Subjective:   Patient is here for the first time. She has a long history of chronic pain. She has fibromyalgia. She has had a hip injury. She has a long history of severe depression. She is seen a psychiatrist, whom she still sees on a regular basis. She also has seen a primary care doctor. She got some new insurance and she cannot see that doctor. She needs a number of medicines refilled. The insurance company told her to come here and see Dr. Joseph Art as her primary care. She came here and finds that he is not taking new patients, that he will be retiring soon. She has had suicidal thoughts in the past, does not dwell on it and knows that if she gets to bed she needs to go immediately for help.  Medicine list reviewed and discussed. She has problems with feeling like her thought process is slow and fuzzy. We discussed how medications can do that.  Current allergies, medications, problem list, past/family and social histories reviewed.  Objective:  BP 130/84 mmHg  Pulse 100  Temp(Src) 97.7 F (36.5 C) (Oral)  Resp 18  Ht 5\' 1"  (1.549 m)  Wt 149 lb 12.8 oz (67.949 kg)  BMI 28.32 kg/m2  SpO2 94%  Did not examine her. The whole 30 minute visit was spent in counseling.  Assessment & Plan:   Assessment: 1. Chronic pain syndrome   2. Depression       Plan: Advised her to try a little down her medications the best she can, that that might clear her brain. Encouraged her to continue to stay active in life, with family and friends and [facility]    Meds ordered this encounter  Medications  . pregabalin (LYRICA) 75 MG capsule    Sig: 1 tab po BID x 1 week, if tolerating and not better increase to 2 tabs po twice daily.    Dispense:  180 capsule    Refill:  1  . DULoxetine (CYMBALTA) 60 MG capsule    Sig: Take 2 capsules  (120 mg total) by mouth daily.    Dispense:  180 capsule    Refill:  1  . pantoprazole (PROTONIX) 40 MG tablet    Sig: Take 1 tablet (40 mg total) by mouth daily.    Dispense:  90 tablet    Refill:  1  . meloxicam (MOBIC) 15 MG tablet    Sig: Take 1 tablet (15 mg total) by mouth daily.    Dispense:  90 tablet    Refill:  1         Patient Instructions  Continue current medications  You need to find yourself for a regular physician.  Stay as active as you possibly can despite pain  With time try to decrease medications if possible  If you find yourself getting to severely depressed and having excessive suicidal thinking seek help immediately  Return as needed     Return if symptoms worsen or fail to improve.   HOPPER,DAVID, MD 08/13/2015

## 2015-08-13 NOTE — Patient Instructions (Signed)
Continue current medications  You need to find yourself for a regular physician.  Stay as active as you possibly can despite pain  With time try to decrease medications if possible  If you find yourself getting to severely depressed and having excessive suicidal thinking seek help immediately  Return as needed

## 2015-08-22 ENCOUNTER — Telehealth: Payer: Self-pay

## 2015-08-22 NOTE — Telephone Encounter (Signed)
PA completed for duloxetine on covermymeds. Pending. Started PA for Lyrica but need to ask pt which meds she has tried in the past for fibromyalgia/chronic pain from the following list: Gabapentin (generic Neurontin), Savella, Venlafaxine (generic Effexor, Effexor XR), Duloxetine (generic Cymbalta), One (1) Tricyclic antidepressant (for example, amitriptyline, clomipramine, desipramine, doxepin, imipramine, nortriptyline, protriptyline, trimipramine)? LMOM to CB.

## 2015-08-27 NOTE — Telephone Encounter (Signed)
PA approved for duloxetine through 08/21/16. Notified pharm.

## 2015-10-29 ENCOUNTER — Encounter: Payer: Self-pay | Admitting: Family Medicine

## 2015-10-29 ENCOUNTER — Ambulatory Visit (INDEPENDENT_AMBULATORY_CARE_PROVIDER_SITE_OTHER): Payer: BLUE CROSS/BLUE SHIELD | Admitting: Family Medicine

## 2015-10-29 VITALS — BP 130/70 | HR 92 | Temp 98.0°F | Ht 61.0 in | Wt 151.5 lb

## 2015-10-29 DIAGNOSIS — M79604 Pain in right leg: Secondary | ICD-10-CM | POA: Diagnosis not present

## 2015-10-29 DIAGNOSIS — M797 Fibromyalgia: Secondary | ICD-10-CM | POA: Diagnosis not present

## 2015-10-29 DIAGNOSIS — E1140 Type 2 diabetes mellitus with diabetic neuropathy, unspecified: Secondary | ICD-10-CM

## 2015-10-29 DIAGNOSIS — M79605 Pain in left leg: Secondary | ICD-10-CM | POA: Diagnosis not present

## 2015-10-29 MED ORDER — NORTRIPTYLINE HCL 25 MG PO CAPS: 25.0000 mg | ORAL_CAPSULE | Freq: Every day | ORAL | Status: DC

## 2015-10-29 MED ORDER — CELECOXIB 200 MG PO CAPS: 200.0000 mg | ORAL_CAPSULE | Freq: Every day | ORAL | Status: DC

## 2015-10-29 NOTE — Patient Instructions (Addendum)
Start trial of nortriptyline at bedtime for sleep and fibromyalgia.  Stop meloxicam, OTC ibuprofen, aleve. Start celebrex trial 1 tab daily.  Follow up for DM follow up in next month with labs  prior.

## 2015-10-29 NOTE — Assessment & Plan Note (Signed)
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  She is on gabapentin and cymbalta high dose.  Will add nortriptyline at bedtime and try celebrex given other NSAIDs have not been helpful.  Close follow up in 1 month.

## 2015-10-29 NOTE — Assessment & Plan Note (Signed)
Improved with gabapentin, but body pain not changed.

## 2015-10-29 NOTE — Progress Notes (Signed)
Pre visit review using our clinic review tool, if applicable. No additional management support is needed unless otherwise documented below in the visit note. 

## 2015-10-29 NOTE — Progress Notes (Signed)
Subjective:    Patient ID: Lisa Odom, female    DOB: 10-07-1955, 60 y.o.   MRN: [id number]  HPI  60 year old female wiht history of severe depression, recurrent, hx of parasthesia, whole body pain DM,  Spinal stenosis of cervical spine, carpal tunnel ( S/P surgery) presents  Pain all over. This is not new.   Low back pain  Bilateral shoulder pain  Neck pain. Feels similar to pain she was having prior to the surgery.  Bilateral thumb joints painful, occ turning blue over thenar eminence. Not occuring with change in temp. Whole body hurts  Tried tylenol arthritis, helps none.  Meloxicam helps minimmaly with pain  Saw neuro for allodynia, parasthesia and whole body pain. Felt low concenr for DM neuropathy given presentation.  PER NEURO: Electrodiagnostic testing of the right upper and lower extremity is entirely normal. There is no evidence of a generalized neuropathy or diffuse myopathy. As suspected, her symptoms are most likely due to underlying chronic pain syndrome or fibromyalgia and she may benefit from pain management as well as ongoing psychiatric care1.   She tried lyrica 75 mg twice daily.Hanley Seamen her SE. Had to stop.  Restarted gabapentin, helps some. 300 mg, 300 mg and 1200 mg at bedtime.  Has helped with neuropathy, but not really with all body pain.   Depression remains poorly controlled , seeing psych. On cymbalta 120 mg daily.  Social History /Family History/Past Medical History reviewed and updated if needed. 08/2014  cervical spinal stenosis surgery, only minimal improvement of symptoms  Strong family history of rheumatoid arthritis.  Review of Systems  Constitutional: Negative for fever and fatigue.  HENT: Negative for ear pain.   Eyes: Negative for pain.  Respiratory: Negative for chest tightness and shortness of breath.   Cardiovascular: Negative for chest pain, palpitations and leg swelling.  Gastrointestinal: Negative for abdominal pain.  Genitourinary:  Negative for dysuria.       Objective:   Physical Exam  Constitutional: Vital signs are normal. She appears well-developed and well-nourished. She is cooperative.  Non-toxic appearance. She does not appear ill. No distress.  Fatigued appearing, almost slurred speech  HENT:  Head: Normocephalic.  Right Ear: Hearing, tympanic membrane, external ear and ear canal normal. Tympanic membrane is not erythematous, not retracted and not bulging.  Left Ear: Hearing, tympanic membrane, external ear and ear canal normal. Tympanic membrane is not erythematous, not retracted and not bulging.  Nose: No mucosal edema or rhinorrhea. Right sinus exhibits no maxillary sinus tenderness and no frontal sinus tenderness. Left sinus exhibits no maxillary sinus tenderness and no frontal sinus tenderness.  Mouth/Throat: Uvula is midline, oropharynx is clear and moist and mucous membranes are normal.  Eyes: Conjunctivae, EOM and lids are normal. Pupils are equal, round, and reactive to light. Lids are everted and swept, no foreign bodies found.  Neck: Trachea normal and normal range of motion. Neck supple. Carotid bruit is not present. No thyroid mass and no thyromegaly present.  Cardiovascular: Normal rate, regular rhythm, S1 normal, S2 normal, normal heart sounds, intact distal pulses and normal pulses.  Exam reveals no gallop and no friction rub.   No murmur heard. Pulmonary/Chest: Effort normal and breath sounds normal. No tachypnea. No respiratory distress. She has no decreased breath sounds. She has no wheezes. She has no rhonchi. She has no rales.  Abdominal: Soft. Normal appearance and bowel sounds are normal. There is no tenderness.  Musculoskeletal:       Cervical back:  She exhibits decreased range of motion, tenderness and bony tenderness.       Lumbar back: She exhibits decreased range of motion, tenderness and bony tenderness.       Right hand: She exhibits decreased range of motion and tenderness.        Left hand: She exhibits decreased range of motion.  18 trigger points tender  Bilateral 1st CMC joint pain and tenderness  Neurological: She is alert. She has normal strength. No cranial nerve deficit or sensory deficit. She displays a negative Romberg sign.  Trigger points 18/18  Skin: Skin is warm, dry and intact. No rash noted.  Psychiatric: Her speech is normal and behavior is normal. Judgment and thought content normal. Her mood appears not anxious. Cognition and memory are normal. She does not exhibit a depressed mood.          Assessment & Plan:

## 2015-10-29 NOTE — Assessment & Plan Note (Signed)
Pt will return for labs and DM follow up.

## 2015-12-02 ENCOUNTER — Telehealth: Payer: Self-pay | Admitting: Family Medicine

## 2015-12-02 DIAGNOSIS — E7800 Pure hypercholesterolemia, unspecified: Secondary | ICD-10-CM

## 2015-12-02 DIAGNOSIS — M8580 Other specified disorders of bone density and structure, unspecified site: Secondary | ICD-10-CM

## 2015-12-02 DIAGNOSIS — E1142 Type 2 diabetes mellitus with diabetic polyneuropathy: Secondary | ICD-10-CM

## 2015-12-02 DIAGNOSIS — Z1159 Encounter for screening for other viral diseases: Secondary | ICD-10-CM

## 2015-12-02 NOTE — Telephone Encounter (Signed)
-----   Message from Marchia Bond sent at 11/23/2015  1:48 PM EST ----- Regarding: 6 mo f/u labs Mon 3/6, need orders. Thanks! :-) Please order future 6 mo f/u labs for pt's upcoming lab appt. Thanks Aniceto Boss

## 2015-12-03 ENCOUNTER — Other Ambulatory Visit: Payer: Self-pay | Admitting: Family Medicine

## 2015-12-03 ENCOUNTER — Other Ambulatory Visit (INDEPENDENT_AMBULATORY_CARE_PROVIDER_SITE_OTHER): Payer: BLUE CROSS/BLUE SHIELD

## 2015-12-03 DIAGNOSIS — M8580 Other specified disorders of bone density and structure, unspecified site: Secondary | ICD-10-CM

## 2015-12-03 DIAGNOSIS — E1142 Type 2 diabetes mellitus with diabetic polyneuropathy: Secondary | ICD-10-CM

## 2015-12-03 DIAGNOSIS — E7800 Pure hypercholesterolemia, unspecified: Secondary | ICD-10-CM | POA: Diagnosis not present

## 2015-12-03 DIAGNOSIS — Z1159 Encounter for screening for other viral diseases: Secondary | ICD-10-CM

## 2015-12-03 LAB — COMPREHENSIVE METABOLIC PANEL
ALBUMIN: 4.3 g/dL (ref 3.5–5.2)
ALT: 18 U/L (ref 0–35)
AST: 17 U/L (ref 0–37)
Alkaline Phosphatase: 87 U/L (ref 39–117)
BUN: 14 mg/dL (ref 6–23)
CHLORIDE: 101 meq/L (ref 96–112)
CO2: 29 mEq/L (ref 19–32)
Calcium: 9.4 mg/dL (ref 8.4–10.5)
Creatinine, Ser: 0.77 mg/dL (ref 0.40–1.20)
GFR: 97.68 mL/min (ref >60.00)
Glucose, Bld: 99 mg/dL (ref 70–99)
POTASSIUM: 3.7 meq/L (ref 3.5–5.1)
SODIUM: 136 meq/L (ref 135–145)
Total Bilirubin: 0.6 mg/dL (ref 0.2–1.2)
Total Protein: 7.4 g/dL (ref 6.0–8.3)

## 2015-12-03 LAB — LIPID PANEL
CHOLESTEROL: 196 mg/dL (ref 0–200)
HDL: 50.6 mg/dL (ref >39.00)
LDL CALC: 122 mg/dL — AB (ref 0–99)
NonHDL: 145.11
Total CHOL/HDL Ratio: 4
Triglycerides: 117 mg/dL (ref 0.0–149.0)
VLDL: 23.4 mg/dL (ref 0.0–40.0)

## 2015-12-03 LAB — HEMOGLOBIN A1C: HEMOGLOBIN A1C: 6.3 % (ref 4.6–6.5)

## 2015-12-03 LAB — VITAMIN D 25 HYDROXY (VIT D DEFICIENCY, FRACTURES): VITD: 24.77 ng/mL — ABNORMAL LOW (ref 30.00–100.00)

## 2015-12-04 LAB — HEPATITIS C ANTIBODY: HCV AB: NEGATIVE

## 2015-12-14 ENCOUNTER — Ambulatory Visit: Payer: BLUE CROSS/BLUE SHIELD | Admitting: Family Medicine

## 2015-12-20 ENCOUNTER — Ambulatory Visit (INDEPENDENT_AMBULATORY_CARE_PROVIDER_SITE_OTHER): Payer: BLUE CROSS/BLUE SHIELD | Admitting: Family Medicine

## 2015-12-20 ENCOUNTER — Encounter: Payer: Self-pay | Admitting: Family Medicine

## 2015-12-20 VITALS — BP 120/70 | HR 101 | Temp 97.8°F | Ht 61.0 in | Wt 146.8 lb

## 2015-12-20 DIAGNOSIS — E7800 Pure hypercholesterolemia, unspecified: Secondary | ICD-10-CM

## 2015-12-20 DIAGNOSIS — M797 Fibromyalgia: Secondary | ICD-10-CM

## 2015-12-20 DIAGNOSIS — I10 Essential (primary) hypertension: Secondary | ICD-10-CM | POA: Diagnosis not present

## 2015-12-20 DIAGNOSIS — M25552 Pain in left hip: Secondary | ICD-10-CM | POA: Insufficient documentation

## 2015-12-20 DIAGNOSIS — E1140 Type 2 diabetes mellitus with diabetic neuropathy, unspecified: Secondary | ICD-10-CM | POA: Diagnosis not present

## 2015-12-20 DIAGNOSIS — R413 Other amnesia: Secondary | ICD-10-CM

## 2015-12-20 LAB — HM DIABETES EYE EXAM

## 2015-12-20 LAB — HM DIABETES FOOT EXAM

## 2015-12-20 MED ORDER — NORTRIPTYLINE HCL 25 MG PO CAPS: 25.0000 mg | ORAL_CAPSULE | Freq: Every day | ORAL | Status: DC

## 2015-12-20 NOTE — Progress Notes (Addendum)
Subjective:    Patient ID: Lisa Odom, female    DOB: 08-08-1956, 60 y.o.   MRN: [id number]  HPI  60 year old female presents for follow up DM and fibromyalgia.   Diabetes:   Well controlled on no medication. Lab Results  Component Value Date   HGBA1C 6.3 12/03/2015  Using medications without difficulties: Hypoglycemic episodes:none Hyperglycemic episodes:none Feet problems:None Blood Sugars averaging:not checking eye exam within last year: today  Elevated Cholesterol: LDL not at goal < 100. On no medication. Using medications without problems: Muscle aches:  Diet compliance: Moderate Exercise: boot camp, but cannot go at times give pain. Other complaints:   Hypertension:   Well controlled on no medication. BP Readings from Last 3 Encounters:  12/20/15 120/70  10/29/15 130/70  08/13/15 130/84  Using medication without problems or lightheadedness:  Chest pain with exertion: Edema: Short of breath: Average home BPs: Other issues:    Fibromyalgia: on cymbalta and gabapentin high dose.  At last OV she was started on nortriptyline at bedtime and meloxicam was changed to celebrex. Celbrex helped her move more but has not been on ? Reason.  She has been sleeping better at night, improved mood and wellbeing some but not much.  No benifit in fibromyalgia on current dose of nortriptyline.  Left anterior hip pain in last week.  No recent injury.  Stable low back pain.  No new numbness, no new weakness.  Tylenol helps some.  Sister feels that ever since the cervical decompression and post op hematoma, compromising respiration.. She has had more confusion and memory loss.  Her sister ( she now lives with her) states multiple issues.. forgetting where she is in house, how to roll down window in car etc.?, debit card number. Not clearing worsening.  She asa 81 mg daily, was not on at surgery.   Review of Systems  Constitutional: Negative for fever and fatigue.  HENT:  Negative for ear pain.   Eyes: Negative for pain.  Respiratory: Negative for chest tightness and shortness of breath.   Cardiovascular: Negative for chest pain, palpitations and leg swelling.  Gastrointestinal: Negative for abdominal pain.  Genitourinary: Negative for dysuria.       Objective:   Physical Exam  Constitutional: Vital signs are normal. She appears well-developed and well-nourished. She is cooperative.  Non-toxic appearance. She does not appear ill. No distress.  HENT:  Head: Normocephalic.  Right Ear: Hearing, tympanic membrane, external ear and ear canal normal. Tympanic membrane is not erythematous, not retracted and not bulging.  Left Ear: Hearing, tympanic membrane, external ear and ear canal normal. Tympanic membrane is not erythematous, not retracted and not bulging.  Nose: No mucosal edema or rhinorrhea. Right sinus exhibits no maxillary sinus tenderness and no frontal sinus tenderness. Left sinus exhibits no maxillary sinus tenderness and no frontal sinus tenderness.  Mouth/Throat: Uvula is midline, oropharynx is clear and moist and mucous membranes are normal.  Eyes: Conjunctivae, EOM and lids are normal. Pupils are equal, round, and reactive to light. Lids are everted and swept, no foreign bodies found.  Neck: Trachea normal and normal range of motion. Neck supple. Carotid bruit is not present. No thyroid mass and no thyromegaly present.  Cardiovascular: Normal rate, regular rhythm, S1 normal, S2 normal, normal heart sounds, intact distal pulses and normal pulses.  Exam reveals no gallop and no friction rub.   No murmur heard. Pulmonary/Chest: Effort normal and breath sounds normal. No tachypnea. No respiratory distress. She has no decreased  breath sounds. She has no wheezes. She has no rhonchi. She has no rales.  Abdominal: Soft. Normal appearance and bowel sounds are normal. There is no tenderness.  Musculoskeletal:       Left hip: She exhibits decreased range of  motion and tenderness. She exhibits normal strength.  Pos faber's, neg SLR  14/18 trigger points, less tender in chest wall than previous.  Neurological: She is alert.  Skin: Skin is warm, dry and intact. No rash noted.  Psychiatric: Her speech is normal and behavior is normal. Judgment and thought content normal. Her mood appears not anxious. Cognition and memory are normal. She does not exhibit a depressed mood.   Diabetic foot exam: Normal inspection No skin breakdown No calluses  Normal DP pulses Normal sensation to light touch and monofilament Nails normal         Assessment & Plan:

## 2015-12-20 NOTE — Assessment & Plan Note (Signed)
Trial of increase dose of nortriptyline.

## 2015-12-20 NOTE — Progress Notes (Signed)
Pre visit review using our clinic review tool, if applicable. No additional management support is needed unless otherwise documented below in the visit note. 

## 2015-12-20 NOTE — Assessment & Plan Note (Signed)
Likely bursitis vs arthritis.  Restart celebrex. If not improving will X-ray.

## 2015-12-20 NOTE — Assessment & Plan Note (Signed)
Well controlled 

## 2015-12-20 NOTE — Assessment & Plan Note (Signed)
UNclaar if issue before surgery, ? Surgical CAV, hypoxia and brain injury, ? Due to early alzheimer's ( no family history), med SE, depression se.  Offered mri to eval for past CVA.Marland Kitchen Pt refused. Will keep on asa nas control chol to try to decrease CVA risk.  At next OV.Marland Kitchen Check MMSE to follow over time.  pt refuses med such as aricept at this time.

## 2015-12-20 NOTE — Assessment & Plan Note (Signed)
LDL not at goal. Discussed consideration of low dose statin ( concerned given muscle pain SE, versus starting welchol.

## 2015-12-20 NOTE — Assessment & Plan Note (Signed)
Well controlled with lifestyle. Encouraged exercise, weight loss, healthy eating habits.

## 2015-12-20 NOTE — Patient Instructions (Addendum)
Try increased dose of nortriptyline at bedtime. 2 tabs at night.  Restart celebrex. Work on low cholesterol diet.

## 2015-12-28 ENCOUNTER — Encounter: Payer: Self-pay | Admitting: Family Medicine

## 2016-01-25 ENCOUNTER — Emergency Department (HOSPITAL_COMMUNITY)
Admission: EM | Admit: 2016-01-25 | Discharge: 2016-01-25 | Disposition: A | Discharge status: Home / Self Care | Payer: BLUE CROSS/BLUE SHIELD | Attending: Emergency Medicine | Admitting: Emergency Medicine

## 2016-01-25 ENCOUNTER — Telehealth: Payer: Self-pay | Admitting: Family Medicine

## 2016-01-25 ENCOUNTER — Encounter (HOSPITAL_COMMUNITY): Payer: Self-pay | Admitting: Emergency Medicine

## 2016-01-25 DIAGNOSIS — G43909 Migraine, unspecified, not intractable, without status migrainosus: Secondary | ICD-10-CM | POA: Insufficient documentation

## 2016-01-25 DIAGNOSIS — M5416 Radiculopathy, lumbar region: Secondary | ICD-10-CM | POA: Diagnosis not present

## 2016-01-25 DIAGNOSIS — F410 Panic disorder [episodic paroxysmal anxiety] without agoraphobia: Secondary | ICD-10-CM | POA: Insufficient documentation

## 2016-01-25 DIAGNOSIS — E119 Type 2 diabetes mellitus without complications: Secondary | ICD-10-CM | POA: Insufficient documentation

## 2016-01-25 DIAGNOSIS — F329 Major depressive disorder, single episode, unspecified: Secondary | ICD-10-CM | POA: Insufficient documentation

## 2016-01-25 DIAGNOSIS — M545 Low back pain: Secondary | ICD-10-CM | POA: Diagnosis present

## 2016-01-25 DIAGNOSIS — Z79899 Other long term (current) drug therapy: Secondary | ICD-10-CM | POA: Diagnosis not present

## 2016-01-25 DIAGNOSIS — Z973 Presence of spectacles and contact lenses: Secondary | ICD-10-CM | POA: Diagnosis not present

## 2016-01-25 DIAGNOSIS — K219 Gastro-esophageal reflux disease without esophagitis: Secondary | ICD-10-CM | POA: Insufficient documentation

## 2016-01-25 DIAGNOSIS — Z7982 Long term (current) use of aspirin: Secondary | ICD-10-CM | POA: Insufficient documentation

## 2016-01-25 MED ORDER — HYDROCODONE-ACETAMINOPHEN 5-325 MG PO TABS: ORAL_TABLET | ORAL | Status: DC

## 2016-01-25 NOTE — Telephone Encounter (Signed)
Agree. Er visit if pain severe.

## 2016-01-25 NOTE — Telephone Encounter (Signed)
Patient Name: JOSALYN ESTELA DOB: 1956-05-16 Initial Comment Caller says she reported a pain on her left side from her back on the left to her front/hip to the doctor in March, and pain has gotten much worse, it takes her a long time to walk. Pain shoots to her left knee; terrible pain with any movement Nurse Assessment Nurse: Vallery Sa, RN, [place] Date/Time (Eastern Time): 01/25/2016 11:48:39 AM Confirm and document reason for call. If symptomatic, describe symptoms. You must click the next button to save text entered. ---Judson Roch states she developed low back pain about 4 months that now shoots into her left hip and leg. No new injury in the past 3 days. No fever. Has the patient traveled out of the country within the last 30 days? ---No Does the patient have any new or worsening symptoms? ---Yes Will a triage be completed? ---Yes Related visit to physician within the last 2 weeks? ---No Does the PT have any chronic conditions? (i.e. diabetes, asthma, etc.) ---Yes List chronic conditions. ---Diabetes, Depression, Anxiety Is this a behavioral health or substance abuse call? ---No Guidelines Guideline Title Affirmed Question Affirmed Notes Back Pain [1] Abdominal pain AND [2] age > 17 Final Disposition User Go to ED Now (or PCP triage) Vallery Sa, RN, [place] Referrals [facility] - ED Disagree/Comply: Comply

## 2016-01-25 NOTE — ED Notes (Signed)
Lower back pain that radiates to her groin off and on x 4 weeks worse this week

## 2016-01-25 NOTE — Discharge Instructions (Signed)
For pain control please take ibuprofen (also known as Motrin or Advil) 800mg  (this is normally 4 over the counter pills) 3 times a day  for 5 days. Take with food to minimize stomach irritation.  Take vicodin for breakthrough pain, do not drink alcohol, drive, care for children or do other critical tasks while taking vicodin.;  Please follow with your primary care doctor in the next 2 days for a check-up. They must obtain records for further management.   Do not hesitate to return to the Emergency Department for any new, worsening or concerning symptoms.    Lumbosacral Radiculopathy Lumbosacral radiculopathy is a condition that involves the spinal nerves and nerve roots in the low back and bottom of the spine. The condition develops when these nerves and nerve roots move out of place or become inflamed and cause symptoms. CAUSES This condition may be caused by:  Pressure from a disk that bulges out of place (herniated disk). A disk is a plate of cartilage that separates bones in the spine.  Disk degeneration.  A narrowing of the bones of the lower back (spinal stenosis).  A tumor.  An infection.  An injury that places sudden pressure on the disks that cushion the bones of your lower spine. RISK FACTORS This condition is more likely to develop in:  Males aged 30-50 years.  Females aged 46-60 years.  People who lift improperly.  People who are overweight or live a sedentary lifestyle.  People who smoke.  People who perform repetitive activities that strain the spine. SYMPTOMS Symptoms of this condition include:  Pain that goes down from the back into the legs (sciatica). This is the most common symptom. The pain may be worse with sitting, coughing, or sneezing.  Pain and numbness in the arms and legs.  Muscle weakness.  Tingling.  Loss of bladder control or bowel control. DIAGNOSIS This condition is diagnosed with a physical exam and medical history. If the pain is  lasting, you may have tests, such as:  MRI scan.  X-ray.  CT scan.  Myelogram.  Nerve conduction study. TREATMENT This condition is often treated with:  Hot packs and ice applied to affected areas.  Stretches to improve flexibility.  Exercises to strengthen back muscles.  Physical therapy.  Pain medicine.  A steroid injection in the spine. In some cases, no treatment is needed. If the condition is long-lasting (chronic), or if symptoms are severe, treatment may involve surgery or lifestyle changes, such as following a weight loss plan. HOME CARE INSTRUCTIONS Medicines  Take medicines only as directed by your health care provider.  Do not drive or operate heavy machinery while taking pain medicine. Injury Care  Apply a heat pack to the injured area as directed by your health care provider.  Apply ice to the affected area:  Put ice in a plastic bag.  Place a towel between your skin and the bag.  Leave the ice on for 20-30 minutes, every 2 hours while you are awake or as needed. Or, leave the ice on for as long as directed by your health care provider. Other Instructions  If you were shown how to do any exercises or stretches, do them as directed by your health care provider.  If your health care provider prescribed a diet or exercise program, follow it as directed.  Keep all follow-up visits as directed by your health care provider. This is important. SEEK MEDICAL CARE IF:  Your pain does not improve over time even  when taking pain medicines. SEEK IMMEDIATE MEDICAL CARE IF:  Your develop severe pain.  Your pain suddenly gets worse.  You develop increasing weakness in your legs.  You lose the ability to control your bladder or bowel.  You have difficulty walking or balancing.  You have a fever.   This information is not intended to replace advice given to you by your health care provider. Make sure you discuss any questions you have with your health  care provider.   Document Released: 09/15/2005 Document Revised: 01/30/2015 Document Reviewed: 09/11/2014 Elsevier Interactive Patient Education [employer].

## 2016-01-25 NOTE — ED Provider Notes (Signed)
CSN: [id number]     Arrival date & time 01/25/16  1453 History   By signing my name below, I, Lisa Odom, attest that this documentation has been prepared under the direction and in the presence of [employer], PA-C. Electronically Signed: Stephania Odom, ED Scribe. 01/25/2016. 3:27 PM.  Chief Complaint  Patient presents with  . Back Pain   The history is provided by the patient. No language interpreter was used.    HPI Comments: Lisa Odom is a 60 y.o. female with a history of neuropathy, who presents to the Emergency Department complaining of gradual-onset, lower back pain radiating to her groin that has been intermittent for 1 month but which worsened this week. She rates her pain presently at 4/10. No treatments or modifying factors were noted. She reports she is able to walk, but she has had to walk with an antalgic gait by "shuffling." Patient states she takes nortriptyline prn as a sleep aid. She denies bowel or bladder incontinence, numbness, weakness, or difficulty walking.    PCP: [name], MD   Past Medical History  Diagnosis Date  . Diverticulosis   . Hiatal hernia   . Gastritis   . Migraine   . Depression   . Anxiety   . GERD (gastroesophageal reflux disease)   . Contact lens/glasses fitting     wears contacts or glasses  . Diabetes mellitus without complication ([place])   . Family history of adverse reaction to anesthesia     pts sister has severe N/V  . Panic attacks   . Hemorrhoid   . Neuropathy [facility])    Past Surgical History  Procedure Laterality Date  . Shoulder arthroscopy with rotator cuff repair  2010    right  . Carpal tunnel release  2002    right  . Tubal ligation    . Colonoscopy    . Upper gi endoscopy    . Trigger finger release Right 03/23/2013    Procedure: RELEASE TRIGGER FINGER/A-1 PULLEY RIGHT RING FINGER;  Surgeon: [name], MD;  Location: [place];  Service: Orthopedics;  Laterality: Right;  . Anterior cervical  decomp/discectomy fusion N/A 09/12/2014    Procedure: Evacuation of cervical hematoma;  Surgeon: [name], MD;  Location: [place] NEURO ORS;  Service: Neurosurgery;  Laterality: N/A;  . Anterior cervical decomp/discectomy fusion N/A 09/12/2014    Procedure: CERVICAL FOUR-FIVE, CERVICAL FIVE-SIX, CERVICAL SIX-SEVEN ANTERIOR CERVICAL DECOMPRESSION/DISCECTOMY FUSION;  Surgeon: [name], MD;  Location: [place] NEUR[facility] ORS;  Service: Neurosurgery;  Laterality: N/A;   Family History  Problem Relation Age of Onset  . Heart disease Brother   . Ovarian cancer Cousin   . Breast cancer Maternal Grandmother     grandmother  . Diabetes Maternal Aunt     aunt  . Diabetes Sister   . Anxiety disorder Sister   . Alcohol abuse Maternal Uncle     uncle  . Diabetes Paternal Aunt   . Alcohol abuse Paternal Uncle    Social History  Substance Use Topics  . Smoking status: Never Smoker   . Smokeless tobacco: Never Used  . Alcohol Use: No   OB History    No data available     Review of Systems A complete 10 system review of systems was obtained and all systems are negative except as noted in the HPI and PMH.    Allergies  Sulfonamide derivatives and Hydrochlorothiazide  Home Medications   Prior to Admission medications   Medication  Sig Start Date End Date Taking? Authorizing Provider  ALPRAZolam Duanne Moron) 1 MG tablet Take 1 mg by mouth 2 (two) times daily as needed for anxiety.    Historical Provider, MD  amphetamine-dextroamphetamine (ADDERALL XR) 30 MG 24 hr capsule Take 30 mg by mouth daily.  02/01/15   Historical Provider, MD  aspirin 81 MG chewable tablet Chew by mouth daily.    Historical Provider, MD  b complex vitamins capsule Take 1 capsule by mouth every other day.     Historical Provider, MD  benztropine (COGENTIN) 1 MG tablet Take 1 mg by mouth 2 (two) times daily.    Historical Provider, MD  Biotin 5 MG TABS Take 1 tablet by mouth every other day.     Historical Provider, MD  buPROPion  (WELLBUTRIN XL) 150 MG 24 hr tablet Take 150 mg by mouth daily. 10/13/15   Historical Provider, MD  celecoxib (CELEBREX) 200 MG capsule Take 1 capsule (200 mg total) by mouth daily. 10/29/15   [name], MD  DULoxetine (CYMBALTA) 60 MG capsule Take 2 capsules (120 mg total) by mouth daily. 08/13/15   [name], MD  gabapentin (NEURONTIN) 600 MG tablet TAKE 1/2 TABLET TWICE DAILY AND 2 TABS AT BEDTIME 10/15/15   Historical Provider, MD  glucose blood (ONE TOUCH ULTRA TEST) test strip Use to check blood sugar daily.  Dx: 250.60 01/09/14   [name], MD  Multiple Vitamin (MULTIVITAMIN) tablet Take 1 tablet by mouth daily.    Historical Provider, MD  nortriptyline (PAMELOR) 25 MG capsule Take 1-2 capsules (25-50 mg total) by mouth at bedtime. 12/20/15   [name], MD  [facility] DELICA LANCETS [id number] MISC Use to check blood sugar daily.  Dx: E11.49 10/13/14   [name], MD  pantoprazole (PROTONIX) 40 MG tablet Take 1 tablet (40 mg total) by mouth daily. 08/13/15   [name], MD   BP 128/74 mmHg  Pulse 100  Temp(Src) 98.5 F (36.9 C)  Resp 12  SpO2 99% Physical Exam  Constitutional: She is oriented to person, place, and time. She appears well-developed and well-nourished. No distress.  HENT:  Head: Normocephalic and atraumatic.  Eyes: Conjunctivae and EOM are normal.  Neck: Normal range of motion. Neck supple. No tracheal deviation present.  Cardiovascular: Normal rate, regular rhythm and intact distal pulses.   Pulmonary/Chest: Effort normal. No respiratory distress.  Abdominal: Soft. There is no tenderness.  Musculoskeletal: Normal range of motion.  Neurological: She is alert and oriented to person, place, and time.  No point tenderness to percussion of lumbar spinal processes.  No TTP or paraspinal muscular spasm. Strength is 5 out of 5 to bilateral lower extremities at hip and knee; extensor hallucis longus 5 out of 5. Ankle strength 5 out of 5, no clonus, neurovascularly  intact. No saddle anaesthesia. Patellar reflexes are 2+ bilaterally.    Strait Leg raise is negative bilaterally.   Skin: Skin is warm and dry.  Psychiatric: She has a normal mood and affect. Her behavior is normal.  Nursing note and vitals reviewed.   ED Course  Procedures (including critical care time)  DIAGNOSTIC STUDIES: Oxygen Saturation is 99% on RA, normal by my interpretation.    COORDINATION OF CARE: 3:25 PM - Suspect sciatica. Discussed treatment plan with pt at bedside which includes ibuprofen and Vicodin. Pt cautioned about sedating side effects of medication and advised to refrain from using nortriptyline while using Vicodin. Pt verbalized understanding and agreed to  plan.   MDM   Final diagnoses:  Lumbar radiculopathy, acute    Filed Vitals:   01/25/16 1509 01/25/16 1633  BP: 128/74 110/72  Pulse: 100 87  Temp: 98.5 F (36.9 C)   Resp: 12 14  SpO2: 99% 97%     Niyathi Bhullar is 60 y.o. female presenting with  back pain.  No neurological deficits and normal neuro exam.  Patient can walk but states is painful.  No loss of bowel or bladder control.  No concern for cauda equina.  No fever, night sweats, weight loss, h/o cancer, IVDU.  RICE protocol and pain medicine indicated and discussed with patient.  Evaluation does not show pathology that would require ongoing emergent intervention or inpatient treatment. Pt is hemodynamically stable and mentating appropriately. Discussed findings and plan with patient/guardian, who agrees with care plan. All questions answered. Return precautions discussed and outpatient follow up given.   New Prescriptions   HYDROCODONE-ACETAMINOPHEN (NORCO/VICODIN) 5-325 MG TABLET    Take 1-2 tablets by mouth every 6 hours as needed for pain and/or cough.     I personally performed the services described in this documentation, which was scribed in my presence. The recorded information has been reviewed and is accurate.     Monico Blitz, PA-C 01/25/16 1639  Harvel Quale, MD 02/02/16 [phone]

## 2016-02-05 ENCOUNTER — Other Ambulatory Visit: Payer: Self-pay

## 2016-02-05 DIAGNOSIS — G894 Chronic pain syndrome: Secondary | ICD-10-CM

## 2016-02-05 DIAGNOSIS — F32A Depression, unspecified: Secondary | ICD-10-CM

## 2016-02-05 DIAGNOSIS — F329 Major depressive disorder, single episode, unspecified: Secondary | ICD-10-CM

## 2016-02-05 MED ORDER — DULOXETINE HCL 60 MG PO CPEP: 120.0000 mg | ORAL_CAPSULE | Freq: Every day | ORAL | Status: DC

## 2016-02-28 ENCOUNTER — Ambulatory Visit (INDEPENDENT_AMBULATORY_CARE_PROVIDER_SITE_OTHER): Payer: BLUE CROSS/BLUE SHIELD | Admitting: Family Medicine

## 2016-02-28 ENCOUNTER — Encounter: Payer: Self-pay | Admitting: Family Medicine

## 2016-02-28 ENCOUNTER — Ambulatory Visit (INDEPENDENT_AMBULATORY_CARE_PROVIDER_SITE_OTHER)
Admission: RE | Admit: 2016-02-28 | Discharge: 2016-02-28 | Disposition: A | Discharge status: Home / Self Care | Payer: BLUE CROSS/BLUE SHIELD | Source: Ambulatory Visit | Attending: Family Medicine | Admitting: Family Medicine

## 2016-02-28 VITALS — BP 118/70 | HR 106 | Temp 97.9°F | Ht 61.0 in | Wt 143.2 lb

## 2016-02-28 DIAGNOSIS — M25511 Pain in right shoulder: Secondary | ICD-10-CM

## 2016-02-28 DIAGNOSIS — M797 Fibromyalgia: Secondary | ICD-10-CM

## 2016-02-28 MED ORDER — TRAMADOL HCL 50 MG PO TABS: 50.0000 mg | ORAL_TABLET | Freq: Three times a day (TID) | ORAL | Status: DC | PRN

## 2016-02-28 NOTE — Progress Notes (Signed)
Subjective:    Patient ID: Lisa Odom, female    DOB: Jan 31, 1956, 60 y.o.   MRN: [id number]    HPI  60 year old female with chronic neck pain, fibromyalgia, cervical spinal stenosis presents with new onset pain in shoulder.  Gradually ongoing x months now worse in last week. No proceeding injury or fall.  Radiates to hand. Hurts at rest, throbbing. No numbness and tingling.  Decreased ROM, increase pain with raising right arm. Keeping her up at night. Took leftover hydrocodone for pain last night, to sleep.  "feels like it did prior rotator cuff surgery"  She is using celebrex at night.. Not helping shoulder.Marland Kitchen Has helped some with chronic knee pain.   Hx of rotator cuff surgery in right shoulder about 6-7 years ago. Dr. Mardelle Matte.    Hx of ervical decompression in 08/2014  Social History /Family History/Past Medical History reviewed and updated if needed.  Review of Systems  Constitutional: Negative for fever and fatigue.  HENT: Negative for ear pain.   Eyes: Negative for pain.  Respiratory: Negative for chest tightness and shortness of breath.   Cardiovascular: Negative for chest pain, palpitations and leg swelling.  Gastrointestinal: Negative for abdominal pain.  Genitourinary: Negative for dysuria.       Objective:   Physical Exam  Constitutional: Vital signs are normal. She appears well-developed and well-nourished. She is cooperative.  Non-toxic appearance. She does not appear ill. No distress.  HENT:  Head: Normocephalic.  Right Ear: Hearing, tympanic membrane, external ear and ear canal normal. Tympanic membrane is not erythematous, not retracted and not bulging.  Left Ear: Hearing, tympanic membrane, external ear and ear canal normal. Tympanic membrane is not erythematous, not retracted and not bulging.  Nose: No mucosal edema or rhinorrhea. Right sinus exhibits no maxillary sinus tenderness and no frontal sinus tenderness. Left sinus exhibits no maxillary sinus  tenderness and no frontal sinus tenderness.  Mouth/Throat: Uvula is midline, oropharynx is clear and moist and mucous membranes are normal.  Eyes: Conjunctivae, EOM and lids are normal. Pupils are equal, round, and reactive to light. Lids are everted and swept, no foreign bodies found.  Neck: Trachea normal and normal range of motion. Neck supple. Carotid bruit is not present. No thyroid mass and no thyromegaly present.  Cardiovascular: Normal rate, regular rhythm, S1 normal, S2 normal, normal heart sounds, intact distal pulses and normal pulses.  Exam reveals no gallop and no friction rub.   No murmur heard. Pulmonary/Chest: Effort normal and breath sounds normal. No tachypnea. No respiratory distress. She has no decreased breath sounds. She has no wheezes. She has no rhonchi. She has no rales.  Abdominal: Soft. Normal appearance and bowel sounds are normal. There is no tenderness.  Musculoskeletal:       Cervical back: She exhibits decreased range of motion, tenderness and bony tenderness.       Left upper arm: She exhibits tenderness and bony tenderness. She exhibits no swelling.       Left forearm: She exhibits tenderness, bony tenderness and swelling.  Neurological: She is alert. No cranial nerve deficit or sensory deficit.   4/5 Decreased strength in right arm all movements due to pain   Positive neer's  Neg drop arm test.  Skin: Skin is warm, dry and intact. No rash noted.  Psychiatric: Her speech is normal and behavior is normal. Judgment and thought content normal. Her mood appears not anxious. Cognition and memory are normal. She does not exhibit a depressed mood.  Assessment & Plan:

## 2016-02-28 NOTE — Assessment & Plan Note (Signed)
Inadequate control, minimal improvement on higher dose of nortriptiline.

## 2016-02-28 NOTE — Progress Notes (Signed)
Pre visit review using our clinic review tool, if applicable. No additional management support is needed unless otherwise documented below in the visit note. 

## 2016-02-28 NOTE — Assessment & Plan Note (Addendum)
Eval with X-ray. Pain is somewhat diffuse and pt seems to over-react and jump dramatically with light touch to neck and shoulder and arm.  ? If all due to fibromyalgia. ? If recurrent rotator cuff.  If X-ray stable.. Consider referral to Dr. Mardelle Matte for consideration of steroid injection. Home PT info given. Continue celebrex and can use tramadol for breakthrough pain.

## 2016-02-28 NOTE — Patient Instructions (Addendum)
We will call you with the X-ray results.  Start home physical therapy. Can use tramadol for pain as needed. Hold hydrocodone to avoid oversedation.

## 2016-03-03 ENCOUNTER — Telehealth: Payer: Self-pay | Admitting: Family Medicine

## 2016-03-03 ENCOUNTER — Other Ambulatory Visit: Payer: Self-pay | Admitting: Family Medicine

## 2016-03-03 DIAGNOSIS — M25511 Pain in right shoulder: Secondary | ICD-10-CM

## 2016-03-03 NOTE — Telephone Encounter (Signed)
-----   Message from Modena Nunnery, [place] sent at 02/29/2016  3:24 PM EDT ----- Spoke to pt and informed her of results. Pt is agreeable to referral and was advised to await a call with appt details

## 2016-03-05 ENCOUNTER — Other Ambulatory Visit: Payer: Self-pay | Admitting: *Deleted

## 2016-03-05 DIAGNOSIS — F32A Depression, unspecified: Secondary | ICD-10-CM

## 2016-03-05 DIAGNOSIS — G894 Chronic pain syndrome: Secondary | ICD-10-CM

## 2016-03-05 DIAGNOSIS — F329 Major depressive disorder, single episode, unspecified: Secondary | ICD-10-CM

## 2016-03-05 MED ORDER — CELECOXIB 200 MG PO CAPS: 200.0000 mg | ORAL_CAPSULE | Freq: Every day | ORAL | Status: DC

## 2016-03-13 ENCOUNTER — Telehealth: Payer: Self-pay | Admitting: Family Medicine

## 2016-03-13 DIAGNOSIS — G894 Chronic pain syndrome: Secondary | ICD-10-CM

## 2016-03-13 MED ORDER — PANTOPRAZOLE SODIUM 40 MG PO TBEC: 40.0000 mg | DELAYED_RELEASE_TABLET | Freq: Every day | ORAL | Status: DC

## 2016-03-13 NOTE — Telephone Encounter (Signed)
Ms. Bramante notified refill has been sent to her pharmacy.

## 2016-03-13 NOTE — Telephone Encounter (Signed)
Pt left message on triage phone requesting refill on pantoprazole (PROTONIX) 40 MG tablet .  She understands that PCP had not originally prescribed.  Please advise.  Pt is requesting callback at [phone]

## 2016-03-13 NOTE — Telephone Encounter (Signed)
Refill sent into local pharmacy  

## 2016-03-28 ENCOUNTER — Telehealth: Payer: Self-pay | Admitting: Family Medicine

## 2016-03-28 NOTE — Telephone Encounter (Signed)
This is a complicated psychiatric patient. Forms need to be completed by her psychiatrist.

## 2016-03-28 NOTE — Telephone Encounter (Signed)
[employer] faxed over paperwork for ling term disability forms to be filled out They also wanted copies of records from 9/16.  I sent this request to [employer] to copied and sent to them  In dr Diona Browner  In Box

## 2016-03-31 NOTE — Telephone Encounter (Signed)
Pt aware psychiatrist needs to fill out paperwork

## 2016-04-07 ENCOUNTER — Other Ambulatory Visit: Payer: Self-pay | Admitting: Urgent Care

## 2016-04-09 ENCOUNTER — Other Ambulatory Visit: Payer: Self-pay | Admitting: Family Medicine

## 2016-04-10 ENCOUNTER — Encounter: Payer: Self-pay | Admitting: Family Medicine

## 2016-04-10 ENCOUNTER — Ambulatory Visit (INDEPENDENT_AMBULATORY_CARE_PROVIDER_SITE_OTHER): Payer: BLUE CROSS/BLUE SHIELD | Admitting: Family Medicine

## 2016-04-10 ENCOUNTER — Other Ambulatory Visit: Payer: Self-pay | Admitting: Family Medicine

## 2016-04-10 VITALS — BP 110/80 | HR 106 | Temp 97.9°F | Ht 61.5 in | Wt 142.3 lb

## 2016-04-10 DIAGNOSIS — R2689 Other abnormalities of gait and mobility: Secondary | ICD-10-CM | POA: Insufficient documentation

## 2016-04-10 DIAGNOSIS — M797 Fibromyalgia: Secondary | ICD-10-CM

## 2016-04-10 DIAGNOSIS — R413 Other amnesia: Secondary | ICD-10-CM | POA: Diagnosis not present

## 2016-04-10 DIAGNOSIS — R29818 Other symptoms and signs involving the nervous system: Secondary | ICD-10-CM | POA: Diagnosis not present

## 2016-04-10 DIAGNOSIS — F332 Major depressive disorder, recurrent severe without psychotic features: Secondary | ICD-10-CM

## 2016-04-10 NOTE — Telephone Encounter (Signed)
Last filled on 12/20/15 #60 +3, last OV was today. Ok to refill?

## 2016-04-10 NOTE — Assessment & Plan Note (Signed)
She has unusual behavoir element that makes me feel her issue may be most llikely due to poor control of mood and medication side effects.  Given her abnormal neuro exam as well as neuro changes as well as the ? Issues starting after surgery. We will eval further with MRI brain. She has seen neuro in past without much assessment on these issues. May need referral specifically for memory, balnce and neuro changes.   There are no sedating meds that are easy to stop given pt's mod and pain worsen off these meds per pt.

## 2016-04-10 NOTE — Progress Notes (Signed)
60 year old female with chronic neck pain, fibromyalgia, cervical spinal stenosis presents for follow up.    Pain in right  Shoulder.: At last OV neg X-ray. Referred to Ortho.  She was given a cortisone shot. Pain has improved but she still has decreased ROM. Hx of rotator cuff surgery in right shoulder about 6-7 years ago. Dr. Mardelle Matte.  Hx of ervical decompression in 08/2014 She is using celebrex at night.. Not helping shoulder.Lisa Odom Has helped some with chronic knee pain. Hx of rotator cuff surgery in right shoulder about 6-7 years ago. Dr. Mardelle Matte.   Hx of cervical decompression in 08/2014  She continues to have  memory issues, confusion. Ongoing for years, but significantly worse in last  6-8 months. She feels it has progressively worsened.  She has been struggling with poor control of severe depression for years, sees Dr. Toy Care and is on wellbutrin, nortriptiline, alprazolam. She feels mood is better controlled at this point than previously. Sleeps okay once she goes to sleep. Also on gabapentin for neuropathy and tramadol for fibromyalgia. At previous appt sister felt symptoms started after her cervical decompression. ? Surgical CVA, hypoxia and brain injury   She drops things easily, balance is poor, intermittent dizziness, falling frequently. No changes in vision.  Sometimes has issue communicating.  No recent changes in meds. She is not willing to come off her current med. " Being off the meds is worse than any SE from medicine"  She has seen neuro in past 02/2015.Lisa Odom Never really discussed memory etc. Focused on whole body pain and wrist issues   MMSE today 25/30 .Lisa Odom Could not spell world backward, did not known date  Very slow in responses   Social History /Family History/Past Medical History reviewed and updated if needed.  Review of Systems  Constitutional: Negative for fever and fatigue.  HENT: Negative for ear pain.  Eyes: Negative for pain.  Respiratory: Negative for chest  tightness and shortness of breath.  Cardiovascular: Negative for chest pain, palpitations and leg swelling.  Gastrointestinal: Negative for abdominal pain.  Genitourinary: Negative for dysuria.       Objective:   Physical Exam  Constitutional: Vital signs are normal. She appears well-developed and well-nourished. She is cooperative. Non-toxic appearance. She does not appear ill. No distress.  HENT:  Head: Normocephalic.  Right Ear: Hearing, tympanic membrane, external ear and ear canal normal. Tympanic membrane is not erythematous, not retracted and not bulging.  Left Ear: Hearing, tympanic membrane, external ear and ear canal normal. Tympanic membrane is not erythematous, not retracted and not bulging.  Nose: No mucosal edema or rhinorrhea. Right sinus exhibits no maxillary sinus tenderness and no frontal sinus tenderness. Left sinus exhibits no maxillary sinus tenderness and no frontal sinus tenderness.  Mouth/Throat: Uvula is midline, oropharynx is clear and moist and mucous membranes are normal.  Eyes: Conjunctivae, EOM and lids are normal. Pupils are equal, round, and reactive to light. Lids are everted and swept, no foreign bodies found.  Neck: Trachea normal and normal range of motion. Neck supple. Carotid bruit is not present. No thyroid mass and no thyromegaly present.  Cardiovascular: Normal rate, regular rhythm, S1 normal, S2 normal, normal heart sounds, intact distal pulses and normal pulses. Exam reveals no gallop and no friction rub.  No murmur heard. Pulmonary/Chest: Effort normal and breath sounds normal. No tachypnea. No respiratory distress. She has no decreased breath sounds. She has no wheezes. She has no rhonchi. She has no rales.  Abdominal: Soft. Normal  appearance and bowel sounds are normal. There is no tenderness.  Musculoskeletal:   Cervical back: She exhibits decreased range of motion, tenderness and bony tenderness.   Left upper arm: She exhibits  tenderness and bony tenderness. She exhibits no swelling.   Left forearm: She exhibits tenderness, bony tenderness and swelling.  Neurological: She is alert. No cranial nerve deficit or sensory deficit.  4/5 Decreased strength in right arm all movements due to pain  Cannot abduct right shoulder above 70degrees Neg neer's Neg drop arm test.  Skin: Skin is warm, dry and intact. No rash noted.  Psychiatric: Her speech is slightly slurred and behavior isabnormal. Judgment and thought content delayedl. Her mood appears not anxious but depressed, tearful. Cognition and memory are decreased .  NEURO: nml cerebellar exam. Gait slowed and antalgic Nml cranial nerve exam but unable to keep eye open ( "makes me feel confused to follow the light" for exam with light

## 2016-04-10 NOTE — Patient Instructions (Signed)
Stop at front desk to set up MRI brain.

## 2016-04-18 ENCOUNTER — Ambulatory Visit
Admission: RE | Admit: 2016-04-18 | Discharge: 2016-04-18 | Disposition: A | Discharge status: Home / Self Care | Payer: BLUE CROSS/BLUE SHIELD | Source: Ambulatory Visit | Attending: Family Medicine | Admitting: Family Medicine

## 2016-04-18 DIAGNOSIS — R2689 Other abnormalities of gait and mobility: Secondary | ICD-10-CM

## 2016-04-18 DIAGNOSIS — R413 Other amnesia: Secondary | ICD-10-CM

## 2016-04-18 MED ORDER — GADOBENATE DIMEGLUMINE 529 MG/ML IV SOLN
13.0000 mL | Freq: Once | INTRAVENOUS | Status: AC | PRN
  Administered 2016-04-18: 13 mL via INTRAVENOUS

## 2016-04-22 ENCOUNTER — Telehealth: Payer: Self-pay | Admitting: Family Medicine

## 2016-04-22 DIAGNOSIS — E1140 Type 2 diabetes mellitus with diabetic neuropathy, unspecified: Secondary | ICD-10-CM

## 2016-04-22 DIAGNOSIS — E7800 Pure hypercholesterolemia, unspecified: Secondary | ICD-10-CM

## 2016-04-22 NOTE — Telephone Encounter (Signed)
MRI results discussed with patient.  See result note under imaging on MRI dated 04/18/2016.

## 2016-04-22 NOTE — Telephone Encounter (Signed)
-----   Message from Marchia Bond sent at 04/22/2016  3:40 PM EDT ----- Regarding: Labs tomorrow, need orders. Thanks! :-) Please order future f/u labs for pt's upcoming lab appt. Thanks Aniceto Boss

## 2016-04-22 NOTE — Telephone Encounter (Signed)
Pt returned your call.  

## 2016-04-23 ENCOUNTER — Other Ambulatory Visit (INDEPENDENT_AMBULATORY_CARE_PROVIDER_SITE_OTHER): Payer: BLUE CROSS/BLUE SHIELD

## 2016-04-23 DIAGNOSIS — E1140 Type 2 diabetes mellitus with diabetic neuropathy, unspecified: Secondary | ICD-10-CM

## 2016-04-23 DIAGNOSIS — E7800 Pure hypercholesterolemia, unspecified: Secondary | ICD-10-CM | POA: Diagnosis not present

## 2016-04-23 LAB — COMPREHENSIVE METABOLIC PANEL
ALBUMIN: 4.2 g/dL (ref 3.5–5.2)
ALK PHOS: 88 U/L (ref 39–117)
ALT: 18 U/L (ref 0–35)
AST: 17 U/L (ref 0–37)
BUN: 15 mg/dL (ref 6–23)
CHLORIDE: 104 meq/L (ref 96–112)
CO2: 30 mEq/L (ref 19–32)
Calcium: 9.5 mg/dL (ref 8.4–10.5)
Creatinine, Ser: 0.69 mg/dL (ref 0.40–1.20)
GFR: 110.72 mL/min (ref >60.00)
GLUCOSE: 100 mg/dL — AB (ref 70–99)
POTASSIUM: 3.9 meq/L (ref 3.5–5.1)
SODIUM: 139 meq/L (ref 135–145)
TOTAL PROTEIN: 7.2 g/dL (ref 6.0–8.3)
Total Bilirubin: 0.5 mg/dL (ref 0.2–1.2)

## 2016-04-23 LAB — LIPID PANEL
CHOLESTEROL: 180 mg/dL (ref 0–200)
HDL: 48.8 mg/dL (ref >39.00)
LDL CALC: 109 mg/dL — AB (ref 0–99)
NonHDL: 131.05
Total CHOL/HDL Ratio: 4
Triglycerides: 108 mg/dL (ref 0.0–149.0)
VLDL: 21.6 mg/dL (ref 0.0–40.0)

## 2016-04-23 LAB — HEMOGLOBIN A1C: Hgb A1c MFr Bld: 6 % (ref 4.6–6.5)

## 2016-04-28 ENCOUNTER — Ambulatory Visit: Payer: Self-pay | Admitting: Family Medicine

## 2016-04-30 ENCOUNTER — Ambulatory Visit (INDEPENDENT_AMBULATORY_CARE_PROVIDER_SITE_OTHER): Payer: BLUE CROSS/BLUE SHIELD | Admitting: Family Medicine

## 2016-04-30 ENCOUNTER — Encounter: Payer: Self-pay | Admitting: Family Medicine

## 2016-04-30 ENCOUNTER — Telehealth: Payer: Self-pay | Admitting: Family Medicine

## 2016-04-30 DIAGNOSIS — R413 Other amnesia: Secondary | ICD-10-CM

## 2016-04-30 DIAGNOSIS — R410 Disorientation, unspecified: Secondary | ICD-10-CM | POA: Insufficient documentation

## 2016-04-30 DIAGNOSIS — M797 Fibromyalgia: Secondary | ICD-10-CM

## 2016-04-30 DIAGNOSIS — R29818 Other symptoms and signs involving the nervous system: Secondary | ICD-10-CM | POA: Diagnosis not present

## 2016-04-30 DIAGNOSIS — R2689 Other abnormalities of gait and mobility: Secondary | ICD-10-CM

## 2016-04-30 DIAGNOSIS — F332 Major depressive disorder, recurrent severe without psychotic features: Secondary | ICD-10-CM

## 2016-04-30 DIAGNOSIS — R4781 Slurred speech: Secondary | ICD-10-CM

## 2016-04-30 NOTE — Telephone Encounter (Signed)
Pt called stating she call dr patel (neurology) to make an appointment.  They are not making any appointment till oct and they need a referral

## 2016-04-30 NOTE — Patient Instructions (Addendum)
Call for appt for return to neurology  specificallty for balance issues, memory and neurologic changes.. Dr Posey Pronto.  Use tylenol for pain.  Stop cogentin.  Decrease to nortriptyline 25 mg x 1 week, then stop. Schedule follow up here after you see neurologist.

## 2016-04-30 NOTE — Assessment & Plan Note (Signed)
Spoke in detail with her psychiatrist Dr. Toy Care.  She has no need for cogentin and this can be D/C'd as it can cause confusion.  Given mood finally well controlled, decreasing medication is difficult. If continued symptoms and neg neuro eval... At 06/2016 OV Dr. Toy Care can reconsider decreasing her sedating medications.

## 2016-04-30 NOTE — Progress Notes (Signed)
Pre visit review using our clinic review tool, if applicable. No additional management support is needed unless otherwise documented below in the visit note. 

## 2016-04-30 NOTE — Assessment & Plan Note (Signed)
Difficult situation.. Pt with polypharmacy and possible SE causing blance issue, memory issues etc, but pain remains poorly controlled.  All fibro points are tender. Continue neurontin, cymbalta, celebrex..  Use tyelnol for pain.

## 2016-04-30 NOTE — Telephone Encounter (Signed)
Referral placed.

## 2016-04-30 NOTE — Assessment & Plan Note (Signed)
Ongoing for years, worse lately.. sister felt symptoms started after her cervical decompression. ? Surgical CVA, hypoxia and brain injury   She drops things easily, balance is poor, intermittent dizziness, falling frequently. No changes in vision.  Sometimes has issue communicating, word finding , slurred speech.   Balance  as well as confusion/memory loss is primary reason she was not able to work at this time. ? Due to polypharmacy. ? Due to severe mood disorder, symtpoms do seem to come and go. ? Attention seeking behavoir.  Recent brain MRI negative. Will refer to neuro to request their help in determining if any further work up needs to be done to evaluate these progressive changes.

## 2016-04-30 NOTE — Progress Notes (Signed)
Subjective:    Patient ID: Lisa Odom, female    DOB: 01-Jun-1956, 60 y.o.   MRN: [id number]  HPI  60 year old female with chronic neck pain, fibromyalgia and cervical stenosis presents for follow up on balance issues, memory loss and neuro changes.  At last OV: 04/18/2016: She continues to have  memory issues, confusion. Ongoing for years, but significantly worse in last  6-8 months. She feels it has progressively worsened.  She has been struggling with poor control of severe depression for years, sees Dr. Toy Care and is on wellbutrin, nortriptiline, alprazolam. She feels mood is better controlled at this point than previously. Sleeps okay once she goes to sleep. Also on gabapentin for neuropathy and tramadol for fibromyalgia. At previous appt sister felt symptoms started after her cervical decompression. ? Surgical CVA, hypoxia and brain injury   She drops things easily, balance is poor, intermittent dizziness, falling frequently. No changes in vision.  Sometimes has issue communicating.  She has unusual behavoir element that makes me feel her issue may be most llikely due to poor control of mood and medication side effects She is on multiple sedating medications, but there are no sedating meds that are easy to stop given pt's mod and pain worsen off these meds per pt.   Recent MRI brain showed no specific acute changes. 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  Todays she reports continued symptoms. No changes. No new neuro changes.  She is having trouble sleeping at night because of pain and restless leg symptoms.  Her mood remains poorly controlled. She continues to see psychologist regularly. Next psychiatrist follow up is in 2 months.  She has good days 1-2 times a week, some bad days.  Tramadol has not helped at all with pain, even 100 mg.She is using tylenol arthritis right now.  Pain  Is in entire body, not one area worse than other.  Already on gabapentin, nortryptiline ( added in 09/2015, increase 11/2015, may have helped some), cymballta, celebrex ( added in 09/2015)  Social History /Family History/Past Medical History reviewed and updated if needed.   Review of Systems  Constitutional: Negative for fatigue and fever.  HENT: Negative for ear pain.   Eyes: Negative for pain.  Respiratory: Negative for chest tightness and shortness of breath.   Cardiovascular: Negative for chest pain, palpitations and leg swelling.  Gastrointestinal: Negative for abdominal pain.  Genitourinary: Negative for dysuria.       Objective:   Physical Exam  Constitutional: Vital signs are normal. She appears well-developed and well-nourished. She is cooperative.  Non-toxic appearance. She does not appear ill. No distress.  Fatigued appearing, almost slurred speech  HENT:  Head: Normocephalic.  Right Ear: Hearing, tympanic membrane, external ear and ear canal normal. Tympanic membrane is not erythematous, not retracted and not bulging.  Left Ear: Hearing, tympanic membrane, external ear and ear canal normal. Tympanic membrane is not erythematous, not retracted and not bulging.  Nose: No mucosal edema or rhinorrhea. Right sinus exhibits no maxillary sinus tenderness and no frontal sinus tenderness. Left sinus exhibits no maxillary sinus tenderness and no frontal sinus tenderness.  Mouth/Throat: Uvula is midline, oropharynx is clear and moist and mucous membranes are normal.  Eyes: Conjunctivae, EOM and lids are normal. Pupils are equal, round, and reactive to light. Lids are everted and swept, no foreign bodies found.  Neck: Trachea normal and normal range of motion. Neck supple. Carotid bruit is not  present. No thyroid mass and no thyromegaly present.  Cardiovascular: Normal rate, regular rhythm, S1 normal, S2 normal, normal heart sounds, intact distal pulses and normal pulses.  Exam reveals no gallop and no friction rub.   No murmur  heard. Pulmonary/Chest: Effort normal and breath sounds normal. No tachypnea. No respiratory distress. She has no decreased breath sounds. She has no wheezes. She has no rhonchi. She has no rales.  Abdominal: Soft. Normal appearance and bowel sounds are normal. There is no tenderness.  Musculoskeletal:       Cervical back: She exhibits decreased range of motion, tenderness and bony tenderness.       Lumbar back: She exhibits decreased range of motion, tenderness and bony tenderness.       Left upper arm: She exhibits tenderness and bony tenderness. She exhibits no swelling.       Left forearm: She exhibits tenderness, bony tenderness and swelling.       Right hand: She exhibits decreased range of motion and tenderness.       Left hand: She exhibits decreased range of motion.  18 trigger points tender  Bilateral 1st CMC joint pain and tenderness  Neurological: She is alert. She has normal strength. No cranial nerve deficit or sensory deficit. She displays a negative Romberg sign.   4/5 Decreased strength in right arm all movements due to pain   Positive neer's  Neg drop arm test.  Skin: Skin is warm, dry and intact. No rash noted.  Psychiatric: Her speech is normal. Judgment normal. She is withdrawn. Cognition and memory are impaired. She does not exhibit a depressed mood. She expresses no homicidal and no suicidal ideation. She expresses no suicidal plans and no homicidal plans. She exhibits abnormal recent memory and abnormal remote memory.   She has unusual affect ( laughing crying same time), rolls eyes uncontrollably at times  needs to pause at time to find words, but other times does fine.          Assessment & Plan:

## 2016-05-04 ENCOUNTER — Other Ambulatory Visit: Payer: Self-pay | Admitting: Family Medicine

## 2016-05-07 ENCOUNTER — Other Ambulatory Visit: Payer: Self-pay | Admitting: Family Medicine

## 2016-05-20 ENCOUNTER — Ambulatory Visit (INDEPENDENT_AMBULATORY_CARE_PROVIDER_SITE_OTHER): Payer: BLUE CROSS/BLUE SHIELD | Admitting: Neurology

## 2016-05-20 ENCOUNTER — Other Ambulatory Visit (INDEPENDENT_AMBULATORY_CARE_PROVIDER_SITE_OTHER): Payer: BLUE CROSS/BLUE SHIELD

## 2016-05-20 ENCOUNTER — Encounter: Payer: Self-pay | Admitting: Neurology

## 2016-05-20 VITALS — BP 118/70 | HR 84 | Ht 61.0 in | Wt 140.4 lb

## 2016-05-20 DIAGNOSIS — F332 Major depressive disorder, recurrent severe without psychotic features: Secondary | ICD-10-CM | POA: Diagnosis not present

## 2016-05-20 DIAGNOSIS — R4189 Other symptoms and signs involving cognitive functions and awareness: Secondary | ICD-10-CM | POA: Diagnosis not present

## 2016-05-20 LAB — VITAMIN B12: VITAMIN B 12: 875 pg/mL (ref 211–911)

## 2016-05-20 LAB — TSH: TSH: 0.74 u[IU]/mL (ref 0.35–4.50)

## 2016-05-20 NOTE — Patient Instructions (Signed)
1.  Formal neuropsychological testing 2.  Check vitamin B12 and TSH  We will call you with the results

## 2016-05-20 NOTE — Progress Notes (Signed)
Follow-up Visit   Date: 05/20/16    Lisa Odom MRN: [id number] DOB: September 22, 1956   Interim History: Lisa Odom is a 60 y.o. right-handed African American female with hypertension, well-controlled diabetes mellitus, severe depression (followed by Dr. Toy Care), right CTS release, cervical spinal stenosis s/p anterior cervical decompression (08/2014) returning to the clinic for new complaints of memory loss.  The patient was accompanied to the clinic by sister who also provides collateral information.    History of present illness: Starting in 2013, she developed burning sensation of the feet and lower legs which ultimately led to her diagnosis of neuropathy.  She takes gabapentin 300/300/600 for burning pain which significantly helps her feet pain.  About 42-month ago, she started burning in her hands, arms, and stomach.  She reports having hypersensitivity to clothing or light pressure.  Nothing seems to help her pain, except sleeping.  She feels weak and endorses falling a few times.   She also complains of whole body pain for the past three years which achy, described as throbbing pain of her bones.  Pain is constant and worse with weather changes.  She has tried Aleve without any relief.  Recently evaluated by her PCP with thorough work-up (see below) and exam notable for 18/18 trigger points consistent with fibromyalgia.   She also complains of dizziness, described as near passing out when she gets up too quickly or rolls in bed.  Denies room spinning sensation.  She has no passed out.    Significant psychiatric history of severe depression.  She sleeps every other night. She reports crying a lot at night.  She started seeing counselor and psychiatrist.  She is planning on moving in with her sister. She has been out of work since 11/29/2014 for psychiatric issues.  She has previously seen neurology (Dr. LMelton Alar for migraines. She reports never having a medication that  worked.    UPDATE 05/20/2016:   Patient is here with new complaints of memory impairment which started after her neck surgery in 2015.  Her sister recalls that she was acting very bizarre during her hospitalization and has never returned to herself.  She moved in with her sister in July 2016.  She does her laundry, but her sister says that she cannot comprehend how to change the water temperature and asks if she needs to takes hot water from the sink.  She does not always fold her clothes and "hides" them.  Patient does not help with cooking or household chores.  She sometimes will help take the trash out or with washing the dishes about once per month.  Her nephew had noticed on one occasion that she was sitting under a blanket and randomly threw the remote control in the room, which alarmed everyone because it was unprovoked.  She was checked to see that "it worked".  Her sister and patient reports that her memory can be good some day, but terrible on other.  Her bad days are mostly when her pain is severe.  Her sister thinks she may be taking extra medication these days, but patient denies this. There has not been a gradual or progressive decline in her memory.  Her sister try to allow patient to do as much as she can to maintain her independence.  She manages her own medication and finances.  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  She  continues to have pain related to fibromyaglia.  She only sleeps about being sleep deprived for 8-10 days, then she is able to sleep. She drives occasionally on her good days, but feels that she gets lost easily.    Medications:  Current Outpatient Prescriptions on File Prior to Visit  Medication Sig Dispense Refill  . ALPRAZolam (XANAX) 0.25 MG tablet TAKE 1-2 TABLETS BY MOUTH TWICE A DAY  2  . amphetamine-dextroamphetamine (ADDERALL XR) 30 MG 24 hr capsule Take 30 mg by mouth 2 (two)  times daily.     Marland Kitchen aspirin 81 MG chewable tablet Chew by mouth daily.    Marland Kitchen b complex vitamins capsule Take 1 capsule by mouth every other day.     Marland Kitchen buPROPion (WELLBUTRIN XL) 150 MG 24 hr tablet Take 150 mg by mouth daily.  2  . celecoxib (CELEBREX) 200 MG capsule Take 1 capsule (200 mg total) by mouth daily. 90 capsule 1  . DULoxetine (CYMBALTA) 60 MG capsule TAKE 2 CAPSULES (120 MG TOTAL) BY MOUTH DAILY. 60 capsule 5  . gabapentin (NEURONTIN) 600 MG tablet TAKE 1/2 TABLET TWICE DAILY AND 2 TABS AT BEDTIME  10  . glucose blood (ONE TOUCH ULTRA TEST) test strip Use to check blood sugar daily.  Dx: 250.60 100 each 3  . Multiple Vitamin (MULTIVITAMIN) tablet Take 1 tablet by mouth daily.    Glory Rosebush DELICA LANCETS 80X MISC Use to check blood sugar daily.  Dx: E11.49 100 each 3  . pantoprazole (PROTONIX) 40 MG tablet Take 1 tablet (40 mg total) by mouth daily. 90 tablet 3   No current facility-administered medications on file prior to visit.     Allergies:  Allergies  Allergen Reactions  . Sulfonamide Derivatives     REACTION: Rash  . Hydrochlorothiazide Rash    Review of Systems:  CONSTITUTIONAL: No fevers, chills, night sweats, or weight loss.  EYES: No visual changes or eye pain ENT: No hearing changes.  No history of nose bleeds.   RESPIRATORY: No cough, wheezing and shortness of breath.   CARDIOVASCULAR: Negative for chest pain, and palpitations.   GI: Negative for abdominal discomfort, blood in stools or black stools.  No recent change in bowel habits.   GU:  No history of incontinence.   MUSCLOSKELETAL: No history of joint pain or swelling.  No myalgias.   SKIN: Negative for lesions, rash, and itching.   ENDOCRINE: Negative for cold or heat intolerance, polydipsia or goiter.   PSYCH:  ++ depression or anxiety symptoms.   NEURO: As Above.   Vital Signs:  BP 118/70   Pulse 84   Ht _0  (1.549 m)   Wt 140 lb 6 oz (63.7 kg)   SpO2 98%   BMI 26.52 kg/m   Neurological  Exam: MENTAL STATUS including orientation to time, place, person, recent and remote memory is fair.  She has poor attention and appears distracted easily.  She does not concentrate to complete two step commands.  Speech is not dysarthric. Montreal Cognitive Assessment  05/20/2016  Visuospatial/ Executive (0/5) 4  Naming (0/3) 3  Attention: Read list of digits (0/2) 1  Attention: Read list of letters (0/1) 1  Attention: Serial 7 subtraction starting at 100 (0/3) 1  Language: Repeat phrase (0/2) 1  Language : Fluency (0/1) 0  Abstraction (0/2) 1  Delayed Recall (0/5) 2  Orientation (0/6) 6  Total 20  Adjusted Score (based on education) 20  Some recent data might be hidden  CRANIAL NERVES: Pupils equal round and reactive to light. Normal conjugate, extra-ocular eye movements in all directions of gaze. She blinks and frowns when testing extraocular muscles because of pain. Visual fields are intact.  No ptosis.   Face is symmetric. Palate elevates symmetrically.  Tongue is midline.  MOTOR:  Motor strength is antigravity in all extremities, she does not give full effort to grade strength.    MSRs:  Reflexes are 2+/4 throughout  SENSORY:  Intact to vibration throughout.  COORDINATION/GAIT:  Normal finger-to- nose-finger and heel-to-shin. Inconsistent finger tapping bilaterally.  Gait narrow based and stable.   Data: MRI cervical spine 08/27/2014:  Multilevel advanced spondylosis causing spinal stenosis and foraminal stenosis at multiple levels. This is most severe on the right at C4-5.  Labs 07/2014:  ANA neg, CK 178*, HbA1c 6.4, Lyme neg, ESR 34, TSH 1.880, vitamin D 24.7*  NCS/EMG of the right arm and leg 03/20/2015:   This is a normal study of the right upper and lower extremities.   In particular, there is no evidence of a generalized sensorimotor polyneuropathy, cervical/lumbosacral radiculopathy, diffuse myopathy, or carpal tunnel syndrome.  MRI brain wwo contrast 04/18/2016:    Normal appearance of the brain.  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   IMPRESSION/PLAN: 1.  Cognitive dysfunction likely due to underlying mood disorder, insomnia, and/or polypharmacy.   - Formal neuropsychological testing  - MRI brain was reviewed small Ratke's cyst. Patient reassured that Rathke cleft cysts are typically benign, asymptomatic lesions. She denies any vision loss and her headaches have not changes in character, so this is most likely incidental.    - Check vitamin B12 and TSH  - Precautions with driving were stressed and patient has self-restricted driving on days when she feels particularly impaired  - Emphasized that she engage in activities that are mentally stimulating, start exercise program, and explore new hobbies, but she endorses that her chronic pain interferes with her ability to enjoy anything  2.  Severe depression contributing to her overall feeling of being unwell, followed by Dr. Toy Care and also seeing a counselor  3.  Fibromyalgia and whole body pain, followed by PCP  - NCS/EMG of the right arm did not show evidence of neuropathy or myopathy   The duration of this appointment visit was 40 minutes of face-to-face time with the patient.  Greater than 50% of this time was spent in counseling, explanation of diagnosis, planning of further management, and coordination of care.   Thank you for allowing me to participate in patient's care.  If I can answer any additional questions, I would be pleased to do so.    Sincerely,    Donika K. Posey Pronto, DO

## 2016-05-26 ENCOUNTER — Ambulatory Visit (INDEPENDENT_AMBULATORY_CARE_PROVIDER_SITE_OTHER): Payer: BLUE CROSS/BLUE SHIELD | Admitting: Psychology

## 2016-05-26 ENCOUNTER — Encounter: Payer: Self-pay | Admitting: Psychology

## 2016-05-26 DIAGNOSIS — R4189 Other symptoms and signs involving cognitive functions and awareness: Secondary | ICD-10-CM | POA: Diagnosis not present

## 2016-05-26 DIAGNOSIS — F32A Depression, unspecified: Secondary | ICD-10-CM

## 2016-05-26 DIAGNOSIS — F329 Major depressive disorder, single episode, unspecified: Secondary | ICD-10-CM
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
## 2016-05-29 ENCOUNTER — Ambulatory Visit (INDEPENDENT_AMBULATORY_CARE_PROVIDER_SITE_OTHER): Payer: BLUE CROSS/BLUE SHIELD | Admitting: Psychology

## 2016-05-29 DIAGNOSIS — F329 Major depressive disorder, single episode, unspecified: Secondary | ICD-10-CM

## 2016-05-29 DIAGNOSIS — F32A Depression, unspecified: Secondary | ICD-10-CM

## 2016-05-29 DIAGNOSIS — R4189 Other symptoms and signs involving cognitive functions and awareness: Secondary | ICD-10-CM

## 2016-05-29 NOTE — Progress Notes (Signed)
   Neuropsychology Note  [name] returned today for 4 hours of neuropsychological testing with technician, Milana Kidney, BS, under the supervision of Dr. Macarthur Critchley. The patient did not appear overtly distressed by the testing session, per behavioral observation or via self-report to the technician. Rest breaks were offered. Lisa Odom will return within 2 weeks for a feedback session with Dr. Si Raider at which time her test performances, clinical impressions and treatment recommendations will be reviewed in detail. The patient understands she can contact our office should she require our assistance before this time.  Full report to follow.
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
## 2016-06-09 ENCOUNTER — Encounter: Payer: Self-pay | Admitting: Psychology

## 2016-06-09 ENCOUNTER — Ambulatory Visit (INDEPENDENT_AMBULATORY_CARE_PROVIDER_SITE_OTHER): Payer: BLUE CROSS/BLUE SHIELD | Admitting: Psychology

## 2016-06-09 DIAGNOSIS — R4189 Other symptoms and signs involving cognitive functions and awareness: Secondary | ICD-10-CM

## 2016-06-09 DIAGNOSIS — F329 Major depressive disorder, single episode, unspecified: Secondary | ICD-10-CM

## 2016-06-09 DIAGNOSIS — F32A Depression, unspecified: Secondary | ICD-10-CM

## 2016-06-09 NOTE — Patient Instructions (Signed)
Psychological testing revealed significant depression and anxiety. It is most likely the case that your cognitive symptoms in daily life are due to depression and anxiety (see below). You should continue to see your mental health providers regularly for treatment.  The effect of depression and anxiety on your cognitive functioning: . One of the typical symptoms of depression is difficulty concentrating and making decisions, and various types of anxiety also interfere with attention and concentration . Problems with attention and concentration can disrupt the process of learning and making new memories, which can make it seem like there is a problem with your memory. In your daily life, you may experience this disruption as forgetting names and appointments, misplacing items, and needing to make lists for shopping and errands. It may be harder for you to stay focused on tasks and feel as "sharp" as you did in the past.  . Also, when we are depressed or anxious, we often pay more attention to our difficulties (rather than our strengths) in our daily life, and this can make it seem to [place] like we are doing worse cognitively than we really are. . The cognitive aspects of depression and anxiety are sometimes observed as an identifiable pattern of poor performance on a neuropsychological evaluation, but it is also possible that all scores on an evaluation are within normal limits. . Regardless of the test scores, distress related to depression and anxiety can interfere with the ability to make use of your cognitive resources and function optimally across settings such as work or school, maintaining the home and responsibilities, and personal relationships. . Fortunately, there are treatments for depression and anxiety, and when mood improves, cognitive functioning in daily life often improves. . Treatment options include psychotherapy, medications (e.g., antidepressants), and behavioral changes, such as increasing  your involvement in enjoyable activities, increasing the amount of exercise you are getting, and maintaining a regular routine.

## 2016-06-13 ENCOUNTER — Encounter: Payer: Self-pay | Admitting: Family Medicine

## 2016-06-13 ENCOUNTER — Ambulatory Visit (INDEPENDENT_AMBULATORY_CARE_PROVIDER_SITE_OTHER): Payer: BLUE CROSS/BLUE SHIELD | Admitting: Family Medicine

## 2016-06-13 ENCOUNTER — Ambulatory Visit (INDEPENDENT_AMBULATORY_CARE_PROVIDER_SITE_OTHER)
Admission: RE | Admit: 2016-06-13 | Discharge: 2016-06-13 | Disposition: A | Discharge status: Home / Self Care | Payer: BLUE CROSS/BLUE SHIELD | Source: Ambulatory Visit | Attending: Family Medicine | Admitting: Family Medicine

## 2016-06-13 VITALS — BP 132/93 | HR 98 | Temp 97.9°F | Ht 61.5 in | Wt 139.8 lb

## 2016-06-13 DIAGNOSIS — M21611 Bunion of right foot: Secondary | ICD-10-CM | POA: Insufficient documentation

## 2016-06-13 DIAGNOSIS — B351 Tinea unguium: Secondary | ICD-10-CM

## 2016-06-13 DIAGNOSIS — Z23 Encounter for immunization: Secondary | ICD-10-CM

## 2016-06-13 DIAGNOSIS — M5442 Lumbago with sciatica, left side: Secondary | ICD-10-CM | POA: Insufficient documentation

## 2016-06-13 DIAGNOSIS — M21612 Bunion of left foot: Secondary | ICD-10-CM | POA: Insufficient documentation

## 2016-06-13 MED ORDER — PREDNISONE 20 MG PO TABS: ORAL_TABLET | ORAL | 0 refills | Status: DC

## 2016-06-13 MED ORDER — CICLOPIROX 8 % EX SOLN: Freq: Every day | CUTANEOUS | 0 refills | Status: DC

## 2016-06-13 NOTE — Assessment & Plan Note (Signed)
Eval with X-ray given vertebral ttp and bilateral chronic pain . 3 months.  Treat with steroid ( hold NSAID).  Start home stretches.  Pt may be good option if not improving.

## 2016-06-13 NOTE — Assessment & Plan Note (Signed)
Referral made 

## 2016-06-13 NOTE — Patient Instructions (Addendum)
Stop at front to set up referral if interested.  Start daily penlac lacquer.  We will call with X-ray.  Hold celebrex while on prednisone.  Start prednisone taper.  Follow up  if low back not improving as expected in 2 weeks.

## 2016-06-13 NOTE — Progress Notes (Signed)
Subjective:    Patient ID: Lisa Odom, female    DOB: 07-22-1956, 60 y.o.   MRN: [id number]  HPI   60 year old female complicated pt presents for multiple issues.  Recent neuro referral. Neuro cog testing.. Felt that  Cognitive impairment symptoms were likely due to her mood.  She is getting out more. She is feeling better with her mood.  She is grateful she does not have dementia.   1. Bunions..  Tender.. redness. Needs podiatry referral to treat   2. Sciatica, seen in ER in April. Started on left, now moved to right as well. Radiates down lateral left leg.  Occ sharp grabbing pains at time. tyelnol helps a little. No specific fall or known injury.  Calf of left leg painful at time.  No current weakness or numbness.  Uses celebrex for pain.  Never use hydrocodone for pain.  Tramadol but does not help much.   3. Insomnia  4.shoulder pain: followed by ortho  5.thumb pain  Pt told ability to treat/eval one-two issue today. Will discuss other issues at a later time.       Review of Systems  Constitutional: Negative for fatigue and fever.  HENT: Negative for ear pain.   Eyes: Negative for pain.  Respiratory: Negative for chest tightness and shortness of breath.   Cardiovascular: Negative for chest pain, palpitations and leg swelling.  Gastrointestinal: Negative for abdominal pain.  Genitourinary: Negative for dysuria.      Objective:   Physical Exam  Constitutional: Vital signs are normal. She appears well-developed and well-nourished. She is cooperative.  Non-toxic appearance. She does not appear ill. No distress.  HENT:  Head: Normocephalic.  Right Ear: Hearing, tympanic membrane, external ear and ear canal normal. Tympanic membrane is not erythematous, not retracted and not bulging.  Left Ear: Hearing, tympanic membrane, external ear and ear canal normal. Tympanic membrane is not erythematous, not retracted and not bulging.  Nose: No mucosal edema or  rhinorrhea. Right sinus exhibits no maxillary sinus tenderness and no frontal sinus tenderness. Left sinus exhibits no maxillary sinus tenderness and no frontal sinus tenderness.  Mouth/Throat: Uvula is midline, oropharynx is clear and moist and mucous membranes are normal.  Eyes: Conjunctivae, EOM and lids are normal. Pupils are equal, round, and reactive to light. Lids are everted and swept, no foreign bodies found.  Neck: Trachea normal and normal range of motion. Neck supple. Carotid bruit is not present. No thyroid mass and no thyromegaly present.  Cardiovascular: Normal rate, regular rhythm, S1 normal, S2 normal, normal heart sounds, intact distal pulses and normal pulses.  Exam reveals no gallop and no friction rub.   No murmur heard. Pulmonary/Chest: Effort normal and breath sounds normal. No tachypnea. No respiratory distress. She has no decreased breath sounds. She has no wheezes. She has no rhonchi. She has no rales.  Abdominal: Soft. Normal appearance and bowel sounds are normal. There is no tenderness.  Musculoskeletal:       Lumbar back: She exhibits decreased range of motion, tenderness and bony tenderness. She exhibits no swelling.  Histrionic about exam, leaping off table with my soft touch, unusual facial expressions.  Bilateral positive SLR, left greater the irght.  Neurological: She is alert. She has normal strength. No sensory deficit. Gait normal.  Skin: Skin is warm, dry and intact. No rash noted.  Psychiatric: Her speech is normal and behavior is normal. Judgment and thought content normal. Her mood appears not anxious. Cognition and memory  are normal. She does not exhibit a depressed mood.   Smiling very happy, almost inappropriate laughter          Assessment & Plan:

## 2016-06-13 NOTE — Assessment & Plan Note (Signed)
Recommend avoiding oral antifungals.  Treat with penlac topical.

## 2016-06-13 NOTE — Progress Notes (Signed)
Pre visit review using our clinic review tool, if applicable. No additional management support is needed unless otherwise documented below in the visit note. 

## 2016-06-16 ENCOUNTER — Ambulatory Visit (INDEPENDENT_AMBULATORY_CARE_PROVIDER_SITE_OTHER): Payer: BLUE CROSS/BLUE SHIELD | Admitting: Neurology

## 2016-06-16 ENCOUNTER — Encounter: Payer: Self-pay | Admitting: Neurology

## 2016-06-16 VITALS — BP 110/70 | HR 98 | Ht 61.5 in | Wt 140.4 lb

## 2016-06-16 DIAGNOSIS — R4189 Other symptoms and signs involving cognitive functions and awareness: Secondary | ICD-10-CM | POA: Diagnosis not present

## 2016-06-16 DIAGNOSIS — F332 Major depressive disorder, recurrent severe without psychotic features: Secondary | ICD-10-CM | POA: Diagnosis not present

## 2016-06-16 NOTE — Progress Notes (Signed)
Follow-up Visit   Date: 06/16/16    Lisa Odom MRN: [id number] DOB: 13-Jan-1956   Interim History: Lisa Odom is a 60 y.o. right-handed African American female with hypertension, well-controlled diabetes mellitus, severe depression (followed by Dr. Toy Care), right CTS release, cervical spinal stenosis s/p anterior cervical decompression (08/2014) returning to the clinic for memory loss.  The patient was accompanied to the clinic by sister who also provides collateral information.    History of present illness: Starting in 2013, she developed burning sensation of the feet and lower legs which ultimately led to her diagnosis of neuropathy.  She takes gabapentin 300/300/600 for burning pain which significantly helps her feet pain.  About 35-month ago, she started burning in her hands, arms, and stomach.  She reports having hypersensitivity to clothing or light pressure.  Nothing seems to help her pain, except sleeping.  She feels weak and endorses falling a few times.   She also complains of whole body pain for the past three years which achy, described as throbbing pain of her bones.  Pain is constant and worse with weather changes.  She has tried Aleve without any relief.  Recently evaluated by her PCP with thorough work-up (see below) and exam notable for 18/18 trigger points consistent with fibromyalgia.   She also complains of dizziness, described as near passing out when she gets up too quickly or rolls in bed.  Denies room spinning sensation.  She has no passed out.    Significant psychiatric history of severe depression.  She sleeps every other night. She reports crying a lot at night.  She started seeing counselor and psychiatrist.  She is planning on moving in with her sister. She has been out of work since 11/29/2014 for psychiatric issues.  She has previously seen neurology (Dr. LMelton Alar for migraines. She reports never having a medication that worked.    UPDATE  05/20/2016:   Patient is here with new complaints of memory impairment which started after her neck surgery in 2015.  Her sister recalls that she was acting very bizarre during her hospitalization and has never returned to herself.  She moved in with her sister in July 2016.  She does her laundry, but her sister says that she cannot comprehend how to change the water temperature and asks if she needs to takes hot water from the sink.  She does not always fold her clothes and "hides" them.  Patient does not help with cooking or household chores.  She sometimes will help take the trash out or with washing the dishes about once per month.  Her nephew had noticed on one occasion that she was sitting under a blanket and randomly threw the remote control in the room, which alarmed everyone because it was unprovoked.  She was checked to see that "it worked".  Her sister and patient reports that her memory can be good some day, but terrible on other.  Her bad days are mostly when her pain is severe.  Her sister thinks she may be taking extra medication these days, but patient denies this. There has not been a gradual or progressive decline in her memory.  Her sister try to allow patient to do as much as she can to maintain her independence.  She manages her own medication and finances.  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  She continues to have  pain related to fibromyaglia.  She only sleeps about being sleep deprived for 8-10 days, then she is able to sleep. She drives occasionally on her good days, but feels that she gets lost easily.    UPDATE 06/16/2016:  Her neuropsychological testing showed cognitive impairment due to major depressive disorder.  She will be seeing her psychiatrist next month to discuss medications.  Her sister feels that the number of medications may also be contributing to her memory, which is also likely since she  is taking 6 medications with potential cognitive side effects.  She has no new complaints today.   Medications:  Current Outpatient Prescriptions on File Prior to Visit  Medication Sig Dispense Refill  . ALPRAZolam (XANAX) 0.25 MG tablet TAKE 1-2 TABLETS BY MOUTH TWICE A DAY  2  . amphetamine-dextroamphetamine (ADDERALL) 30 MG tablet Take 1 tablet by mouth 2 (two) times daily.  0  . aspirin 81 MG chewable tablet Chew by mouth daily.    Marland Kitchen b complex vitamins capsule Take 1 capsule by mouth every other day.     Marland Kitchen buPROPion (WELLBUTRIN XL) 150 MG 24 hr tablet Take 150 mg by mouth daily.  2  . celecoxib (CELEBREX) 200 MG capsule Take 1 capsule (200 mg total) by mouth daily. 90 capsule 1  . ciclopirox (PENLAC) 8 % solution Apply topically at bedtime. Apply over nail and surrounding skin. Apply daily over previous coat. After seven (7) days, may remove with alcohol and continue cycle. 6.6 mL 0  . DULoxetine (CYMBALTA) 60 MG capsule TAKE 2 CAPSULES (120 MG TOTAL) BY MOUTH DAILY. 60 capsule 5  . gabapentin (NEURONTIN) 600 MG tablet TAKE 1/2 TABLET TWICE DAILY AND 2 TABS AT BEDTIME  10  . glucose blood (ONE TOUCH ULTRA TEST) test strip Use to check blood sugar daily.  Dx: 250.60 100 each 3  . Multiple Vitamin (MULTIVITAMIN) tablet Take 1 tablet by mouth daily.    Glory Rosebush DELICA LANCETS 29H MISC Use to check blood sugar daily.  Dx: E11.49 100 each 3  . pantoprazole (PROTONIX) 40 MG tablet Take 1 tablet (40 mg total) by mouth daily. 90 tablet 3  . predniSONE (DELTASONE) 20 MG tablet 3 tabs by mouth daily x 3 days, then 2 tabs by mouth daily x 2 days then 1 tab by mouth daily x 2 days 15 tablet 0  . traMADol (ULTRAM) 50 MG tablet Take 50 mg by mouth every 8 (eight) hours as needed.  0   No current facility-administered medications on file prior to visit.     Allergies:  Allergies  Allergen Reactions  . Sulfonamide Derivatives     REACTION: Rash  . Hydrochlorothiazide Rash    Review of Systems:    CONSTITUTIONAL: No fevers, chills, night sweats, or weight loss.  EYES: No visual changes or eye pain ENT: No hearing changes.  No history of nose bleeds.   RESPIRATORY: No cough, wheezing and shortness of breath.   CARDIOVASCULAR: Negative for chest pain, and palpitations.   GI: Negative for abdominal discomfort, blood in stools or black stools.  No recent change in bowel habits.   GU:  No history of incontinence.   MUSCLOSKELETAL: No history of joint pain or swelling.  No myalgias.   SKIN: Negative for lesions, rash, and itching.   ENDOCRINE: Negative for cold or heat intolerance, polydipsia or goiter.   PSYCH:  ++ depression or anxiety symptoms.   NEURO: As Above.   Vital Signs:  BP 110/70  Pulse 98   Ht 5' 1.5" (1.562 m)   Wt 140 lb 6 oz (63.7 kg)   SpO2 98%   BMI 26.09 kg/m   Neurological Exam: MENTAL STATUS including orientation to time, place, person, recent and remote memory is fair.  She has poor attention and appears distracted easily.  Speech is not dysarthric. Montreal Cognitive Assessment  05/20/2016  Visuospatial/ Executive (0/5) 4  Naming (0/3) 3  Attention: Read list of digits (0/2) 1  Attention: Read list of letters (0/1) 1  Attention: Serial 7 subtraction starting at 100 (0/3) 1  Language: Repeat phrase (0/2) 1  Language : Fluency (0/1) 0  Abstraction (0/2) 1  Delayed Recall (0/5) 2  Orientation (0/6) 6  Total 20  Adjusted Score (based on education) 20  Some recent data might be hidden    CRANIAL NERVES: Pupils equal round and reactive to light. Normal conjugate, extra-ocular eye movements in all directions of gaze. She blinks and frowns when testing pupillary response. Visual fields are intact.  No ptosis.   Face is symmetric. Palate elevates symmetrically.  Tongue is midline.  MOTOR:  Motor strength is antigravity in all extremities, she does not give full effort to grade strength.    SENSORY:  Intact to vibration throughout.  COORDINATION/GAIT:    Gait narrow based and stable.   Data: MRI cervical spine 08/27/2014:  Multilevel advanced spondylosis causing spinal stenosis and foraminal stenosis at multiple levels. This is most severe on the right at C4-5.  Labs 07/2014:  ANA neg, CK 178*, HbA1c 6.4, Lyme neg, ESR 34, TSH 1.880, vitamin D 24.7*  NCS/EMG of the right arm and leg 03/20/2015:   This is a normal study of the right upper and lower extremities.   In particular, there is no evidence of a generalized sensorimotor polyneuropathy, cervical/lumbosacral radiculopathy, diffuse myopathy, or carpal tunnel syndrome.  MRI brain wwo contrast 04/18/2016:  Normal appearance of the brain.  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  Lab Results  Component Value Date   TSH 0.74 05/20/2016   Lab Results  Component Value Date   [id number] 05/20/2016   Neuropsychological testing 06/09/2016: Major depressive disorder. Rule out somatization disorder, Rule out schizoaffective disorder.    IMPRESSION/PLAN: 1.  Cognitive dysfunction likely due to underlying mood disorder, insomnia, and/or polypharmacy.   - Formal neuropsychological testing showed major depressive disorder, r/o somatization  - Recommend follow-up with psychiatrist and counselor to address depression  - Discuss whether her medications for pain can be adjusted to keep her on the lowest effective dose to minimize cognitive side effects  - MRI brain was reviewed small Ratke's cyst. Patient reassured that Rathke cleft cysts are typically benign, asymptomatic lesions. She denies any vision loss and her headaches have not changes in character, so this is most likely incidental.    - Precautions with driving were stressed and patient has self-restricted driving on days when she feels particularly impaired  - Emphasized that she engage in activities that are mentally stimulating  - Start water exercises to increase physical activity as this will be less painful  than land exercises  2.  Severe depression, followed by Dr. Toy Care and also seeing a counselor  3.  Fibromyalgia and whole body pain, followed by PCP  - NCS/EMG of the right arm did not show evidence of neuropathy or myopathy  4.  ?Somatization as noted on her neuropsychological testing and nonphysiological exam.     The duration of  this appointment visit was 20 minutes of face-to-face time with the patient.  Greater than 50% of this time was spent in counseling, explanation of diagnosis, planning of further management, and coordination of care.   Thank you for allowing me to participate in patient's care.  If I can answer any additional questions, I would be pleased to do so.    Sincerely,    Brelyn Woehl K. Posey Pronto, DO

## 2016-06-16 NOTE — Patient Instructions (Signed)
1.  Follow-up with your psychiatrist regarding depression causing memory changes 2.  Discuss with your prescribing providers about reducing the dose of any medications that have the potential to cause cognitive side effects 3.  Start water exercises 4.  Engage in mentally stimulating activities

## 2016-07-08 ENCOUNTER — Ambulatory Visit (INDEPENDENT_AMBULATORY_CARE_PROVIDER_SITE_OTHER): Payer: BLUE CROSS/BLUE SHIELD | Admitting: Podiatry

## 2016-07-08 ENCOUNTER — Encounter: Payer: Self-pay | Admitting: Podiatry

## 2016-07-08 ENCOUNTER — Ambulatory Visit (INDEPENDENT_AMBULATORY_CARE_PROVIDER_SITE_OTHER): Payer: BLUE CROSS/BLUE SHIELD

## 2016-07-08 VITALS — BP 149/88 | HR 91 | Resp 16

## 2016-07-08 DIAGNOSIS — M2010 Hallux valgus (acquired), unspecified foot: Secondary | ICD-10-CM

## 2016-07-08 DIAGNOSIS — L603 Nail dystrophy: Secondary | ICD-10-CM | POA: Diagnosis not present

## 2016-07-08 DIAGNOSIS — M2042 Other hammer toe(s) (acquired), left foot: Secondary | ICD-10-CM

## 2016-07-08 DIAGNOSIS — M2041 Other hammer toe(s) (acquired), right foot: Secondary | ICD-10-CM | POA: Diagnosis not present

## 2016-07-08 NOTE — Progress Notes (Signed)
   Subjective:    Patient ID: Lisa Odom, female    DOB: 08-03-1956, 60 y.o.   MRN: [id number]  HPI:she presents today as known patient with a chief complaint of painful bunions bilaterally. She states the left one seems to be worse in the right is also concerned about the deformity of the second digit bilaterally. States that currently her neuropathy is controlled with gabapentin. She also states that her diabetes is under good control. Concerned about the discoloration of the toenails as well. She states the bunions are limiting her ability to perform her daily activities and the toes seem to be curling and causing more pain in her feet.    Review of Systems  Musculoskeletal: Positive for arthralgias.  All other systems reviewed and are negative.      Objective:   Physical Exam: Vital signs are stable she is alert and oriented 3. Pulses are palpable. Neurologic sensorium slightly diminished per Semmes-Weinstein monofilament secondary to diabetic peripheral neuropathy. Deep tendon reflexes are intact bilateral muscle strength is bilaterally symmetrical. Orthopedic evaluation demonstrates moderate to severe hallux abductovalgus deformities with pain on palpation and range of motion of the first metatarsophalangeal joint. Hammertoe deformities to the second digits bilateral  Flexible from the metatarsophalangeal joint to the DIPJ.. Radiographic evaluation does demonstrate moderate to severe hallux abductovalgus deformity is with dislocation of first metatarsophalangeal joints bilaterally. Left appears to be worse than the right. Cutaneous evaluation demonstrates supple hydrated cutis nails do appear to be slightly thickened possibly destroyed but cannot rule out onychomycosis.          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 The 42nd digit bilateral. Possible nail dystrophy bilateral.  Plan: We discussed the etiology pathology conservative  versus surgical therapies. Samples of the nail were taken today to be sent for pathologic evaluation. We consented her for surgical intervention regarding an [facility] bunion repair and a hammertoe repair. We discussed the pros and cons of this type of procedure including the possible postop complications which may include but are not limited to first obtain bleeding swelling infection recurrence need for further surgery overcorrection under correction loss of digit loss of limb loss of life. She is on the patient the consent form and we distributed a Dispensing optician for her postop recovery.Marland Kitchen

## 2016-07-08 NOTE — Patient Instructions (Signed)
Pre-Operative Instructions  Congratulations, you have decided to take an important step to improving your quality of life.  You can be assured that the doctors of [facility] will be with you every step of the way.  1. Plan to be at the surgery center/hospital at least 1 (one) hour prior to your scheduled time unless otherwise directed by the surgical center/hospital staff.  You must have a responsible adult accompany you, remain during the surgery and drive you home.  Make sure you have directions to the surgical center/hospital and know how to get there on time. 2. For hospital based surgery you will need to obtain a history and physical form from your family physician within 1 month prior to the date of surgery- we will give you a form for you primary physician.  3. We make every effort to accommodate the date you request for surgery.  There are however, times where surgery dates or times have to be moved.  We will contact you as soon as possible if a change in schedule is required.   4. No Aspirin/Ibuprofen for one week before surgery.  If you are on aspirin, any non-steroidal anti-inflammatory medications (Mobic, Aleve, Ibuprofen) you should stop taking it 7 days prior to your surgery.  You make take Tylenol  For pain prior to surgery.  5. Medications- If you are taking daily heart and blood pressure medications, seizure, reflux, allergy, asthma, anxiety, pain or diabetes medications, make sure the surgery center/hospital is aware before the day of surgery so they may notify you which medications to take or avoid the day of surgery. 6. No food or drink after midnight the night before surgery unless directed otherwise by surgical center/hospital staff. 7. No alcoholic beverages 24 hours prior to surgery.  No smoking 24 hours prior to or 24 hours after surgery. 8. Wear loose pants or shorts- loose enough to fit over bandages, boots, and casts. 9. No slip on shoes, sneakers are best. 10. Bring  your boot with you to the surgery center/hospital.  Also bring crutches or a walker if your physician has prescribed it for you.  If you do not have this equipment, it will be provided for you after surgery. 11. If you have not been contracted by the surgery center/hospital by the day before your surgery, call to confirm the date and time of your surgery. 12. Leave-time from work may vary depending on the type of surgery you have.  Appropriate arrangements should be made prior to surgery with your employer. 13. Prescriptions will be provided immediately following surgery by your doctor.  Have these filled as soon as possible after surgery and take the medication as directed. 14. Remove nail polish on the operative foot. 15. Wash the night before surgery.  The night before surgery wash the foot and leg well with the antibacterial soap provided and water paying special attention to beneath the toenails and in between the toes.  Rinse thoroughly with water and dry well with a towel.  Perform this wash unless told not to do so by your physician.  Enclosed: 1 Ice pack (please put in freezer the night before surgery)   1 Hibiclens skin cleaner   Pre-op Instructions  If you have any questions regarding the instructions, do not hesitate to call our office.  [place]: [address] [place], [place] [address] [phone]  [place]: [address] [place], [place] [address] [phone]  [place]: [address]  [place], [place] [address] [phone]   Dr.   Norman Regal DPM, Dr. Matthew Wagoner DPM, Dr. M. Todd Kelvin Burpee DPM, Dr. Titorya Stover DPM 

## 2016-08-07 ENCOUNTER — Telehealth: Payer: Self-pay | Admitting: *Deleted

## 2016-08-07 NOTE — Telephone Encounter (Addendum)
-----   Message from Garrel Ridgel, [place] sent at 08/05/2016  5:12 PM EST ----- LET HER KNOW THAT THERE IS NO FUNGUS.  CHRONIC MICRO TRAUMA AND NAIL DYSTROPHY. 08/07/2016-Informed pt dr. Stephenie Acres review of results.

## 2016-08-28 ENCOUNTER — Other Ambulatory Visit: Payer: Self-pay | Admitting: Podiatry

## 2016-08-28 MED ORDER — OXYCODONE-ACETAMINOPHEN 10-325 MG PO TABS: ORAL_TABLET | ORAL | 0 refills | Status: DC

## 2016-08-28 MED ORDER — CEPHALEXIN 500 MG PO CAPS
500.0000 mg | ORAL_CAPSULE | Freq: Three times a day (TID) | ORAL | 0 refills | Status: DC
Start: 2016-08-28 — End: 2016-11-14

## 2016-08-28 MED ORDER — PROMETHAZINE HCL 25 MG PO TABS: 25.0000 mg | ORAL_TABLET | Freq: Three times a day (TID) | ORAL | 0 refills | Status: DC | PRN

## 2016-08-29 ENCOUNTER — Encounter: Payer: Self-pay | Admitting: Podiatry

## 2016-08-29 DIAGNOSIS — M2012 Hallux valgus (acquired), left foot: Secondary | ICD-10-CM | POA: Diagnosis not present

## 2016-08-29 DIAGNOSIS — M2042 Other hammer toe(s) (acquired), left foot: Secondary | ICD-10-CM | POA: Diagnosis not present

## 2016-09-02 ENCOUNTER — Telehealth: Payer: Self-pay

## 2016-09-02 ENCOUNTER — Telehealth: Payer: Self-pay | Admitting: Family Medicine

## 2016-09-02 DIAGNOSIS — M797 Fibromyalgia: Secondary | ICD-10-CM

## 2016-09-02 NOTE — Telephone Encounter (Signed)
Pt would like to see a neurologist for neuropathy and fibromyalgia.  She wants to see Dr Haywood Pao , she does not need a referral for the insurance company, but needs one for the specialist  Fax number is [phone]

## 2016-09-02 NOTE — Telephone Encounter (Signed)
I am confused.. Dr. Estanislado Pandy is a rheumatologist I belive

## 2016-09-02 NOTE — Telephone Encounter (Signed)
Spoke with pt regarding post op status.She states that she is doing well, managing pain effectively with minmal pain med use. She denies fever, chills, or nausea. She is to call with any questions, concerns or acute status changes and remain in Cam boot with minimal weight bearing until her post op appt

## 2016-09-03 NOTE — Telephone Encounter (Signed)
Ms. Espeland notified as instructed by telephone.  While on the phone she states she got an email from her insurance company that states she needs a urine checked.  Did not specify what test was needed.  She is past due for a microalbumin per her Health Maintenance.  Please advise.  Does patient need office visit or can she just come in for labs to do the urine check.

## 2016-09-03 NOTE — Telephone Encounter (Signed)
Ms. Hayenga states she requested to see Dr. Dora Sims, the rheumatologist.

## 2016-09-03 NOTE — Telephone Encounter (Signed)
Let pt know referral sent. 

## 2016-09-04 ENCOUNTER — Ambulatory Visit (INDEPENDENT_AMBULATORY_CARE_PROVIDER_SITE_OTHER): Payer: BLUE CROSS/BLUE SHIELD

## 2016-09-04 ENCOUNTER — Ambulatory Visit (INDEPENDENT_AMBULATORY_CARE_PROVIDER_SITE_OTHER): Payer: BLUE CROSS/BLUE SHIELD | Admitting: Podiatry

## 2016-09-04 DIAGNOSIS — M2010 Hallux valgus (acquired), unspecified foot: Secondary | ICD-10-CM

## 2016-09-04 DIAGNOSIS — M2041 Other hammer toe(s) (acquired), right foot: Secondary | ICD-10-CM

## 2016-09-04 DIAGNOSIS — M2042 Other hammer toe(s) (acquired), left foot: Secondary | ICD-10-CM | POA: Diagnosis not present

## 2016-09-04 NOTE — Telephone Encounter (Signed)
Lisa Odom notified as instructed by telephone.  She will call back to schedule lab visit for a microalbumin only.

## 2016-09-04 NOTE — Telephone Encounter (Signed)
Lab only needed for microalbumin

## 2016-09-05 ENCOUNTER — Telehealth: Payer: Self-pay | Admitting: Family Medicine

## 2016-09-05 NOTE — Telephone Encounter (Signed)
Called Dr Anette Guarneri office and she doesn't see patients with fibromyalgia at all. Will you please place a New referral for joint pain, pain in hands and knee pain and maybe we can refer her to [facility], noone will see patients for fibromyalgia except Pain Clinics.

## 2016-09-07 NOTE — Progress Notes (Signed)
She presents today for follow-up date of surgery first of December 2017. [place] bunionectomy left hammertoe repair second left. She states that she is doing well.  Objective: Vital signs are stable she is alert and oriented 3. Pulses are palpable. Dry sterile dressing intact was removed demonstrates no erythema mild edema no cellulitis drainage or odor.  Assessment: Well-healing surgical foot 1 week.  Plan: Redressed today drastic compressive dressing continue elevation offloading and continue to keep it dry. She will continue to wear the Cam Walker and I will follow-up with her in 1 week at which time we will remove sutures place her in a compression anklet and a Darco shoe.

## 2016-09-09 ENCOUNTER — Telehealth: Payer: Self-pay

## 2016-09-09 NOTE — Telephone Encounter (Signed)
Let pt know about Dr. Estanislado Pandy.. Does she still want a referral?

## 2016-09-09 NOTE — Telephone Encounter (Signed)
Ms. Kanaan notified that Dr. Anette Guarneri does not see patient for fibromyalgia.  She states she does not want to be referred to anyone else at this time.  She states she will continue doing what she is doing.  Will call back if she changes her mind.

## 2016-09-09 NOTE — Telephone Encounter (Signed)
Sherise with [facility] left v/m; had referral for PT eval and pt refused to have PT services.

## 2016-09-11 ENCOUNTER — Ambulatory Visit (INDEPENDENT_AMBULATORY_CARE_PROVIDER_SITE_OTHER): Payer: BLUE CROSS/BLUE SHIELD | Admitting: Podiatry

## 2016-09-11 DIAGNOSIS — Z9889 Other specified postprocedural states: Secondary | ICD-10-CM

## 2016-09-11 DIAGNOSIS — M2010 Hallux valgus (acquired), unspecified foot: Secondary | ICD-10-CM

## 2016-09-12 ENCOUNTER — Ambulatory Visit (INDEPENDENT_AMBULATORY_CARE_PROVIDER_SITE_OTHER): Payer: Self-pay

## 2016-09-12 ENCOUNTER — Telehealth: Payer: Self-pay | Admitting: *Deleted

## 2016-09-12 VITALS — Temp 98.1°F

## 2016-09-12 DIAGNOSIS — M2010 Hallux valgus (acquired), unspecified foot: Secondary | ICD-10-CM

## 2016-09-12 NOTE — Progress Notes (Signed)
Pt presents stating that her incision has opened some and was draining since her routine post op visit yesterday.    Noted 2nd metatarsal incision to have separated slightly, some scabbing noted in between incision lines. No drainage present, no erythema or extraordinary swelling to area, no warmth or any other s/s of infection noted. Patient is a febrile and denies other s/s of infection. All other post operative incisions were intact and healing well. Applied steri-strips to toe. Advised on s/s of infection and importance of keeping foot dry until her appt next week. Re-appointed her to follow up with Dr Milinda Pointer next week for further evaluation.

## 2016-09-12 NOTE — Telephone Encounter (Signed)
Pt states she has her sutures removed yesterday, hammer toe is now gapping and shiny and looks oozy. I told pt to come on in to be evaluated by on the nurses schedule.

## 2016-09-12 NOTE — Progress Notes (Signed)
DOS 12.01.2017 [place] Bunion Repair with Screw Left Foot; Hammertoe Repair 2nd Toe Left Foot

## 2016-09-14 NOTE — Progress Notes (Signed)
She presents today 2 weeks status post [place] bunion repair left foot hammertoe repair second left. She states that she's doing just fine without complications. Denies chest pain shortness of breath pain in her calf.  Objective: Vascular dressing was removed today demonstrates no erythema cellulitis drainage or odor sutures are intact March is well coapted margins remain well coapted after sutures were removed.  Assessment: Well-healing surgical foot 2 weeks status post bunion repair.  Plan: Placed in a compression anklet today with a Darco shoe will follow up with her in 2 weeks.

## 2016-09-15 ENCOUNTER — Encounter: Payer: Self-pay | Admitting: Podiatry

## 2016-09-16 ENCOUNTER — Ambulatory Visit: Payer: BLUE CROSS/BLUE SHIELD

## 2016-09-16 ENCOUNTER — Ambulatory Visit (INDEPENDENT_AMBULATORY_CARE_PROVIDER_SITE_OTHER): Payer: Self-pay | Admitting: Podiatry

## 2016-09-16 ENCOUNTER — Encounter: Payer: Self-pay | Admitting: Podiatry

## 2016-09-16 DIAGNOSIS — M2010 Hallux valgus (acquired), unspecified foot: Secondary | ICD-10-CM

## 2016-09-17 NOTE — Progress Notes (Signed)
Lisa Odom presents today date of surgery 08/29/2016 states that after the suture removal as gapping open of the toe she was seen this past Friday with this concern. She states it seems to be drying up now and looks better.  Objective: Vital signs are stable she is alert and oriented 3 no erythema edema cellulitis treated at her some mild dehiscence of the wounds including the bunion wound on the left foot and the second toe. But at this point I see no signs of infection.  Assessment: Well-healing surgical foot right.  Plan: I encouraged her to rapid daily and will allow her to start washing getting wet out follow up with her 2 weeks

## 2016-09-18 ENCOUNTER — Encounter: Payer: Self-pay | Admitting: Family Medicine

## 2016-09-19 ENCOUNTER — Other Ambulatory Visit: Payer: BLUE CROSS/BLUE SHIELD

## 2016-09-19 ENCOUNTER — Telehealth: Payer: Self-pay | Admitting: *Deleted

## 2016-09-19 DIAGNOSIS — E1349 Other specified diabetes mellitus with other diabetic neurological complication: Secondary | ICD-10-CM

## 2016-09-19 NOTE — Telephone Encounter (Signed)
Pt came in office and [place] is needing a urine sample to check protein. Patient states she talked to Butch Penny about coming to the office to drop off a urine sample. Patient is here and needs orders to be put in EPIC. See last telephone note 09/04/16. Thank you

## 2016-09-19 NOTE — Telephone Encounter (Signed)
Saw the phone note from 12.5.17, ordered micro/ alb creat ratio

## 2016-09-20 LAB — MICROALBUMIN / CREATININE URINE RATIO
CREATININE, URINE: 309 mg/dL (ref 20–320)
MICROALB/CREAT RATIO: 13 ug/mg{creat} (ref <30)
Microalb, Ur: 4 mg/dL

## 2016-09-30 ENCOUNTER — Ambulatory Visit (INDEPENDENT_AMBULATORY_CARE_PROVIDER_SITE_OTHER): Payer: BLUE CROSS/BLUE SHIELD

## 2016-09-30 ENCOUNTER — Ambulatory Visit (INDEPENDENT_AMBULATORY_CARE_PROVIDER_SITE_OTHER): Payer: Self-pay | Admitting: Podiatry

## 2016-09-30 DIAGNOSIS — M2010 Hallux valgus (acquired), unspecified foot: Secondary | ICD-10-CM

## 2016-09-30 DIAGNOSIS — M2042 Other hammer toe(s) (acquired), left foot: Secondary | ICD-10-CM

## 2016-09-30 DIAGNOSIS — M2041 Other hammer toe(s) (acquired), right foot: Secondary | ICD-10-CM

## 2016-10-01 NOTE — Progress Notes (Signed)
She presents today for postop visit date of surgeries 08/29/2016 as post [facility] bunion repair left foot and hammertoe repair second digit left foot. She states that the toe looks a little bit turned. She's happy with the reduction that she has received and encouraged by the progress. She denies chest pain or shortness of breath.  Objective: Vital signs are stable she is alert and oriented 3 she has great range of motion of both the first and second metatarsophalangeal joints. Radiographs demonstrate no changes in structure or position.  Assessment: Nonsurgical foot.  Plan: I will allow her to start getting back into a pair of tennis shoes. She will only wear tennis shoes or her Darco shoe and I will follow-up with her in 1 month.

## 2016-10-28 ENCOUNTER — Ambulatory Visit (INDEPENDENT_AMBULATORY_CARE_PROVIDER_SITE_OTHER): Payer: Self-pay | Admitting: Podiatry

## 2016-10-28 ENCOUNTER — Ambulatory Visit (INDEPENDENT_AMBULATORY_CARE_PROVIDER_SITE_OTHER): Payer: BLUE CROSS/BLUE SHIELD

## 2016-10-28 DIAGNOSIS — M2041 Other hammer toe(s) (acquired), right foot: Secondary | ICD-10-CM

## 2016-10-28 DIAGNOSIS — M2042 Other hammer toe(s) (acquired), left foot: Secondary | ICD-10-CM

## 2016-10-28 DIAGNOSIS — M2010 Hallux valgus (acquired), unspecified foot: Secondary | ICD-10-CM

## 2016-10-29 NOTE — Progress Notes (Signed)
She presents today date of surgery 08/29/2016 status post L[facility] Com[facility] bunionectomy left hammertoe repair second left. She states that she's doing very well she does have some pain occasionally as the foot swells. He states that her pain is not daily. She's been back in shoes for the last month and doing very well.  Objective: Vital signs are stable alert and oriented 3 minimal edema but great range of motion of the first metatarsophalangeal joint. Second toe is rectus in good position. No open lesions or wounds are noted.  Assessment: Well-healing surgical foot.  Plan: Follow up with me on an as-needed basis.

## 2016-11-07 ENCOUNTER — Other Ambulatory Visit: Payer: Self-pay | Admitting: Family Medicine

## 2016-11-14 ENCOUNTER — Ambulatory Visit (INDEPENDENT_AMBULATORY_CARE_PROVIDER_SITE_OTHER): Payer: BLUE CROSS/BLUE SHIELD | Admitting: Family Medicine

## 2016-11-14 ENCOUNTER — Encounter: Payer: Self-pay | Admitting: Family Medicine

## 2016-11-14 DIAGNOSIS — M898X1 Other specified disorders of bone, shoulder: Secondary | ICD-10-CM

## 2016-11-14 DIAGNOSIS — B349 Viral infection, unspecified: Secondary | ICD-10-CM | POA: Diagnosis not present

## 2016-11-14 NOTE — Assessment & Plan Note (Addendum)
Likely MSK strain vs related to fibromyalgia, no clear sign of fracture and no fall.  Start home PT, on celebrex for pain and inflammation

## 2016-11-14 NOTE — Progress Notes (Signed)
Subjective:    Patient ID: Lisa Odom, female    DOB: 1956/03/29, 61 y.o.   MRN: [id number]  HPI   61 year old female with  Severe depression, fibromyalgia, chronic neck pain, chronic congnitive impairment not clearly characterized possibly due to mood and medicaiton SE..presents with  Multiple complaints but they seem to all be connected.    1. Back pain at left shoulder blade x 1 week. No new injury, no fall. Increases with moving left arm, increasing with breathing  2. Cough, shortness of breath in last 24 hours.  Occ dry occ wet cough.  NO fever, earlier in week nasal congestion, headache.  Occ left  ear pain.  Tried OTC sinus med and excedrine.Marland Kitchen Helped some.  NO ST, always has myalgia.  no fever.  Shortness of breath may been due to anxiety on way here on road.   3. Excessive sweating in last week.   No sick contacts.  Blood pressure 131/82, pulse 83, temperature 97.8 F (36.6 C), temperature source Oral, height 5' 1.5" (1.562 m), weight 140 lb (63.5 kg), SpO2 97 %.   Review of Systems  Constitutional: Positive for fatigue. Negative for fever.  HENT: Negative for ear pain.   Eyes: Negative for pain.  Respiratory: Negative for chest tightness and shortness of breath.   Cardiovascular: Negative for chest pain, palpitations and leg swelling.  Gastrointestinal: Negative for abdominal pain.  Genitourinary: Negative for dysuria.       Objective:   Physical Exam  Constitutional: Vital signs are normal. She appears well-developed and well-nourished. She is cooperative.  Non-toxic appearance. She does not appear ill. No distress.  Unusual affect as per usual  HENT:  Head: Normocephalic.  Right Ear: Hearing, tympanic membrane, external ear and ear canal normal. Tympanic membrane is not erythematous, not retracted and not bulging.  Left Ear: Hearing, tympanic membrane, external ear and ear canal normal. Tympanic membrane is not erythematous, not retracted and not  bulging.  Nose: Mucosal edema and rhinorrhea present. Right sinus exhibits no frontal sinus tenderness. Left sinus exhibits no frontal sinus tenderness.  Mouth/Throat: Uvula is midline, oropharynx is clear and moist and mucous membranes are normal. No oropharyngeal exudate, posterior oropharyngeal edema, posterior oropharyngeal erythema or tonsillar abscesses.  Eyes: Conjunctivae, EOM and lids are normal. Pupils are equal, round, and reactive to light. Lids are everted and swept, no foreign bodies found.  Neck: Trachea normal and normal range of motion. Neck supple. Carotid bruit is not present. No thyroid mass and no thyromegaly present.  Cardiovascular: Normal rate, regular rhythm, S1 normal, S2 normal, normal heart sounds, intact distal pulses and normal pulses.  Exam reveals no gallop and no friction rub.   No murmur heard. Pulmonary/Chest: Effort normal and breath sounds normal. No tachypnea. No respiratory distress. She has no decreased breath sounds. She has no wheezes. She has no rhonchi. She has no rales.  Musculoskeletal:       Left shoulder: She exhibits tenderness. She exhibits normal range of motion and no bony tenderness.       Cervical back: She exhibits tenderness. She exhibits normal range of motion and no bony tenderness.  New ttp over left shoulder blade increases with movement.. No change in other body pain  Neurological: She is alert.  Skin: Skin is warm, dry and intact. No rash noted.  Psychiatric: Her speech is normal and behavior is normal. Judgment normal. Her mood appears not anxious. Cognition and memory are normal. She does not exhibit  a depressed mood.          Assessment & Plan:

## 2016-11-14 NOTE — Patient Instructions (Signed)
Start mucinex  DM ( avoid D) twice daily for cough and congestion.  Fluids, rest.  For left shoulder blade.. Start some gentle stretching of left upper back.  Can use celebrex for pain  ( avoid OTC ibuprofen or aleve).

## 2016-11-14 NOTE — Progress Notes (Signed)
Pre visit review using our clinic review tool, if applicable. No additional management support is needed unless otherwise documented below in the visit note. 

## 2016-11-14 NOTE — Assessment & Plan Note (Signed)
Not clearly flu. Rest, fluids, time.

## 2016-11-15 ENCOUNTER — Other Ambulatory Visit: Payer: Self-pay | Admitting: Family Medicine

## 2016-12-15 LAB — HM DIABETES EYE EXAM

## 2016-12-25 ENCOUNTER — Encounter: Payer: Self-pay | Admitting: Family Medicine

## 2017-01-09 ENCOUNTER — Ambulatory Visit (INDEPENDENT_AMBULATORY_CARE_PROVIDER_SITE_OTHER): Payer: BLUE CROSS/BLUE SHIELD | Admitting: Family Medicine

## 2017-01-09 ENCOUNTER — Encounter: Payer: Self-pay | Admitting: Family Medicine

## 2017-01-09 ENCOUNTER — Ambulatory Visit (INDEPENDENT_AMBULATORY_CARE_PROVIDER_SITE_OTHER)
Admission: RE | Admit: 2017-01-09 | Discharge: 2017-01-09 | Disposition: A | Discharge status: Home / Self Care | Payer: BLUE CROSS/BLUE SHIELD | Source: Ambulatory Visit | Attending: Family Medicine | Admitting: Family Medicine

## 2017-01-09 VITALS — BP 130/86 | HR 86 | Temp 98.2°F | Ht 61.5 in

## 2017-01-09 DIAGNOSIS — M79602 Pain in left arm: Secondary | ICD-10-CM

## 2017-01-09 DIAGNOSIS — Z9889 Other specified postprocedural states: Secondary | ICD-10-CM

## 2017-01-09 MED ORDER — GABAPENTIN 600 MG PO TABS: ORAL_TABLET | ORAL | 11 refills | Status: DC

## 2017-01-09 MED ORDER — PREDNISONE 20 MG PO TABS: ORAL_TABLET | ORAL | 0 refills | Status: DC

## 2017-01-09 NOTE — Patient Instructions (Signed)
We will call with X-ray result. Stop celebrex.. Change to prednisone taper x 6 days then restart celebrex.  Continue gabapentin and muscle relaxant.  Call if left arm pain is not improving in 2 weeks for further imaging.

## 2017-01-09 NOTE — Progress Notes (Signed)
Pre visit review using our clinic review tool, if applicable. No additional management support is needed unless otherwise documented below in the visit note. 

## 2017-01-09 NOTE — Progress Notes (Signed)
Subjective:    Patient ID: Lisa Odom, female    DOB: [birth year], 61 y.o.   MRN: [id number]  HPI  61 year old female presents with worsening left shoulder pain, radiates to left arm and fingertips tingling at times.  Ongoing x 2 weeks. Sharp shooting pain.  No proceeding fall, no known injury. No new weakness.  Decreased ROM of bilateral shoulder.  Also has chronic neck pain, was not really improved after past surgery.  No red flags of urinary change, perineal numbness etc.   She has had some improvement in last 2 days.  Tylenol, aleve, ibuprofen has not helped.  She is on celebrex 200 mg daily... Not helping much She is also on neurontin and tizanadine.  She has history of fibromyalgia as well as  Hx of  3 level anteriorcervical decompression 2016 Dr. Trenton Gammon .  HX of rotator cuff surgery on right, in 2013ish.  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 This is most severe on the right at C4-5.  Review of Systems  Constitutional: Negative for fatigue and fever.  HENT: Negative for ear pain.   Eyes: Negative for pain.  Respiratory: Negative for chest tightness and shortness of breath.   Cardiovascular: Negative for chest pain, palpitations and leg swelling.  Gastrointestinal: Negative for abdominal pain.  Genitourinary: Negative for dysuria.       Objective:   Physical Exam  Constitutional: Vital signs are normal. She appears well-developed and well-nourished. She is cooperative.  Non-toxic appearance. She does not appear ill. No distress.  HENT:  Head: Normocephalic.  Right Ear: Hearing, tympanic membrane, external ear and ear canal normal. Tympanic membrane is not erythematous, not retracted and not bulging.  Left Ear: Hearing, tympanic membrane, external ear and ear canal normal. Tympanic membrane is not erythematous, not retracted and not bulging.  Nose: No mucosal edema or rhinorrhea.  Right sinus exhibits no maxillary sinus tenderness and no frontal sinus tenderness. Left sinus exhibits no maxillary sinus tenderness and no frontal sinus tenderness.  Mouth/Throat: Uvula is midline, oropharynx is clear and moist and mucous membranes are normal.  Eyes: Conjunctivae, EOM and lids are normal. Pupils are equal, round, and reactive to light. Lids are everted and swept, no foreign bodies found.  Neck: Trachea normal and normal range of motion. Neck supple. Carotid bruit is not present. No thyroid mass and no thyromegaly present.  Cardiovascular: Normal rate, regular rhythm, S1 normal, S2 normal, normal heart sounds, intact distal pulses and normal pulses.  Exam reveals no gallop and no friction rub.   No murmur heard. Pulmonary/Chest: Effort normal and breath sounds normal. No tachypnea. No respiratory distress. She has no decreased breath sounds. She has no wheezes. She has no rhonchi. She has no rales.  Abdominal: Soft. Normal appearance and bowel sounds are normal. There is no tenderness.  Musculoskeletal:       Right shoulder: She exhibits decreased range of motion and tenderness.       Left shoulder: She exhibits decreased range of motion and tenderness. She exhibits no bony tenderness.       Cervical back: She exhibits decreased range of motion, tenderness and bony tenderness. She exhibits no swelling.  Mildly positive Spurling on left  Neurological: She is alert. She has normal strength. She displays no atrophy. A sensory deficit is present. No cranial nerve deficit. She exhibits normal muscle tone. Coordination and gait normal.  Skin: Skin is warm, dry and  intact. No rash noted.  Psychiatric: Her speech is normal and behavior is normal. Judgment and thought content normal. Her mood appears not anxious. Cognition and memory are normal. She does not exhibit a depressed mood.          Assessment & Plan:

## 2017-01-09 NOTE — Assessment & Plan Note (Signed)
Pt with chronic neck pain and fibromyalgia.. But in last 2 weeks.. New left arm pain and tingling in left hand.  X-ray cervical spine and treat radiculopathy with hoe PT, heat and pred taper.  Continue muscle relaxant and gabapentin.

## 2017-02-23 ENCOUNTER — Other Ambulatory Visit: Payer: Self-pay | Admitting: Family Medicine

## 2017-02-23 DIAGNOSIS — G894 Chronic pain syndrome: Secondary | ICD-10-CM

## 2017-03-23 ENCOUNTER — Encounter: Payer: Self-pay | Admitting: Family Medicine

## 2017-03-23 DIAGNOSIS — R52 Pain, unspecified: Secondary | ICD-10-CM

## 2017-04-28 ENCOUNTER — Ambulatory Visit (INDEPENDENT_AMBULATORY_CARE_PROVIDER_SITE_OTHER): Payer: BLUE CROSS/BLUE SHIELD | Admitting: Family Medicine

## 2017-04-28 VITALS — BP 130/88 | HR 85 | Temp 97.6°F | Ht 61.0 in | Wt 139.8 lb

## 2017-04-28 DIAGNOSIS — E7800 Pure hypercholesterolemia, unspecified: Secondary | ICD-10-CM | POA: Diagnosis not present

## 2017-04-28 DIAGNOSIS — I10 Essential (primary) hypertension: Secondary | ICD-10-CM

## 2017-04-28 DIAGNOSIS — E1140 Type 2 diabetes mellitus with diabetic neuropathy, unspecified: Secondary | ICD-10-CM | POA: Diagnosis not present

## 2017-04-28 DIAGNOSIS — Z0000 Encounter for general adult medical examination without abnormal findings: Secondary | ICD-10-CM

## 2017-04-28 LAB — COMPREHENSIVE METABOLIC PANEL
ALBUMIN: 4.4 g/dL (ref 3.5–5.2)
ALT: 19 U/L (ref 0–35)
AST: 20 U/L (ref 0–37)
Alkaline Phosphatase: 86 U/L (ref 39–117)
BUN: 22 mg/dL (ref 6–23)
CALCIUM: 10.1 mg/dL (ref 8.4–10.5)
CHLORIDE: 102 meq/L (ref 96–112)
CO2: 27 meq/L (ref 19–32)
Creatinine, Ser: 0.75 mg/dL (ref 0.40–1.20)
GFR: 100.24 mL/min (ref >60.00)
Glucose, Bld: 109 mg/dL — ABNORMAL HIGH (ref 70–99)
POTASSIUM: 3.8 meq/L (ref 3.5–5.1)
Sodium: 137 mEq/L (ref 135–145)
Total Bilirubin: 0.4 mg/dL (ref 0.2–1.2)
Total Protein: 7.5 g/dL (ref 6.0–8.3)

## 2017-04-28 LAB — LIPID PANEL
CHOL/HDL RATIO: 4
Cholesterol: 204 mg/dL — ABNORMAL HIGH (ref 0–200)
HDL: 53 mg/dL (ref >39.00)
LDL Cholesterol: 137 mg/dL — ABNORMAL HIGH (ref 0–99)
NONHDL: 151.18
TRIGLYCERIDES: 69 mg/dL (ref 0.0–149.0)
VLDL: 13.8 mg/dL (ref 0.0–40.0)

## 2017-04-28 LAB — HEMOGLOBIN A1C: Hgb A1c MFr Bld: 6.2 % (ref 4.6–6.5)

## 2017-04-28 NOTE — Patient Instructions (Addendum)
Please stop at the lab to have labs drawn.  Consider shingles vaccine when available. Follow up as needed for other concerns.

## 2017-04-28 NOTE — Assessment & Plan Note (Signed)
Due for re-eval. 

## 2017-04-28 NOTE — Progress Notes (Signed)
Subjective:    Patient ID: Lisa Odom, female    DOB: 08-13-1956, 61 y.o.   MRN: [id number]  HPI  The patient is here for annual wellness exam and preventative care.    Elevated Cholesterol:  Due for re-eval. On no med Lab Results  Component Value Date   CHOL 180 04/23/2016   HDL 48.80 04/23/2016   LDLCALC 109 (H) 04/23/2016   TRIG 108.0 04/23/2016   CHOLHDL 4 04/23/2016  Diet compliance:moderate, eats a lot of fruit. Exercise: none Other complaints:  Diabetes:   Due for re-eval. On no med. Lab Results  Component Value Date   HGBA1C 6.0 04/23/2016  Using medications without difficulties: Feet problems: none Blood Sugars averaging: not checking eye exam within last year: uptodate  neg microalbumin in 08/2016  Hypertension:    BP Readings from Last 3 Encounters:  04/28/17 130/88  01/09/17 130/86  11/14/16 131/82   Using medication without problems or lightheadedness:  occ Chest pain with exertion: none Edema: none Short of breath: none Average home BPs: Other issues:   OTHER CHRONIC HEALTH PROBLEMS not discussed today. Body pain, weakness, dizziness and balnce issues.  She will return to discuss.  Social History /Family History/Past Medical History reviewed in detail and updated in EMR if needed. Blood pressure 130/88, pulse 85, temperature 97.6 F (36.4 C), temperature source Oral, height 5\' 1"  (1.549 m), weight 139 lb 12 oz (63.4 kg).    Review of Systems  Constitutional: Positive for fatigue. Negative for fever.  HENT: Positive for congestion.   Eyes: Negative for pain.  Respiratory: Negative for shortness of breath.   Cardiovascular: Negative for chest pain and leg swelling.  Gastrointestinal: Positive for abdominal distention and abdominal pain. Negative for blood in stool.  Genitourinary: Negative for hematuria.  Musculoskeletal: Positive for arthralgias, back pain, gait problem and myalgias.  Neurological: Positive for light-headedness and  numbness.  Psychiatric/Behavioral: Positive for dysphoric mood.       Objective:   Physical Exam  Constitutional: Vital signs are normal. She appears well-developed and well-nourished. She is cooperative.  Non-toxic appearance. She does not appear ill. No distress.  HENT:  Head: Normocephalic.  Right Ear: Hearing, tympanic membrane, external ear and ear canal normal. Tympanic membrane is not erythematous, not retracted and not bulging.  Left Ear: Hearing, tympanic membrane, external ear and ear canal normal. Tympanic membrane is not erythematous, not retracted and not bulging.  Nose: Nose normal. No mucosal edema or rhinorrhea. Right sinus exhibits no maxillary sinus tenderness and no frontal sinus tenderness. Left sinus exhibits no maxillary sinus tenderness and no frontal sinus tenderness.  Mouth/Throat: Uvula is midline, oropharynx is clear and moist and mucous membranes are normal.  Eyes: Pupils are equal, round, and reactive to light. Conjunctivae, EOM and lids are normal. Lids are everted and swept, no foreign bodies found.  Neck: Trachea normal and normal range of motion. Neck supple. Carotid bruit is not present. No thyroid mass and no thyromegaly present.  Cardiovascular: Normal rate, regular rhythm, S1 normal, S2 normal, normal heart sounds, intact distal pulses and normal pulses.  Exam reveals no gallop and no friction rub.   No murmur heard. Pulmonary/Chest: Effort normal and breath sounds normal. No tachypnea. No respiratory distress. She has no decreased breath sounds. She has no wheezes. She has no rhonchi. She has no rales.  Abdominal: Soft. Normal appearance and bowel sounds are normal. She exhibits no distension, no fluid wave, no abdominal bruit and no mass. There is  no hepatosplenomegaly. There is no tenderness. There is no rebound, no guarding and no CVA tenderness. No hernia.  Lymphadenopathy:    She has no cervical adenopathy.    She has no axillary adenopathy.    Neurological: She is alert. She has normal strength. No cranial nerve deficit or sensory deficit. Coordination and gait abnormal.  Skin: Skin is warm, dry and intact. No rash noted.  Psychiatric: Her speech is normal and behavior is normal. Judgment and thought content normal. Her mood appears not anxious. Her affect is labile. Cognition and memory are normal. She does not exhibit a depressed mood.  Histrionic  inappropriate laughing      Diabetic foot exam: Normal inspection No skin breakdown No calluses  Normal DP pulses Normal sensation to light touch and monofilament Nails normal     Assessment & Plan:  The patient's preventative maintenance and recommended screening tests for an annual wellness exam were reviewed in full today. Brought up to date unless services declined.  Counselled on the importance of diet, exercise, and its role in overall health and mortality. The patient's FH and SH was reviewed, including their home life, tobacco status, and drug and alcohol status.   Vaccines:uptodate with Td, flu Nonsmoker Colon: 10/19/12 Olevia Perches, repeat in 2024 PAP/DVE: last pap 2016 nml , neg HPV, q 5 year schedule DEXA: 2016 osteopenia Ca and vit D Mammo:  normal 08/2016

## 2017-04-28 NOTE — Assessment & Plan Note (Signed)
Well controlled on no medication  

## 2017-04-28 NOTE — Assessment & Plan Note (Signed)
Due for re-evaluation with labs.  Encouraged exercise, weight loss, healthy eating habits.

## 2017-04-29 ENCOUNTER — Telehealth: Payer: Self-pay | Admitting: *Deleted

## 2017-04-29 MED ORDER — ATORVASTATIN CALCIUM 40 MG PO TABS: 40.0000 mg | ORAL_TABLET | Freq: Every day | ORAL | 3 refills | Status: DC

## 2017-04-29 NOTE — Telephone Encounter (Signed)
-----   Message from [name], MD sent at 04/28/2017  4:38 PM EDT ----- CMEt and glucose well controlled. Cholesterol above goal LDL < 100. High risk for heart disease.  Recommend statin atorvastatin 40 mg daily. Recheck in 3 months. Send in if pt agreeable.

## 2017-04-29 NOTE — Telephone Encounter (Signed)
Ms. Dionisio notified as instructed by telephone.  She is agreeable to starting atorvastatin. Rx sent into [facility] [address]  She will return in 3 month for cholesterol recheck.

## 2017-05-13 ENCOUNTER — Other Ambulatory Visit: Payer: Self-pay | Admitting: Family Medicine

## 2017-05-13 NOTE — Telephone Encounter (Signed)
Please advise if ok to refill  Last filled 11/07/16 Qty#90 RF#1 Last OV 03/31/17

## 2017-05-15 ENCOUNTER — Other Ambulatory Visit: Payer: Self-pay | Admitting: Family Medicine

## 2017-05-15 NOTE — Telephone Encounter (Signed)
Please advise if ok to refill  Last OV 04/28/17 Last Filled 10/1716 QTY 180 RF # 1

## 2017-05-24 ENCOUNTER — Other Ambulatory Visit: Payer: Self-pay | Admitting: Family Medicine

## 2017-05-24 DIAGNOSIS — G894 Chronic pain syndrome: Secondary | ICD-10-CM

## 2017-07-16 ENCOUNTER — Encounter: Payer: Self-pay | Admitting: Family Medicine

## 2017-07-16 DIAGNOSIS — G894 Chronic pain syndrome: Secondary | ICD-10-CM

## 2017-07-20 ENCOUNTER — Encounter: Payer: Self-pay | Admitting: Family Medicine

## 2017-07-21 MED ORDER — CELECOXIB 200 MG PO CAPS: 200.0000 mg | ORAL_CAPSULE | Freq: Every day | ORAL | 3 refills | Status: DC

## 2017-07-21 MED ORDER — PANTOPRAZOLE SODIUM 40 MG PO TBEC: 40.0000 mg | DELAYED_RELEASE_TABLET | Freq: Every day | ORAL | 3 refills | Status: DC

## 2017-07-21 MED ORDER — DULOXETINE HCL 60 MG PO CPEP: ORAL_CAPSULE | ORAL | 1 refills | Status: DC

## 2017-07-21 MED ORDER — ATORVASTATIN CALCIUM 40 MG PO TABS: 40.0000 mg | ORAL_TABLET | Freq: Every day | ORAL | 2 refills | Status: DC

## 2017-07-21 MED ORDER — GABAPENTIN 600 MG PO TABS: ORAL_TABLET | ORAL | 3 refills | Status: DC

## 2017-07-21 NOTE — Telephone Encounter (Signed)
Please refill/send as requested. Pt says in later email that celecoxib is covered.

## 2017-07-23 ENCOUNTER — Telehealth: Payer: Self-pay | Admitting: *Deleted

## 2017-07-23 NOTE — Telephone Encounter (Signed)
Received fax from [facility] requesting PA for Celecoxib.  PA completed on CoverMyMeds.  Sent to [facility] for review. Can take up to 72 hours for review.

## 2017-07-24 ENCOUNTER — Telehealth: Payer: Self-pay | Admitting: Family Medicine

## 2017-07-24 MED ORDER — MELOXICAM 15 MG PO TABS: 15.0000 mg | ORAL_TABLET | Freq: Every day | ORAL | 1 refills | Status: DC

## 2017-07-24 NOTE — Telephone Encounter (Signed)
Copied from [place]. Topic: Inquiry >> Jul 24, 2017  9:47 AM Ether Griffins B wrote: Reason for CRM: [employer] [place] needing to speak to Dr. Arley [place]ix nurse about an RX denial for this pt. [phone] Berton Lan from [place]) medication is celecoxib we will send a letter to office as well via mail.

## 2017-07-24 NOTE — Telephone Encounter (Signed)
Spoke with Lisa Odom and informed her that her insurance does not cover Celebrex.  She states her insurance called her today as well to let her know that.  She is currently taking ibuprofen and tylenol for the pain.  She states nothing really helps.  She is in pain all the time and can't sleep at night due to the pain.  She states she used Tramadol in the past and it did not help. She remembers taking Meloxicam in the past and does not remember if it helped any but she is willing to give it another try.  She states she has not taking diclofenac but it is in her medication history from 2014 as a one time medication.  Please Advise.

## 2017-07-24 NOTE — Telephone Encounter (Signed)
Celebrex PA denied.

## 2017-07-24 NOTE — Telephone Encounter (Signed)
Will try trial of meloxicam instead of ibuprofen for pain. Let pt know.

## 2017-07-24 NOTE — Telephone Encounter (Signed)
Let pt know.. Has she tried  Other NSAIDs in past? Diclofenac, meloxicam, ibuprofen, aleve etc.?

## 2017-07-24 NOTE — Telephone Encounter (Signed)
Ms. Ahart notified as instructed by telephone.

## 2017-08-04 ENCOUNTER — Telehealth: Payer: Self-pay | Admitting: Family Medicine

## 2017-08-04 MED ORDER — MELOXICAM 15 MG PO TABS: 15.0000 mg | ORAL_TABLET | Freq: Every day | ORAL | 1 refills | Status: DC

## 2017-08-04 NOTE — Telephone Encounter (Signed)
Patient states we sent her meloxicam to the incorrect mail order.  Correct mail order is listed below:  [place], [place]

## 2017-08-04 NOTE — Telephone Encounter (Signed)
Meloxicam Rx resent to [facility].

## 2017-09-17 ENCOUNTER — Encounter: Payer: Self-pay | Admitting: Family Medicine

## 2017-09-18 NOTE — Telephone Encounter (Signed)
Copied from [place] [id number]. Topic: General - Other >> Sep 18, 2017  4:47 PM Lisa Odom wrote: Pt is requesting her A1c1 & Cholesterol .Marland Kitchen She said Dr. Diona Browner told her to call and sch but there are no orders in. She would like to come in on Monday if possible. Call back is [phone]

## 2017-10-08 DIAGNOSIS — M25552 Pain in left hip: Secondary | ICD-10-CM | POA: Diagnosis not present

## 2017-11-05 ENCOUNTER — Other Ambulatory Visit: Payer: Self-pay | Admitting: Family Medicine

## 2017-11-05 DIAGNOSIS — G894 Chronic pain syndrome: Secondary | ICD-10-CM

## 2017-11-05 MED ORDER — PANTOPRAZOLE SODIUM 40 MG PO TBEC: 40.0000 mg | DELAYED_RELEASE_TABLET | Freq: Every day | ORAL | 1 refills | Status: DC

## 2017-11-05 NOTE — Telephone Encounter (Signed)
Copied from [place]. Topic: General - Other >> Nov 05, 2017  2:26 PM Darl Householder, RMA wrote: Reason for CRM: Medication refill request for pantoprazole (PROTONIX) 40 MG tablet  to be sent to [facility]

## 2017-11-05 NOTE — Telephone Encounter (Signed)
I spoke with pt and she request   protonix to [facility]. Has a new ins and no longer uses [facility]. Annual exam on 04/28/17. Refill done per protocol and pt voiced understanding.

## 2017-11-06 ENCOUNTER — Other Ambulatory Visit: Payer: Self-pay | Admitting: Orthopedic Surgery

## 2017-11-11 ENCOUNTER — Other Ambulatory Visit: Payer: Self-pay | Admitting: Orthopedic Surgery

## 2017-11-12 NOTE — Patient Instructions (Signed)
Lisa Odom  11/12/2017   Your procedure is scheduled on: 11-20-17  Report to [facility] Main  Entrance  Follow signs to Short Stay on first floor at 530 AM   Call this number if you have problems the morning of surgery [phone]    Remember: Do not eat food or drink liquids :After Midnight.     Take these medicines the morning of surgery with A SIP OF WATER: ZANAFLEX, PROTONIX, CYMBALTA, GABAPENTIN, ATORVASTATIN, XANAX IF NEEDED, ADDERALL IF NEEDED                                You may not have any metal on your body including hair pins and              piercings  Do not wear jewelry, make-up, lotions, powders or perfumes, deodorant             Do not wear nail polish.  Do not shave  48 hours prior to surgery.             Do not bring valuables to the hospital. [place].  Contacts, dentures or bridgework may not be worn into surgery.  Leave suitcase in the car. After surgery it may be brought to your room.                  Please read over the following fact sheets you were given: _____________________________________________________________________           [facility] - Preparing for Surgery Before surgery, you can play an important role.  Because skin is not sterile, your skin needs to be as free of germs as possible.  You can reduce the number of germs on your skin by washing with CHG (chlorahexidine gluconate) soap before surgery.  CHG is an antiseptic cleaner which kills germs and bonds with the skin to continue killing germs even after washing. Please DO NOT use if you have an allergy to CHG or antibacterial soaps.  If your skin becomes reddened/irritated stop using the CHG and inform your nurse when you arrive at Short Stay. Do not shave (including legs and underarms) for at least 48 hours prior to the first CHG shower.  You may shave your face/neck. Please follow these instructions  carefully:  1.  Shower with CHG Soap the night before surgery and the  morning of Surgery.  2.  If you choose to wash your hair, wash your hair first as usual with your  normal  shampoo.  3.  After you shampoo, rinse your hair and body thoroughly to remove the  shampoo.                           4.  Use CHG as you would any other liquid soap.  You can apply chg directly  to the skin and wash                       Gently with a scrungie or clean washcloth.  5.  Apply the CHG Soap to your body ONLY FROM THE NECK DOWN.   Do not use on face/ open  Wound or open sores. Avoid contact with eyes, ears mouth and genitals (private parts).                       Wash face,  Genitals (private parts) with your normal soap.             6.  Wash thoroughly, paying special attention to the area where your surgery  will be performed.  7.  Thoroughly rinse your body with warm water from the neck down.  8.  DO NOT shower/wash with your normal soap after using and rinsing off  the CHG Soap.                9.  Pat yourself dry with a clean towel.            10.  Wear clean pajamas.            11.  Place clean sheets on your bed the night of your first shower and do not  sleep with pets. Day of Surgery : Do not apply any lotions/deodorants the morning of surgery.  Please wear clean clothes to the hospital/surgery center.  FAILURE TO FOLLOW THESE INSTRUCTIONS MAY RESULT IN THE CANCELLATION OF YOUR SURGERY PATIENT SIGNATURE_________________________________  NURSE SIGNATURE__________________________________  ________________________________________________________________________  WHAT IS A BLOOD TRANSFUSION? Blood Transfusion Information  A transfusion is the replacement of blood or some of its parts. Blood is made up of multiple cells which provide different functions.  Red blood cells carry oxygen and are used for blood loss replacement.  White blood cells fight against  infection.  Platelets control bleeding.  Plasma helps clot blood.  Other blood products are available for specialized needs, such as hemophilia or other clotting disorders. BEFORE THE TRANSFUSION  Who gives blood for transfusions?   Healthy volunteers who are fully evaluated to make sure their blood is safe. This is blood bank blood. Transfusion therapy is the safest it has ever been in the practice of medicine. Before blood is taken from a donor, a complete history is taken to make sure that person has no history of diseases nor engages in risky social behavior (examples are intravenous drug use or sexual activity with multiple partners). The donor's travel history is screened to minimize risk of transmitting infections, such as malaria. The donated blood is tested for signs of infectious diseases, such as HIV and hepatitis. The blood is then tested to be sure it is compatible with you in order to minimize the chance of a transfusion reaction. If you or a relative donates blood, this is often done in anticipation of surgery and is not appropriate for emergency situations. It takes many days to process the donated blood. RISKS AND COMPLICATIONS Although transfusion therapy is very safe and saves many lives, the main dangers of transfusion include:   Getting an infectious disease.  Developing a transfusion reaction. This is an allergic reaction to something in the blood you were given. Every precaution is taken to prevent this. The decision to have a blood transfusion has been considered carefully by your caregiver before blood is given. Blood is not given unless the benefits outweigh the risks. AFTER THE TRANSFUSION  Right after receiving a blood transfusion, you will usually feel much better and more energetic. This is especially true if your red blood cells have gotten low (anemic). The transfusion raises the level of the red blood cells which carry oxygen, and this usually causes an energy  increase.  The nurse administering the transfusion will monitor you carefully for complications. HOME CARE INSTRUCTIONS  No special instructions are needed after a transfusion. You may find your energy is better. Speak with your caregiver about any limitations on activity for underlying diseases you may have. SEEK MEDICAL CARE IF:   Your condition is not improving after your transfusion.  You develop redness or irritation at the intravenous (IV) site. SEEK IMMEDIATE MEDICAL CARE IF:  Any of the following symptoms occur over the next 12 hours:  Shaking chills.  You have a temperature by mouth above 102 F (38.9 C), not controlled by medicine.  Chest, back, or muscle pain.  People around you feel you are not acting correctly or are confused.  Shortness of breath or difficulty breathing.  Dizziness and fainting.  You get a rash or develop hives.  You have a decrease in urine output.  Your urine turns a dark color or changes to pink, red, or brown. Any of the following symptoms occur over the next 10 days:  You have a temperature by mouth above 102 F (38.9 C), not controlled by medicine.  Shortness of breath.  Weakness after normal activity.  The white part of the eye turns yellow (jaundice).  You have a decrease in the amount of urine or are urinating less often.  Your urine turns a dark color or changes to pink, red, or brown. Document Released: 09/12/2000 Document Revised: 12/08/2011 Document Reviewed: 05/01/2008 Exit[employer] Patient Information 2014 [place].  _______________________________________________________________________  Incentive Spirometer  An incentive spirometer is a tool that can help keep your lungs clear and active. This tool measures how well you are filling your lungs with each breath. Taking long deep breaths may help reverse or decrease the chance of developing breathing (pulmonary) problems (especially infection) following:  A long  period of time when you are unable to move or be active. BEFORE THE PROCEDURE   If the spirometer includes an indicator to show your best effort, your nurse or respiratory therapist will set it to a desired goal.  If possible, sit up straight or lean slightly forward. Try not to slouch.  Hold the incentive spirometer in an upright position. INSTRUCTIONS FOR USE  1. Sit on the edge of your bed if possible, or sit up as far as you can in bed or on a chair. 2. Hold the incentive spirometer in an upright position. 3. Breathe out normally. 4. Place the mouthpiece in your mouth and seal your lips tightly around it. 5. Breathe in slowly and as deeply as possible, raising the piston or the ball toward the top of the column. 6. Hold your breath for 3-5 seconds or for as long as possible. Allow the piston or ball to fall to the bottom of the column. 7. Remove the mouthpiece from your mouth and breathe out normally. 8. Rest for a few seconds and repeat Steps 1 through 7 at least 10 times every 1-2 hours when you are awake. Take your time and take a few normal breaths between deep breaths. 9. The spirometer may include an indicator to show your best effort. Use the indicator as a goal to work toward during each repetition. 10. After each set of 10 deep breaths, practice coughing to be sure your lungs are clear. If you have an incision (the cut made at the time of surgery), support your incision when coughing by placing a pillow or rolled up towels firmly against it. Once you are able to get  out of bed, walk around indoors and cough well. You may stop using the incentive spirometer when instructed by your caregiver.  RISKS AND COMPLICATIONS  Take your time so you do not get dizzy or light-headed.  If you are in pain, you may need to take or ask for pain medication before doing incentive spirometry. It is harder to take a deep breath if you are having pain. AFTER USE  Rest and breathe slowly and  easily.  It can be helpful to keep track of a log of your progress. Your caregiver can provide you with a simple table to help with this. If you are using the spirometer at home, follow these instructions: [place] IF:   You are having difficultly using the spirometer.  You have trouble using the spirometer as often as instructed.  Your pain medication is not giving enough relief while using the spirometer.  You develop fever of 100.5 F (38.1 C) or higher. SEEK IMMEDIATE MEDICAL CARE IF:   You cough up bloody sputum that had not been present before.  You develop fever of 102 F (38.9 C) or greater.  You develop worsening pain at or near the incision site. MAKE SURE YOU:   Understand these instructions.  Will watch your condition.  Will get help right away if you are not doing well or get worse. Document Released: 01/26/2007 Document Revised: 12/08/2011 Document Reviewed: 03/29/2007 [facility] Patient Information 2014 [place], [place].   ________________________________________________________________________

## 2017-11-13 ENCOUNTER — Encounter (HOSPITAL_COMMUNITY): Payer: Self-pay

## 2017-11-13 ENCOUNTER — Other Ambulatory Visit: Payer: Self-pay

## 2017-11-13 ENCOUNTER — Encounter (HOSPITAL_COMMUNITY)
Admission: RE | Admit: 2017-11-13 | Discharge: 2017-11-13 | Disposition: A | Discharge status: Home / Self Care | Payer: Medicare Other | Source: Ambulatory Visit | Attending: Orthopaedic Surgery | Admitting: Orthopaedic Surgery

## 2017-11-13 ENCOUNTER — Telehealth: Payer: Self-pay | Admitting: *Deleted

## 2017-11-13 ENCOUNTER — Ambulatory Visit (HOSPITAL_COMMUNITY)
Admission: RE | Admit: 2017-11-13 | Discharge: 2017-11-13 | Disposition: A | Discharge status: Home / Self Care | Payer: Medicare Other | Source: Ambulatory Visit | Attending: Orthopedic Surgery | Admitting: Orthopedic Surgery

## 2017-11-13 DIAGNOSIS — Z4789 Encounter for other orthopedic aftercare: Secondary | ICD-10-CM | POA: Diagnosis not present

## 2017-11-13 DIAGNOSIS — Z01818 Encounter for other preprocedural examination: Secondary | ICD-10-CM | POA: Insufficient documentation

## 2017-11-13 DIAGNOSIS — Z01810 Encounter for preprocedural cardiovascular examination: Secondary | ICD-10-CM | POA: Diagnosis not present

## 2017-11-13 DIAGNOSIS — M1612 Unilateral primary osteoarthritis, left hip: Secondary | ICD-10-CM | POA: Insufficient documentation

## 2017-11-13 DIAGNOSIS — Z01812 Encounter for preprocedural laboratory examination: Secondary | ICD-10-CM | POA: Diagnosis not present

## 2017-11-13 HISTORY — DX: Pneumonia, unspecified organism: J18.9

## 2017-11-13 HISTORY — DX: Fibromyalgia: M79.7

## 2017-11-13 LAB — URINALYSIS, ROUTINE W REFLEX MICROSCOPIC
Bilirubin Urine: NEGATIVE
Glucose, UA: NEGATIVE mg/dL
HGB URINE DIPSTICK: NEGATIVE
Ketones, ur: NEGATIVE mg/dL
Nitrite: NEGATIVE
Protein, ur: NEGATIVE mg/dL
SPECIFIC GRAVITY, URINE: 1.017 (ref 1.005–1.030)
SQUAMOUS EPITHELIAL / LPF: NONE SEEN
pH: 5 (ref 5.0–8.0)

## 2017-11-13 LAB — APTT: aPTT: 32 seconds (ref 24–36)

## 2017-11-13 LAB — BASIC METABOLIC PANEL
ANION GAP: 9 (ref 5–15)
BUN: 20 mg/dL (ref 6–20)
CALCIUM: 9.8 mg/dL (ref 8.9–10.3)
CHLORIDE: 106 mmol/L (ref 101–111)
CO2: 27 mmol/L (ref 22–32)
Creatinine, Ser: 0.71 mg/dL (ref 0.44–1.00)
GFR calc Af Amer: >60 mL/min (ref >60)
GFR calc non Af Amer: >60 mL/min (ref >60)
Glucose, Bld: 95 mg/dL (ref 65–99)
POTASSIUM: 4.5 mmol/L (ref 3.5–5.1)
Sodium: 142 mmol/L (ref 135–145)

## 2017-11-13 LAB — CBC WITH DIFFERENTIAL/PLATELET
BASOS PCT: 1 %
Basophils Absolute: 0 10*3/uL (ref 0.0–0.1)
Eosinophils Absolute: 0.5 10*3/uL (ref 0.0–0.7)
Eosinophils Relative: 6 %
HEMATOCRIT: 36.8 % (ref 36.0–46.0)
HEMOGLOBIN: 12.1 g/dL (ref 12.0–15.0)
LYMPHS ABS: 2.2 10*3/uL (ref 0.7–4.0)
LYMPHS PCT: 25 %
MCH: 29.5 pg (ref 26.0–34.0)
MCHC: 32.9 g/dL (ref 30.0–36.0)
MCV: 89.8 fL (ref 78.0–100.0)
MONOS PCT: 7 %
Monocytes Absolute: 0.6 10*3/uL (ref 0.1–1.0)
NEUTROS ABS: 5.4 10*3/uL (ref 1.7–7.7)
Neutrophils Relative %: 61 %
Platelets: 423 10*3/uL — ABNORMAL HIGH (ref 150–400)
RBC: 4.1 MIL/uL (ref 3.87–5.11)
RDW: 13.7 % (ref 11.5–15.5)
WBC: 8.8 10*3/uL (ref 4.0–10.5)

## 2017-11-13 LAB — PROTIME-INR
INR: 1.01
Prothrombin Time: 13.2 seconds (ref 11.4–15.2)

## 2017-11-13 LAB — SURGICAL PCR SCREEN
MRSA, PCR: NEGATIVE
STAPHYLOCOCCUS AUREUS: NEGATIVE

## 2017-11-13 NOTE — Telephone Encounter (Signed)
Copied from [email or web address]. Topic: Appointment Scheduling - Scheduling Inquiry for Clinic >> Nov 13, 2017 10:58 AM Synthia Innocent wrote: Reason for CRM: Received call from [facility] that she needed to scheduled lab work, no orders are placed. Please advise >> Nov 13, 2017 11:05 AM Modena Nunnery, CMA wrote: Sent to dr Diona Browner in error   Hgb A1c and Microalbumin is showing due on Health Maintenance.  This is probably why she got a [employer].   She has recently been referred to Endocrinologist.   Please advise if patient needs to be scheduled her for any labs.

## 2017-11-13 NOTE — Progress Notes (Signed)
Final ekg in epic 

## 2017-11-13 NOTE — Telephone Encounter (Signed)
No labs needed.Marland Kitchen She will have at her endo

## 2017-11-13 NOTE — Telephone Encounter (Signed)
Left message for Analina that her endocrinologist will do the labs she is due for.  She does not need to have labs done here.

## 2017-11-14 LAB — ABO/RH: ABO/RH(D): O POS

## 2017-11-19 ENCOUNTER — Encounter (HOSPITAL_COMMUNITY): Payer: Self-pay | Admitting: Certified Registered Nurse Anesthetist

## 2017-11-19 MED ORDER — TRANEXAMIC ACID 1000 MG/10ML IV SOLN
2000.0000 mg | INTRAVENOUS | Status: DC
  Filled 2017-11-19: qty 20

## 2017-11-19 NOTE — H&P (Addendum)
TOTAL HIP ADMISSION H&P  Patient is admitted for left total hip arthroplasty.  Subjective:  Chief Complaint: left hip pain  HPI: Lisa Odom, 62 y.o. female, has a history of pain and functional disability in the left hip(s) due to arthritis and patient has failed non-surgical conservative treatments for greater than 12 weeks to include NSAID's and/or analgesics, corticosteriod injections, viscosupplementation injections and activity modification.  Onset of symptoms was gradual starting 3 years ago with gradually worsening course since that time.The patient noted no past surgery on the left hip(s).  Patient currently rates pain in the left hip at 9 out of 10 with activity. Patient has night pain, worsening of pain with activity and weight bearing, trendelenberg gait, pain that interfers with activities of daily living, pain with passive range of motion, crepitus and joint swelling. Patient has evidence of subchondral cysts, periarticular osteophytes, joint subluxation and joint space narrowing by imaging studies. This condition presents safety issues increasing the risk of falls. This patient has had failure of allreasonable conservative care.  There is no current active infection.  Patient Active Problem List   Diagnosis Date Noted  . Left arm pain 01/09/2017  . Shoulder blade pain 11/14/2016  . Bilateral bunions 06/13/2016  . Toenail fungus 06/13/2016  . Bilateral low back pain with left-sided sciatica 06/13/2016  . Slurred speech 04/30/2016  . Confusion 04/30/2016  . Balance problem 04/10/2016  . Left hip pain 12/20/2015  . Memory loss 12/20/2015  . Osteopenia 05/29/2015  . Fibromyalgia 05/22/2015  . Severe recurrent major depression without psychotic features ([place]) 12/06/2014    Class: Chronic  . Spinal stenosis of cervical region 09/12/2014  . Stenosis of cervical spine with myelopathy 09/12/2014  . Acute respiratory acidosis   . Cervical (neck) region somatic dysfunction    . Stenosis of cervical spine   . Elevated CK 08/17/2014  . Chronic neck pain 08/17/2014  . Bilateral anterior knee pain 11/04/2013  . 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 Bilateral foot pain 02/22/2013  . Bilateral leg pain, nighttime 11/30/2012  . Carpal tunnel syndrome of left wrist 11/30/2012  . Panic attack 02/16/2012  . GERD (gastroesophageal reflux disease) 09/05/2011  . ESSENTIAL HYPERTENSION, BENIGN 04/27/2009  . HYPERCHOLESTEROLEMIA 12/19/2008  . Major depressive disorder, recurrent episode, severe ([place]) 12/19/2008  . MIGRAINE, COMMON 12/19/2008  . ALLERGIC RHINITIS 12/19/2008  . GASTRIC ULCER, ACUTE 12/19/2008  . DIVERTICULITIS, COLON 12/19/2008   Past Medical History:  Diagnosis Date  . Anxiety   . Contact lens/glasses fitting    wears contacts or glasses  . Depression   . Diabetes mellitus without complication (HCC)    No meds  . Diverticulosis   . Family history of adverse reaction to anesthesia    pts sister has severe N/V  . Fibromyalgia   . Gastritis   . GERD (gastroesophageal reflux disease)   . Hemorrhoid   . Hiatal hernia    neuropathy  . Migraine   . Neuropathy   . Panic attacks   . Pneumonia    as an infant    Past Surgical History:  Procedure Laterality Date  . ANTERIOR CERVICAL DECOMP/DISCECTOMY FUSION N/A 09/12/2014   Procedure: Evacuation of cervical hematoma;  Surgeon: [name], MD;  Location: [facility];  Service: Neurosurgery;  Laterality: N/A;  . ANTERIOR CERVICAL DECOMP/DISCECTOMY FUSION N/A 09/12/2014   Procedure: CERVICAL FOUR-FIVE, CERVICAL FIVE-SIX, CERVICAL SIX-SEVEN ANTERIOR CERVICAL DECOMPRESSION/DISCECTOMY FUSION;  Surgeon: [name], MD;  Location: [facility];  Service: Neurosurgery;  Laterality: N/A;  . BUNIONECTOMY    . CARPAL TUNNEL RELEASE  2002   right  . COLONOSCOPY    . SHOULDER ARTHROSCOPY WITH ROTATOR CUFF REPAIR  2010   right  . TRIGGER FINGER RELEASE  Right 03/23/2013   Procedure: RELEASE TRIGGER FINGER/A-1 PULLEY RIGHT RING FINGER;  Surgeon: [name], MD;  Location: [facility];  Service: Orthopedics;  Laterality: Right;  . TUBAL LIGATION    . UPPER GI ENDOSCOPY      Current Facility-Administered Medications  Medication Dose Route Frequency Provider Last Rate Last Dose  . [START ON 11/20/2017] tranexamic acid (CYKLOKAPRON) 2,000 mg in sodium chloride 0.9 % 50 mL Topical Application  1,829 mg Topical To OR Berton Mount, RPH       Current Outpatient Medications  Medication Sig Dispense Refill Last Dose  . ALPRAZolam (XANAX) 0.5 MG tablet Take 0.5 mg by mouth 4 (four) times daily as needed for anxiety.     Marland Kitchen amphetamine-dextroamphetamine (ADDERALL) 30 MG tablet Take 30 mg by mouth 2 (two) times daily. In the morning & early afternoon  0 Taking  . aspirin EC 81 MG tablet Take 81 mg by mouth at bedtime.     Marland Kitchen aspirin-acetaminophen-caffeine (EXCEDRIN MIGRAINE) 250-250-65 MG tablet Take 2 tablets by mouth 2 (two) times daily as needed for headache.     Marland Kitchen atorvastatin (LIPITOR) 40 MG tablet Take 1 tablet (40 mg total) by mouth daily. 90 tablet 2   . b complex vitamins capsule Take 1 capsule by mouth every 3 (three) days.    Taking  . Black Pepper-Turmeric (TURMERIC COMPLEX/BLACK PEPPER PO) Take 1 capsule by mouth daily.    Taking  . buPROPion (WELLBUTRIN XL) 150 MG 24 hr tablet Take 150 mg by mouth daily.  2 Taking  . CALCIUM-VITAMIN D PO Take 1-2 tablets by mouth 4 (four) times daily. 1 in the morning, 2 at lunch, 2 in the later afternoon, and 1 tablet at bedtime.   Taking  . DULoxetine (CYMBALTA) 60 MG capsule TAKE 2 CAPSULES (120 MG TOTAL) BY MOUTH DAILY. (Patient taking differently: Take 120 mg by mouth daily. TAKE 2 CAPSULES (120 MG TOTAL) BY MOUTH DAILY.) 180 capsule 1   . gabapentin (NEURONTIN) 600 MG tablet TAKE 1/2 TABLET TWICE DAILY AND 2 TABS AT BEDTIME (Patient taking differently: Take 300-1,200 mg by mouth 3 (three)  times daily. Take 0.5 tablet (300 mg) by mouth in the morning & early afternoon, then 2 tablets (1200 mg) by mouth at bedtime.) 270 tablet 3   . meloxicam (MOBIC) 15 MG tablet Take 1 tablet (15 mg total) daily by mouth. (Patient taking differently: Take 15 mg by mouth at bedtime. ) 90 tablet 1   . Multiple Vitamin (MULTIVITAMIN) tablet Take 1 tablet by mouth daily.   Taking  . pantoprazole (PROTONIX) 40 MG tablet Take 1 tablet (40 mg total) by mouth daily. 90 tablet 1   . HYDROmorphone (DILAUDID) 2 MG tablet Take 2 mg by mouth every 8 (eight) hours as needed. Severe pain.  0   . Omega-3 Fatty Acids (FISH OIL PO) Take 1 capsule by mouth daily.   Taking  . tiZANidine (ZANAFLEX) 4 MG capsule Take 4 mg by mouth 3 (three) times daily.   Taking   Allergies  Allergen Reactions  . Hydrochlorothiazide Rash  . Sulfonamide Derivatives Rash    Social History   Tobacco Use  . Smoking status: Never  Smoker  . Smokeless tobacco: Never Used  Substance Use Topics  . Alcohol use: No    Alcohol/week: 0.0 oz    Family History  Problem Relation Age of Onset  . Diabetes Sister   . Anxiety disorder Sister   . Heart disease Brother   . Ovarian cancer Cousin   . Breast cancer Maternal Grandmother        grandmother  . Diabetes Maternal Aunt        aunt  . Alcohol abuse Maternal Uncle        uncle  . Diabetes Paternal Aunt   . Alcohol abuse Paternal Uncle      ROS .ROS: I have reviewed the patient's review of systems thoroughly and there are no positive responses as relates to the HPI. Objective:  Physical Exam  Vital signs in last 24 hours:    Vitals:   11/20/17 0618  BP: (!) 150/82  Pulse: 87  Resp: 18  Temp: 97.8 F (36.6 C)  SpO2: 95%   Well-developed well-nourished patient in no acute distress. Alert and oriented x3 HEENT:within normal limits Cardiac: Regular rate and rhythm Pulmonary: Lungs clear to auscultation Abdomen: Soft and nontender.  Normal active bowel  sounds  Musculoskeletal: left hip: Limited range of motion.  Painful range of motion.  Neurovascularly intact distally. Labs: Recent Results (from the past 2160 hour(s))  Urinalysis, Routine w reflex microscopic     Status: Abnormal   Collection Time: 11/13/17  1:03 PM  Result Value Ref Range   Color, Urine YELLOW YELLOW   APPearance CLEAR CLEAR   Specific Gravity, Urine 1.017 1.005 - 1.030   pH 5.0 5.0 - 8.0   Glucose, UA NEGATIVE NEGATIVE mg/dL   Hgb urine dipstick NEGATIVE NEGATIVE   Bilirubin Urine NEGATIVE NEGATIVE   Ketones, ur NEGATIVE NEGATIVE mg/dL   Protein, ur NEGATIVE NEGATIVE mg/dL   Nitrite NEGATIVE NEGATIVE   Leukocytes, UA MODERATE (A) NEGATIVE   RBC / HPF 0-5 0 - 5 RBC/hpf   WBC, UA 6-30 0 - 5 WBC/hpf   Bacteria, UA RARE (A) NONE SEEN   Squamous Epithelial / LPF NONE SEEN NONE SEEN   Mucus PRESENT    Hyaline Casts, UA PRESENT     Comment: Performed at [facility], [place] [address] [place], [place] [address]  Surgical pcr screen     Status: None   Collection Time: 11/13/17  1:15 PM  Result Value Ref Range   MRSA, PCR NEGATIVE NEGATIVE   Staphylococcus aureus NEGATIVE NEGATIVE    Comment: (NOTE) The Xpert SA Assay (FDA approved for NASAL specimens in patients 33 years of age and older), is one component of a comprehensive surveillance program. It is not intended to diagnose infection nor to guide or monitor treatment. Performed at [facility], [place] [address], [place], [place] [address]   APTT     Status: None   Collection Time: 11/13/17  1:42 PM  Result Value Ref Range   aPTT 32 24 - 36 seconds    Comment: Performed at [facility], [place] [address] [place], [place] [address]  Basic metabolic panel     Status: None   Collection Time: 11/13/17  1:42 PM  Result Value Ref Range   Sodium 142 135 - 145 mmol/L   Potassium 4.5 3.5 - 5.1 mmol/L   Chloride 106 101 - 111 mmol/L   CO2 27 22 - 32 mmol/L    Glucose, Bld 95 65 -  99 mg/dL   BUN 20 6 - 20 mg/dL   Creatinine, Ser 0.71 0.44 - 1.00 mg/dL   Calcium 9.8 8.9 - 10.3 mg/dL   GFR calc non Af Amer >60 >60 mL/min   GFR calc Af Amer >60 >60 mL/min    Comment: (NOTE) The eGFR has been calculated using the CKD EPI equation. This calculation has not been validated in all clinical situations. eGFR's persistently <60 mL/min signify possible Chronic Kidney Disease.    Anion gap 9 5 - 15    Comment: Performed at [facility], [place] [address], [place], [place] [address]  CBC WITH DIFFERENTIAL     Status: Abnormal   Collection Time: 11/13/17  1:42 PM  Result Value Ref Range   WBC 8.8 4.0 - 10.5 K/uL   RBC 4.10 3.87 - 5.11 MIL/uL   Hemoglobin 12.1 12.0 - 15.0 g/dL   HCT 36.8 36.0 - 46.0 %   MCV 89.8 78.0 - 100.0 fL   MCH 29.5 26.0 - 34.0 pg   MCHC 32.9 30.0 - 36.0 g/dL   RDW 13.7 11.5 - 15.5 %   Platelets 423 (H) 150 - 400 K/uL   Neutrophils Relative % 61 %   Neutro Abs 5.4 1.7 - 7.7 K/uL   Lymphocytes Relative 25 %   Lymphs Abs 2.2 0.7 - 4.0 K/uL   Monocytes Relative 7 %   Monocytes Absolute 0.6 0.1 - 1.0 K/uL   Eosinophils Relative 6 %   Eosinophils Absolute 0.5 0.0 - 0.7 K/uL   Basophils Relative 1 %   Basophils Absolute 0.0 0.0 - 0.1 K/uL    Comment: Performed at [facility], [place] [address], [place], [place] [address]  Protime-INR     Status: None   Collection Time: 11/13/17  1:42 PM  Result Value Ref Range   Prothrombin Time 13.2 11.4 - 15.2 seconds   INR 1.01     Comment: Performed at [facility], [place] [address], [place], [place] [address]  Type and screen Order type and screen if day of surgery is less than 15 days from draw of preadmission visit or order morning of surgery if day of surgery is greater than 6 days from preadmission visit.     Status: None   Collection Time: 11/13/17  1:42 PM  Result Value Ref Range   ABO/RH(D) O POS    Antibody Screen NEG    Sample  Expiration 11/27/2017    Extend sample reason      NO TRANSFUSIONS OR PREGNANCY IN THE PAST 3 MONTHS Performed at [facility], [place] [address], [place], [place] [address]   ABO/Rh     Status: None   Collection Time: 11/13/17  1:42 PM  Result Value Ref Range   ABO/RH(D)      O POS Performed at [facility], [place] [address] [place], [place] [address]     Estimated body mass index is 24.91 kg/m as calculated from the following:   Height as of 11/13/17: 5' 1.5" (1.562 m).   Weight as of 11/13/17: 60.8 kg (134 lb).   Imaging Review Plain radiographs demonstrate severe degenerative joint disease of the left hip(s). The bone quality appears to be poor for age and reported activity level.  Assessment/Plan:  End stage arthritis, left hip(s)  The patient history, physical examination, clinical judgement of the provider and imaging studies are consistent with end stage degenerative joint disease of the left hip(s) and  total hip arthroplasty is deemed medically necessary. The treatment options including medical management, injection therapy, arthroscopy and arthroplasty were discussed at length. The risks and benefits of total hip arthroplasty were presented and reviewed. The risks due to aseptic loosening, infection, stiffness, dislocation/subluxation,  thromboembolic complications and other imponderables were discussed.  The patient acknowledged the explanation, agreed to proceed with the plan and consent was signed. Patient is being admitted for inpatient treatment for surgery, pain control, PT, OT, prophylactic antibiotics, VTE prophylaxis, progressive ambulation and ADL's and discharge planning.The patient is planning to be discharged home with home health services   No change in H@P  overnight.

## 2017-11-20 ENCOUNTER — Inpatient Hospital Stay (HOSPITAL_COMMUNITY): Payer: Medicare Other | Admitting: Certified Registered Nurse Anesthetist

## 2017-11-20 ENCOUNTER — Other Ambulatory Visit: Payer: Self-pay

## 2017-11-20 ENCOUNTER — Inpatient Hospital Stay (HOSPITAL_COMMUNITY): Payer: Medicare Other

## 2017-11-20 ENCOUNTER — Inpatient Hospital Stay (HOSPITAL_COMMUNITY)
Admission: RE | Admit: 2017-11-20 | Discharge: 2017-11-21 | DRG: 470 | Disposition: A | Discharge status: Home Health | Payer: Medicare Other | Source: Ambulatory Visit | Attending: Orthopedic Surgery | Admitting: Orthopedic Surgery

## 2017-11-20 ENCOUNTER — Encounter (HOSPITAL_COMMUNITY): Payer: Self-pay

## 2017-11-20 ENCOUNTER — Encounter (HOSPITAL_COMMUNITY)
Admission: RE | Disposition: A | Discharge status: Home Health | Payer: Self-pay | Source: Ambulatory Visit | Attending: Orthopedic Surgery

## 2017-11-20 DIAGNOSIS — Z79899 Other long term (current) drug therapy: Secondary | ICD-10-CM | POA: Diagnosis not present

## 2017-11-20 DIAGNOSIS — M247 Protrusio acetabuli: Secondary | ICD-10-CM | POA: Diagnosis not present

## 2017-11-20 DIAGNOSIS — F419 Anxiety disorder, unspecified: Secondary | ICD-10-CM | POA: Diagnosis present

## 2017-11-20 DIAGNOSIS — M25552 Pain in left hip: Secondary | ICD-10-CM | POA: Diagnosis present

## 2017-11-20 DIAGNOSIS — Z882 Allergy status to sulfonamides status: Secondary | ICD-10-CM

## 2017-11-20 DIAGNOSIS — Z419 Encounter for procedure for purposes other than remedying health state, unspecified: Secondary | ICD-10-CM

## 2017-11-20 DIAGNOSIS — M797 Fibromyalgia: Secondary | ICD-10-CM | POA: Diagnosis not present

## 2017-11-20 DIAGNOSIS — I10 Essential (primary) hypertension: Secondary | ICD-10-CM | POA: Diagnosis not present

## 2017-11-20 DIAGNOSIS — E1149 Type 2 diabetes mellitus with other diabetic neurological complication: Secondary | ICD-10-CM | POA: Diagnosis present

## 2017-11-20 DIAGNOSIS — Z7982 Long term (current) use of aspirin: Secondary | ICD-10-CM

## 2017-11-20 DIAGNOSIS — F332 Major depressive disorder, recurrent severe without psychotic features: Secondary | ICD-10-CM | POA: Diagnosis present

## 2017-11-20 DIAGNOSIS — Z833 Family history of diabetes mellitus: Secondary | ICD-10-CM

## 2017-11-20 DIAGNOSIS — Z96642 Presence of left artificial hip joint: Secondary | ICD-10-CM

## 2017-11-20 DIAGNOSIS — M8580 Other specified disorders of bone density and structure, unspecified site: Secondary | ICD-10-CM | POA: Diagnosis not present

## 2017-11-20 DIAGNOSIS — E7800 Pure hypercholesterolemia, unspecified: Secondary | ICD-10-CM | POA: Diagnosis not present

## 2017-11-20 DIAGNOSIS — M1612 Unilateral primary osteoarthritis, left hip: Principal | ICD-10-CM | POA: Diagnosis present

## 2017-11-20 DIAGNOSIS — K219 Gastro-esophageal reflux disease without esophagitis: Secondary | ICD-10-CM | POA: Diagnosis not present

## 2017-11-20 DIAGNOSIS — F1120 Opioid dependence, uncomplicated: Secondary | ICD-10-CM | POA: Diagnosis present

## 2017-11-20 DIAGNOSIS — J309 Allergic rhinitis, unspecified: Secondary | ICD-10-CM | POA: Diagnosis not present

## 2017-11-20 DIAGNOSIS — Z471 Aftercare following joint replacement surgery: Secondary | ICD-10-CM | POA: Diagnosis not present

## 2017-11-20 DIAGNOSIS — Z888 Allergy status to other drugs, medicaments and biological substances status: Secondary | ICD-10-CM

## 2017-11-20 HISTORY — PX: TOTAL HIP ARTHROPLASTY: SHX124

## 2017-11-20 LAB — TYPE AND SCREEN
ABO/RH(D): O POS
Antibody Screen: NEGATIVE

## 2017-11-20 LAB — GLUCOSE, CAPILLARY
GLUCOSE-CAPILLARY: 91 mg/dL (ref 65–99)
GLUCOSE-CAPILLARY: 99 mg/dL (ref 65–99)

## 2017-11-20 SURGERY — ARTHROPLASTY, HIP, TOTAL, ANTERIOR APPROACH
Anesthesia: Spinal | Site: Hip | Laterality: Left

## 2017-11-20 MED ORDER — SODIUM CHLORIDE 0.9 % IR SOLN
Status: DC | PRN
  Administered 2017-11-20: 1000 mL

## 2017-11-20 MED ORDER — DOCUSATE SODIUM 100 MG PO CAPS
100.0000 mg | ORAL_CAPSULE | Freq: Two times a day (BID) | ORAL | Status: DC
  Administered 2017-11-20 – 2017-11-21 (×2): 100 mg via ORAL
  Filled 2017-11-20 (×2): qty 1

## 2017-11-20 MED ORDER — LIDOCAINE 2% (20 MG/ML) 5 ML SYRINGE
INTRAMUSCULAR | Status: AC
  Filled 2017-11-20: qty 5

## 2017-11-20 MED ORDER — MIDAZOLAM HCL 2 MG/2ML IJ SOLN
INTRAMUSCULAR | Status: AC
  Filled 2017-11-20: qty 2

## 2017-11-20 MED ORDER — PHENYLEPHRINE HCL 10 MG/ML IJ SOLN
INTRAMUSCULAR | Status: AC
  Filled 2017-11-20: qty 1

## 2017-11-20 MED ORDER — DULOXETINE HCL 60 MG PO CPEP
120.0000 mg | ORAL_CAPSULE | Freq: Every day | ORAL | Status: DC
  Filled 2017-11-20: qty 2

## 2017-11-20 MED ORDER — ACETAMINOPHEN 650 MG RE SUPP: 650.0000 mg | RECTAL | Status: DC | PRN

## 2017-11-20 MED ORDER — EPHEDRINE 5 MG/ML INJ
INTRAVENOUS | Status: AC
  Filled 2017-11-20: qty 10

## 2017-11-20 MED ORDER — MIDAZOLAM HCL 5 MG/5ML IJ SOLN
INTRAMUSCULAR | Status: DC | PRN
  Administered 2017-11-20: 2 mg via INTRAVENOUS

## 2017-11-20 MED ORDER — ENSURE ENLIVE PO LIQD: 237.0000 mL | Freq: Every day | ORAL | Status: DC

## 2017-11-20 MED ORDER — PROPOFOL 500 MG/50ML IV EMUL
INTRAVENOUS | Status: DC | PRN
  Administered 2017-11-20: 75 ug/kg/min via INTRAVENOUS

## 2017-11-20 MED ORDER — SODIUM CHLORIDE 0.9 % IJ SOLN
INTRAMUSCULAR | Status: AC
Start: 2017-11-20 — End: 2017-11-20
  Filled 2017-11-20: qty 50

## 2017-11-20 MED ORDER — PROPOFOL 10 MG/ML IV BOLUS
INTRAVENOUS | Status: AC
  Filled 2017-11-20: qty 60

## 2017-11-20 MED ORDER — BUPROPION HCL ER (XL) 150 MG PO TB24
150.0000 mg | ORAL_TABLET | Freq: Every day | ORAL | Status: DC
  Filled 2017-11-20: qty 1

## 2017-11-20 MED ORDER — PHENYLEPHRINE 40 MCG/ML (10ML) SYRINGE FOR IV PUSH (FOR BLOOD PRESSURE SUPPORT)
PREFILLED_SYRINGE | INTRAVENOUS | Status: AC
  Filled 2017-11-20: qty 10

## 2017-11-20 MED ORDER — HYDROMORPHONE HCL 1 MG/ML IJ SOLN: 0.2500 mg | INTRAMUSCULAR | Status: DC | PRN

## 2017-11-20 MED ORDER — ONDANSETRON HCL 4 MG PO TABS: 4.0000 mg | ORAL_TABLET | Freq: Four times a day (QID) | ORAL | Status: DC | PRN

## 2017-11-20 MED ORDER — CEFAZOLIN SODIUM-DEXTROSE 2-4 GM/100ML-% IV SOLN
2.0000 g | INTRAVENOUS | Status: AC
  Administered 2017-11-20: 2 g via INTRAVENOUS
  Filled 2017-11-20: qty 100

## 2017-11-20 MED ORDER — CHLORHEXIDINE GLUCONATE 4 % EX LIQD: 60.0000 mL | Freq: Once | CUTANEOUS | Status: DC

## 2017-11-20 MED ORDER — TRANEXAMIC ACID 1000 MG/10ML IV SOLN
1000.0000 mg | Freq: Once | INTRAVENOUS | Status: AC
  Administered 2017-11-20: 1000 mg via INTRAVENOUS
  Filled 2017-11-20: qty 1100

## 2017-11-20 MED ORDER — BUPIVACAINE HCL (PF) 0.25 % IJ SOLN
INTRAMUSCULAR | Status: AC
Start: 2017-11-20 — End: 2017-11-20
  Filled 2017-11-20: qty 30

## 2017-11-20 MED ORDER — DEXAMETHASONE SODIUM PHOSPHATE 10 MG/ML IJ SOLN
INTRAMUSCULAR | Status: DC | PRN
  Administered 2017-11-20: 5 mg via INTRAVENOUS

## 2017-11-20 MED ORDER — DEXAMETHASONE SODIUM PHOSPHATE 10 MG/ML IJ SOLN
INTRAMUSCULAR | Status: AC
  Filled 2017-11-20: qty 1

## 2017-11-20 MED ORDER — GABAPENTIN 300 MG PO CAPS
300.0000 mg | ORAL_CAPSULE | Freq: Three times a day (TID) | ORAL | Status: DC
  Administered 2017-11-20 – 2017-11-21 (×4): 300 mg via ORAL
  Filled 2017-11-20 (×4): qty 1

## 2017-11-20 MED ORDER — ACETAMINOPHEN 325 MG PO TABS: 650.0000 mg | ORAL_TABLET | ORAL | Status: DC | PRN

## 2017-11-20 MED ORDER — PROPOFOL 10 MG/ML IV BOLUS
INTRAVENOUS | Status: AC
  Filled 2017-11-20: qty 20

## 2017-11-20 MED ORDER — AMPHETAMINE-DEXTROAMPHETAMINE 10 MG PO TABS
30.0000 mg | ORAL_TABLET | ORAL | Status: DC
  Administered 2017-11-20 – 2017-11-21 (×3): 30 mg via ORAL
  Filled 2017-11-20 (×3): qty 3

## 2017-11-20 MED ORDER — BISACODYL 5 MG PO TBEC: 5.0000 mg | DELAYED_RELEASE_TABLET | Freq: Every day | ORAL | Status: DC | PRN

## 2017-11-20 MED ORDER — BUPIVACAINE HCL (PF) 0.25 % IJ SOLN
INTRAMUSCULAR | Status: DC | PRN
  Administered 2017-11-20: 30 mL

## 2017-11-20 MED ORDER — DEXAMETHASONE SODIUM PHOSPHATE 10 MG/ML IJ SOLN
10.0000 mg | Freq: Two times a day (BID) | INTRAMUSCULAR | Status: AC
  Administered 2017-11-20 – 2017-11-21 (×2): 10 mg via INTRAVENOUS
  Filled 2017-11-20 (×2): qty 1

## 2017-11-20 MED ORDER — CEFAZOLIN SODIUM-DEXTROSE 2-4 GM/100ML-% IV SOLN
2.0000 g | Freq: Four times a day (QID) | INTRAVENOUS | Status: AC
  Administered 2017-11-20 (×2): 2 g via INTRAVENOUS
  Filled 2017-11-20 (×2): qty 100

## 2017-11-20 MED ORDER — ONDANSETRON HCL 4 MG/2ML IJ SOLN
INTRAMUSCULAR | Status: AC
  Filled 2017-11-20: qty 2

## 2017-11-20 MED ORDER — PANTOPRAZOLE SODIUM 40 MG PO TBEC
40.0000 mg | DELAYED_RELEASE_TABLET | Freq: Every day | ORAL | Status: DC
  Administered 2017-11-21: 40 mg via ORAL
  Filled 2017-11-20: qty 1

## 2017-11-20 MED ORDER — OXYCODONE HCL 5 MG PO TABS
5.0000 mg | ORAL_TABLET | ORAL | Status: DC | PRN
  Administered 2017-11-20: 16:00:00 5 mg via ORAL
  Administered 2017-11-20 – 2017-11-21 (×3): 10 mg via ORAL
  Filled 2017-11-20: qty 1
  Filled 2017-11-20: qty 2
  Filled 2017-11-20: qty 1
  Filled 2017-11-20: qty 2
  Filled 2017-11-20: qty 1

## 2017-11-20 MED ORDER — METHOCARBAMOL 1000 MG/10ML IJ SOLN
500.0000 mg | Freq: Four times a day (QID) | INTRAMUSCULAR | Status: DC | PRN
  Administered 2017-11-20: 500 mg via INTRAVENOUS
  Filled 2017-11-20: qty 550

## 2017-11-20 MED ORDER — FENTANYL CITRATE (PF) 100 MCG/2ML IJ SOLN
INTRAMUSCULAR | Status: DC | PRN
  Administered 2017-11-20: 50 ug via INTRAVENOUS
  Administered 2017-11-20: 25 ug via INTRAVENOUS

## 2017-11-20 MED ORDER — SODIUM CHLORIDE 0.9 % IV SOLN
INTRAVENOUS | Status: DC
  Administered 2017-11-20 (×2): via INTRAVENOUS

## 2017-11-20 MED ORDER — EPHEDRINE SULFATE-NACL 50-0.9 MG/10ML-% IV SOSY
PREFILLED_SYRINGE | INTRAVENOUS | Status: DC | PRN
  Administered 2017-11-20 (×4): 5 mg via INTRAVENOUS

## 2017-11-20 MED ORDER — ASPIRIN EC 325 MG PO TBEC
325.0000 mg | DELAYED_RELEASE_TABLET | Freq: Two times a day (BID) | ORAL | Status: DC
  Administered 2017-11-20 – 2017-11-21 (×2): 325 mg via ORAL
  Filled 2017-11-20 (×2): qty 1

## 2017-11-20 MED ORDER — DOCUSATE SODIUM 100 MG PO CAPS: 100.0000 mg | ORAL_CAPSULE | Freq: Two times a day (BID) | ORAL | 0 refills | Status: DC

## 2017-11-20 MED ORDER — ONDANSETRON HCL 4 MG/2ML IJ SOLN
INTRAMUSCULAR | Status: DC | PRN
  Administered 2017-11-20: 4 mg via INTRAVENOUS

## 2017-11-20 MED ORDER — ASPIRIN EC 325 MG PO TBEC: 325.0000 mg | DELAYED_RELEASE_TABLET | Freq: Two times a day (BID) | ORAL | 0 refills | Status: DC

## 2017-11-20 MED ORDER — LACTATED RINGERS IV SOLN
INTRAVENOUS | Status: DC
  Administered 2017-11-20 (×3): via INTRAVENOUS

## 2017-11-20 MED ORDER — DEXTROSE 5 % IV SOLN
INTRAVENOUS | Status: DC | PRN
  Administered 2017-11-20: 50 ug/min via INTRAVENOUS

## 2017-11-20 MED ORDER — METHOCARBAMOL 500 MG PO TABS
500.0000 mg | ORAL_TABLET | Freq: Four times a day (QID) | ORAL | Status: DC | PRN
  Administered 2017-11-21 (×2): 500 mg via ORAL
  Filled 2017-11-20 (×2): qty 1

## 2017-11-20 MED ORDER — ALPRAZOLAM 0.5 MG PO TABS: 0.5000 mg | ORAL_TABLET | Freq: Four times a day (QID) | ORAL | Status: DC | PRN

## 2017-11-20 MED ORDER — FENTANYL CITRATE (PF) 100 MCG/2ML IJ SOLN
INTRAMUSCULAR | Status: AC
  Filled 2017-11-20: qty 2

## 2017-11-20 MED ORDER — ONDANSETRON HCL 4 MG/2ML IJ SOLN: 4.0000 mg | Freq: Four times a day (QID) | INTRAMUSCULAR | Status: DC | PRN

## 2017-11-20 MED ORDER — TRANEXAMIC ACID 1000 MG/10ML IV SOLN
1000.0000 mg | INTRAVENOUS | Status: AC
  Administered 2017-11-20: 1000 mg via INTRAVENOUS
  Filled 2017-11-20: qty 1100

## 2017-11-20 MED ORDER — PHENYLEPHRINE 40 MCG/ML (10ML) SYRINGE FOR IV PUSH (FOR BLOOD PRESSURE SUPPORT)
PREFILLED_SYRINGE | INTRAVENOUS | Status: DC | PRN
  Administered 2017-11-20 (×3): 80 ug via INTRAVENOUS

## 2017-11-20 MED ORDER — HYDROMORPHONE HCL 1 MG/ML IJ SOLN
0.5000 mg | INTRAMUSCULAR | Status: DC | PRN
  Administered 2017-11-20: 0.5 mg via INTRAVENOUS
  Filled 2017-11-20: qty 1

## 2017-11-20 MED ORDER — MEPERIDINE HCL 50 MG/ML IJ SOLN: 6.2500 mg | INTRAMUSCULAR | Status: DC | PRN

## 2017-11-20 MED ORDER — PROMETHAZINE HCL 25 MG/ML IJ SOLN: 6.2500 mg | INTRAMUSCULAR | Status: DC | PRN

## 2017-11-20 MED ORDER — TIZANIDINE HCL 4 MG PO TABS: 2.0000 mg | ORAL_TABLET | Freq: Three times a day (TID) | ORAL | 0 refills | Status: DC | PRN

## 2017-11-20 MED ORDER — ENSURE ENLIVE PO LIQD
237.0000 mL | Freq: Two times a day (BID) | ORAL | Status: DC
  Administered 2017-11-20: 237 mL via ORAL

## 2017-11-20 MED ORDER — LIDOCAINE 2% (20 MG/ML) 5 ML SYRINGE
INTRAMUSCULAR | Status: DC | PRN
  Administered 2017-11-20: 60 mg via INTRAVENOUS

## 2017-11-20 MED ORDER — POLYETHYLENE GLYCOL 3350 17 G PO PACK: 17.0000 g | PACK | Freq: Every day | ORAL | Status: DC | PRN

## 2017-11-20 MED ORDER — ALUM & MAG HYDROXIDE-SIMETH 200-200-20 MG/5ML PO SUSP: 30.0000 mL | ORAL | Status: DC | PRN

## 2017-11-20 MED ORDER — STERILE WATER FOR IRRIGATION IR SOLN
Status: DC | PRN
  Administered 2017-11-20: 2000 mL

## 2017-11-20 MED ORDER — OXYCODONE-ACETAMINOPHEN 5-325 MG PO TABS: 1.0000 | ORAL_TABLET | Freq: Four times a day (QID) | ORAL | 0 refills | Status: DC | PRN

## 2017-11-20 MED ORDER — BUPIVACAINE LIPOSOME 1.3 % IJ SUSP
20.0000 mL | Freq: Once | INTRAMUSCULAR | Status: AC
  Administered 2017-11-20: 20 mL
  Filled 2017-11-20: qty 20

## 2017-11-20 MED ORDER — MAGNESIUM CITRATE PO SOLN: 1.0000 | Freq: Once | ORAL | Status: DC | PRN

## 2017-11-20 MED ORDER — LACTATED RINGERS IV SOLN: INTRAVENOUS | Status: DC

## 2017-11-20 MED ORDER — TRANEXAMIC ACID 1000 MG/10ML IV SOLN
INTRAVENOUS | Status: AC | PRN
  Administered 2017-11-20: 2000 mg via TOPICAL

## 2017-11-20 MED ORDER — DIPHENHYDRAMINE HCL 12.5 MG/5ML PO ELIX: 12.5000 mg | ORAL_SOLUTION | ORAL | Status: DC | PRN

## 2017-11-20 MED ORDER — AMPHETAMINE-DEXTROAMPHETAMINE 30 MG PO TABS: 30.0000 mg | ORAL_TABLET | Freq: Two times a day (BID) | ORAL | Status: DC

## 2017-11-20 MED ORDER — BUPIVACAINE IN DEXTROSE 0.75-8.25 % IT SOLN
INTRATHECAL | Status: DC | PRN
  Administered 2017-11-20: 1.8 mL via INTRATHECAL

## 2017-11-20 SURGICAL SUPPLY — 41 items
BAG ZIPLOCK 12X15 (MISCELLANEOUS) ×3 IMPLANT
BENZOIN TINCTURE PRP APPL 2/3 (GAUZE/BANDAGES/DRESSINGS) ×3 IMPLANT
BLADE SAW SGTL 18X1.27X75 (BLADE) ×2 IMPLANT
BLADE SAW SGTL 18X1.27X75MM (BLADE) ×1
CAPT HIP TOTAL 2 ×3 IMPLANT
CELLS DAT CNTRL 66122 CELL SVR (MISCELLANEOUS) ×1 IMPLANT
CLOSURE WOUND 1/2 X4 (GAUZE/BANDAGES/DRESSINGS) ×1
COVER PERINEAL POST (MISCELLANEOUS) ×3 IMPLANT
COVER SURGICAL LIGHT HANDLE (MISCELLANEOUS) ×3 IMPLANT
DRAPE STERI IOBAN 125X83 (DRAPES) ×3 IMPLANT
DRAPE U-SHAPE 47X51 STRL (DRAPES) ×6 IMPLANT
DRESSING AQUACEL AG SP 3.5X6 (GAUZE/BANDAGES/DRESSINGS) ×1 IMPLANT
DRSG AQUACEL AG SP 3.5X6 (GAUZE/BANDAGES/DRESSINGS) ×3
DURAPREP 26ML APPLICATOR (WOUND CARE) ×3 IMPLANT
ELECT REM PT RETURN 15FT ADLT (MISCELLANEOUS) ×3 IMPLANT
GLOVE BIO SURGEON STRL SZ 6.5 (GLOVE) ×2 IMPLANT
GLOVE BIO SURGEONS STRL SZ 6.5 (GLOVE) ×1
GLOVE BIOGEL PI IND STRL 6.5 (GLOVE) ×1 IMPLANT
GLOVE BIOGEL PI IND STRL 7.5 (GLOVE) ×3 IMPLANT
GLOVE BIOGEL PI IND STRL 8 (GLOVE) ×2 IMPLANT
GLOVE BIOGEL PI INDICATOR 6.5 (GLOVE) ×2
GLOVE BIOGEL PI INDICATOR 7.5 (GLOVE) ×6
GLOVE BIOGEL PI INDICATOR 8 (GLOVE) ×4
GLOVE ECLIPSE 7.5 STRL STRAW (GLOVE) ×6 IMPLANT
GLOVE SURG SS PI 7.5 STRL IVOR (GLOVE) ×3 IMPLANT
GOWN SPEC L3 XXLG W/TWL (GOWN DISPOSABLE) ×3 IMPLANT
GOWN STRL REUS W/TWL LRG LVL3 (GOWN DISPOSABLE) ×3 IMPLANT
GOWN STRL REUS W/TWL XL LVL3 (GOWN DISPOSABLE) ×6 IMPLANT
HOLDER FOLEY CATH W/STRAP (MISCELLANEOUS) ×3 IMPLANT
HOOD PEEL AWAY FLYTE STAYCOOL (MISCELLANEOUS) ×6 IMPLANT
PACK ANTERIOR HIP CUSTOM (KITS) ×3 IMPLANT
RTRCTR WOUND ALEXIS 18CM MED (MISCELLANEOUS) ×3
STRIP CLOSURE SKIN 1/2X4 (GAUZE/BANDAGES/DRESSINGS) ×2 IMPLANT
SUT ETHIBOND NAB CT1 #1 30IN (SUTURE) ×6 IMPLANT
SUT MNCRL AB 3-0 PS2 18 (SUTURE) ×3 IMPLANT
SUT VIC AB 0 CT1 36 (SUTURE) ×3 IMPLANT
SUT VIC AB 1 CT1 36 (SUTURE) ×6 IMPLANT
SUT VIC AB 2-0 CT1 27 (SUTURE) ×4
SUT VIC AB 2-0 CT1 TAPERPNT 27 (SUTURE) ×2 IMPLANT
TRAY FOLEY CATH SILVER 14FR (SET/KITS/TRAYS/PACK) ×3 IMPLANT
YANKAUER SUCT BULB TIP NO VENT (SUCTIONS) ×3 IMPLANT

## 2017-11-20 NOTE — Evaluation (Signed)
Physical Therapy Evaluation Patient Details Name: Lisa Odom MRN: [id number] DOB: Apr 07, 1956 Today's Date: 11/20/2017   History of Present Illness  s/p L DA THA with bone grafting;   Clinical Impression  Pt is s/p THA resulting in the deficits listed below (see PT Problem List). *pt doing well today (POD #0), amb 26' with RW and  Min/guard assist;  Pt will benefit from skilled PT to increase their independence and safety with mobility to allow discharge to the venue listed below.      Follow Up Recommendations Home health PT    Equipment Recommendations  Rolling walker with 5" wheels(petite)    Recommendations for Other Services       Precautions / Restrictions Precautions Precautions: Fall Restrictions Weight Bearing Restrictions: No Other Position/Activity Restrictions: WBAT      Mobility  Bed Mobility Overal bed mobility: Needs Assistance Bed Mobility: Supine to Sit     Supine to sit: Min assist;Min guard     General bed mobility comments: min/guard to brign LLE off bed  Transfers Overall transfer level: Needs assistance Equipment used: Rolling walker (2 wheeled) Transfers: Sit to/from Stand Sit to Stand: Min assist;Min guard         General transfer comment: cues for hand placement  Ambulation/Gait Ambulation/Gait assistance: Min guard Ambulation Distance (Feet): 80 Feet Assistive device: Rolling walker (2 wheeled) Gait Pattern/deviations: Step-to pattern     General Gait Details: cues for sequence and RW position, upward gaze  Stairs            Wheelchair Mobility    Modified Rankin (Stroke Patients Only)       Balance                                             Pertinent Vitals/Pain Pain Assessment: 0-10 Pain Score: 1  Pain Location: L hip  Pain Descriptors / Indicators: Discomfort Pain Intervention(s): Premedicated before session;Monitored during session;Limited activity within patient's tolerance     Home Living Family/patient expects to be discharged to:: Private residence Living Arrangements: Other relatives(sister) Available Help at Discharge: Family Type of Home: House Home Access: Stairs to enter Entrance Stairs-Rails: Right Entrance Stairs-Number of Steps: 4 Home Layout: One level Home Equipment: None      Prior Function Level of Independence: Independent with assistive device(s)         Comments: amb with cane     Hand Dominance        Extremity/Trunk Assessment   Upper Extremity Assessment Upper Extremity Assessment: Defer to OT evaluation;Overall WFL for tasks assessed    Lower Extremity Assessment Lower Extremity Assessment: LLE deficits/detail LLE Deficits / Details: hip grossly 2+/5, knee and ankle WFL       Communication   Communication: No difficulties  Cognition Arousal/Alertness: Awake/alert Behavior During Therapy: WFL for tasks assessed/performed Overall Cognitive Status: Within Functional Limits for tasks assessed                                        General Comments      Exercises Total Joint Exercises Ankle Circles/Pumps: AROM;Both;10 reps Quad Sets: AROM;Both;5 reps   Assessment/Plan    PT Assessment Patient needs continued PT services  PT Problem List Decreased strength;Decreased mobility;Decreased knowledge of use of DME;Pain  PT Treatment Interventions DME instruction;Gait training;Functional mobility training;Therapeutic activities;Therapeutic exercise;Patient/family education;Stair training    PT Goals (Current goals can be found in the Care Plan section)  Acute Rehab PT Goals Patient Stated Goal: get back to life PT Goal Formulation: With patient Time For Goal Achievement: 11/27/17 Potential to Achieve Goals: Good    Frequency 7X/week   Barriers to discharge        Co-evaluation               AM-PAC PT "6 Clicks" Daily Activity  Outcome Measure Difficulty turning over in  bed (including adjusting bedclothes, sheets and blankets)?: A Little Difficulty moving from lying on back to sitting on the side of the bed? : Unable Difficulty sitting down on and standing up from a chair with arms (e.g., wheelchair, bedside commode, etc,.)?: Unable Help needed moving to and from a bed to chair (including a wheelchair)?: A Little Help needed walking in hospital room?: A Little Help needed climbing 3-5 steps with a railing? : A Little 6 Click Score: 14    End of Session Equipment Utilized During Treatment: Gait belt Activity Tolerance: Patient tolerated treatment well Patient left: with call bell/phone within reach;in chair;with chair alarm set;with family/visitor present   PT Visit Diagnosis: Difficulty in walking, not elsewhere classified (R26.2)    Time: 1191-4782 PT Time Calculation (min) (ACUTE ONLY): 34 min   Charges:   PT Evaluation $PT Eval Low Complexity: 1 Low PT Treatments $Gait Training: 8-22 mins   PT G CodesKenyon Ana, PT Pager: [phone] 11/20/2017   [facility] 11/20/2017, 3:31 PM

## 2017-11-20 NOTE — Progress Notes (Signed)
Nutrition Brief Note  Patient identified on the Malnutrition Screening Tool (MST) Report  Wt Readings from Last 15 Encounters:  11/20/17 134 lb (60.8 kg)  11/13/17 134 lb (60.8 kg)  04/28/17 139 lb 12 oz (63.4 kg)  11/14/16 140 lb (63.5 kg)  06/16/16 140 lb 6 oz (63.7 kg)  06/13/16 139 lb 12 oz (63.4 kg)  05/20/16 140 lb 6 oz (63.7 kg)  04/30/16 140 lb 8 oz (63.7 kg)  04/10/16 142 lb 4.8 oz (64.5 kg)  02/28/16 143 lb 4 oz (65 kg)  12/20/15 146 lb 12 oz (66.6 kg)  10/29/15 151 lb 8 oz (68.7 kg)  08/13/15 149 lb 12.8 oz (67.9 kg)  05/22/15 151 lb 8 oz (68.7 kg)  03/19/15 155 lb 9 oz (70.6 kg)    Body mass index is 24.91 kg/m. Patient meets criteria for normal weight/borderline overweight based on current BMI. L hip incision from earlier today. Pt with hx of arthritis in L hip causing pain and functional disability. She failed 12 weeks of non-surgical management and, therefore, underwent surgery earlier today. Pt was having pain with standing to prepare meals PTA with worsening L hip pain.   Current diet order is Regular. Labs and medications reviewed.  IVF: NS @ 100 mL/hr.   Ensure Enlive ordered BID per ONS protocol, each supplement provides 350 kcal and 20 grams of protein. Pt has accepted 1 of 2 bottles offered. Will decrease order to once/day. No other nutrition interventions warranted at this time. If nutrition issues arise, please consult RD.      Jarome Matin, MS, RD, LDN, [facility] Inpatient Clinical Dietitian Pager # [phone] After hours/weekend pager # [phone]

## 2017-11-20 NOTE — Anesthesia Preprocedure Evaluation (Addendum)
Anesthesia Evaluation  Patient identified by MRN, date of birth, ID band Patient awake    Reviewed: Allergy & Precautions, NPO status , Patient's Chart, lab work & pertinent test results  Airway Mallampati: II  TM Distance: >3 FB Neck ROM: Full    Dental  (+) Teeth Intact, Dental Advisory Given   Pulmonary    breath sounds clear to auscultation       Cardiovascular hypertension,  Rhythm:Regular Rate:Normal     Neuro/Psych  Headaches, PSYCHIATRIC DISORDERS Anxiety Depression  Neuromuscular disease    GI/Hepatic hiatal hernia, PUD, GERD  Medicated,  Endo/Other  diabetes  Renal/GU      Musculoskeletal  (+) Fibromyalgia -, narcotic dependent  Abdominal Normal abdominal exam  (+)   Peds  Hematology negative hematology ROS (+)   Anesthesia Other Findings   Reproductive/Obstetrics                            Lab Results  Component Value Date   WBC 8.8 11/13/2017   HGB 12.1 11/13/2017   HCT 36.8 11/13/2017   MCV 89.8 11/13/2017   PLT 423 (H) 11/13/2017   Lab Results  Component Value Date   CREATININE 0.71 11/13/2017   BUN 20 11/13/2017   NA 142 11/13/2017   K 4.5 11/13/2017   CL 106 11/13/2017   CO2 27 11/13/2017   Lab Results  Component Value Date   INR 1.01 11/13/2017   INR 1.09 09/12/2014   EKG: normal sinus rhythm.  Anesthesia Physical Anesthesia Plan  ASA: II  Anesthesia Plan: Spinal   Post-op Pain Management:    Induction: Intravenous  PONV Risk Score and Plan: 3 and Propofol infusion, Dexamethasone and Ondansetron  Airway Management Planned: Natural Airway and Nasal Cannula  Additional Equipment: None  Intra-op Plan:   Post-operative Plan:   Informed Consent: I have reviewed the patients History and Physical, chart, labs and discussed the procedure including the risks, benefits and alternatives for the proposed anesthesia with the patient or authorized  representative who has indicated his/her understanding and acceptance.     Plan Discussed with: CRNA  Anesthesia Plan Comments:        Anesthesia Quick Evaluation

## 2017-11-20 NOTE — Brief Op Note (Signed)
11/20/2017  9:35 AM  PATIENT:  Lisa Odom  62 y.o. female  PRE-OPERATIVE DIAGNOSIS:  LEFT HIP DEGENERATIVE JOINT DISEASE WITH SEVERE PROTRUSION  POST-OPERATIVE DIAGNOSIS:  LEFT HIP DEGENERATIVE JOINT DISEASE WITH SEVERE PROTRUSION  PROCEDURE:  Procedure(s): LEFT TOTAL HIP ARTHROPLASTY ANTERIOR APPROACH WITH BONE GRAFTING AND ACETABULAR SCREWS (Left)  SURGEON:  Surgeon(s) and Role:    Dorna Leitz, MD - Primary  PHYSICIAN ASSISTANT:   ASSISTANTS: bethune   ANESTHESIA:   spinal  EBL:  500 mL   BLOOD ADMINISTERED:none  DRAINS: none   LOCAL MEDICATIONS USED:  MARCAINE    and OTHER experel  SPECIMEN:  No Specimen  DISPOSITION OF SPECIMEN:  N/A  COUNTS:  YES  TOURNIQUET:  * No tourniquets in log *  DICTATION: .Other Dictation: Dictation Number [id number]  PLAN OF CARE: Admit to inpatient   PATIENT DISPOSITION:  PACU - hemodynamically stable.   Delay start of Pharmacological VTE agent (>24hrs) due to surgical blood loss or risk of bleeding: no

## 2017-11-20 NOTE — Anesthesia Procedure Notes (Signed)
Spinal  Patient location during procedure: OR Start time: 11/20/2017 7:26 AM End time: 11/20/2017 7:28 AM Staffing Anesthesiologist: Effie Berkshire, MD Performed: anesthesiologist  Preanesthetic Checklist Completed: patient identified, site marked, surgical consent, pre-op evaluation, timeout performed, IV checked, risks and benefits discussed and monitors and equipment checked Spinal Block Patient position: sitting Prep: Betadine Patient monitoring: heart rate, continuous pulse ox, blood pressure and cardiac monitor Approach: midline Location: L4-5 Injection technique: single-shot Needle Needle type: Introducer and Pencan  Needle gauge: 24 G Needle length: 9 cm Additional Notes Negative paresthesia. Negative blood return. Positive free-flowing CSF. Expiration date of kit checked and confirmed. Patient tolerated procedure well, without complications.

## 2017-11-20 NOTE — Anesthesia Procedure Notes (Signed)
Date/Time: 11/20/2017 7:30 AM Performed by: Claudia Desanctis, CRNA Oxygen Delivery Method: Simple face mask

## 2017-11-20 NOTE — Transfer of Care (Signed)
Immediate Anesthesia Transfer of Care Note  Patient: Lisa Odom  Procedure(s) Performed: LEFT TOTAL HIP ARTHROPLASTY ANTERIOR APPROACH WITH BONE GRAFTING AND ACETABULAR SCREWS (Left Hip)  Patient Location: PACU  Anesthesia Type:Spinal  Level of Consciousness: drowsy  Airway & Oxygen Therapy: Patient Spontanous Breathing and Patient connected to face mask  Post-op Assessment: Report given to RN and Post -op Vital signs reviewed and stable  Post vital signs: Reviewed and stable  Last Vitals:  Vitals:   11/20/17 0618  BP: (!) 150/82  Pulse: 87  Resp: 18  Temp: 36.6 C  SpO2: 95%    Last Pain:  Vitals:   11/20/17 0632  TempSrc:   PainSc: 2       Patients Stated Pain Goal: 2 (03/54/65 6812)  Complications: No apparent anesthesia complications

## 2017-11-20 NOTE — Discharge Instructions (Signed)

## 2017-11-20 NOTE — Anesthesia Postprocedure Evaluation (Signed)
Anesthesia Post Note  Patient: Lisa Odom  Procedure(s) Performed: LEFT TOTAL HIP ARTHROPLASTY ANTERIOR APPROACH WITH BONE GRAFTING AND ACETABULAR SCREWS (Left Hip)     Patient location during evaluation: PACU Anesthesia Type: Spinal Level of consciousness: oriented and awake and alert Pain management: pain level controlled Vital Signs Assessment: post-procedure vital signs reviewed and stable Respiratory status: spontaneous breathing, respiratory function stable and patient connected to nasal cannula oxygen Cardiovascular status: blood pressure returned to baseline and stable Postop Assessment: no headache, no backache, no apparent nausea or vomiting, spinal receding and patient able to bend at knees Anesthetic complications: no    Last Vitals:  Vitals:   11/20/17 1250 11/20/17 1350  BP: 119/64 124/61  Pulse: 76 87  Resp: 18 16  Temp: (!) 36.4 C (!) 36.4 C  SpO2: 100% 98%    Last Pain:  Vitals:   11/20/17 1350  TempSrc: Oral  PainSc:                  Effie Berkshire

## 2017-11-21 LAB — BASIC METABOLIC PANEL
ANION GAP: 11 (ref 5–15)
BUN: 10 mg/dL (ref 6–20)
CALCIUM: 8.6 mg/dL — AB (ref 8.9–10.3)
CHLORIDE: 106 mmol/L (ref 101–111)
CO2: 22 mmol/L (ref 22–32)
Creatinine, Ser: 0.59 mg/dL (ref 0.44–1.00)
GFR calc non Af Amer: >60 mL/min (ref >60)
Glucose, Bld: 175 mg/dL — ABNORMAL HIGH (ref 65–99)
Potassium: 4.1 mmol/L (ref 3.5–5.1)
Sodium: 139 mmol/L (ref 135–145)

## 2017-11-21 LAB — CBC
HEMATOCRIT: 27.2 % — AB (ref 36.0–46.0)
HEMOGLOBIN: 9.1 g/dL — AB (ref 12.0–15.0)
MCH: 30.4 pg (ref 26.0–34.0)
MCHC: 33.5 g/dL (ref 30.0–36.0)
MCV: 91 fL (ref 78.0–100.0)
Platelets: 329 10*3/uL (ref 150–400)
RBC: 2.99 MIL/uL — ABNORMAL LOW (ref 3.87–5.11)
RDW: 13.6 % (ref 11.5–15.5)
WBC: 11.8 10*3/uL — AB (ref 4.0–10.5)

## 2017-11-21 NOTE — Care Management Note (Signed)
Case Management Note  Patient Details  Name: Lisa Odom MRN: [id number] Date of Birth: [birth year]  Subjective/Objective:   Left THA                 Action/Plan: Spoke to pt and offered choice for HH/list provided. Pt agreeable to [facility] for [facility]. Requested RW and 3n1 bedside commode for home. Contacted [facility] for DME to be delivered to room prior to dc.   Expected Discharge Date:  11/21/17               Expected Discharge Plan:  [place]  In-House Referral:  NA  Discharge planning Services  CM Consult  Post Acute Care Choice:  Home Health Choice offered to:  Patient  DME Arranged:  3-N-1, Walker rolling DME Agency:  [place]:  [place] Agency:  [facility] ([facility]  Status of Service:  Completed, signed off  If discussed at [employer], dates discussed:    Additional Comments:  Erenest Rasher, RN 11/21/2017, 12:17 PM

## 2017-11-21 NOTE — Progress Notes (Signed)
Physical Therapy Treatment Patient Details Name: Lisa Odom MRN: [id number] DOB: 01/17/1956 Today's Date: 11/21/2017    History of Present Illness s/p L DA THA with bone grafting;     PT Comments    Pt progressing but limited by dizziness; will see again in pm and determine if pt ready to d/c  Follow Up Recommendations  Home health PT     Equipment Recommendations  Rolling walker with 5" wheels(petite)    Recommendations for Other Services       Precautions / Restrictions Precautions Precautions: Fall Restrictions Weight Bearing Restrictions: No Other Position/Activity Restrictions: WBAT    Mobility  Bed Mobility Overal bed mobility: Needs Assistance Bed Mobility: Supine to Sit     Supine to sit: Min assist;Min guard     General bed mobility comments: min/guard to bring LLE off bed  Transfers Overall transfer level: Needs assistance Equipment used: Rolling walker (2 wheeled) Transfers: Sit to/from Stand Sit to Stand: Min guard;Supervision         General transfer comment: cues for hand placement  Ambulation/Gait Ambulation/Gait assistance: Min guard;Supervision Ambulation Distance (Feet): 55 Feet Assistive device: Rolling walker (2 wheeled) Gait Pattern/deviations: Step-to pattern     General Gait Details: cues for sequence and RW position, upward gaze   Stairs Stairs: Yes   Stair Management: One rail Right;One rail Left;Step to pattern;Sideways Number of Stairs: 3 General stair comments: cuesf or sequence and technique; pt became mildly diaphoretic after up/down stairs; chair brought to pt, pt reported feeling better after she was seated, RN notified   Wheelchair Mobility    Modified Rankin (Stroke Patients Only)       Balance                                            Cognition Arousal/Alertness: Awake/alert Behavior During Therapy: WFL for tasks assessed/performed Overall Cognitive Status: Within  Functional Limits for tasks assessed                                        Exercises Total Joint Exercises Ankle Circles/Pumps: AROM;Both;10 reps Quad Sets: AROM;Both;10 reps Heel Slides: AAROM;Left;10 reps Hip ABduction/ADduction: AAROM;Left;10 reps    General Comments        Pertinent Vitals/Pain Pain Assessment: 0-10 Pain Score: 4  Pain Location: L hip  Pain Descriptors / Indicators: Discomfort Pain Intervention(s): Monitored during session    Home Living                      Prior Function            PT Goals (current goals can now be found in the care plan section) Acute Rehab PT Goals Patient Stated Goal: get back to life PT Goal Formulation: With patient Time For Goal Achievement: 11/27/17 Potential to Achieve Goals: Good Progress towards PT goals: Progressing toward goals    Frequency    7X/week      PT Plan Current plan remains appropriate    Co-evaluation              AM-PAC PT "6 Clicks" Daily Activity  Outcome Measure    Difficulty moving from lying on back to sitting on the side of the bed? : A Lot Difficulty sitting down on and  standing up from a chair with arms (e.g., wheelchair, bedside commode, etc,.)?: A Little Help needed moving to and from a bed to chair (including a wheelchair)?: A Little Help needed walking in hospital room?: A Little Help needed climbing 3-5 steps with a railing? : A Little 6 Click Score: 14    End of Session Equipment Utilized During Treatment: Gait belt Activity Tolerance: Patient tolerated treatment well Patient left: with call bell/phone within reach;in chair;with chair alarm set;with family/visitor present   PT Visit Diagnosis: Difficulty in walking, not elsewhere classified (R26.2)     Time: 8421-0312 PT Time Calculation (min) (ACUTE ONLY): 23 min  Charges:  $Gait Training: 8-22 mins $Therapeutic Exercise: 8-22 mins                    G CodesKenyon Ana,  PT Pager: [phone] 11/21/2017    Kenyon Ana 11/21/2017, 1:49 PM

## 2017-11-21 NOTE — Progress Notes (Signed)
Discharged from floor via w/c for transport home by car. Belongings & family with pt. No changes in assessment. Renise Gillies  

## 2017-11-21 NOTE — Progress Notes (Signed)
   11/21/17 1500  PT Visit Information  Last PT Received On 11/21/17--pt making excellent progress; feels ready for d/c with famil; nursing staff to switch out RW since the incorrect walker was delivered  Assistance Needed +1  History of Present Illness s/p L DA THA with bone grafting;   Subjective Data  Patient Stated Goal get back to life  Precautions  Precautions Fall  Restrictions  Other Position/Activity Restrictions WBAT  Pain Assessment  Pain Assessment 0-10  Pain Score 4  Pain Location L hip   Pain Descriptors / Indicators Discomfort  Pain Intervention(s) Monitored during session  Cognition  Arousal/Alertness Awake/alert  Behavior During Therapy WFL for tasks assessed/performed  Overall Cognitive Status Within Functional Limits for tasks assessed  Bed Mobility  Bed Mobility Sit to Supine;Supine to Sit  Supine to sit Supervision;Modified independent (Device/Increase time)  Sit to supine Supervision;Modified independent (Device/Increase time)  Transfers  Equipment used Rolling walker (2 wheeled)  Transfers Sit to/from Stand  Sit to [employer] transfer comment cues for hand placement  Ambulation/Gait  Ambulation/Gait assistance Supervision;Modified independent (Device/Increase time)  Ambulation Distance (Feet) 220 Feet (10' more )  Assistive device Rolling walker (2 wheeled)  Gait Pattern/deviations Step-to pattern  General Gait Details cues for sequence and RW position, upward gaze  Stairs assistance Supervision  Stair Management One rail Right;One rail Left;Step to pattern;Sideways  Number of Stairs 3 (x2)  General stair comments cues for sequence and safe technique  PT - End of Session  Equipment Utilized During Treatment Gait belt  Activity Tolerance Patient tolerated treatment well  Patient left Other (comment);with nursing/sitter in room (standing in room with family)  PT - Assessment/Plan  PT Plan Current plan remains appropriate  PT Visit  Diagnosis Difficulty in walking, not elsewhere classified (R26.2)  PT Frequency (ACUTE ONLY) 7X/week  Follow Up Recommendations Home health PT  PT equipment Rolling walker with 5" wheels (petite)  AM-PAC PT "6 Clicks" Daily Activity Outcome Measure  Difficulty turning over in bed (including adjusting bedclothes, sheets and blankets)? 3  Difficulty moving from lying on back to sitting on the side of the bed?  4  Difficulty sitting down on and standing up from a chair with arms (e.g., wheelchair, bedside commode, etc,.)? 3  Help needed moving to and from a bed to chair (including a wheelchair)? 3  Help needed walking in hospital room? 3  Help needed climbing 3-5 steps with a railing?  3  6 Click Score 19  Mobility G Code  CJ  PT Goal Progression  Progress towards PT goals Progressing toward goals  Acute Rehab PT Goals  PT Goal Formulation With patient  Time For Goal Achievement 11/27/17  Potential to Achieve Goals Good  PT Time Calculation  PT Start Time (ACUTE ONLY) 1414  PT Stop Time (ACUTE ONLY) 1447  PT Time Calculation (min) (ACUTE ONLY) 33 min  PT General Charges  $$ ACUTE PT VISIT 1 Visit  PT Treatments  $Gait Training 23-37 mins

## 2017-11-21 NOTE — Discharge Summary (Signed)
Patient ID: Lisa Odom MRN: [id number] DOB/AGE: 64-Mar-1957 64 y.o.  Admit date: 11/20/2017 Discharge date: 11/21/2017  Admission Diagnoses:  Principal Problem:   Primary osteoarthritis of left hip Active Problems:   Intrapelvic protrusion of left acetabulum   Discharge Diagnoses:  Same  Past Medical History:  Diagnosis Date  . Anxiety   . Contact lens/glasses fitting    wears contacts or glasses  . Depression   . Diabetes mellitus without complication (HCC)    No meds  . Diverticulosis   . Family history of adverse reaction to anesthesia    pts sister has severe N/V  . Fibromyalgia   . Gastritis   . GERD (gastroesophageal reflux disease)   . Hemorrhoid   . Hiatal hernia    neuropathy  . Migraine   . Neuropathy   . Panic attacks   . Pneumonia    as an infant    Surgeries: Procedure(s): LEFT TOTAL HIP ARTHROPLASTY ANTERIOR APPROACH WITH BONE GRAFTING AND ACETABULAR SCREWS on 11/20/2017   Consultants:   Discharged Condition: Improved  Hospital Course: Lisa Odom is an 62 y.o. female who was admitted 11/20/2017 for operative treatment ofPrimary osteoarthritis of left hip. Patient has severe unremitting pain that affects sleep, daily activities, and work/hobbies. After pre-op clearance the patient was taken to the operating room on 11/20/2017 and underwent  Procedure(s): LEFT TOTAL HIP ARTHROPLASTY ANTERIOR APPROACH WITH BONE GRAFTING AND ACETABULAR SCREWS.    Patient was given perioperative antibiotics:  Anti-infectives (From admission, onward)   Start     Dose/Rate Route Frequency Ordered Stop   11/20/17 1400  ceFAZolin (ANCEF) IVPB 2g/100 mL premix     2 g 200 mL/hr over 30 Minutes Intravenous Every 6 hours 11/20/17 1044 11/20/17 2057   11/20/17 0549  ceFAZolin (ANCEF) IVPB 2g/100 mL premix     2 g 200 mL/hr over 30 Minutes Intravenous On call to O.R. 11/20/17 6503 11/20/17 0740       Patient was given sequential compression devices, early  ambulation, and chemoprophylaxis to prevent DVT.  Patient benefited maximally from hospital stay and there were no complications.    Recent vital signs:  Patient Vitals for the past 24 hrs:  BP Temp Temp src Pulse Resp SpO2  11/21/17 0553 107/64 98.2 F (36.8 C) Oral 88 20 97 %  11/21/17 0215 (!) 116/44 98.6 F (37 C) Oral 99 20 99 %  11/20/17 2015 (!) 105/57 98.6 F (37 C) Oral 86 16 98 %  11/20/17 1350 124/61 (!) 97.5 F (36.4 C) Oral 87 16 98 %  11/20/17 1250 119/64 (!) 97.5 F (36.4 C) Axillary 76 18 100 %  11/20/17 1150 122/68 (!) 97.4 F (36.3 C) Axillary 77 18 100 %  11/20/17 1050 (!) 129/98 (!) 97.5 F (36.4 C) - 71 15 100 %  11/20/17 1030 108/71 (!) 97.4 F (36.3 C) - 72 (!) 22 100 %  11/20/17 1015 (!) 108/59 - - 77 13 100 %  11/20/17 1000 103/62 - - 77 14 100 %  11/20/17 0945 (!) 152/79 - - 60 12 100 %  11/20/17 0943 (!) 154/74 98 F (36.7 C) - 62 12 100 %     Recent laboratory studies:  Recent Labs    11/21/17 0617  WBC 11.8*  HGB 9.1*  HCT 27.2*  PLT 329  NA 139  K 4.1  CL 106  CO2 22  BUN 10  CREATININE 0.59  GLUCOSE 175*  CALCIUM 8.6*  Discharge Medications:   Allergies as of 11/21/2017      Reactions   Hydrochlorothiazide Rash   Sulfonamide Derivatives Rash      Medication List    STOP taking these medications   HYDROmorphone 2 MG tablet Commonly known as:  DILAUDID   meloxicam 15 MG tablet Commonly known as:  MOBIC   tiZANidine 4 MG capsule Commonly known as:  ZANAFLEX Replaced by:  tiZANidine 4 MG tablet     TAKE these medications   ALPRAZolam 0.5 MG tablet Commonly known as:  XANAX Take 0.5 mg by mouth 4 (four) times daily as needed for anxiety.   amphetamine-dextroamphetamine 30 MG tablet Commonly known as:  ADDERALL Take 30 mg by mouth 2 (two) times daily. In the morning & early afternoon   aspirin EC 325 MG tablet Take 1 tablet (325 mg total) by mouth 2 (two) times daily after a meal. Take x 1 month post op to  decrease risk of blood clots. What changed:    medication strength  how much to take  when to take this  additional instructions   aspirin-acetaminophen-caffeine 250-250-65 MG tablet Commonly known as:  EXCEDRIN MIGRAINE Take 2 tablets by mouth 2 (two) times daily as needed for headache.   atorvastatin 40 MG tablet Commonly known as:  LIPITOR Take 1 tablet (40 mg total) by mouth daily.   b complex vitamins capsule Take 1 capsule by mouth every 3 (three) days.   buPROPion 150 MG 24 hr tablet Commonly known as:  WELLBUTRIN XL Take 150 mg by mouth daily.   CALCIUM-VITAMIN D PO Take 1-2 tablets by mouth 4 (four) times daily. 1 in the morning, 2 at lunch, 2 in the later afternoon, and 1 tablet at bedtime.   docusate sodium 100 MG capsule Commonly known as:  COLACE Take 1 capsule (100 mg total) by mouth 2 (two) times daily.   DULoxetine 60 MG capsule Commonly known as:  CYMBALTA TAKE 2 CAPSULES (120 MG TOTAL) BY MOUTH DAILY. What changed:    how much to take  how to take this  when to take this  additional instructions   FISH OIL PO Take 1 capsule by mouth daily.   gabapentin 600 MG tablet Commonly known as:  NEURONTIN TAKE 1/2 TABLET TWICE DAILY AND 2 TABS AT BEDTIME What changed:    how much to take  how to take this  when to take this  additional instructions   multivitamin tablet Take 1 tablet by mouth daily.   oxyCODONE-acetaminophen 5-325 MG tablet Commonly known as:  PERCOCET/ROXICET Take 1-2 tablets by mouth every 6 (six) hours as needed for severe pain.   pantoprazole 40 MG tablet Commonly known as:  PROTONIX Take 1 tablet (40 mg total) by mouth daily.   tiZANidine 4 MG tablet Commonly known as:  ZANAFLEX Take 0.5 tablets (2 mg total) by mouth every 8 (eight) hours as needed for muscle spasms. Replaces:  tiZANidine 4 MG capsule   TURMERIC COMPLEX/BLACK PEPPER PO Take 1 capsule by mouth daily.       Diagnostic Studies: Dg Chest 2  View  Result Date: 11/14/2017 CLINICAL DATA:  Preop hip surgery.  No current chest complaints. EXAM: CHEST  2 VIEW COMPARISON:  09/13/2014 FINDINGS: The heart size and mediastinal contours are within normal limits. Both lungs are clear. Previous anterior plate and screw fixation of the cervical spine. IMPRESSION: 1. No acute findings.  No active cardiopulmonary abnormalities. Electronically Signed   By: Kerby Moors  M.D.   On: 11/14/2017 12:37   Dg Pelvis Portable  Result Date: 11/20/2017 CLINICAL DATA:  Left hip arthroplasty. EXAM: PORTABLE PELVIS 1-2 VIEWS COMPARISON:  Fluoroscopy from earlier today FINDINGS: Total left hip arthroplasty that is located and intact in this single projection. Bone about the left acetabulum that is likely from grafting. There was protrusio deformity on a 2017 lumbar spine radiography. There is artifact from bedding. Expected soft tissue gas about the left hip. IMPRESSION: Left hip arthroplasty and acetabular grafting. Stable appearance compared to fluoroscopy. Electronically Signed   By: Monte Fantasia M.D.   On: 11/20/2017 10:17   Dg C-arm 1-60 Min-no Report  Result Date: 11/20/2017 Fluoroscopy was utilized by the requesting physician.  No radiographic interpretation.   Dg Hip Port Unilat With Pelvis 1v Left  Result Date: 11/20/2017 CLINICAL DATA:  Status post left hip replacement today. EXAM: DG HIP (WITH OR WITHOUT PELVIS) 1V PORT LEFT COMPARISON:  None. FINDINGS: Total left hip replacement is identified. The device is located. No fracture. IMPRESSION: Status post left total hip replacement.  No acute finding. Electronically Signed   By: Inge Rise M.D.   On: 11/20/2017 14:17    Disposition: 01-Home or Self Care  Discharge Instructions    Call MD / Call 911   Complete by:  As directed    If you experience chest pain or shortness of breath, CALL 911 and be transported to the hospital emergency room.  If you develope a fever above 101 F, pus (white  drainage) or increased drainage or redness at the wound, or calf pain, call your surgeon's office.   Constipation Prevention   Complete by:  As directed    Drink plenty of fluids.  Prune juice may be helpful.  You may use a stool softener, such as Colace (over the counter) 100 mg twice a day.  Use MiraLax (over the counter) for constipation as needed.   Diet - low sodium heart healthy   Complete by:  As directed    Discharge instructions   Complete by:  As directed    INSTRUCTIONS AFTER JOINT REPLACEMENT   Remove items at home which could result in a fall. This includes throw rugs or furniture in walking pathways ICE to the affected joint every three hours while awake for 30 minutes at a time, for at least the first 3-5 days, and then as needed for pain and swelling.  Continue to use ice for pain and swelling. You may notice swelling that will progress down to the foot and ankle.  This is normal after surgery.  Elevate your leg when you are not up walking on it.   Continue to use the breathing machine you got in the hospital (incentive spirometer) which will help keep your temperature down.  It is common for your temperature to cycle up and down following surgery, especially at night when you are not up moving around and exerting yourself.  The breathing machine keeps your lungs expanded and your temperature down.   DIET:  As you were doing prior to hospitalization, we recommend a well-balanced diet.  DRESSING / WOUND CARE / SHOWERING  You may shower 3 days after surgery, but keep the wounds dry during showering.  You may use an occlusive plastic wrap (Press'n Seal for example), NO SOAKING/SUBMERGING IN THE BATHTUB.  If the bandage gets wet, change with a clean dry gauze.  If the incision gets wet, pat the wound dry with a clean towel.  ACTIVITY  Increase activity slowly as tolerated, but follow the weight bearing instructions below.   No driving for 6 weeks or until further direction given  by your physician.  You cannot drive while taking narcotics.  No lifting or carrying greater than 10 lbs. until further directed by your surgeon. Avoid periods of inactivity such as sitting longer than an hour when not asleep. This helps prevent blood clots.  You may return to work once you are authorized by your doctor.     WEIGHT BEARING   Weight bearing as tolerated with assist device (walker, cane, etc) as directed, use it as long as suggested by your surgeon or therapist, typically at least 4-6 weeks.   EXERCISES  Results after joint replacement surgery are often greatly improved when you follow the exercise, range of motion and muscle strengthening exercises prescribed by your doctor. Safety measures are also important to protect the joint from further injury. Any time any of these exercises cause you to have increased pain or swelling, decrease what you are doing until you are comfortable again and then slowly increase them. If you have problems or questions, call your caregiver or physical therapist for advice.   Rehabilitation is important following a joint replacement. After just a few days of immobilization, the muscles of the leg can become weakened and shrink (atrophy).  These exercises are designed to build up the tone and strength of the thigh and leg muscles and to improve motion. Often times heat used for twenty to thirty minutes before working out will loosen up your tissues and help with improving the range of motion but do not use heat for the first two weeks following surgery (sometimes heat can increase post-operative swelling).   These exercises can be done on a training (exercise) mat, on the floor, on a table or on a bed. Use whatever works the best and is most comfortable for you.    Use music or television while you are exercising so that the exercises are a pleasant break in your day. This will make your life better with the exercises acting as a break in your routine  that you can look forward to.   Perform all exercises about fifteen times, three times per day or as directed.  You should exercise both the operative leg and the other leg as well.   Exercises include:   Quad Sets - Tighten up the muscle on the front of the thigh (Quad) and hold for 5-10 seconds.   Straight Leg Raises - With your knee straight (if you were given a brace, keep it on), lift the leg to 60 degrees, hold for 3 seconds, and slowly lower the leg.  Perform this exercise against resistance later as your leg gets stronger.  Leg Slides: Lying on your back, slowly slide your foot toward your buttocks, bending your knee up off the floor (only go as far as is comfortable). Then slowly slide your foot back down until your leg is flat on the floor again.  Angel Wings: Lying on your back spread your legs to the side as far apart as you can without causing discomfort.  Hamstring Strength:  Lying on your back, push your heel against the floor with your leg straight by tightening up the muscles of your buttocks.  Repeat, but this time bend your knee to a comfortable angle, and push your heel against the floor.  You may put a pillow under the heel to make it more comfortable if necessary.  A rehabilitation program following joint replacement surgery can speed recovery and prevent re-injury in the future due to weakened muscles. Contact your doctor or a physical therapist for more information on knee rehabilitation.    CONSTIPATION  Constipation is defined medically as fewer than three stools per week and severe constipation as less than one stool per week.  Even if you have a regular bowel pattern at home, your normal regimen is likely to be disrupted due to multiple reasons following surgery.  Combination of anesthesia, postoperative narcotics, change in appetite and fluid intake all can affect your bowels.   YOU MUST use at least one of the following options; they are listed in order of increasing  strength to get the job done.  They are all available over the counter, and you may need to use some, POSSIBLY even all of these options:    Drink plenty of fluids (prune juice may be helpful) and high fiber foods Colace 100 mg by mouth twice a day  Senokot for constipation as directed and as needed Dulcolax (bisacodyl), take with full glass of water  Miralax (polyethylene glycol) once or twice a day as needed.  If you have tried all these things and are unable to have a bowel movement in the first 3-4 days after surgery call either your surgeon or your primary doctor.    If you experience loose stools or diarrhea, hold the medications until you stool forms back up.  If your symptoms do not get better within 1 week or if they get worse, check with your doctor.  If you experience "the worst abdominal pain ever" or develop nausea or vomiting, please contact the office immediately for further recommendations for treatment.   ITCHING:  If you experience itching with your medications, try taking only a single pain pill, or even half a pain pill at a time.  You can also use Benadryl over the counter for itching or also to help with sleep.   TED HOSE STOCKINGS:  Use stockings on both legs until for at least 2 weeks or as directed by physician office. They may be removed at night for sleeping.  MEDICATIONS:  See your medication summary on the "After Visit Summary" that nursing will review with you.  You may have some home medications which will be placed on hold until you complete the course of blood thinner medication.  It is important for you to complete the blood thinner medication as prescribed.  PRECAUTIONS:  If you experience chest pain or shortness of breath - call 911 immediately for transfer to the hospital emergency department.   If you develop a fever greater that 101 F, purulent drainage from wound, increased redness or drainage from wound, foul odor from the wound/dressing, or calf pain -  CONTACT YOUR SURGEON.                                                   FOLLOW-UP APPOINTMENTS:  If you do not already have a post-op appointment, please call the office for an appointment to be seen by your surgeon.  Guidelines for how soon to be seen are listed in your "After Visit Summary", but are typically between 1-4 weeks after surgery.  OTHER INSTRUCTIONS:   Knee Replacement:  Do not place pillow under knee, focus on keeping the knee straight while resting.  CPM instructions: 0-90 degrees, 2 hours in the morning, 2 hours in the afternoon, and 2 hours in the evening. Place foam block, curve side up under heel at all times except when in CPM or when walking.  DO NOT modify, tear, cut, or change the foam block in any way.  MAKE SURE YOU:  Understand these instructions.  Get help right away if you are not doing well or get worse.    Thank you for letting us be a part of your medical care team.  It is a privilege we respect greatly.  We hope these instructions will help you stay on track for a fast and full recovery!   Increase activity slowly as tolerated   Complete by:  As directed       Follow-up Information    Dorna Leitz, MD. Schedule an appointment as soon as possible for a visit in 2 weeks.   Specialty:  Orthopedic Surgery Contact information: [place] [address] [phone]            Signed: Rich Fuchs 11/21/2017, 8:46 AM

## 2017-11-21 NOTE — Progress Notes (Signed)
Subjective: 1 Day Post-Op Procedure(s) (LRB): LEFT TOTAL HIP ARTHROPLASTY ANTERIOR APPROACH WITH BONE GRAFTING AND ACETABULAR SCREWS (Left)   Patient in a little more pain than yesterday but she would still like to try to go home if cleared by PT.  Activity level:  wbat Diet tolerance:  ok Voiding:  ok Patient reports pain as mild.    Objective: Vital signs in last 24 hours: Temp:  [97.4 F (36.3 C)-98.6 F (37 C)] 98.2 F (36.8 C) (02/23 0553) Pulse Rate:  [60-99] 88 (02/23 0553) Resp:  [12-22] 20 (02/23 0553) BP: (103-154)/(44-98) 107/64 (02/23 0553) SpO2:  [97 %-100 %] 97 % (02/23 0553)  Labs: Recent Labs    11/21/17 0617  HGB 9.1*   Recent Labs    11/21/17 0617  WBC 11.8*  RBC 2.99*  HCT 27.2*  PLT 329   Recent Labs    11/21/17 0617  NA 139  K 4.1  CL 106  CO2 22  BUN 10  CREATININE 0.59  GLUCOSE 175*  CALCIUM 8.6*   No results for input(s): LABPT, INR in the last 72 hours.  Physical Exam:  Neurologically intact ABD soft Neurovascular intact Sensation intact distally Intact pulses distally Dorsiflexion/Plantar flexion intact Incision: dressing C/D/I and no drainage No cellulitis present Compartment soft  Assessment/Plan:  1 Day Post-Op Procedure(s) (LRB): LEFT TOTAL HIP ARTHROPLASTY ANTERIOR APPROACH WITH BONE GRAFTING AND ACETABULAR SCREWS (Left) Advance diet Up with therapy D/C IV fluids Discharge home with home health either today or tomorrow if cleared by PT. Continue on ASA 325mg  BID for DVT prevention. Follow up in office with Dr. Berenice Primas as scheduled. Keep bandage clean and dry until follow up.  Naika Noto, Larwance Sachs 11/21/2017, 8:42 AM

## 2017-11-23 DIAGNOSIS — M4712 Other spondylosis with myelopathy, cervical region: Secondary | ICD-10-CM | POA: Diagnosis not present

## 2017-11-23 DIAGNOSIS — M4802 Spinal stenosis, cervical region: Secondary | ICD-10-CM | POA: Diagnosis not present

## 2017-11-23 DIAGNOSIS — M797 Fibromyalgia: Secondary | ICD-10-CM | POA: Diagnosis not present

## 2017-11-23 DIAGNOSIS — Z471 Aftercare following joint replacement surgery: Secondary | ICD-10-CM | POA: Diagnosis not present

## 2017-11-23 DIAGNOSIS — K253 Acute gastric ulcer without hemorrhage or perforation: Secondary | ICD-10-CM | POA: Diagnosis not present

## 2017-11-23 DIAGNOSIS — M5442 Lumbago with sciatica, left side: Secondary | ICD-10-CM | POA: Diagnosis not present

## 2017-11-23 DIAGNOSIS — G8929 Other chronic pain: Secondary | ICD-10-CM | POA: Diagnosis not present

## 2017-11-23 DIAGNOSIS — E1149 Type 2 diabetes mellitus with other diabetic neurological complication: Secondary | ICD-10-CM | POA: Diagnosis not present

## 2017-11-23 DIAGNOSIS — Z96642 Presence of left artificial hip joint: Secondary | ICD-10-CM | POA: Diagnosis not present

## 2017-11-23 DIAGNOSIS — I10 Essential (primary) hypertension: Secondary | ICD-10-CM | POA: Diagnosis not present

## 2017-11-23 DIAGNOSIS — K5732 Diverticulitis of large intestine without perforation or abscess without bleeding: Secondary | ICD-10-CM | POA: Diagnosis not present

## 2017-11-23 NOTE — Op Note (Signed)
NAMEKAYTELYNN, SCRIPTER                 ACCOUNT NO.:  [id number]  MEDICAL RECORD NO.:  [id number]  LOCATION:                                 FACILITY:  PHYSICIAN:  Alta Corning, M.D.        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  PROCEDURES: 1. Left total hip replacement with a Corail stem size 10, a 50-mm     Pinnacle Gription cup, a +4 neutral liner for 32-mm hip ball, and a     +1.5 delta ceramic 32-mm hip ball. 2. Complexity of surgical intervention secondary to severe protrusio     acetabulum requiring bone grafting and screw fixation of the cup. 3. Interpretation of multiple intraoperative fluoroscopic images.  SURGEON:  Alta Corning, MD.  ASSISTModena Slater.  ANESTHESIA:  General.  BRIEF HISTORY:  Ms. Paris Lore is a 62 year old female with a long history of significant severe left hip pain.  She was evaluated in our office and noted to have severe hip arthritis with severe protrusio acetabulum. We had a long discussion about treatment options including the potential complications relative to the severe complex nature of her problem, and ultimately after severe pain and problems long term, she elected to come to the operating room for total hip replacement.  This is a complex case and required significant amounts of preoperative planning including consultation with a number of physicians and formulation of preoperative planning including multiple strategies should the initial plan fail. She was brought to the operating room for this procedure.  DESCRIPTION OF PROCEDURE:  The patient was brought to the operative room, and after adequate anesthesia was obtained with spinal anesthetic, the patient was placed  supine on the operating table and moved onto the Hana bed.  All bony prominences were well padded.  Attention was then turned to the left hip where after routine prep and drape, an incision made for an anterior approach to the hip.  Subcutaneous tissue was taken down the level of the tensor fascia, which was identified and divided in line with its fibers and retractors were put in place above and below the femoral neck.  The capsule was then opened and tagged after traction had been put on the hip helping to pull ball out of the pelvis.  Once the retractor was put in place and the anterior capsule had been released, attention was turned towards provisional neck cut followed by removal of the head and this was sent to the back table.  At this time, retractors were put in place above and below the acetabulum and a labrectomy was performed.  Once this was completed, we turned to the acetabulum and very carefully and with the assistance of fluoroscopic guidance which had been carefully evaluated in position preoperatively, we began our gentle reaming process.  We basically got a reamer to the perfect position in the hip and once we got at that perfect position, we basically were not reaming  anything with the first reamer.  We then ran up to size and basically we got the reamer where it needed to be and then reamed the rim fit that was appropriate for this patient.  This was with a 49 reamer.  We got excellent rim fit there.  We took the head to the back table and reamed it thoroughly until we got down to all the bone that we could get out of the femoral head.  I felt that this was enough for her bone grafting and gave [place] about 2 tablespoons of bone. We took this and put it into the cup and reverse reamed it and at this time took the cup.  We took a cup trial initially just to make sure we were going to be hanging up and in the right position.  We took a Pinnacle Gription cup with sector of  holes for screw placement and then hammered the cup into place, 30 degrees of anteversion and 45 degrees of lateral opening.  Once we were satisfied with the position of the cup, I did put 2 screws and although the cup fit, it actually was actually quite good.  I put 2 screws superiorly in the superior dome to help give additional fixation.  Once this was done, I put a +4 neutral liner and attention was then turned to the stem side.  We used a cookie cutter. We actually rotated the hip and extended it and then put the Hana bed arm behind it, elevating the hip into the field.  We then used a cookie cutter followed by a chili pepper followed by a rasp, rasped her up to a 10 and then did a trial reduction.  She was slightly long, but wonderfully stable.  We then felt that we were committed to the 10 and shortened this as much as we could and then put the shortest ceramic hip ball on there.  Once we did that, she may have been a couple of millimeters long, but I think she has a protrusio on the opposite side and so actually felt like her leg length was appropriate and her stability was perfect.  At this point, we irrigated the case and irrigated the hip and then closed the capsule with 1 Vicryl running, the tensor fascia with 1 Vicryl running, the skin with 0 and 2-0 Vicryl and 3-0 Monocryl subcuticular.  Benzoin and Steri-Strips were applied. Sterile compressive dressing was applied, and the patient was taken to the recovery room where she was noted to be in satisfactory condition.  This was a fairly complex case involving significant amounts of preoperative planning.  Intraoperatively, we had to do significant amounts of fluoroscopic imaging that we do not typically do relative to getting appropriate positioning of the cup.  We bone grafted the cup and we put screws in the cup, all of which are complexities not normally needed in a primary hip replacement.  We were satisfied that  the additional work and time of the surgery which took approximately twice as long as a normal hip replacement were important and adequate to give her the excellent result, which we wanted to achieve for her.  At this point, she was taken to Recovery where she was noted to be in satisfactory condition.  Estimated blood loss for procedure was 400 mL, but the final can be gotten from anesthetic record.     Alta Corning, M.D.     Corliss Skains  D:  11/20/2017  T:  11/20/2017  Job:  [id number]

## 2017-11-24 ENCOUNTER — Encounter: Payer: Self-pay | Admitting: Family Medicine

## 2017-11-25 DIAGNOSIS — G8929 Other chronic pain: Secondary | ICD-10-CM | POA: Diagnosis not present

## 2017-11-25 DIAGNOSIS — Z471 Aftercare following joint replacement surgery: Secondary | ICD-10-CM | POA: Diagnosis not present

## 2017-11-25 DIAGNOSIS — M4712 Other spondylosis with myelopathy, cervical region: Secondary | ICD-10-CM | POA: Diagnosis not present

## 2017-11-25 DIAGNOSIS — Z96642 Presence of left artificial hip joint: Secondary | ICD-10-CM | POA: Diagnosis not present

## 2017-11-25 DIAGNOSIS — K253 Acute gastric ulcer without hemorrhage or perforation: Secondary | ICD-10-CM | POA: Diagnosis not present

## 2017-11-25 DIAGNOSIS — M797 Fibromyalgia: Secondary | ICD-10-CM | POA: Diagnosis not present

## 2017-11-25 DIAGNOSIS — K5732 Diverticulitis of large intestine without perforation or abscess without bleeding: Secondary | ICD-10-CM | POA: Diagnosis not present

## 2017-11-25 DIAGNOSIS — E1149 Type 2 diabetes mellitus with other diabetic neurological complication: Secondary | ICD-10-CM | POA: Diagnosis not present

## 2017-11-25 DIAGNOSIS — M5442 Lumbago with sciatica, left side: Secondary | ICD-10-CM | POA: Diagnosis not present

## 2017-11-25 DIAGNOSIS — M4802 Spinal stenosis, cervical region: Secondary | ICD-10-CM | POA: Diagnosis not present

## 2017-11-25 DIAGNOSIS — I10 Essential (primary) hypertension: Secondary | ICD-10-CM | POA: Diagnosis not present

## 2017-11-25 NOTE — Telephone Encounter (Signed)
Pt called - she needs a meter, strips, and lancets called into [facility] [address].  cb [phone] Pt is unsure which brand of meter her ins covers

## 2017-11-27 DIAGNOSIS — M4712 Other spondylosis with myelopathy, cervical region: Secondary | ICD-10-CM | POA: Diagnosis not present

## 2017-11-27 DIAGNOSIS — I10 Essential (primary) hypertension: Secondary | ICD-10-CM | POA: Diagnosis not present

## 2017-11-27 DIAGNOSIS — M5442 Lumbago with sciatica, left side: Secondary | ICD-10-CM | POA: Diagnosis not present

## 2017-11-27 DIAGNOSIS — K5732 Diverticulitis of large intestine without perforation or abscess without bleeding: Secondary | ICD-10-CM | POA: Diagnosis not present

## 2017-11-27 DIAGNOSIS — G8929 Other chronic pain: Secondary | ICD-10-CM | POA: Diagnosis not present

## 2017-11-27 DIAGNOSIS — K253 Acute gastric ulcer without hemorrhage or perforation: Secondary | ICD-10-CM | POA: Diagnosis not present

## 2017-11-27 DIAGNOSIS — E1149 Type 2 diabetes mellitus with other diabetic neurological complication: Secondary | ICD-10-CM | POA: Diagnosis not present

## 2017-11-27 DIAGNOSIS — Z96642 Presence of left artificial hip joint: Secondary | ICD-10-CM | POA: Diagnosis not present

## 2017-11-27 DIAGNOSIS — M4802 Spinal stenosis, cervical region: Secondary | ICD-10-CM | POA: Diagnosis not present

## 2017-11-27 DIAGNOSIS — Z471 Aftercare following joint replacement surgery: Secondary | ICD-10-CM | POA: Diagnosis not present

## 2017-11-27 DIAGNOSIS — M797 Fibromyalgia: Secondary | ICD-10-CM | POA: Diagnosis not present

## 2017-11-27 NOTE — Telephone Encounter (Signed)
Pt called her insurance company and they told her   accu-check [employer] view or accu-check guide or avila

## 2017-11-30 DIAGNOSIS — Z471 Aftercare following joint replacement surgery: Secondary | ICD-10-CM | POA: Diagnosis not present

## 2017-11-30 DIAGNOSIS — I10 Essential (primary) hypertension: Secondary | ICD-10-CM | POA: Diagnosis not present

## 2017-11-30 DIAGNOSIS — K5732 Diverticulitis of large intestine without perforation or abscess without bleeding: Secondary | ICD-10-CM | POA: Diagnosis not present

## 2017-11-30 DIAGNOSIS — G8929 Other chronic pain: Secondary | ICD-10-CM | POA: Diagnosis not present

## 2017-11-30 DIAGNOSIS — Z96642 Presence of left artificial hip joint: Secondary | ICD-10-CM | POA: Diagnosis not present

## 2017-11-30 DIAGNOSIS — M4802 Spinal stenosis, cervical region: Secondary | ICD-10-CM | POA: Diagnosis not present

## 2017-11-30 DIAGNOSIS — E1149 Type 2 diabetes mellitus with other diabetic neurological complication: Secondary | ICD-10-CM | POA: Diagnosis not present

## 2017-11-30 DIAGNOSIS — K253 Acute gastric ulcer without hemorrhage or perforation: Secondary | ICD-10-CM | POA: Diagnosis not present

## 2017-11-30 DIAGNOSIS — M4712 Other spondylosis with myelopathy, cervical region: Secondary | ICD-10-CM | POA: Diagnosis not present

## 2017-11-30 DIAGNOSIS — M5442 Lumbago with sciatica, left side: Secondary | ICD-10-CM | POA: Diagnosis not present

## 2017-11-30 DIAGNOSIS — M797 Fibromyalgia: Secondary | ICD-10-CM | POA: Diagnosis not present

## 2017-12-01 ENCOUNTER — Telehealth: Payer: Self-pay | Admitting: Family Medicine

## 2017-12-01 NOTE — Telephone Encounter (Signed)
Copied from [place] [id number]. Topic: Inquiry >> Dec 01, 2017  3:17 PM Tashema Tiller, [place], [place] wrote: Reason for CRM: Patient is calling to check the status of her receiving a glucose monitor, strips and lancets.  Patient has been waiting for a call back since last Friday. She would like to have those things sent to the [facility] in [address]. If someone could give her a call back about this to let her know that it has been taking care of for her at [phone]

## 2017-12-02 DIAGNOSIS — Z471 Aftercare following joint replacement surgery: Secondary | ICD-10-CM | POA: Diagnosis not present

## 2017-12-02 DIAGNOSIS — K253 Acute gastric ulcer without hemorrhage or perforation: Secondary | ICD-10-CM | POA: Diagnosis not present

## 2017-12-02 DIAGNOSIS — I10 Essential (primary) hypertension: Secondary | ICD-10-CM | POA: Diagnosis not present

## 2017-12-02 DIAGNOSIS — M4802 Spinal stenosis, cervical region: Secondary | ICD-10-CM | POA: Diagnosis not present

## 2017-12-02 DIAGNOSIS — K5732 Diverticulitis of large intestine without perforation or abscess without bleeding: Secondary | ICD-10-CM | POA: Diagnosis not present

## 2017-12-02 DIAGNOSIS — Z96642 Presence of left artificial hip joint: Secondary | ICD-10-CM | POA: Diagnosis not present

## 2017-12-02 DIAGNOSIS — G8929 Other chronic pain: Secondary | ICD-10-CM | POA: Diagnosis not present

## 2017-12-02 DIAGNOSIS — M4712 Other spondylosis with myelopathy, cervical region: Secondary | ICD-10-CM | POA: Diagnosis not present

## 2017-12-02 DIAGNOSIS — M797 Fibromyalgia: Secondary | ICD-10-CM | POA: Diagnosis not present

## 2017-12-02 DIAGNOSIS — M5442 Lumbago with sciatica, left side: Secondary | ICD-10-CM | POA: Diagnosis not present

## 2017-12-02 DIAGNOSIS — E1149 Type 2 diabetes mellitus with other diabetic neurological complication: Secondary | ICD-10-CM | POA: Diagnosis not present

## 2017-12-02 MED ORDER — GLUCOSE BLOOD VI STRP: ORAL_STRIP | 11 refills | Status: DC

## 2017-12-02 MED ORDER — ACCU-CHEK SOFT TOUCH LANCETS MISC: 12 refills | Status: DC

## 2017-12-02 MED ORDER — ACCU-CHEK NANO SMARTVIEW W/DEVICE KIT: PACK | 0 refills | Status: DC

## 2017-12-02 NOTE — Telephone Encounter (Addendum)
I never received emailed message on Friday about what meter was covered by insurance. Yesterdays message was routed to [facility] RN Triage Pool so I never received message yesterday.  I have sent in Rx for Accu-Chek Nano Smartview Meter, test strips and lancets to [facility] on [address] as requested.  Tried to call Ms. Foxworthy but her mailbox was full so I was unable to leave a message.

## 2017-12-02 NOTE — Telephone Encounter (Signed)
Patient calling back to check on her status of receiving a glucose meter. Noted in an email conversation by Dr. Diona Browner on 11/27/17 for patient to call insurance company to see which meter is covered. Noted 11/27/17:  Pt called her insurance company and they told her  accu-check [employer] view or accu-check guide or avila

## 2017-12-02 NOTE — Telephone Encounter (Signed)
Ms. Payer notified by telephone that I have sent in Rx for Accu-Chek Nano Smartview meter, test strips and lancets have been sent to her pharmacy.

## 2017-12-03 DIAGNOSIS — M1612 Unilateral primary osteoarthritis, left hip: Secondary | ICD-10-CM | POA: Diagnosis not present

## 2017-12-03 DIAGNOSIS — Z471 Aftercare following joint replacement surgery: Secondary | ICD-10-CM | POA: Diagnosis not present

## 2017-12-03 DIAGNOSIS — Z96642 Presence of left artificial hip joint: Secondary | ICD-10-CM | POA: Diagnosis not present

## 2017-12-04 DIAGNOSIS — E1149 Type 2 diabetes mellitus with other diabetic neurological complication: Secondary | ICD-10-CM | POA: Diagnosis not present

## 2017-12-04 DIAGNOSIS — K5732 Diverticulitis of large intestine without perforation or abscess without bleeding: Secondary | ICD-10-CM | POA: Diagnosis not present

## 2017-12-04 DIAGNOSIS — M4802 Spinal stenosis, cervical region: Secondary | ICD-10-CM | POA: Diagnosis not present

## 2017-12-04 DIAGNOSIS — Z96642 Presence of left artificial hip joint: Secondary | ICD-10-CM | POA: Diagnosis not present

## 2017-12-04 DIAGNOSIS — M797 Fibromyalgia: Secondary | ICD-10-CM | POA: Diagnosis not present

## 2017-12-04 DIAGNOSIS — Z471 Aftercare following joint replacement surgery: Secondary | ICD-10-CM | POA: Diagnosis not present

## 2017-12-04 DIAGNOSIS — M5442 Lumbago with sciatica, left side: Secondary | ICD-10-CM | POA: Diagnosis not present

## 2017-12-04 DIAGNOSIS — G8929 Other chronic pain: Secondary | ICD-10-CM | POA: Diagnosis not present

## 2017-12-04 DIAGNOSIS — K253 Acute gastric ulcer without hemorrhage or perforation: Secondary | ICD-10-CM | POA: Diagnosis not present

## 2017-12-04 DIAGNOSIS — I10 Essential (primary) hypertension: Secondary | ICD-10-CM | POA: Diagnosis not present

## 2017-12-04 DIAGNOSIS — M4712 Other spondylosis with myelopathy, cervical region: Secondary | ICD-10-CM | POA: Diagnosis not present

## 2017-12-07 DIAGNOSIS — M25552 Pain in left hip: Secondary | ICD-10-CM | POA: Diagnosis not present

## 2017-12-07 DIAGNOSIS — R260 Ataxic gait: Secondary | ICD-10-CM | POA: Diagnosis not present

## 2017-12-07 DIAGNOSIS — Z471 Aftercare following joint replacement surgery: Secondary | ICD-10-CM | POA: Diagnosis not present

## 2017-12-11 DIAGNOSIS — R260 Ataxic gait: Secondary | ICD-10-CM | POA: Diagnosis not present

## 2017-12-11 DIAGNOSIS — M25552 Pain in left hip: Secondary | ICD-10-CM | POA: Diagnosis not present

## 2017-12-11 DIAGNOSIS — Z471 Aftercare following joint replacement surgery: Secondary | ICD-10-CM | POA: Diagnosis not present

## 2017-12-16 DIAGNOSIS — Z471 Aftercare following joint replacement surgery: Secondary | ICD-10-CM | POA: Diagnosis not present

## 2017-12-16 DIAGNOSIS — R260 Ataxic gait: Secondary | ICD-10-CM | POA: Diagnosis not present

## 2017-12-16 DIAGNOSIS — M25552 Pain in left hip: Secondary | ICD-10-CM | POA: Diagnosis not present

## 2017-12-18 DIAGNOSIS — R260 Ataxic gait: Secondary | ICD-10-CM | POA: Diagnosis not present

## 2017-12-18 DIAGNOSIS — M25552 Pain in left hip: Secondary | ICD-10-CM | POA: Diagnosis not present

## 2017-12-18 DIAGNOSIS — Z471 Aftercare following joint replacement surgery: Secondary | ICD-10-CM | POA: Diagnosis not present

## 2017-12-21 DIAGNOSIS — Z471 Aftercare following joint replacement surgery: Secondary | ICD-10-CM | POA: Diagnosis not present

## 2017-12-21 DIAGNOSIS — R260 Ataxic gait: Secondary | ICD-10-CM | POA: Diagnosis not present

## 2017-12-21 DIAGNOSIS — M25552 Pain in left hip: Secondary | ICD-10-CM | POA: Diagnosis not present

## 2017-12-23 DIAGNOSIS — R260 Ataxic gait: Secondary | ICD-10-CM | POA: Diagnosis not present

## 2017-12-23 DIAGNOSIS — M25552 Pain in left hip: Secondary | ICD-10-CM | POA: Diagnosis not present

## 2017-12-23 DIAGNOSIS — Z471 Aftercare following joint replacement surgery: Secondary | ICD-10-CM | POA: Diagnosis not present

## 2017-12-25 DIAGNOSIS — Z471 Aftercare following joint replacement surgery: Secondary | ICD-10-CM | POA: Diagnosis not present

## 2017-12-25 DIAGNOSIS — M25552 Pain in left hip: Secondary | ICD-10-CM | POA: Diagnosis not present

## 2017-12-25 DIAGNOSIS — R260 Ataxic gait: Secondary | ICD-10-CM | POA: Diagnosis not present

## 2017-12-28 ENCOUNTER — Telehealth: Payer: Self-pay | Admitting: Family Medicine

## 2017-12-28 NOTE — Telephone Encounter (Signed)
Copied from [place]. Topic: Quick Communication - See Telephone Encounter >> Dec 28, 2017  3:54 PM Vernona Rieger wrote: CRM for notification. See Telephone encounter for: 12/28/17.  Pt is requesting to have her labs done A1C and cholesterol. She said that Dr Diona Browner told her to make an appt but no orders in. Please advise

## 2017-12-28 NOTE — Telephone Encounter (Signed)
Copied from [place]. Topic: Quick Communication - See Telephone Encounter >> Dec 28, 2017  3:54 PM Vernona Rieger wrote: CRM for notification. See Telephone encounter for: 12/28/17.  Pt is requesting to have her labs done A1C and cholesterol. She said that Dr Diona Browner told her to make an appt but no orders in. Please advise >> Dec 28, 2017  4:14 PM Oliver Pila B wrote: Pt called to say to disregard the last message

## 2017-12-29 DIAGNOSIS — R260 Ataxic gait: Secondary | ICD-10-CM | POA: Diagnosis not present

## 2017-12-29 DIAGNOSIS — M25552 Pain in left hip: Secondary | ICD-10-CM | POA: Diagnosis not present

## 2017-12-29 DIAGNOSIS — Z471 Aftercare following joint replacement surgery: Secondary | ICD-10-CM | POA: Diagnosis not present

## 2017-12-31 DIAGNOSIS — M25552 Pain in left hip: Secondary | ICD-10-CM | POA: Diagnosis not present

## 2017-12-31 DIAGNOSIS — Z471 Aftercare following joint replacement surgery: Secondary | ICD-10-CM | POA: Diagnosis not present

## 2017-12-31 DIAGNOSIS — R260 Ataxic gait: Secondary | ICD-10-CM | POA: Diagnosis not present

## 2018-01-20 ENCOUNTER — Other Ambulatory Visit: Payer: Self-pay | Admitting: Family Medicine

## 2018-01-20 NOTE — Telephone Encounter (Signed)
Last office visit 04/28/2017.  Not on current medication list.  Refill?

## 2018-01-25 ENCOUNTER — Other Ambulatory Visit: Payer: Self-pay | Admitting: Family Medicine

## 2018-01-25 NOTE — Telephone Encounter (Signed)
Last office visit 04/10/2017.  Last refilled 07/21/2017 for #270 with 3 refills.  Ok to refill?

## 2018-01-26 NOTE — Telephone Encounter (Signed)
Rx faxed to [facility] on [address] at  [phone].

## 2018-02-04 DIAGNOSIS — H524 Presbyopia: Secondary | ICD-10-CM | POA: Diagnosis not present

## 2018-02-04 DIAGNOSIS — H3581 Retinal edema: Secondary | ICD-10-CM | POA: Diagnosis not present

## 2018-02-04 DIAGNOSIS — H52221 Regular astigmatism, right eye: Secondary | ICD-10-CM | POA: Diagnosis not present

## 2018-02-04 DIAGNOSIS — H5203 Hypermetropia, bilateral: Secondary | ICD-10-CM | POA: Diagnosis not present

## 2018-02-04 DIAGNOSIS — E119 Type 2 diabetes mellitus without complications: Secondary | ICD-10-CM | POA: Diagnosis not present

## 2018-02-04 LAB — HM DIABETES EYE EXAM

## 2018-02-09 DIAGNOSIS — M25561 Pain in right knee: Secondary | ICD-10-CM | POA: Diagnosis not present

## 2018-02-09 DIAGNOSIS — M25562 Pain in left knee: Secondary | ICD-10-CM | POA: Diagnosis not present

## 2018-02-09 DIAGNOSIS — M25552 Pain in left hip: Secondary | ICD-10-CM | POA: Diagnosis not present

## 2018-02-12 ENCOUNTER — Encounter: Payer: Self-pay | Admitting: Family Medicine

## 2018-02-17 ENCOUNTER — Encounter: Payer: Self-pay | Admitting: Family Medicine

## 2018-02-17 LAB — HM DIABETES EYE EXAM

## 2018-02-18 ENCOUNTER — Encounter: Payer: Self-pay | Admitting: Family Medicine

## 2018-02-18 ENCOUNTER — Other Ambulatory Visit: Payer: Self-pay | Admitting: Family Medicine

## 2018-02-18 MED ORDER — DULOXETINE HCL 60 MG PO CPEP: ORAL_CAPSULE | ORAL | 0 refills | Status: DC

## 2018-02-18 NOTE — Telephone Encounter (Signed)
Last office visit 04/28/2017.  Last refilled Meloxicam 01/21/2018 for #90 with no refills.  Gabapentin 01/26/2018 for #270 with no refills.

## 2018-03-03 LAB — FECAL OCCULT BLOOD, GUAIAC: Fecal Occult Blood: POSITIVE

## 2018-03-06 ENCOUNTER — Encounter (HOSPITAL_COMMUNITY): Payer: Self-pay | Admitting: *Deleted

## 2018-03-06 ENCOUNTER — Other Ambulatory Visit: Payer: Self-pay

## 2018-03-06 ENCOUNTER — Ambulatory Visit (HOSPITAL_COMMUNITY)
Admission: EM | Admit: 2018-03-06 | Discharge: 2018-03-06 | Disposition: A | Discharge status: Home / Self Care | Payer: Medicare Other | Attending: Internal Medicine | Admitting: Internal Medicine

## 2018-03-06 DIAGNOSIS — L509 Urticaria, unspecified: Secondary | ICD-10-CM | POA: Diagnosis not present

## 2018-03-06 MED ORDER — METHYLPREDNISOLONE SODIUM SUCC 125 MG IJ SOLR
INTRAMUSCULAR | Status: AC
  Filled 2018-03-06: qty 2

## 2018-03-06 MED ORDER — METHYLPREDNISOLONE SODIUM SUCC 125 MG IJ SOLR
125.0000 mg | Freq: Once | INTRAMUSCULAR | Status: AC
  Administered 2018-03-06: 125 mg via INTRAMUSCULAR

## 2018-03-06 NOTE — ED Provider Notes (Signed)
[place]    CSN: [id number] Arrival date & time: 03/06/18  1212     History   Chief Complaint Chief Complaint  Patient presents with  . Urticaria    HPI Lisa Odom is a 62 y.o. female.   CC: hives started 3 days ago, worsening. Has been going through graduations, 'very stressed' Took xanax with some relief so wouldn't scratch so much.. Had burger 3 days ago which is slightly unusual for her.   Started on chest. Now on arms, back, hands, feet, spreading neck. icthy Took allegra yesterday which some relief. Takes allegra for allergies, seasonal such as ragweed. Uses flonase for sinus pressure for past couple of weeks, with some improvement. No fever, congestion, ear pain, sinus pain.   No recent tick bite. Hard to swollen as feels bumps on tongue. 'Feels rough'. No CP, cough, drooling.  Notes gets hives 'every now and then', every few years. No food allergies. Never had   Allergies to hctz, sulfa.   Not on any antihypertensives ( including ACE)  GERD- on protonix. Not on pepcid or zantac.      Past Medical History:  Diagnosis Date  . Anxiety   . Contact lens/glasses fitting    wears contacts or glasses  . Depression   . Diabetes mellitus without complication (HCC)    No meds  . Diverticulosis   . Family history of adverse reaction to anesthesia    pts sister has severe N/V  . Fibromyalgia   . Gastritis   . GERD (gastroesophageal reflux disease)   . Hemorrhoid   . Hiatal hernia    neuropathy  . Migraine   . Neuropathy   . Panic attacks   . Pneumonia    as an infant    Patient Active Problem List   Diagnosis Date Noted  . Primary osteoarthritis of left hip 11/20/2017  . Intrapelvic protrusion of left acetabulum 11/20/2017  . Left arm pain 01/09/2017  . Shoulder blade pain 11/14/2016  . Bilateral bunions 06/13/2016  . Toenail fungus 06/13/2016  . Bilateral low back pain with left-sided sciatica 06/13/2016  . Slurred speech  04/30/2016  . Confusion 04/30/2016  . Balance problem 04/10/2016  . Left hip pain 12/20/2015  . Memory loss 12/20/2015  . Osteopenia 05/29/2015  . Fibromyalgia 05/22/2015  . Severe recurrent major depression without psychotic features ([place]) 12/06/2014    Class: Chronic  . Spinal stenosis of cervical region 09/12/2014  . Stenosis of cervical spine with myelopathy ([place]) 09/12/2014  . Acute respiratory acidosis   . Cervical (neck) region somatic dysfunction   . Stenosis of cervical spine   . Elevated CK 08/17/2014  . Chronic neck pain 08/17/2014  . Bilateral anterior knee pain 11/04/2013  . 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 Bilateral foot pain 02/22/2013  . Bilateral leg pain, nighttime 11/30/2012  . Carpal tunnel syndrome of left wrist 11/30/2012  . Panic attack 02/16/2012  . GERD (gastroesophageal reflux disease) 09/05/2011  . ESSENTIAL HYPERTENSION, BENIGN 04/27/2009  . HYPERCHOLESTEROLEMIA 12/19/2008  . Major depressive disorder, recurrent episode, severe ([place]) 12/19/2008  . MIGRAINE, COMMON 12/19/2008  . ALLERGIC RHINITIS 12/19/2008  . GASTRIC ULCER, ACUTE 12/19/2008  . DIVERTICULITIS, COLON 12/19/2008    Past Surgical History:  Procedure Laterality Date  . ANTERIOR CERVICAL DECOMP/DISCECTOMY FUSION N/A 09/12/2014   Procedure: Evacuation of cervical hematoma;  Surgeon: [name], MD;  Location: [facility];  Service: Neurosurgery;  Laterality: N/A;  .  ANTERIOR CERVICAL DECOMP/DISCECTOMY FUSION N/A 09/12/2014   Procedure: CERVICAL FOUR-FIVE, CERVICAL FIVE-SIX, CERVICAL SIX-SEVEN ANTERIOR CERVICAL DECOMPRESSION/DISCECTOMY FUSION;  Surgeon: [name], MD;  Location: [facility];  Service: Neurosurgery;  Laterality: N/A;  . BUNIONECTOMY    . CARPAL TUNNEL RELEASE  2002   right  . COLONOSCOPY    . JOINT REPLACEMENT    . SHOULDER ARTHROSCOPY WITH ROTATOR CUFF REPAIR  2010   right  . TOTAL HIP ARTHROPLASTY Left  11/20/2017   Procedure: LEFT TOTAL HIP ARTHROPLASTY ANTERIOR APPROACH WITH BONE GRAFTING AND ACETABULAR SCREWS;  Surgeon: Dorna Leitz, MD;  Location: [facility];  Service: Orthopedics;  Laterality: Left;  . TRIGGER FINGER RELEASE Right 03/23/2013   Procedure: RELEASE TRIGGER FINGER/A-1 PULLEY RIGHT RING FINGER;  Surgeon: [name], MD;  Location: [facility];  Service: Orthopedics;  Laterality: Right;  . TUBAL LIGATION    . UPPER GI ENDOSCOPY      OB History   None      Home Medications    Prior to Admission medications   Medication Sig Start Date End Date Taking? Authorizing Provider  ALPRAZolam Duanne Moron) 0.5 MG tablet Take 0.5 mg by mouth 4 (four) times daily as needed for anxiety.   Yes [provider]  amphetamine-dextroamphetamine (ADDERALL) 30 MG tablet Take 30 mg by mouth 2 (two) times daily. In the morning & early afternoon 06/06/16  Yes [provider]  aspirin EC 325 MG tablet Take 1 tablet (325 mg total) by mouth 2 (two) times daily after a meal. Take x 1 month post op to decrease risk of blood clots. 11/20/17  Yes Gary Fleet, PA-C  atorvastatin (LIPITOR) 40 MG tablet Take 1 tablet (40 mg total) by mouth daily. 07/21/17  Yes Bedsole, Amy E, MD  b complex vitamins capsule Take 1 capsule by mouth every 3 (three) days.    Yes [provider]  Black Pepper-Turmeric (TURMERIC COMPLEX/BLACK PEPPER PO) Take 1 capsule by mouth daily.    Yes [provider]  buPROPion (WELLBUTRIN XL) 150 MG 24 hr tablet Take 150 mg by mouth daily. 10/13/15  Yes [provider]  CALCIUM-VITAMIN D PO Take 1-2 tablets by mouth 4 (four) times daily. 1 in the morning, 2 at lunch, 2 in the later afternoon, and 1 tablet at bedtime.   Yes [provider]  DULoxetine (CYMBALTA) 60 MG capsule TAKE 2 CAPSULES (120 MG TOTAL) BY MOUTH DAILY. 02/18/18  Yes Bedsole, Amy E, MD  gabapentin (NEURONTIN) 600 MG tablet Take 0.5-2 tablets (300-1,200 mg total) by  mouth 3 (three) times daily. Take 0.5 tablet (300 mg) by mouth in the morning & early afternoon, then 2 tablets (1200 mg) by mouth at bedtime. 01/26/18  Yes Bedsole, Amy E, MD  meloxicam (MOBIC) 15 MG tablet Take 1 tablet (15 mg total) by mouth at bedtime. 01/21/18  Yes Bedsole, Amy E, MD  Multiple Vitamin (MULTIVITAMIN) tablet Take 1 tablet by mouth daily.   Yes [provider]  Omega-3 Fatty Acids (FISH OIL PO) Take 1 capsule by mouth daily.   Yes [provider]  pantoprazole (PROTONIX) 40 MG tablet Take 1 tablet (40 mg total) by mouth daily. 11/05/17  Yes Bedsole, Amy E, MD  tiZANidine (ZANAFLEX) 4 MG tablet Take 0.5 tablets (2 mg total) by mouth every 8 (eight) hours as needed for muscle spasms. 11/20/17  Yes Gary Fleet, PA-C  aspirin-acetaminophen-caffeine (EXCEDRIN MIGRAINE) [phone] MG tablet Take 2 tablets by mouth 2 (two) times  daily as needed for headache.    [provider]  Blood Glucose Monitoring Suppl (ACCU-CHEK NANO SMARTVIEW) w/Device KIT Use to check blood sugar three times a day.  Dx: E11.40 12/02/17   Bedsole, Amy E, MD  glucose blood (ACCU-CHEK SMARTVIEW) test strip Use to check blood sugar three times a day.  Dx: E11.40 12/02/17   [name], MD  Lancets (ACCU-CHEK SOFT TOUCH) lancets Use to check blood sugar three times a day.  Dx: E11.40 12/02/17   [name], MD    Family History Family History  Problem Relation Age of Onset  . Diabetes Sister   . Anxiety disorder Sister   . Heart disease Brother   . Ovarian cancer Cousin   . Breast cancer Maternal Grandmother        grandmother  . Diabetes Maternal Aunt        aunt  . Alcohol abuse Maternal Uncle        uncle  . Diabetes Paternal Aunt   . Alcohol abuse Paternal Uncle     Social History Social History   Tobacco Use  . Smoking status: Never Smoker  . Smokeless tobacco: Never Used  Substance Use Topics  . Alcohol use: No    Alcohol/week: 0.0 oz  . Drug use: No      Allergies   Hydrochlorothiazide and Sulfonamide derivatives   Review of Systems Review of Systems  Constitutional: Negative for chills and fever.  HENT: Positive for sinus pressure and trouble swallowing. Negative for congestion, sore throat and voice change.   Respiratory: Negative for cough, shortness of breath and wheezing.   Cardiovascular: Negative for chest pain and palpitations.  Gastrointestinal: Negative for nausea and vomiting.     Physical Exam Triage Vital Signs ED Triage Vitals [03/06/18 1259]  Enc Vitals Group     BP 131/83     Pulse Rate 93     Resp 16     Temp 98.6 F (37 C)     Temp src      SpO2 100 %     Weight      Height      Head Circumference      Peak Flow      Pain Score 0     Pain Loc      Pain Edu?      Excl. in [place]?    No data found.  Updated Vital Signs BP 131/83   Pulse 93   Temp 98.6 F (37 C)   Resp 16   SpO2 100%   Visual Acuity Right Eye Distance:   Left Eye Distance:   Bilateral Distance:    Right Eye Near:   Left Eye Near:    Bilateral Near:     Physical Exam  Constitutional: She appears well-developed and well-nourished.  HENT:  Head: Normocephalic and atraumatic.  Right Ear: Hearing, tympanic membrane, external ear and ear canal normal. No drainage, swelling or tenderness. No foreign bodies. Tympanic membrane is not erythematous and not bulging. No middle ear effusion. No decreased hearing is noted.  Left Ear: Hearing, tympanic membrane, external ear and ear canal normal. No drainage, swelling or tenderness. No foreign bodies. Tympanic membrane is not erythematous and not bulging.  No middle ear effusion. No decreased hearing is noted.  Nose: Nose normal. No rhinorrhea. Right sinus exhibits no maxillary sinus tenderness and no frontal sinus tenderness. Left sinus exhibits no maxillary sinus tenderness and no frontal sinus tenderness.  Mouth/Throat: Uvula  is midline, oropharynx is clear and moist and mucous  membranes are normal. No oropharyngeal exudate, posterior oropharyngeal edema, posterior oropharyngeal erythema or tonsillar abscesses.  Eyes: Conjunctivae are normal.  Cardiovascular: Regular rhythm, normal heart sounds and normal pulses.  Pulmonary/Chest: Effort normal and breath sounds normal. She has no wheezes. She has no rhonchi. She has no rales.  Lymphadenopathy:       Head (right side): No submental, no submandibular, no tonsillar, no preauricular, no posterior auricular and no occipital adenopathy present.       Head (left side): No submental, no submandibular, no tonsillar, no preauricular, no posterior auricular and no occipital adenopathy present.    She has no cervical adenopathy.  Neurological: She is alert.  Skin: Skin is warm and dry.  Pruritic, circumscribed, raised erythematous maculopapular lesions over trunk, bilateral arms, nape of neck, bilateral lower legs. Lesions grouped together and < 1cm in diameter. No purulent discharge or increased warmth appreciated.   Psychiatric: She has a normal mood and affect. Her speech is normal and behavior is normal. Thought content normal.  Vitals reviewed.    UC Treatments / Results  Labs (all labs ordered are listed, but only abnormal results are displayed) Labs Reviewed - No data to display  EKG None  Radiology No results found.  Procedures Procedures (including critical care time)  Medications Ordered in UC Medications  methylPREDNISolone sodium succinate (SOLU-MEDROL) 125 mg/2 mL injection 125 mg (125 mg Intramuscular Given 03/06/18 1427)    Initial Impression / Assessment and Plan / UC Course  I have reviewed the triage vital signs and the nursing notes.  Pertinent labs & imaging results that were available during my care of the patient were reviewed by me and considered in my medical decision making (see chart for details).      Final Clinical Impressions(s) / UC Diagnoses   Final diagnoses:  Hives   Etiology of urticaria is nonspecific at this time.  Reassuring HEENT exam. No acute respiratory distress  Suspect inducible likely by stress which patient notes was quite high on day of onset. It has been 4 days since onset, patient does note a  'rough' feeling of her tongue however she does not have any trouble swallowing, shortness of breath, wheezing.  I given her 125 mg Solu-Medrol today, and asked her to stay for 30 minutes to an hour for observation patient politely declines that she is feeling well and she is ready to go home.  I think this is reasonable as a suspect if she was to have airway compromise, this would have already occurred.   Advised her she may take antihistamine, histamine 2 blocker.  Patient verbalized understanding.  She also understands to return for further evaluation if any concern of airway compromise in ED.    Discharge Instructions     Hives of unknown cause.  Please stay extra vigilant for any worsening symptoms as we discussed.  Most importantly any shortness of breath, drooling, coughing, swelling of the eyes, lips, tongue, these are the indicators that you need to see seek emergent help with such as call 911 or go to nearest emergency room.  Tips on Aggravating factors -    These include:  ?Physical factors -  As an example, heat (hot showers, extreme humidity) is a common trigger for many , and tight clothing or straps can also aggravate symptoms.   ?Anti-inflammatory medications - Nonsteroidal anti-inflammatory drugs (NSAIDs) worsen symptoms   ?Stress - Patients often report more severe symptoms  during periods of physical or psychologic stress   ?Variations in dietary habits and alcohol - Although food allergy is a rare cause of, some patients will report that variations in diet, particularly rich meals or spicy foods, will aggravate symptoms. Alcohol also aggravates symptoms in some.  If hives do not improve, please let me know and remember :   Histamine  1 blocker - Either benadryl every 8 hours (may be different dosing/duration depending on formulation, please follow directions on bottle)                                         OR  claritin, allegra, or zyrtec once daily  Histamine 2 blocker- Pepcid AC or Zantac twice a day      ED Prescriptions    None     Controlled Substance Prescriptions [place] Controlled Substance Registry consulted? Not Applicable   Burnard Hawthorne, [place] 03/06/18 [phone]

## 2018-03-06 NOTE — Discharge Instructions (Addendum)
Hives of unknown cause.  Please stay extra vigilant for any worsening symptoms as we discussed.  Most importantly any shortness of breath, drooling, coughing, swelling of the eyes, lips, tongue, these are the indicators that you need to see seek emergent help with such as call 911 or go to nearest emergency room.  Tips on Aggravating factors --    These include:  ?Physical factors -  As an example, heat (hot showers, extreme humidity) is a common trigger for many , and tight clothing or straps can also aggravate symptoms.   ?Anti-inflammatory medications - Nonsteroidal anti-inflammatory drugs (NSAIDs) worsen symptoms   ?Stress - Patients often report more severe symptoms during periods of physical or psychologic stress   ?Variations in dietary habits and alcohol - Although food allergy is a rare cause of, some patients will report that variations in diet, particularly rich meals or spicy foods, will aggravate symptoms. Alcohol also aggravates symptoms in some.  If hives do not improve, please let me know and remember :   Histamine 1 blocker - Either benadryl every 8 hours (may be different dosing/duration depending on formulation, please follow directions on bottle)                                         OR  claritin, allegra, or zyrtec once daily  Histamine 2 blocker- Pepcid AC or Zantac twice a day

## 2018-03-06 NOTE — ED Triage Notes (Signed)
C/O starting with urticaria 2 days ago; unsure what she is reacting to.  Has not been taking any meds for hives.

## 2018-03-08 ENCOUNTER — Ambulatory Visit: Payer: Medicare Other | Admitting: Family Medicine

## 2018-03-08 ENCOUNTER — Encounter: Payer: Self-pay | Admitting: Family Medicine

## 2018-03-08 DIAGNOSIS — L509 Urticaria, unspecified: Secondary | ICD-10-CM

## 2018-03-08 MED ORDER — PREDNISONE 20 MG PO TABS
ORAL_TABLET | ORAL | 0 refills | Status: DC
Start: 2018-03-08 — End: 2018-06-16

## 2018-03-08 MED ORDER — FEXOFENADINE HCL 180 MG PO TABS: 180.0000 mg | ORAL_TABLET | Freq: Every day | ORAL | Status: DC

## 2018-03-08 NOTE — Patient Instructions (Addendum)
Start prednisone, stop meloxicam for now. Keep taking allegra.   Update [place] as needed.  I would avoid pork and beef for now, though those may not be the issue.   Update Dr. Diona Browner in a few days, sooner if needed.  Take care.  Glad to see you.

## 2018-03-08 NOTE — Progress Notes (Signed)
She had eaten a burger and that was atypical.  Then she had hives thereafter.  She did not previously have a beef allergy. No known recent tick bites.  She doesn't eat much mammalian meat at baseline.  She rarely eats beef.  She usually eats [place] more.    She is still itching.  No fevers.  She didn't get much relief from steroid at [facility] recently.  No lip or tongue swelling.    H/o hives but not usually this severe or as long.    Using xanax as needed for anxiety.    Meds, vitals, and allergies reviewed.   ROS: Per HPI unless specifically indicated in ROS section   GEN: nad, alert and oriented HEENT: mucous membranes moist NECK: supple w/o LA, no stridor.  CV: rrr.  PULM: ctab, no inc wob ABD: soft, +bs EXT: no edema SKIN: She has diffuse irregular distribution of hives with maculopapular erythematous wheals on the extremities and trunk.  No lip or tongue swelling.

## 2018-03-09 DIAGNOSIS — L509 Urticaria, unspecified: Secondary | ICD-10-CM | POA: Insufficient documentation

## 2018-03-09 DIAGNOSIS — Z96642 Presence of left artificial hip joint: Secondary | ICD-10-CM | POA: Diagnosis not present

## 2018-03-09 DIAGNOSIS — M1711 Unilateral primary osteoarthritis, right knee: Secondary | ICD-10-CM | POA: Diagnosis not present

## 2018-03-09 NOTE — Assessment & Plan Note (Signed)
She does not typically have a beef allergy.  She has a history of hives in the past.  No recent tick bites.  I would have her avoid beef in the meantime along with mammalian meat, just in case.  Routine ER cautions discussed with patient.  Start prednisone taper.  She is already taking Allegra.  Update me as needed.  She agrees.  Okay for outpatient follow-up.  No lip or tongue swelling.

## 2018-03-23 ENCOUNTER — Telehealth: Payer: Self-pay | Admitting: *Deleted

## 2018-03-23 NOTE — Telephone Encounter (Signed)
Received positive fecal Immunochemical test results from [place].  Per Dr. Diona Browner patient needs a colonoscopy.  Left message for patient to call back to discuss results.  OK to  [facility] Triage Nurse to give results and find out if patient is agreeable to a referral for colonoscopy.

## 2018-03-24 ENCOUNTER — Encounter: Payer: Self-pay | Admitting: *Deleted

## 2018-03-24 NOTE — Telephone Encounter (Signed)
Left message for Lisa Odom to return my call.  I have also sent a MyChart message to patient as well in regards to the positive stool test results we received from [place].

## 2018-03-26 NOTE — Telephone Encounter (Signed)
Yes need to send certified letter given need for colonoscopy.

## 2018-03-26 NOTE — Telephone Encounter (Signed)
Tried to reach Lisa Odom again today. Her mailbox is full so I am unable to leave a message.  Will forward note back to Dr. Diona Browner to see if she wants to send a certified letter at this point.

## 2018-03-27 IMAGING — CR DG CHEST 2V
2 series · 2 of 2 positions shown · non-contrast
Comparison: 09/13/2014

CLINICAL DATA: Preop hip surgery.  No current chest complaints.

EXAM:
CHEST  2 VIEW

[w chest pa]
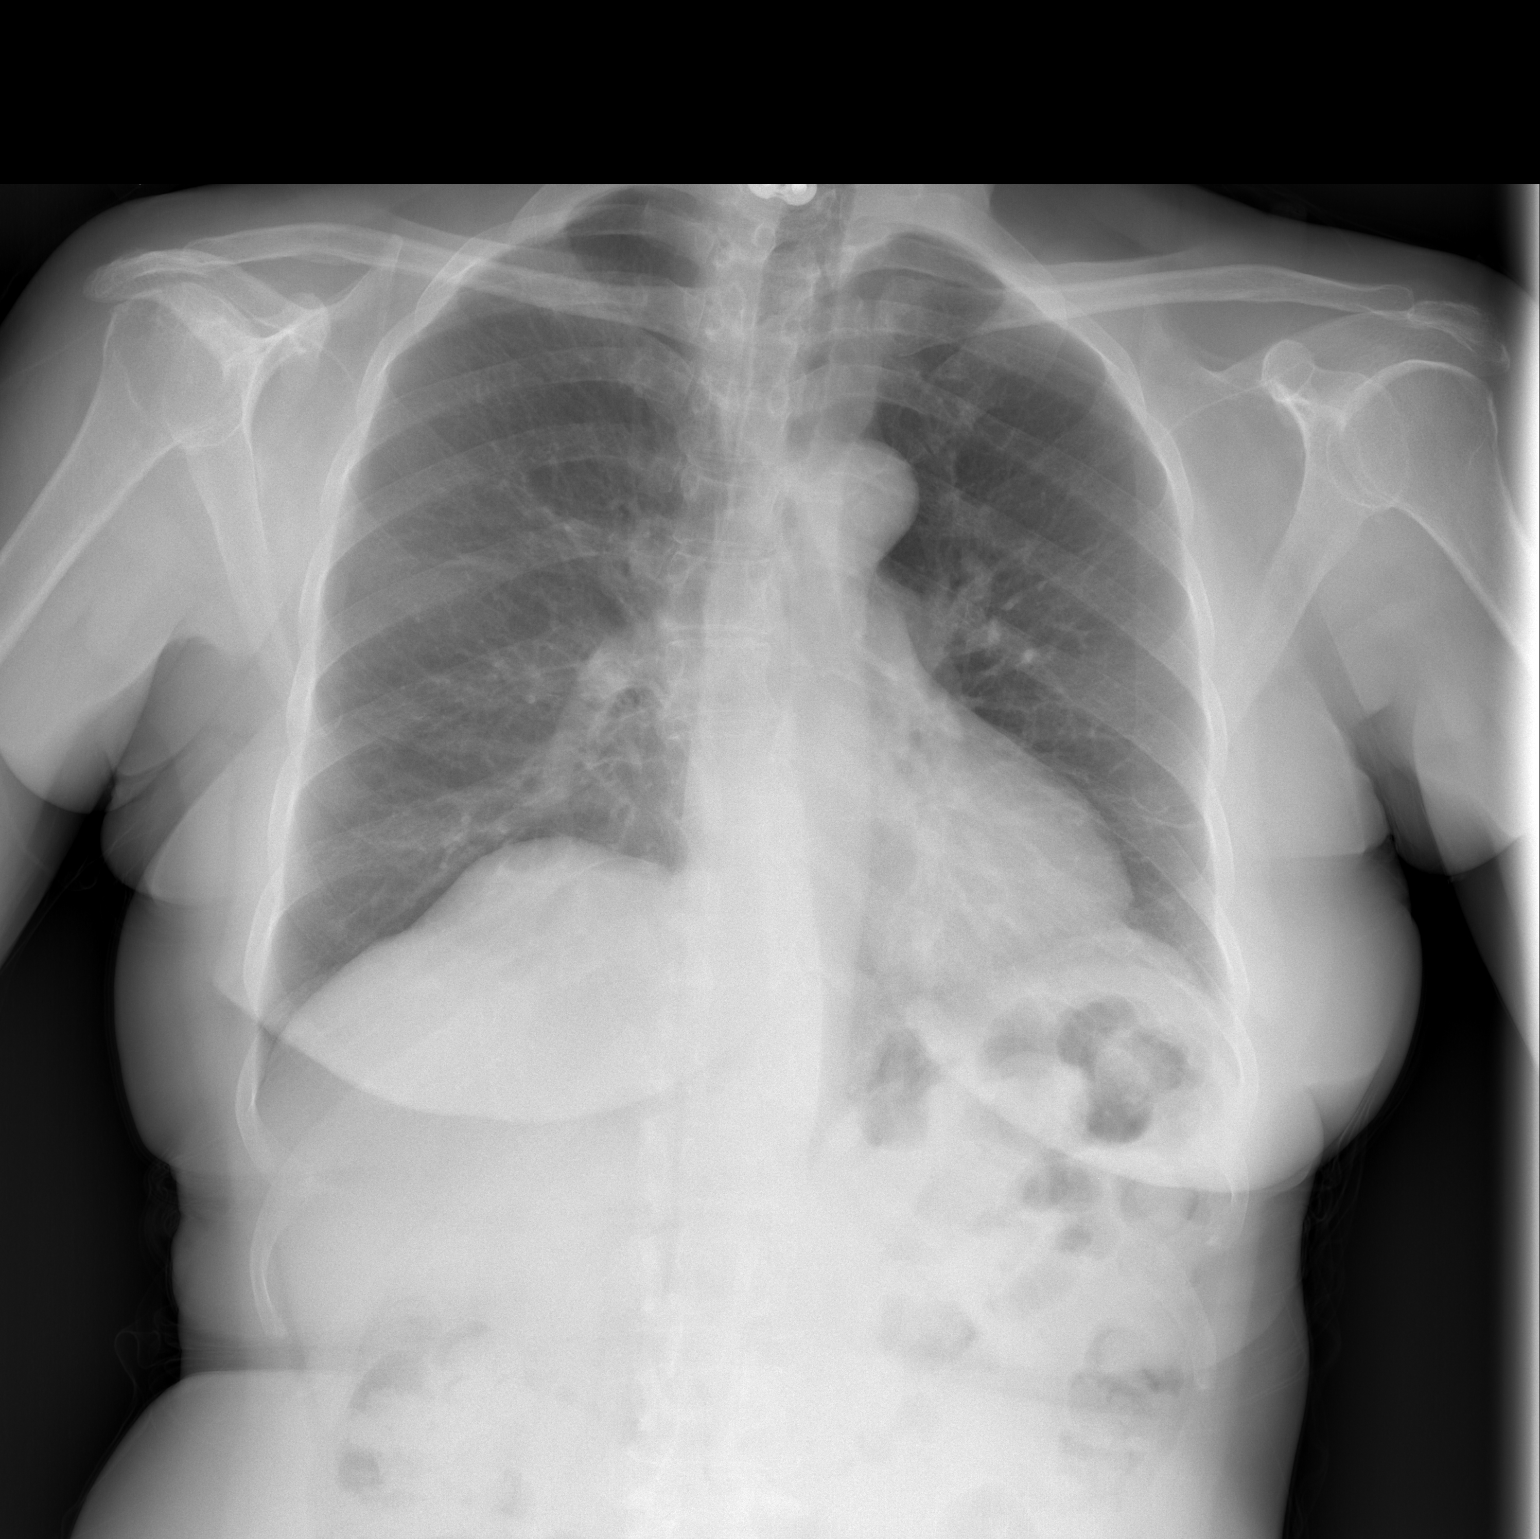

[w chest lat]
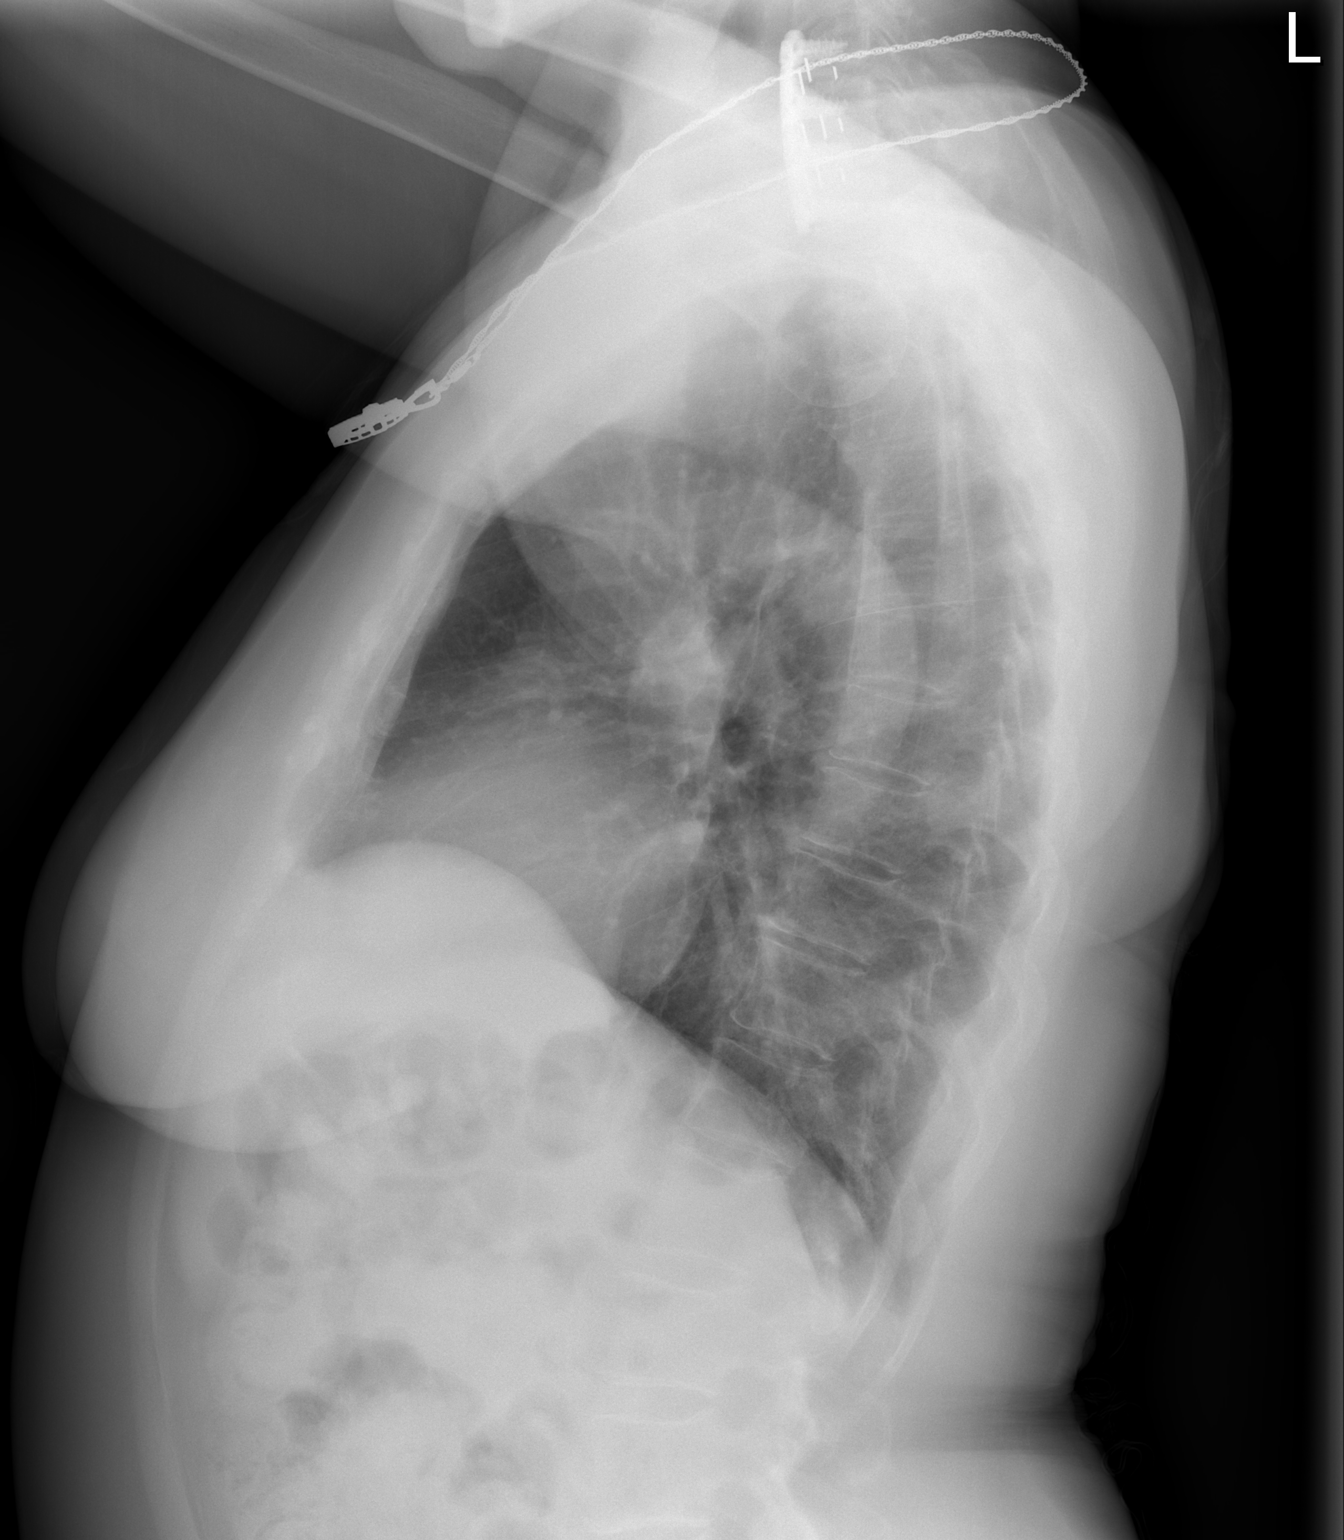

[2 of 2 positions shown; findings below may reference images not displayed]

FINDINGS: The heart size and mediastinal contours are within normal limits.
Both lungs are clear. Previous anterior plate and screw fixation of
the cervical spine.
IMPRESSION: 1. No acute findings.  No active cardiopulmonary abnormalities.

## 2018-03-29 NOTE — Telephone Encounter (Signed)
Letter started and routed to Dr. Diona Browner to proof read and add any additional information to.

## 2018-04-03 IMAGING — DX DG HIP (WITH OR WITHOUT PELVIS) 1V PORT*L*
1 series · 1 of 1 positions shown · non-contrast
Comparison: None.

CLINICAL DATA: Status post left hip replacement today.

EXAM:
DG HIP (WITH OR WITHOUT PELVIS) 1V PORT LEFT

[hip lat]
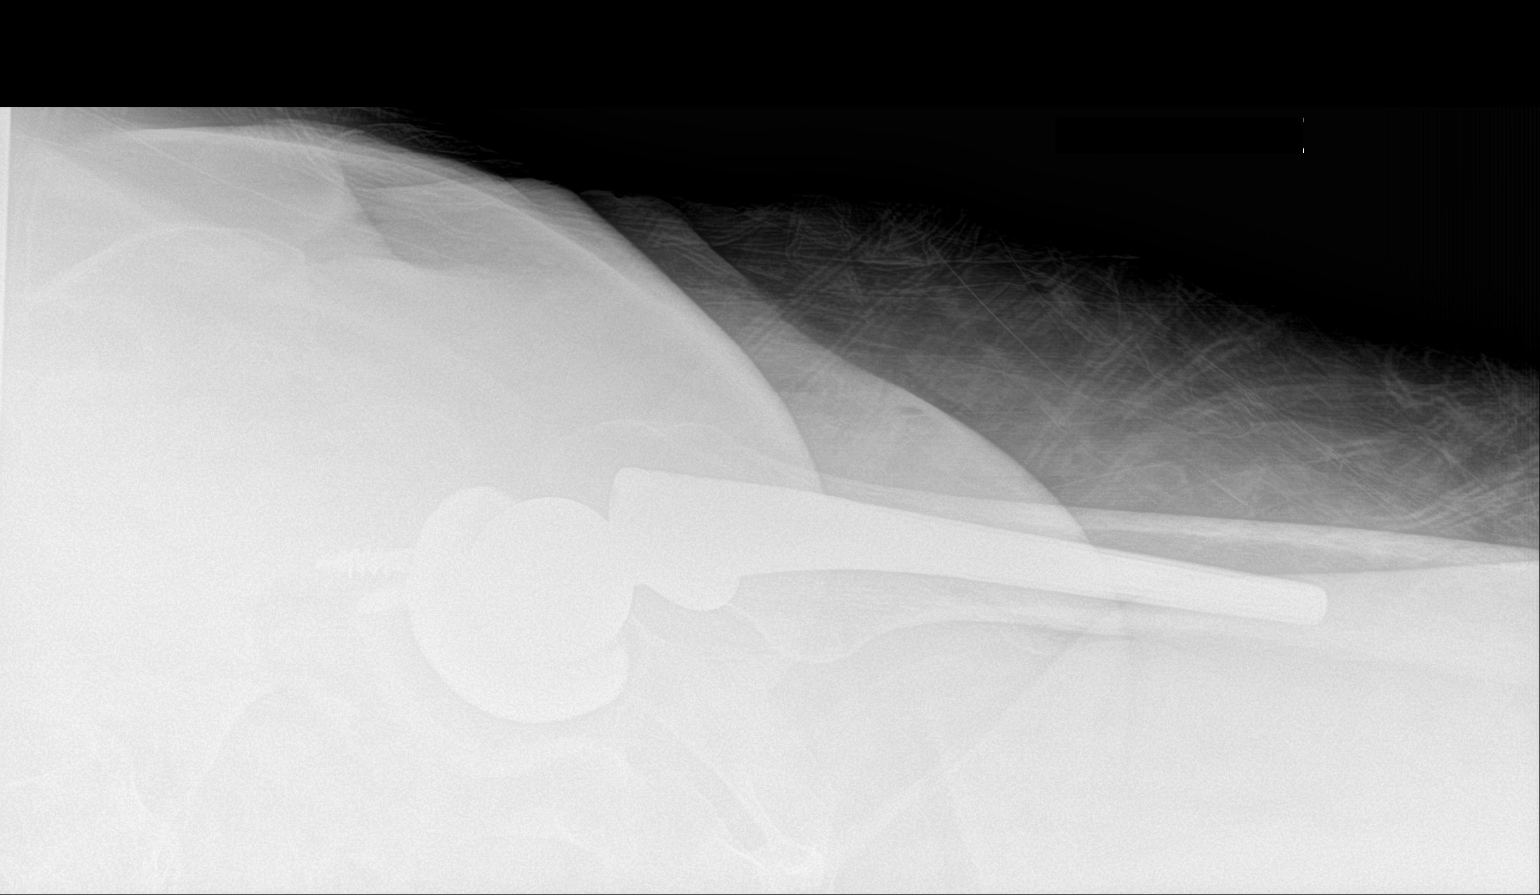

[1 of 1 positions shown; findings below may reference images not displayed]

FINDINGS: Total left hip replacement is identified. The device is located. No
fracture.
IMPRESSION: Status post left total hip replacement.  No acute finding.

## 2018-04-10 ENCOUNTER — Encounter: Payer: Self-pay | Admitting: Family Medicine

## 2018-04-10 DIAGNOSIS — R195 Other fecal abnormalities: Secondary | ICD-10-CM

## 2018-04-15 DIAGNOSIS — M1711 Unilateral primary osteoarthritis, right knee: Secondary | ICD-10-CM | POA: Diagnosis not present

## 2018-04-20 ENCOUNTER — Encounter: Payer: Self-pay | Admitting: Gastroenterology

## 2018-04-22 DIAGNOSIS — M1711 Unilateral primary osteoarthritis, right knee: Secondary | ICD-10-CM | POA: Diagnosis not present

## 2018-04-29 DIAGNOSIS — M1711 Unilateral primary osteoarthritis, right knee: Secondary | ICD-10-CM | POA: Diagnosis not present

## 2018-05-18 ENCOUNTER — Other Ambulatory Visit: Payer: Self-pay | Admitting: *Deleted

## 2018-05-18 MED ORDER — DULOXETINE HCL 60 MG PO CPEP
ORAL_CAPSULE | ORAL | 0 refills | Status: DC
Start: 2018-05-18 — End: 2018-08-16

## 2018-06-03 ENCOUNTER — Other Ambulatory Visit: Payer: Self-pay

## 2018-06-03 MED ORDER — MELOXICAM 15 MG PO TABS: 15.0000 mg | ORAL_TABLET | Freq: Every day | ORAL | 0 refills | Status: DC

## 2018-06-03 NOTE — Telephone Encounter (Signed)
Last office visit 03/08/2018 with Dr. Damita Dunnings for hives. CPE scheduled for 06/22/2018.   Last refilled 01/21/2018 for #90 with no refills.  Ok to refill?

## 2018-06-15 DIAGNOSIS — M1711 Unilateral primary osteoarthritis, right knee: Secondary | ICD-10-CM | POA: Diagnosis not present

## 2018-06-16 ENCOUNTER — Encounter: Payer: Self-pay | Admitting: Gastroenterology

## 2018-06-16 ENCOUNTER — Ambulatory Visit: Payer: Medicare Other | Admitting: Gastroenterology

## 2018-06-16 VITALS — BP 116/78 | HR 72 | Ht 61.5 in | Wt 143.0 lb

## 2018-06-16 DIAGNOSIS — R195 Other fecal abnormalities: Secondary | ICD-10-CM

## 2018-06-16 DIAGNOSIS — Z791 Long term (current) use of non-steroidal anti-inflammatories (NSAID): Secondary | ICD-10-CM | POA: Diagnosis not present

## 2018-06-16 DIAGNOSIS — K5900 Constipation, unspecified: Secondary | ICD-10-CM | POA: Diagnosis not present

## 2018-06-16 MED ORDER — NA SULFATE-K SULFATE-MG SULF 17.5-3.13-1.6 GM/177ML PO SOLN: 1.0000 | Freq: Once | ORAL | 0 refills | Status: AC

## 2018-06-16 NOTE — Patient Instructions (Addendum)
You have been scheduled for a colonoscopy. Please follow written instructions given to you at your visit today.  Please pick up your prep supplies at the pharmacy within the next 1-3 days. If you use inhalers (even only as needed), please bring them with you on the day of your procedure. Your physician has requested that you go to www.startemmi.com and enter the access code given to you at your visit today. This web site gives a general overview about your procedure. However, you should still follow specific instructions given to you by our office regarding your preparation for the procedure.  AVOID NSAIDS     Constipation, Adult Constipation is when a person:  Poops (has a bowel movement) fewer times in a week than normal.  Has a hard time pooping.  Has poop that is dry, hard, or bigger than normal.  Follow these instructions at home: Eating and drinking   Eat foods that have a lot of fiber, such as: ? Fresh fruits and vegetables. ? Whole grains. ? Beans.  Eat less of foods that are high in fat, low in fiber, or overly processed, such as: ? [place] fries. ? Hamburgers. ? Cookies. ? Candy. ? Soda.  Drink enough fluid to keep your pee (urine) clear or pale yellow. General instructions  Exercise regularly or as told by your doctor.  Go to the restroom when you feel like you need to poop. Do not hold it in.  Take over-the-counter and prescription medicines only as told by your doctor. These include any fiber supplements.  Do pelvic floor retraining exercises, such as: ? Doing deep breathing while relaxing your lower belly (abdomen). ? Relaxing your pelvic floor while pooping.  Watch your condition for any changes.  Keep all follow-up visits as told by your doctor. This is important. Contact a doctor if:  You have pain that gets worse.  You have a fever.  You have not pooped for 4 days.  You throw up (vomit).  You are not hungry.  You lose weight.  You are  bleeding from the anus.  You have thin, pencil-like poop (stool). Get help right away if:  You have a fever, and your symptoms suddenly get worse.  You leak poop or have blood in your poop.  Your belly feels hard or bigger than normal (is bloated).  You have very bad belly pain.  You feel dizzy or you faint. This information is not intended to replace advice given to you by your health care provider. Make sure you discuss any questions you have with your health care provider. Document Released: 03/03/2008 Document Revised: 04/04/2016 Document Reviewed: 03/05/2016 Elsevier Interactive Patient Education  [employer].   If you are age 64 or older, your body mass index should be between 23-30. Your Body mass index is 26.58 kg/m. If this is out of the aforementioned range listed, please consider follow up with your Primary Care Provider.  If you are age 64 or younger, your body mass index should be between 19-25. Your Body mass index is 26.58 kg/m. If this is out of the aformentioned range listed, please consider follow up with your Primary Care Provider.    Thank you for choosing [facility]  Karleen Hampshire Nandigam,MD

## 2018-06-16 NOTE — Progress Notes (Signed)
Lisa Odom    [id number]    [birth year]  Primary Care Physician:Bedsole, Mervyn Gay, MD  Referring Physician: [name], MD [place], [place] [address]  Chief complaint:  Nausea, constipation, heme positive stool  HPI: 62 year old African-American female with history of chronic depression, fibromyalgia and arthritis here for evaluation of fecal Hemoccult positive test. Patient was previously followed by Dr. Olevia Perches and last EGD and colonoscopy was in 2014, prior to that in 2008 and 2005 as listed below. She has struggled with constipation on and off.  She currently has one bowel movement daily but her normal baseline is up to 3 bowel movements daily.  She has not been eating as much as before as she lays in bed longer on most days and skips meals.  Denies any recent worsening of depression.  She is on Wellbutrin and Cymbalta, feels her symptoms are stable Fecal Hemoccult test positive June 2019.  Denies any bright red blood per rectum but she has on and off dark stool.  Denies weight loss, vomiting, dysphagia or diarrhea. She was taking meloxicam daily and takes 1 to 2 capsules of Excedrin about once a week on average.  She was also on aspirin 325 mg daily for a month after orthopedic surgery in February 2019.  She stopped taking meloxicam last week. Hemoglobin 9.1 with low MCV 27.2 suggestive of microcytic anemia and iron deficiency  Outpatient Encounter Medications as of 06/16/2018  Medication Sig  . ALPRAZolam (XANAX) 0.5 MG tablet Take 0.5 mg by mouth 4 (four) times daily as needed for anxiety.  Marland Kitchen amphetamine-dextroamphetamine (ADDERALL) 30 MG tablet Take 30 mg by mouth 2 (two) times daily. In the morning & early afternoon  . aspirin EC 325 MG tablet Take 1 tablet (325 mg total) by mouth 2 (two) times daily after a meal. Take x 1 month post op to decrease risk of blood clots.  Marland Kitchen aspirin-acetaminophen-caffeine (EXCEDRIN MIGRAINE) 250-250-65 MG tablet  Take 2 tablets by mouth 2 (two) times daily as needed for headache.  Marland Kitchen atorvastatin (LIPITOR) 40 MG tablet Take 1 tablet (40 mg total) by mouth daily.  Marland Kitchen b complex vitamins capsule Take 1 capsule by mouth every 3 (three) days.   . Black Pepper-Turmeric (TURMERIC COMPLEX/BLACK PEPPER PO) Take 1 capsule by mouth daily.   . Blood Glucose Monitoring Suppl (ACCU-CHEK NANO SMARTVIEW) w/Device KIT Use to check blood sugar three times a day.  Dx: E11.40  . buPROPion (WELLBUTRIN XL) 150 MG 24 hr tablet Take 150 mg by mouth daily.  Marland Kitchen CALCIUM-VITAMIN D PO Take 1-2 tablets by mouth 4 (four) times daily. 1 in the morning, 2 at lunch, 2 in the later afternoon, and 1 tablet at bedtime.  . DULoxetine (CYMBALTA) 60 MG capsule TAKE 2 CAPSULES (120 MG TOTAL) BY MOUTH DAILY.  . fexofenadine (ALLEGRA ALLERGY) 180 MG tablet Take 1 tablet (180 mg total) by mouth daily.  Marland Kitchen gabapentin (NEURONTIN) 600 MG tablet Take 0.5-2 tablets (300-1,200 mg total) by mouth 3 (three) times daily. Take 0.5 tablet (300 mg) by mouth in the morning & early afternoon, then 2 tablets (1200 mg) by mouth at bedtime.  Marland Kitchen glucose blood (ACCU-CHEK SMARTVIEW) test strip Use to check blood sugar three times a day.  Dx: E11.40  . Lancets (ACCU-CHEK SOFT TOUCH) lancets Use to check blood sugar three times a day.  Dx: E11.40  . Multiple Vitamin (MULTIVITAMIN) tablet Take 1 tablet by  mouth daily.  . nabumetone (RELAFEN) 750 MG tablet Take 1 tablet by mouth daily.  . Omega-3 Fatty Acids (FISH OIL PO) Take 1 capsule by mouth daily.  . pantoprazole (PROTONIX) 40 MG tablet Take 1 tablet (40 mg total) by mouth daily.  Marland Kitchen tiZANidine (ZANAFLEX) 4 MG tablet Take 0.5 tablets (2 mg total) by mouth every 8 (eight) hours as needed for muscle spasms.  . Na Sulfate-K Sulfate-Mg Sulf (SUPREP BOWEL PREP KIT) 17.5-3.13-1.6 GM/177ML SOLN Take 1 kit by mouth once for 1 dose.  . [DISCONTINUED] meloxicam (MOBIC) 15 MG tablet Take 1 tablet (15 mg total) by mouth at bedtime.  .  [DISCONTINUED] predniSONE (DELTASONE) 20 MG tablet Take 2 a day for 5 days, then 1 a day for 5 days, with food. Don't take with aleve/ibuprofen/meloxicam.   No facility-administered encounter medications on file as of 06/16/2018.     Allergies as of 06/16/2018 - Review Complete 06/16/2018  Allergen Reaction Noted  . Hydrochlorothiazide Rash 08/04/2012  . Sulfonamide derivatives Rash     Past Medical History:  Diagnosis Date  . Anxiety   . Contact lens/glasses fitting    wears contacts or glasses  . Depression   . Diabetes mellitus without complication (HCC)    No meds  . Diverticulosis   . Family history of adverse reaction to anesthesia    pts sister has severe N/V  . Fibromyalgia   . Gastritis   . GERD (gastroesophageal reflux disease)   . Hemorrhoid   . Hiatal hernia    neuropathy  . Migraine   . Neuropathy   . Panic attacks   . Pneumonia    as an infant    Past Surgical History:  Procedure Laterality Date  . ANTERIOR CERVICAL DECOMP/DISCECTOMY FUSION N/A 09/12/2014   Procedure: Evacuation of cervical hematoma;  Surgeon: [name], MD;  Location: [facility];  Service: Neurosurgery;  Laterality: N/A;  . ANTERIOR CERVICAL DECOMP/DISCECTOMY FUSION N/A 09/12/2014   Procedure: CERVICAL FOUR-FIVE, CERVICAL FIVE-SIX, CERVICAL SIX-SEVEN ANTERIOR CERVICAL DECOMPRESSION/DISCECTOMY FUSION;  Surgeon: [name], MD;  Location: [facility];  Service: Neurosurgery;  Laterality: N/A;  . BUNIONECTOMY    . CARPAL TUNNEL RELEASE  2002   right  . COLONOSCOPY    . JOINT REPLACEMENT    . SHOULDER ARTHROSCOPY WITH ROTATOR CUFF REPAIR  2010   right  . TOTAL HIP ARTHROPLASTY Left 11/20/2017   Procedure: LEFT TOTAL HIP ARTHROPLASTY ANTERIOR APPROACH WITH BONE GRAFTING AND ACETABULAR SCREWS;  Surgeon: Dorna Leitz, MD;  Location: [facility];  Service: Orthopedics;  Laterality: Left;  . TRIGGER FINGER RELEASE Right 03/23/2013   Procedure: RELEASE TRIGGER FINGER/A-1 PULLEY RIGHT RING  FINGER;  Surgeon: [name], MD;  Location: [facility];  Service: Orthopedics;  Laterality: Right;  . TUBAL LIGATION    . UPPER GI ENDOSCOPY      Family History  Problem Relation Age of Onset  . Diabetes Sister   . Anxiety disorder Sister   . Heart disease Brother   . Ovarian cancer Cousin   . Breast cancer Maternal Grandmother        grandmother  . Diabetes Maternal Aunt        aunt  . Alcohol abuse Maternal Uncle        uncle  . Diabetes Paternal Aunt   . Alcohol abuse Paternal Uncle     Social History   Socioeconomic History  . Marital status: Divorced    Spouse name: Not  on file  . Number of children: 1  . Years of education: Not on file  . Highest education level: Not on file  Occupational History    Employer: [employer]  . Financial resource strain: Not on file  . Food insecurity:    Worry: Not on file    Inability: Not on file  . Transportation needs:    Medical: Not on file    Non-medical: Not on file  Tobacco Use  . Smoking status: Never Smoker  . Smokeless tobacco: Never Used  Substance and Sexual Activity  . Alcohol use: No    Alcohol/week: 0.0 standard drinks  . Drug use: No  . Sexual activity: Not on file  Lifestyle  . Physical activity:    Days per week: Not on file    Minutes per session: Not on file  . Stress: Not on file  Relationships  . Social connections:    Talks on phone: Not on file    Gets together: Not on file    Attends religious service: Not on file    Active member of club or organization: Not on file    Attends meetings of clubs or organizations: Not on file    Relationship status: Not on file  . Intimate partner violence:    Fear of current or ex partner: Not on file    Emotionally abused: Not on file    Physically abused: Not on file    Forced sexual activity: Not on file  Other Topics Concern  . Not on file  Social History Narrative   Lives alone in a one story home.  Has one child.      Currently not working.     Worked for [employer].        Review of systems: Review of Systems  Constitutional: Negative for fever and chills.  Positive for fatigue HENT: Positive for sinus trouble Eyes: Negative for blurred vision.  Respiratory: Negative for cough, shortness of breath and wheezing.   Cardiovascular: Negative for chest pain and palpitations.  Gastrointestinal: as per HPI Genitourinary: Negative for dysuria, urgency, frequency and hematuria.  Musculoskeletal: Positive for myalgias, back pain and joint pain.  Skin: Negative for itching and rash.  Neurological: Negative for dizziness, tremors, focal weakness, seizures and loss of consciousness.  Positive for frequent headaches and insomnia Endo/Heme/Allergies: Negative Psychiatric/Behavioral: Negative for  suicidal ideas and hallucinations.  Positive for anxiety and depression All other systems reviewed and are negative.   Physical Exam: Vitals:   06/16/18 1425  BP: 116/78  Pulse: 72   Body mass index is 26.58 kg/m. Gen:      No acute distress HEENT:  EOMI, sclera anicteric Neck:     No masses; no thyromegaly Lungs:    Clear to auscultation bilaterally; normal respiratory effort CV:         Regular rate and rhythm; no murmurs Abd:      + bowel sounds; soft, non-tender; no palpable masses, no distension Ext:    No edema; adequate peripheral perfusion Skin:      Warm and dry; no rash Neuro: alert and oriented x 3 Psych: normal mood and affect  Data Reviewed:  Reviewed labs, radiology imaging, old records and pertinent past GI work up   Assessment and Plan/Recommendations:  51 yr F with history of chronic nausea, IBS here with complaints of constipation and heme positive stool H/o frequent NSAID use Advised patient to avoid NSAID's Increase dietary fiber and fluid  to improve constipation, if continues to have persistent symptoms will consider adding laxatives Schedule for EGD and colonoscopy for evaluation  of heme positive stool The risks and benefits as well as alternatives of endoscopic procedure(s) have been discussed and reviewed. All questions answered. The patient agrees to proceed.    Damaris Hippo , MD [phone]    CC: [name], MD

## 2018-06-22 ENCOUNTER — Ambulatory Visit (INDEPENDENT_AMBULATORY_CARE_PROVIDER_SITE_OTHER): Payer: Medicare Other | Admitting: Family Medicine

## 2018-06-22 ENCOUNTER — Encounter: Payer: Self-pay | Admitting: Family Medicine

## 2018-06-22 VITALS — BP 131/88 | HR 89 | Temp 97.8°F | Ht 61.5 in | Wt 138.8 lb

## 2018-06-22 DIAGNOSIS — E1140 Type 2 diabetes mellitus with diabetic neuropathy, unspecified: Secondary | ICD-10-CM | POA: Diagnosis not present

## 2018-06-22 DIAGNOSIS — E2839 Other primary ovarian failure: Secondary | ICD-10-CM

## 2018-06-22 DIAGNOSIS — I10 Essential (primary) hypertension: Secondary | ICD-10-CM | POA: Diagnosis not present

## 2018-06-22 DIAGNOSIS — Z23 Encounter for immunization: Secondary | ICD-10-CM

## 2018-06-22 DIAGNOSIS — Z0000 Encounter for general adult medical examination without abnormal findings: Secondary | ICD-10-CM | POA: Diagnosis not present

## 2018-06-22 DIAGNOSIS — E7800 Pure hypercholesterolemia, unspecified: Secondary | ICD-10-CM

## 2018-06-22 DIAGNOSIS — F332 Major depressive disorder, recurrent severe without psychotic features: Secondary | ICD-10-CM

## 2018-06-22 DIAGNOSIS — Z1231 Encounter for screening mammogram for malignant neoplasm of breast: Secondary | ICD-10-CM

## 2018-06-22 LAB — POCT GLYCOSYLATED HEMOGLOBIN (HGB A1C): HEMOGLOBIN A1C: 5.9 % — AB (ref 4.0–5.6)

## 2018-06-22 LAB — HM DIABETES FOOT EXAM

## 2018-06-22 NOTE — Patient Instructions (Addendum)
Follow BP at home.. Call in few day with BP measurement.. To assure BP improved.  Stop at front desk to set up lab appt for tommorow. Please stop at the front desk to set up referral.

## 2018-06-22 NOTE — Progress Notes (Signed)
Subjective:    Patient ID: Lisa Odom, female    DOB: April 14, 1956, 62 y.o.   MRN: [id number]  HPI   The patient presents for annual medicare wellness, complete physical and review of chronic health problems.  I have personally reviewed the Medicare Annual Wellness questionnaire and have noted 1. The patient's medical and social history 2. Their use of alcohol, tobacco or illicit drugs 3. Their current medications and supplements 4. The patient's functional ability including ADL's, fall risks, home safety risks and hearing or visual             impairment. 5. Diet and physical activities 6. Evidence for depression or mood disorders 7.         Updated provider list Cognitive evaluation was performed and recorded on pt medicare questionnaire form. The patients weight, height, BMI and visual acuity have been recorded in the chart  I have made referrals, counseling and provided education to the patient based review of the above and I have provided the pt with a written personalized care plan for preventive services.   Documentation of this information was scanned into the electronic record under the media tab.  06/22/18  Diabetes:  Very well controlled.   Lab Results  Component Value Date   HGBA1C 5.9 (A) 06/22/2018  Using medications without difficulties: Hypoglycemic episodes: Hyperglycemic episodes: Feet problems:no ulcers Blood Sugars averaging: FBS 90117 eye exam within last year: 01/2018  Body mass index is 25.79 kg/m. Wt Readings from Last 3 Encounters:  06/22/18 138 lb 12 oz (62.9 kg)  06/16/18 143 lb (64.9 kg)  03/08/18 140 lb 8 oz (63.7 kg)     Elevated Cholesterol:  Due for re-eval. On atorvastatin. Lab Results  Component Value Date   CHOL 204 (H) 04/28/2017   HDL 53.00 04/28/2017   LDLCALC 137 (H) 04/28/2017   TRIG 69.0 04/28/2017   CHOLHDL 4 04/28/2017  Using medications without problems: Muscle aches:  Diet compliance: moderate Exercise:  walking .Marland Kitchen Having some right knee pain (treated by ortho) riding bike at [facility]. Other complaints:  Hypertension:   Uncontrolled in office today. At home it has been running wel and nml at GI MD She did take Adderall right before coming.. Usually takes in AM. Also rushed to office.  BP Readings from Last 3 Encounters:  06/22/18 (!) 150/90  06/16/18 116/78  03/08/18 122/76  Using medication without problems or lightheadedness: none Chest pain with exertion:none Edema:none Short of breath:none Average home BPs: 115/70 Other issues:    Advance directives and end of life planning reviewed in detail with patient and documented in EMR. Patient given handout on advance care directives if needed. HCPOA and living will updated if needed. Fall Risk  06/22/2018 06/16/2016 05/20/2016  Falls in the past year? No Yes Yes  Number falls in past yr: - 2 or more 2 or more  Injury with Fall? - No No  Risk for fall due to : - Impaired balance/gait Impaired mobility  Follow up - Falls evaluation completed;Education provided;Falls prevention discussed Falls evaluation completed;Education provided;Falls prevention discussed    Hearing Screening   Method: Audiometry   125Hz  250Hz  500Hz  1000Hz  2000Hz  3000Hz  4000Hz  6000Hz  8000Hz   Right ear:   20 20 20  20     Left ear:   25 20 20  20     Vision Screening Comments: Eye Exam 02/04/2018 at [facility]  Depression screen [facility] 2/9 04/28/2017 08/13/2015  Decreased Interest 1 3  Down, Depressed, Hopeless 1 3  PHQ - 2 Score 2 6  Altered sleeping - 2  Tired, decreased energy - 3  Change in appetite - 2  Feeling bad or failure about yourself  - 3  Trouble concentrating - 3  Moving slowly or fidgety/restless - 3  Suicidal thoughts - 2  PHQ-9 Score - 24  Difficult doing work/chores - Very difficult  PHQ9 today 11  Blood pressure (!) 150/90, pulse (!) 111, temperature 97.8 F (36.6 C), temperature source Oral, height 5' 1.5" (1.562 m), weight 138 lb 12 oz (62.9  kg). Social History /Family History/Past Medical History reviewed in detail and updated in EMR if needed.   Review of Systems  Constitutional: Negative for fatigue and fever.  HENT: Negative for congestion.   Eyes: Negative for pain.  Respiratory: Negative for cough and shortness of breath.   Cardiovascular: Negative for chest pain, palpitations and leg swelling.  Gastrointestinal: Negative for abdominal pain.  Genitourinary: Negative for dysuria and vaginal bleeding.  Musculoskeletal: Positive for myalgias, neck pain and neck stiffness. Negative for back pain.  Neurological: Positive for headaches. Negative for syncope and light-headedness.  Psychiatric/Behavioral: Positive for dysphoric mood and sleep disturbance.       Objective:   Physical Exam  Constitutional: Vital signs are normal. She appears well-developed and well-nourished. She is cooperative.  Non-toxic appearance. She does not appear ill. No distress.  HENT:  Head: Normocephalic.  Right Ear: Hearing, tympanic membrane, external ear and ear canal normal. Tympanic membrane is not erythematous, not retracted and not bulging.  Left Ear: Hearing, tympanic membrane, external ear and ear canal normal. Tympanic membrane is not erythematous, not retracted and not bulging.  Nose: Nose normal. No mucosal edema or rhinorrhea. Right sinus exhibits no maxillary sinus tenderness and no frontal sinus tenderness. Left sinus exhibits no maxillary sinus tenderness and no frontal sinus tenderness.  Mouth/Throat: Uvula is midline, oropharynx is clear and moist and mucous membranes are normal.  Eyes: Pupils are equal, round, and reactive to light. Conjunctivae, EOM and lids are normal. Lids are everted and swept, no foreign bodies found.  Neck: Trachea normal and normal range of motion. Neck supple. Carotid bruit is not present. No thyroid mass and no thyromegaly present.  Cardiovascular: Normal rate, regular rhythm, S1 normal, S2 normal, normal  heart sounds, intact distal pulses and normal pulses. Exam reveals no gallop and no friction rub.  No murmur heard. Pulmonary/Chest: Effort normal and breath sounds normal. No tachypnea. No respiratory distress. She has no decreased breath sounds. She has no wheezes. She has no rhonchi. She has no rales.  Abdominal: Soft. Normal appearance and bowel sounds are normal. She exhibits no distension, no fluid wave, no abdominal bruit and no mass. There is no hepatosplenomegaly. There is no tenderness. There is no rebound, no guarding and no CVA tenderness. No hernia.  Lymphadenopathy:    She has no cervical adenopathy.    She has no axillary adenopathy.  Neurological: She is alert. She has normal strength. No cranial nerve deficit or sensory deficit. Coordination and gait abnormal.  Skin: Skin is warm, dry and intact. No rash noted.  Psychiatric: Her speech is normal and behavior is normal. Judgment and thought content normal. Her mood appears not anxious. Her affect is labile. Cognition and memory are normal. She does not exhibit a depressed mood.  Histrionic  inappropriate laughing          Assessment & Plan:  The patient's preventative maintenance and recommended screening tests for an annual wellness  exam were reviewed in full today. Brought up to date unless services declined.  Counselled on the importance of diet, exercise, and its role in overall health and mortality. The patient's FH and SH was reviewed, including their home life, tobacco status, and drug and alcohol status.    Vaccine: given flu  PAP 2016 nml pap, neg HPV plan repeat in 5 years. Mammogram every 1-2 years. COLON: upcoming  hep C done  DEXA due.

## 2018-06-23 ENCOUNTER — Other Ambulatory Visit: Payer: Medicare Other

## 2018-06-23 NOTE — Assessment & Plan Note (Addendum)
Good control with diet.

## 2018-06-23 NOTE — Assessment & Plan Note (Signed)
Elevated in office today.. But well controlled at home and at recent [place]. Took adderall right before rushing to office. ON recheck in office.. In nml range.  BP Readings from Last 3 Encounters:  06/22/18 131/88  06/16/18 116/78  03/08/18 122/76

## 2018-06-25 ENCOUNTER — Encounter: Payer: Self-pay | Admitting: Gastroenterology

## 2018-06-28 ENCOUNTER — Other Ambulatory Visit (INDEPENDENT_AMBULATORY_CARE_PROVIDER_SITE_OTHER): Payer: Medicare Other

## 2018-06-28 DIAGNOSIS — E1140 Type 2 diabetes mellitus with diabetic neuropathy, unspecified: Secondary | ICD-10-CM | POA: Diagnosis not present

## 2018-06-28 DIAGNOSIS — E7800 Pure hypercholesterolemia, unspecified: Secondary | ICD-10-CM

## 2018-06-28 LAB — LIPID PANEL
CHOLESTEROL: 127 mg/dL (ref 0–200)
HDL: 57.4 mg/dL (ref >39.00)
LDL CALC: 58 mg/dL (ref 0–99)
NonHDL: 69.27
TRIGLYCERIDES: 54 mg/dL (ref 0.0–149.0)
Total CHOL/HDL Ratio: 2
VLDL: 10.8 mg/dL (ref 0.0–40.0)

## 2018-06-28 LAB — COMPREHENSIVE METABOLIC PANEL
ALT: 22 U/L (ref 0–35)
AST: 21 U/L (ref 0–37)
Albumin: 4.5 g/dL (ref 3.5–5.2)
Alkaline Phosphatase: 101 U/L (ref 39–117)
BUN: 19 mg/dL (ref 6–23)
CALCIUM: 9.9 mg/dL (ref 8.4–10.5)
CHLORIDE: 102 meq/L (ref 96–112)
CO2: 29 mEq/L (ref 19–32)
Creatinine, Ser: 0.68 mg/dL (ref 0.40–1.20)
GFR: 111.82 mL/min (ref >60.00)
Glucose, Bld: 101 mg/dL — ABNORMAL HIGH (ref 70–99)
POTASSIUM: 3.9 meq/L (ref 3.5–5.1)
Sodium: 137 mEq/L (ref 135–145)
Total Bilirubin: 0.4 mg/dL (ref 0.2–1.2)
Total Protein: 7.4 g/dL (ref 6.0–8.3)

## 2018-06-28 LAB — MICROALBUMIN / CREATININE URINE RATIO
Creatinine,U: 113.9 mg/dL
Microalb Creat Ratio: 1.8 mg/g (ref 0.0–30.0)
Microalb, Ur: 2 mg/dL — ABNORMAL HIGH (ref 0.0–1.9)

## 2018-06-29 ENCOUNTER — Encounter: Payer: Self-pay | Admitting: Gastroenterology

## 2018-07-06 ENCOUNTER — Telehealth: Payer: Self-pay | Admitting: Gastroenterology

## 2018-07-06 NOTE — Telephone Encounter (Signed)
Dr Nandigam Please advise  

## 2018-07-06 NOTE — Telephone Encounter (Signed)
Do not recommend antibiotic prior to EGD and colonoscopy as per current guidelines .

## 2018-07-06 NOTE — Telephone Encounter (Signed)
Patient states since she has had a hip replacement her pcp wants her to take an antibiotic before any procedure. Patient wants to know if Dr.Nandigam can prescribe that antibiotic or if she needs to get it from her pcp possibly. Pt had an ov on 9.18.19.

## 2018-07-08 NOTE — Telephone Encounter (Signed)
Informed patient of recommendations per Dr Danielle Dess

## 2018-07-12 ENCOUNTER — Other Ambulatory Visit: Payer: Self-pay | Admitting: *Deleted

## 2018-07-12 DIAGNOSIS — G894 Chronic pain syndrome: Secondary | ICD-10-CM

## 2018-07-12 MED ORDER — ATORVASTATIN CALCIUM 40 MG PO TABS: 40.0000 mg | ORAL_TABLET | Freq: Every day | ORAL | 3 refills | Status: DC

## 2018-07-12 MED ORDER — PANTOPRAZOLE SODIUM 40 MG PO TBEC: 40.0000 mg | DELAYED_RELEASE_TABLET | Freq: Every day | ORAL | 3 refills | Status: DC

## 2018-07-13 ENCOUNTER — Encounter: Payer: Self-pay | Admitting: Gastroenterology

## 2018-07-13 ENCOUNTER — Ambulatory Visit (AMBULATORY_SURGERY_CENTER): Payer: Medicare Other | Admitting: Gastroenterology

## 2018-07-13 VITALS — BP 115/72 | HR 75 | Temp 96.9°F | Resp 15 | Ht 61.0 in | Wt 138.0 lb

## 2018-07-13 DIAGNOSIS — K2970 Gastritis, unspecified, without bleeding: Secondary | ICD-10-CM | POA: Diagnosis not present

## 2018-07-13 DIAGNOSIS — Z1211 Encounter for screening for malignant neoplasm of colon: Secondary | ICD-10-CM | POA: Diagnosis not present

## 2018-07-13 DIAGNOSIS — K5140 Inflammatory polyps of colon without complications: Secondary | ICD-10-CM | POA: Diagnosis not present

## 2018-07-13 DIAGNOSIS — K635 Polyp of colon: Secondary | ICD-10-CM

## 2018-07-13 DIAGNOSIS — K3189 Other diseases of stomach and duodenum: Secondary | ICD-10-CM | POA: Diagnosis not present

## 2018-07-13 DIAGNOSIS — K648 Other hemorrhoids: Secondary | ICD-10-CM | POA: Diagnosis not present

## 2018-07-13 DIAGNOSIS — D123 Benign neoplasm of transverse colon: Secondary | ICD-10-CM

## 2018-07-13 DIAGNOSIS — K5730 Diverticulosis of large intestine without perforation or abscess without bleeding: Secondary | ICD-10-CM | POA: Diagnosis not present

## 2018-07-13 DIAGNOSIS — R195 Other fecal abnormalities: Secondary | ICD-10-CM

## 2018-07-13 DIAGNOSIS — D127 Benign neoplasm of rectosigmoid junction: Secondary | ICD-10-CM

## 2018-07-13 DIAGNOSIS — K5900 Constipation, unspecified: Secondary | ICD-10-CM

## 2018-07-13 MED ORDER — SODIUM CHLORIDE 0.9 % IV SOLN: 500.0000 mL | Freq: Once | INTRAVENOUS | Status: DC

## 2018-07-13 NOTE — Progress Notes (Signed)
Pt's states no medical or surgical changes since previsit or office visit. 

## 2018-07-13 NOTE — Progress Notes (Signed)
To PACU, VSS. Report to [email or web address] 

## 2018-07-13 NOTE — Progress Notes (Signed)
Called to room to assist during endoscopic procedure.  Patient ID and intended procedure confirmed with present staff. Received instructions for my participation in the procedure from the performing physician.  

## 2018-07-13 NOTE — Op Note (Signed)
[place] Patient Name: Lisa Odom Procedure Date: 07/13/2018 2:46 PM MRN: [id number] Endoscopist: Mauri Pole , MD Age: 62 Referring MD:  Date of Birth: [birth year] Gender: Female Account #: [id number] Procedure:                Colonoscopy Indications:              Evaluation of unexplained GI bleeding (heme                            positive stool), Unexplained iron deficiency anemia Medicines:                Monitored Anesthesia Care Procedure:                Pre-Anesthesia Assessment:                           - Prior to the procedure, a History and Physical                            was performed, and patient medications and                            allergies were reviewed. The patient's tolerance of                            previous anesthesia was also reviewed. The risks                            and benefits of the procedure and the sedation                            options and risks were discussed with the patient.                            All questions were answered, and informed consent                            was obtained. Prior Anticoagulants: The patient has                            taken no previous anticoagulant or antiplatelet                            agents. ASA Grade Assessment: II - A patient with                            mild systemic disease. After reviewing the risks                            and benefits, the patient was deemed in                            satisfactory condition to undergo the procedure.  After obtaining informed consent, the colonoscope                            was passed under direct vision. Throughout the                            procedure, the patient's blood pressure, pulse, and                            oxygen saturations were monitored continuously. The                            Model PCF-H190DL [id number]) scope was introduced                            through  the anus and advanced to the the cecum,                            identified by appendiceal orifice and ileocecal                            valve. The colonoscopy was performed without                            difficulty. The patient tolerated the procedure                            well. The quality of the bowel preparation was                            excellent. The ileocecal valve, appendiceal                            orifice, and rectum were photographed. Scope In: 3:22:06 PM Scope Out: 3:36:13 PM Scope Withdrawal Time: 0 hours 9 minutes 55 seconds  Total Procedure Duration: 0 hours 14 minutes 7 seconds  Findings:                 The perianal and digital rectal examinations were                            normal.                           A 2 mm polyp was found in the transverse colon. The                            polyp was sessile. The polyp was removed with a                            cold biopsy forceps. Resection and retrieval were                            complete.  A 5 mm polyp was found in the rectum. The polyp was                            sessile. The polyp was removed with a cold snare.                            Resection and retrieval were complete.                           Scattered small and large-mouthed diverticula were                            found in the sigmoid colon, descending colon,                            ascending colon and cecum.                           Non-bleeding internal hemorrhoids were found during                            retroflexion. The hemorrhoids were small. Complications:            No immediate complications. Estimated Blood Loss:     Estimated blood loss was minimal. Impression:               - One 2 mm polyp in the transverse colon, removed                            with a cold biopsy forceps. Resected and retrieved.                           - One 5 mm polyp in the rectum, removed with a  cold                            snare. Resected and retrieved.                           - Diverticulosis in the sigmoid colon, in the                            descending colon, in the ascending colon and in the                            cecum.                           - Non-bleeding internal hemorrhoids. Recommendation:           - Patient has a contact number available for                            emergencies. The signs and symptoms of potential  delayed complications were discussed with the                            patient. Return to normal activities tomorrow.                            Written discharge instructions were provided to the                            patient.                           - Resume previous diet.                           - Continue present medications.                           - Await pathology results.                           - Repeat colonoscopy in 5-10 years for surveillance                            based on pathology results. Mauri Pole, MD 07/13/2018 3:45:33 PM This report has been signed electronically.

## 2018-07-13 NOTE — Op Note (Signed)
[place] Patient Name: Lisa Odom Procedure Date: 07/13/2018 2:46 PM MRN: [id number] Endoscopist: Mauri Pole , MD Age: 62 Referring MD:  Date of Birth: [birth year] Gender: Female Account #: [id number] Procedure:                Upper GI endoscopy Indications:              Suspected upper gastrointestinal bleeding in                            patient with unexplained iron deficiency anemia Medicines:                Monitored Anesthesia Care Procedure:                Pre-Anesthesia Assessment:                           - Prior to the procedure, a History and Physical                            was performed, and patient medications and                            allergies were reviewed. The patient's tolerance of                            previous anesthesia was also reviewed. The risks                            and benefits of the procedure and the sedation                            options and risks were discussed with the patient.                            All questions were answered, and informed consent                            was obtained. Prior Anticoagulants: The patient has                            taken no previous anticoagulant or antiplatelet                            agents. ASA Grade Assessment: II - A patient with                            mild systemic disease. After reviewing the risks                            and benefits, the patient was deemed in                            satisfactory condition to undergo the procedure.  After obtaining informed consent, the endoscope was                            passed under direct vision. Throughout the                            procedure, the patient's blood pressure, pulse, and                            oxygen saturations were monitored continuously. The                            Endoscope was introduced through the mouth, and                            advanced  to the second part of duodenum. The upper                            GI endoscopy was accomplished without difficulty.                            The patient tolerated the procedure well. Scope In: Scope Out: Findings:                 The Z-line was regular and was found 35 cm from the                            incisors.                           No gross lesions were noted in the entire esophagus.                           Patchy severe inflammation with hemorrhage                            characterized by congestion (edema), erosions,                            erythema, friability and shallow ulcerations was                            found in the gastric antrum. Biopsies were taken                            with a cold forceps for histology. Biopsies were                            taken with a cold forceps for Helicobacter pylori                            testing.                           Patchy mildly erythematous mucosa without active  bleeding and with no stigmata of bleeding was found                            in the duodenal bulb and in the second portion of                            the duodenum. Biopsies for histology were taken                            with a cold forceps for evaluation of celiac                            disease. Complications:            No immediate complications. Estimated Blood Loss:     Estimated blood loss was minimal. Impression:               - Z-line regular, 35 cm from the incisors.                           - No gross lesions in esophagus.                           - Gastritis with hemorrhage. Biopsied.                           - Erythematous duodenopathy. Biopsied. Recommendation:           - Patient has a contact number available for                            emergencies. The signs and symptoms of potential                            delayed complications were discussed with the                             patient. Return to normal activities tomorrow.                            Written discharge instructions were provided to the                            patient.                           - Resume previous diet.                           - Continue present medications.                           - No ibuprofen, naproxen, or other non-steroidal                            anti-inflammatory drugs.                           -  Await pathology results. Mauri Pole, MD 07/13/2018 3:40:53 PM This report has been signed electronically.

## 2018-07-13 NOTE — Patient Instructions (Signed)
YOU HAD AN ENDOSCOPIC PROCEDURE TODAY AT [facility]:   Refer to the procedure report that was given to you for any specific questions about what was found during the examination.  If the procedure report does not answer your questions, please call your gastroenterologist to clarify.  If you requested that your care partner not be given the details of your procedure findings, then the procedure report has been included in a sealed envelope for you to review at your convenience later.  YOU SHOULD EXPECT: Some feelings of bloating in the abdomen. Passage of more gas than usual.  Walking can help get rid of the air that was put into your GI tract during the procedure and reduce the bloating. If you had a lower endoscopy (such as a colonoscopy or flexible sigmoidoscopy) you may notice spotting of blood in your stool or on the toilet paper. If you underwent a bowel prep for your procedure, you may not have a normal bowel movement for a few days.  Please Note:  You might notice some irritation and congestion in your nose or some drainage.  This is from the oxygen used during your procedure.  There is no need for concern and it should clear up in a day or so.  SYMPTOMS TO REPORT IMMEDIATELY:   Following lower endoscopy (colonoscopy or flexible sigmoidoscopy):  Excessive amounts of blood in the stool  Significant tenderness or worsening of abdominal pains  Swelling of the abdomen that is new, acute  Fever of 100F or higher   Following upper endoscopy (EGD)  Vomiting of blood or coffee ground material  New chest pain or pain under the shoulder blades  Painful or persistently difficult swallowing  New shortness of breath  Fever of 100F or higher  Black, tarry-looking stools  For urgent or emergent issues, a gastroenterologist can be reached at any hour by calling [phone].  NO NSAIDS: ASPIRIN, ALEVE, IBUPROFEN UNTIL YOU GET OUR RESULTS. DIET:  We do recommend a small meal at  first, but then you may proceed to your regular diet.  Drink plenty of fluids but you should avoid alcoholic beverages for 24 hours. Try to increase the fiber I nyour diet, and drink plenty of water.  ACTIVITY:  You should plan to take it easy for the rest of today and you should NOT DRIVE or use heavy machinery until tomorrow (because of the sedation medicines used during the test).    FOLLOW UP: Our staff will call the number listed on your records the next business day following your procedure to check on you and address any questions or concerns that you may have regarding the information given to you following your procedure. If we do not reach you, we will leave a message.  However, if you are feeling well and you are not experiencing any problems, there is no need to return our call.  We will assume that you have returned to your regular daily activities without incident.  If any biopsies were taken you will be contacted by phone or by letter within the next 1-3 weeks.  Please call us at [phone] if you have not heard about the biopsies in 3 weeks.    SIGNATURES/CONFIDENTIALITY: You and/or your care partner have signed paperwork which will be entered into your electronic medical record.  These signatures attest to the fact that that the information above on your After Visit Summary has been reviewed and is understood.  Full responsibility of the confidentiality  of this discharge information lies with you and/or your care-partner.

## 2018-07-14 ENCOUNTER — Telehealth: Payer: Self-pay

## 2018-07-14 ENCOUNTER — Telehealth: Payer: Self-pay | Admitting: *Deleted

## 2018-07-14 NOTE — Telephone Encounter (Signed)
  Follow up Call-  Call back number 07/13/2018  Post procedure Call Back phone  # [phone]  Permission to leave phone message Yes  Some recent data might be hidden     Left message

## 2018-07-14 NOTE — Telephone Encounter (Signed)
No answer, left message to call if questions or concerns. 

## 2018-07-15 DIAGNOSIS — M1711 Unilateral primary osteoarthritis, right knee: Secondary | ICD-10-CM | POA: Diagnosis not present

## 2018-07-21 ENCOUNTER — Encounter: Payer: Self-pay | Admitting: Family Medicine

## 2018-07-26 ENCOUNTER — Encounter: Payer: Self-pay | Admitting: Gastroenterology

## 2018-07-30 ENCOUNTER — Telehealth: Payer: Self-pay | Admitting: Family Medicine

## 2018-07-30 ENCOUNTER — Other Ambulatory Visit: Payer: Self-pay | Admitting: Family Medicine

## 2018-07-30 NOTE — Telephone Encounter (Signed)
Left message asking pt to call office regarding scheduling mammogram and bone density

## 2018-08-16 ENCOUNTER — Telehealth: Payer: Self-pay | Admitting: Family Medicine

## 2018-08-16 MED ORDER — DULOXETINE HCL 60 MG PO CPEP: ORAL_CAPSULE | ORAL | 3 refills | Status: DC

## 2018-08-16 MED ORDER — BUPROPION HCL ER (XL) 150 MG PO TB24: 150.0000 mg | ORAL_TABLET | Freq: Every day | ORAL | 3 refills | Status: DC

## 2018-08-16 NOTE — Telephone Encounter (Signed)
° ° ° °  1. Which medications need to be refilled? (please list name of each medication and dose if known)   Bupropion XL150 mg Duloxetine 60 mg   2. Which pharmacy/location (including street and city if local pharmacy) is medication to be sent to?[facility]/pt attach form in email through [employer]

## 2018-08-17 NOTE — Telephone Encounter (Signed)
Refills were sent to [facility] on 08/16/2018.

## 2018-08-18 DIAGNOSIS — Z96642 Presence of left artificial hip joint: Secondary | ICD-10-CM | POA: Diagnosis not present

## 2018-08-18 DIAGNOSIS — M810 Age-related osteoporosis without current pathological fracture: Secondary | ICD-10-CM | POA: Diagnosis not present

## 2018-08-18 DIAGNOSIS — Z803 Family history of malignant neoplasm of breast: Secondary | ICD-10-CM | POA: Diagnosis not present

## 2018-08-18 DIAGNOSIS — Z1231 Encounter for screening mammogram for malignant neoplasm of breast: Secondary | ICD-10-CM | POA: Diagnosis not present

## 2018-08-18 LAB — HM DEXA SCAN

## 2018-08-18 LAB — HM MAMMOGRAPHY

## 2018-08-25 ENCOUNTER — Encounter: Payer: Self-pay | Admitting: Family Medicine

## 2018-09-10 ENCOUNTER — Telehealth: Payer: Self-pay | Admitting: *Deleted

## 2018-09-10 ENCOUNTER — Encounter: Payer: Self-pay | Admitting: Family Medicine

## 2018-09-10 NOTE — Telephone Encounter (Signed)
Left message for Lisa Odom to return my call in regards to her Bone Density results.  Osteopenia has progressed to osteoporosis.  She needs to make an appointment to discuss if interested in treatment.  Also needs to make sure she is getting Ca in diet, Vit D supplement and weight bearing exercise.

## 2018-09-13 NOTE — Telephone Encounter (Signed)
Lisa Odom reviewed results on MyChart.  She has scheduled an appointment for January 7 to discuss treatment for osteoporosis.

## 2018-09-13 NOTE — Telephone Encounter (Signed)
Left message for Lisa Odom to return my call in regards to her Bone Density results.  Osteopenia has progressed to osteoporosis.  She needs to make an appointment to discuss if interested in treatment.  Also needs to make sure she is getting Ca in diet, Vit D supplement and weight bearing exercise.  I have also released results and recommendation on her MyChart.

## 2018-09-15 ENCOUNTER — Other Ambulatory Visit: Payer: Self-pay | Admitting: Family Medicine

## 2018-09-15 NOTE — Telephone Encounter (Signed)
Pt stated she isn't taking (relafen) nabumetone it was given to  Her for her knee and given to her for a month. Can you call this medication in at [facility]

## 2018-09-16 NOTE — Telephone Encounter (Signed)
Okay to refill Relafen?

## 2018-10-05 ENCOUNTER — Ambulatory Visit (INDEPENDENT_AMBULATORY_CARE_PROVIDER_SITE_OTHER): Payer: Medicare Other | Admitting: Family Medicine

## 2018-10-05 VITALS — BP 124/70 | HR 87 | Temp 97.7°F | Ht 61.5 in | Wt 145.8 lb

## 2018-10-05 DIAGNOSIS — M810 Age-related osteoporosis without current pathological fracture: Secondary | ICD-10-CM

## 2018-10-05 MED ORDER — ALENDRONATE SODIUM 70 MG PO TABS: 70.0000 mg | ORAL_TABLET | ORAL | 3 refills | Status: DC

## 2018-10-05 NOTE — Progress Notes (Signed)
Subjective:    Patient ID: Lisa Odom, female    DOB: April 02, 1956, 63 y.o.   MRN: [id number]  HPI 63 year old female pt presents for discussion on new diagnosis osteoporosis.  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 Lowest T score -3.3  2016 DEXA:  Lowest t score -2.0 in spine osteopenia   She is on PPI.. cannot come off without reflux.   Calcium in diet and vit D supplement. She is having trouble with weight bearing exersise... limited in right knee pain.  S/P cortisone , gel inf... needs knee replacment.  Meloxicam help some with pain... no reflux with this.  Social History /Family History/Past Medical History reviewed in detail and updated in EMR if needed. Blood pressure 124/70, pulse 87, temperature 97.7 F (36.5 C), temperature source Oral, height 5' 1.5" (1.562 m), weight 145 lb 12 oz (66.1 kg).  Review of Systems  Constitutional: Negative for fatigue and fever.  HENT: Negative for congestion.   Eyes: Negative for pain.  Respiratory: Negative for cough and shortness of breath.   Cardiovascular: Negative for chest pain, palpitations and leg swelling.  Gastrointestinal: Negative for abdominal pain.  Genitourinary: Negative for dysuria and vaginal bleeding.  Musculoskeletal: Positive for arthralgias, back pain and myalgias. Negative for joint swelling.  Neurological: Negative for syncope, light-headedness and headaches.  Psychiatric/Behavioral: Negative for dysphoric mood.       Objective:   Physical Exam Constitutional:      General: She is not in acute distress.    Appearance: Normal appearance. She is well-developed. She is not ill-appearing or toxic-appearing.  HENT:     Head: Normocephalic.     Right Ear: Hearing, tympanic membrane, ear canal and external ear normal. Tympanic membrane is not erythematous, retracted or bulging.     Left Ear: Hearing, tympanic membrane, ear canal and external ear normal. Tympanic  membrane is not erythematous, retracted or bulging.     Nose: No mucosal edema or rhinorrhea.     Right Sinus: No maxillary sinus tenderness or frontal sinus tenderness.     Left Sinus: No maxillary sinus tenderness or frontal sinus tenderness.     Mouth/Throat:     Pharynx: Uvula midline.  Eyes:     General: Lids are normal. Lids are everted, no foreign bodies appreciated.     Conjunctiva/sclera: Conjunctivae normal.     Pupils: Pupils are equal, round, and reactive to light.  Neck:     Musculoskeletal: Normal range of motion and neck supple.     Thyroid: No thyroid mass or thyromegaly.     Vascular: No carotid bruit.     Trachea: Trachea normal.  Cardiovascular:     Rate and Rhythm: Normal rate and regular rhythm.     Pulses: Normal pulses.     Heart sounds: Normal heart sounds, S1 normal and S2 normal. No murmur. No friction rub. No gallop.   Pulmonary:     Effort: Pulmonary effort is normal. No tachypnea or respiratory distress.     Breath sounds: Normal breath sounds. No decreased breath sounds, wheezing, rhonchi or rales.  Abdominal:     General: Bowel sounds are normal.     Palpations: Abdomen is soft.     Tenderness: There is no abdominal tenderness.  Skin:    General: Skin is warm and dry.     Findings: No rash.  Neurological:     Mental Status: She is alert.  Psychiatric:  Mood and Affect: Mood is not anxious or depressed.        Speech: Speech normal.        Behavior: Behavior normal. Behavior is cooperative.        Thought Content: Thought content normal.        Judgment: Judgment normal.           Assessment & Plan:

## 2018-10-05 NOTE — Patient Instructions (Addendum)
Start weekly alendronate. Do not lay down after taking for 1 hour, and take 1 hour from food.  Continue pantoprazole for reflux.  Call if reflux recurring with new medication.  Will recheck bone density in 2 years.   Osteoporosis  Osteoporosis is thinning and loss of density in your bones. Osteoporosis makes bones more brittle and fragile and more likely to break (fracture). Over time, osteoporosis can cause your bones to become so weak that they fracture after a minor fall. Bones in the hip, wrist, and spine are most likely to fracture due to osteoporosis. What are the causes? The exact cause of this condition is not known. What increases the risk? You may be at greater risk for osteoporosis if you:  Have a family history of the condition.  Have poor nutrition.  Use steroid medicines, such as prednisone.  Are female.  Are age 37 or older.  Smoke or have a history of smoking.  Are not physically active (are sedentary).  Are white (Caucasian) or of Asian descent.  Have a small body frame.  Take certain medicines, such as antiseizure medicines. What are the signs or symptoms? A fracture might be the first sign of osteoporosis, especially if the fracture results from a fall or injury that usually would not cause a bone to break. Other signs and symptoms include:  Pain in the neck or low back.  Stooped posture.  Loss of height. How is this diagnosed? This condition may be diagnosed based on:  Your medical history.  A physical exam.  A bone mineral density test, also called a DXA or DEXA test (dual-energy X-ray absorptiometry test). This test uses X-rays to measure the amount of minerals in your bones. How is this treated? The goal of treatment is to strengthen your bones and lower your risk for a fracture. Treatment may involve:  Making lifestyle changes, such as: ? Including foods with more calcium and vitamin D in your diet. ? Doing weight-bearing and  muscle-strengthening exercises. ? Stopping tobacco use. ? Limiting alcohol intake.  Taking medicine to slow the process of bone loss or to increase bone density. ALENDRONATE  Taking daily supplements of calcium and vitamin D.  Monitoring your levels of calcium and vitamin D. Follow these instructions at home:  Activity  Exercise as told by your health care provider. Ask your health care provider what exercises and activities are safe for you. You should do: ? Exercises that make you work against gravity (weight-bearing exercises), such as tai chi, yoga, or walking. ? Exercises to strengthen muscles, such as lifting weights. Lifestyle  Limit alcohol intake to no more than 1 drink a day for nonpregnant women and 2 drinks a day for men. One drink equals 12 oz of beer, 5 oz of wine, or 1 oz of hard liquor.  Do not use any products that contain nicotine or tobacco, such as cigarettes and e-cigarettes. If you need help quitting, ask your health care provider. Preventing falls  Use devices to help you move around (mobility aids) as needed, such as canes, walkers, scooters, or crutches.  Keep rooms well-lit and clutter-free.  Remove tripping hazards from walkways, including cords and throw rugs.  Install grab bars in bathrooms and safety rails on stairs.  Use rubber mats in the bathroom and other areas that are often wet or slippery.  Wear closed-toe shoes that fit well and support your feet. Wear shoes that have rubber soles or low heels.  Review your medicines with your health  care provider. Some medicines can cause dizziness or changes in blood pressure, which can increase your risk of falling. General instructions  Include calcium and vitamin D in your diet. Calcium is important for bone health, and vitamin D helps your body to absorb calcium. Good sources of calcium and vitamin D include: ? Certain fatty fish, such as salmon and tuna. ? Products that have calcium and vitamin D  added to them (fortified products), such as fortified cereals. ? Egg yolks. ? Cheese. ? Liver.  Take over-the-counter and prescription medicines only as told by your health care provider.  Keep all follow-up visits as told by your health care provider. This is important. Contact a health care provider if:  You have never been screened for osteoporosis and you are: ? A woman who is age 64 or older. ? A man who is age 14 or older. Get help right away if:  You fall or injure yourself. Summary  Osteoporosis is thinning and loss of density in your bones. This makes bones more brittle and fragile and more likely to break (fracture),even with minor falls.  The goal of treatment is to strengthen your bones and reduce your risk for a fracture.  Include calcium and vitamin D in your diet. Calcium is important for bone health, and vitamin D helps your body to absorb calcium.  Talk with your health care provider about screening for osteoporosis if you are a woman who is age 3 or older, or a man who is age 71 or older. This information is not intended to replace advice given to you by your health care provider. Make sure you discuss any questions you have with your health care provider. Document Released: 06/25/2005 Document Revised: 07/10/2017 Document Reviewed: 07/10/2017 Elsevier Interactive Patient Education  2019 [employer].

## 2018-10-05 NOTE — Assessment & Plan Note (Signed)
Start fosamax 70 mg weekly.  continue ca in diet and vit D. Try to get weight bearing exercise for example on elliptical.  Reviewed ways to avoid  GERD with medication.  Recheck in 2 years.

## 2018-10-06 DIAGNOSIS — H04222 Epiphora due to insufficient drainage, left lacrimal gland: Secondary | ICD-10-CM | POA: Diagnosis not present

## 2018-10-11 DIAGNOSIS — E1140 Type 2 diabetes mellitus with diabetic neuropathy, unspecified: Secondary | ICD-10-CM

## 2018-10-11 MED ORDER — GLUCOSE BLOOD VI STRP: ORAL_STRIP | 3 refills | Status: DC

## 2018-10-11 MED ORDER — ACCU-CHEK AVIVA PLUS W/DEVICE KIT: PACK | 0 refills | Status: DC

## 2018-11-04 DIAGNOSIS — M1711 Unilateral primary osteoarthritis, right knee: Secondary | ICD-10-CM | POA: Diagnosis not present

## 2018-11-05 ENCOUNTER — Other Ambulatory Visit: Payer: Self-pay | Admitting: Orthopedic Surgery

## 2018-11-12 ENCOUNTER — Other Ambulatory Visit: Payer: Self-pay | Admitting: *Deleted

## 2018-11-12 MED ORDER — ACCU-CHEK SOFTCLIX LANCETS MISC: 3 refills | Status: DC

## 2018-11-17 ENCOUNTER — Encounter: Payer: Self-pay | Admitting: *Deleted

## 2018-11-18 MED ORDER — MELOXICAM 15 MG PO TABS: 15.0000 mg | ORAL_TABLET | Freq: Every day | ORAL | 0 refills | Status: DC

## 2018-11-18 NOTE — Telephone Encounter (Signed)
Patient has scheduled her surgical clearance appointment.  She is requesting refill on her Meloxicam to be sent to [facility].

## 2018-11-23 ENCOUNTER — Encounter: Payer: Self-pay | Admitting: Family Medicine

## 2018-11-23 ENCOUNTER — Ambulatory Visit (INDEPENDENT_AMBULATORY_CARE_PROVIDER_SITE_OTHER): Payer: Medicare Other | Admitting: Family Medicine

## 2018-11-23 VITALS — BP 120/82 | HR 91 | Temp 97.5°F | Resp 14 | Ht 61.5 in | Wt 146.8 lb

## 2018-11-23 DIAGNOSIS — E7800 Pure hypercholesterolemia, unspecified: Secondary | ICD-10-CM | POA: Diagnosis not present

## 2018-11-23 DIAGNOSIS — Z01818 Encounter for other preprocedural examination: Secondary | ICD-10-CM | POA: Insufficient documentation

## 2018-11-23 DIAGNOSIS — I10 Essential (primary) hypertension: Secondary | ICD-10-CM

## 2018-11-23 DIAGNOSIS — E1140 Type 2 diabetes mellitus with diabetic neuropathy, unspecified: Secondary | ICD-10-CM

## 2018-11-23 LAB — POCT GLYCOSYLATED HEMOGLOBIN (HGB A1C): HEMOGLOBIN A1C: 6.2 % — AB (ref 4.0–5.6)

## 2018-11-23 NOTE — Assessment & Plan Note (Signed)
Stable control on no medication. 

## 2018-11-23 NOTE — Patient Instructions (Addendum)
Hold Excedrin migraine and meloxicam 5 days prior to surgery.  Work on low [employer].

## 2018-11-23 NOTE — Progress Notes (Signed)
Subjective:    Patient ID: Lisa Odom, female    DOB: July 18, 1956, 63 y.o.   MRN: [id number]  HPI  63 year old female presents for surgical clearance for upcoming right total knee arthroplasty by Cassie Freer, Dr. Berenice Primas on 12/17/2018.  She has a history of high cholesterol, HTN, diabetes, but these issues are all well controlled on her current regimen. Lab Results  Component Value Date   HGBA1C 5.9 (A) 06/22/2018   BP Readings from Last 3 Encounters:  11/23/18 120/82  10/05/18 124/70  07/13/18 115/72    She denies chest pain, no SOB, no lightheadedness.   No previous complications with surgeries, most recent hip replacement. Social History /Family History/Past Medical History reviewed in detail and updated in EMR if needed. Blood pressure 120/82, pulse 91, temperature (!) 97.5 F (36.4 C), temperature source Oral, resp. rate 14, height 5' 1.5" (1.562 m), weight 146 lb 12 oz (66.6 kg), SpO2 96 %.  Review of Systems  Constitutional: Negative for fatigue and fever.  HENT: Negative for congestion.   Eyes: Negative for pain.  Respiratory: Negative for cough and shortness of breath.   Cardiovascular: Negative for chest pain, palpitations and leg swelling.  Gastrointestinal: Negative for abdominal pain.  Genitourinary: Negative for dysuria and vaginal bleeding.  Musculoskeletal: Negative for back pain.  Neurological: Negative for syncope, light-headedness and headaches.  Psychiatric/Behavioral: Negative for dysphoric mood.       Objective:   Physical Exam Constitutional:      General: She is not in acute distress.    Appearance: Normal appearance. She is well-developed. She is not ill-appearing or toxic-appearing.  HENT:     Head: Normocephalic.     Right Ear: Hearing, tympanic membrane, ear canal and external ear normal. Tympanic membrane is not erythematous, retracted or bulging.     Left Ear: Hearing, tympanic membrane, ear canal and external ear normal.  Tympanic membrane is not erythematous, retracted or bulging.     Nose: No mucosal edema or rhinorrhea.     Right Sinus: No maxillary sinus tenderness or frontal sinus tenderness.     Left Sinus: No maxillary sinus tenderness or frontal sinus tenderness.     Mouth/Throat:     Pharynx: Uvula midline.  Eyes:     General: Lids are normal. Lids are everted, no foreign bodies appreciated.     Conjunctiva/sclera: Conjunctivae normal.     Pupils: Pupils are equal, round, and reactive to light.  Neck:     Musculoskeletal: Normal range of motion and neck supple.     Thyroid: No thyroid mass or thyromegaly.     Vascular: No carotid bruit.     Trachea: Trachea normal.  Cardiovascular:     Rate and Rhythm: Normal rate and regular rhythm.     Pulses: Normal pulses.     Heart sounds: Normal heart sounds, S1 normal and S2 normal. No murmur. No friction rub. No gallop.   Pulmonary:     Effort: Pulmonary effort is normal. No tachypnea or respiratory distress.     Breath sounds: Normal breath sounds. No decreased breath sounds, wheezing, rhonchi or rales.  Abdominal:     General: Bowel sounds are normal.     Palpations: Abdomen is soft.     Tenderness: There is no abdominal tenderness.  Skin:    General: Skin is warm and dry.     Findings: No rash.  Neurological:     Mental Status: She is alert.  Psychiatric:  Mood and Affect: Mood is not anxious or depressed.        Speech: Speech normal.        Behavior: Behavior normal. Behavior is cooperative.        Thought Content: Thought content normal.        Judgment: Judgment normal.           Assessment & Plan:

## 2018-11-23 NOTE — Assessment & Plan Note (Signed)
EKG  With nonspecific changes unchaged from previous EKG.   Diabetes stable on no medication.  Moderate risk for complication given DM history. Encourage pt to decrease carbs in diet. No additional cardiopulmonary work up needed for clearance.

## 2018-12-08 ENCOUNTER — Ambulatory Visit: Payer: Self-pay

## 2018-12-08 NOTE — Patient Instructions (Addendum)
Lisa Odom  12/08/2018   Your procedure is scheduled on: 12-17-18   Report to [facility] Main  Entrance              Report to admitting at      0700 AM    Call this number if you have problems the morning of surgery [phone]    Remember: Do not eat food or drink liquids :After Midnight.  BRUSH YOUR TEETH MORNING OF SURGERY AND RINSE YOUR MOUTH OUT, NO CHEWING GUM CANDY OR MINTS.     Take these medicines the morning of surgery with A SIP OF WATER: protonix, gabapentin, allegra if needed, cymbalta, wellbutrin, lipitor DO NOT TAKE ANY DIABETIC MEDICATIONS DAY OF YOUR SURGERY                               You may not have any metal on your body including hair pins and              piercings  Do not wear jewelry,              lotions, powders or perfumes, deodorant                        Men may shave face and neck.   Do not bring valuables to the hospital. [place].  Contacts, dentures or bridgework may not be worn into surgery.  Leave suitcase in the car. After surgery it may be brought to your room.                 Please read over the following fact sheets you were given: _____________________________________________________________________           [facility] - Preparing for Surgery Before surgery, you can play an important role.  Because skin is not sterile, your skin needs to be as free of germs as possible.  You can reduce the number of germs on your skin by washing with CHG (chlorahexidine gluconate) soap before surgery.  CHG is an antiseptic cleaner which kills germs and bonds with the skin to continue killing germs even after washing. Please DO NOT use if you have an allergy to CHG or antibacterial soaps.  If your skin becomes reddened/irritated stop using the CHG and inform your nurse when you arrive at Short Stay. Do not shave (including legs and underarms) for at least 48 hours  prior to the first CHG shower.  You may shave your face/neck. Please follow these instructions carefully:  1.  Shower with CHG Soap the night before surgery and the  morning of Surgery.  2.  If you choose to wash your hair, wash your hair first as usual with your  normal  shampoo.  3.  After you shampoo, rinse your hair and body thoroughly to remove the  shampoo.                           4.  Use CHG as you would any other liquid soap.  You can apply chg directly  to the skin and wash  Gently with a scrungie or clean washcloth.  5.  Apply the CHG Soap to your body ONLY FROM THE NECK DOWN.   Do not use on face/ open                           Wound or open sores. Avoid contact with eyes, ears mouth and genitals (private parts).                       Wash face,  Genitals (private parts) with your normal soap.             6.  Wash thoroughly, paying special attention to the area where your surgery  will be performed.  7.  Thoroughly rinse your body with warm water from the neck down.  8.  DO NOT shower/wash with your normal soap after using and rinsing off  the CHG Soap.                9.  Pat yourself dry with a clean towel.            10.  Wear clean pajamas.            11.  Place clean sheets on your bed the night of your first shower and do not  sleep with pets. Day of Surgery : Do not apply any lotions/deodorants the morning of surgery.  Please wear clean clothes to the hospital/surgery center.  FAILURE TO FOLLOW THESE INSTRUCTIONS MAY RESULT IN THE CANCELLATION OF YOUR SURGERY PATIENT SIGNATURE_________________________________  NURSE SIGNATURE__________________________________  ________________________________________________________________________  WHAT IS A BLOOD TRANSFUSION? Blood Transfusion Information  A transfusion is the replacement of blood or some of its parts. Blood is made up of multiple cells which provide different functions.  Red blood cells carry  oxygen and are used for blood loss replacement.  White blood cells fight against infection.  Platelets control bleeding.  Plasma helps clot blood.  Other blood products are available for specialized needs, such as hemophilia or other clotting disorders. BEFORE THE TRANSFUSION  Who gives blood for transfusions?   Healthy volunteers who are fully evaluated to make sure their blood is safe. This is blood bank blood. Transfusion therapy is the safest it has ever been in the practice of medicine. Before blood is taken from a donor, a complete history is taken to make sure that person has no history of diseases nor engages in risky social behavior (examples are intravenous drug use or sexual activity with multiple partners). The donor's travel history is screened to minimize risk of transmitting infections, such as malaria. The donated blood is tested for signs of infectious diseases, such as HIV and hepatitis. The blood is then tested to be sure it is compatible with you in order to minimize the chance of a transfusion reaction. If you or a relative donates blood, this is often done in anticipation of surgery and is not appropriate for emergency situations. It takes many days to process the donated blood. RISKS AND COMPLICATIONS Although transfusion therapy is very safe and saves many lives, the main dangers of transfusion include:   Getting an infectious disease.  Developing a transfusion reaction. This is an allergic reaction to something in the blood you were given. Every precaution is taken to prevent this. The decision to have a blood transfusion has been considered carefully by your caregiver before blood is given. Blood is not given unless the benefits outweigh  the risks. AFTER THE TRANSFUSION  Right after receiving a blood transfusion, you will usually feel much better and more energetic. This is especially true if your red blood cells have gotten low (anemic). The transfusion raises the  level of the red blood cells which carry oxygen, and this usually causes an energy increase.  The nurse administering the transfusion will monitor you carefully for complications. HOME CARE INSTRUCTIONS  No special instructions are needed after a transfusion. You may find your energy is better. Speak with your caregiver about any limitations on activity for underlying diseases you may have. SEEK MEDICAL CARE IF:   Your condition is not improving after your transfusion.  You develop redness or irritation at the intravenous (IV) site. SEEK IMMEDIATE MEDICAL CARE IF:  Any of the following symptoms occur over the next 12 hours:  Shaking chills.  You have a temperature by mouth above 102 F (38.9 C), not controlled by medicine.  Chest, back, or muscle pain.  People around you feel you are not acting correctly or are confused.  Shortness of breath or difficulty breathing.  Dizziness and fainting.  You get a rash or develop hives.  You have a decrease in urine output.  Your urine turns a dark color or changes to pink, red, or brown. Any of the following symptoms occur over the next 10 days:  You have a temperature by mouth above 102 F (38.9 C), not controlled by medicine.  Shortness of breath.  Weakness after normal activity.  The white part of the eye turns yellow (jaundice).  You have a decrease in the amount of urine or are urinating less often.  Your urine turns a dark color or changes to pink, red, or brown. Document Released: 09/12/2000 Document Revised: 12/08/2011 Document Reviewed: 05/01/2008 ExitCare Patient Information 2014 [place].  _______________________________________________________________________  Incentive Spirometer  An incentive spirometer is a tool that can help keep your lungs clear and active. This tool measures how well you are filling your lungs with each breath. Taking long deep breaths may help reverse or decrease the chance of  developing breathing (pulmonary) problems (especially infection) following:  A long period of time when you are unable to move or be active. BEFORE THE PROCEDURE   If the spirometer includes an indicator to show your best effort, your nurse or respiratory therapist will set it to a desired goal.  If possible, sit up straight or lean slightly forward. Try not to slouch.  Hold the incentive spirometer in an upright position. INSTRUCTIONS FOR USE  1. Sit on the edge of your bed if possible, or sit up as far as you can in bed or on a chair. 2. Hold the incentive spirometer in an upright position. 3. Breathe out normally. 4. Place the mouthpiece in your mouth and seal your lips tightly around it. 5. Breathe in slowly and as deeply as possible, raising the piston or the ball toward the top of the column. 6. Hold your breath for 3-5 seconds or for as long as possible. Allow the piston or ball to fall to the bottom of the column. 7. Remove the mouthpiece from your mouth and breathe out normally. 8. Rest for a few seconds and repeat Steps 1 through 7 at least 10 times every 1-2 hours when you are awake. Take your time and take a few normal breaths between deep breaths. 9. The spirometer may include an indicator to show your best effort. Use the indicator as a goal to work  toward during each repetition. 10. After each set of 10 deep breaths, practice coughing to be sure your lungs are clear. If you have an incision (the cut made at the time of surgery), support your incision when coughing by placing a pillow or rolled up towels firmly against it. Once you are able to get out of bed, walk around indoors and cough well. You may stop using the incentive spirometer when instructed by your caregiver.  RISKS AND COMPLICATIONS  Take your time so you do not get dizzy or light-headed.  If you are in pain, you may need to take or ask for pain medication before doing incentive spirometry. It is harder to take a  deep breath if you are having pain. AFTER USE  Rest and breathe slowly and easily.  It can be helpful to keep track of a log of your progress. Your caregiver can provide you with a simple table to help with this. If you are using the spirometer at home, follow these instructions: [place] IF:   You are having difficultly using the spirometer.  You have trouble using the spirometer as often as instructed.  Your pain medication is not giving enough relief while using the spirometer.  You develop fever of 100.5 F (38.1 C) or higher. SEEK IMMEDIATE MEDICAL CARE IF:   You cough up bloody sputum that had not been present before.  You develop fever of 102 F (38.9 C) or greater.  You develop worsening pain at or near the incision site. MAKE SURE YOU:   Understand these instructions.  Will watch your condition.  Will get help right away if you are not doing well or get worse. Document Released: 01/26/2007 Document Revised: 12/08/2011 Document Reviewed: 03/29/2007 [facility] Patient Information 2014 [place], [place].   ________________________________________________________________________

## 2018-12-09 ENCOUNTER — Encounter (HOSPITAL_COMMUNITY)
Admission: RE | Admit: 2018-12-09 | Discharge: 2018-12-09 | Disposition: A | Discharge status: Home / Self Care | Payer: Medicare Other | Source: Ambulatory Visit | Attending: Orthopedic Surgery | Admitting: Orthopedic Surgery

## 2018-12-09 ENCOUNTER — Encounter (HOSPITAL_COMMUNITY): Payer: Self-pay

## 2018-12-09 ENCOUNTER — Other Ambulatory Visit: Payer: Self-pay

## 2018-12-09 DIAGNOSIS — Z01818 Encounter for other preprocedural examination: Secondary | ICD-10-CM | POA: Insufficient documentation

## 2018-12-09 DIAGNOSIS — M1711 Unilateral primary osteoarthritis, right knee: Secondary | ICD-10-CM | POA: Diagnosis not present

## 2018-12-09 HISTORY — DX: Unspecified osteoarthritis, unspecified site: M19.90

## 2018-12-09 LAB — URINALYSIS, ROUTINE W REFLEX MICROSCOPIC
Bacteria, UA: NONE SEEN
Glucose, UA: NEGATIVE mg/dL
Hgb urine dipstick: NEGATIVE
KETONES UR: 5 mg/dL — AB
Nitrite: NEGATIVE
Protein, ur: 30 mg/dL — AB
Specific Gravity, Urine: 1.034 — ABNORMAL HIGH (ref 1.005–1.030)
pH: 5 (ref 5.0–8.0)

## 2018-12-09 LAB — COMPREHENSIVE METABOLIC PANEL
ALT: 26 U/L (ref 0–44)
AST: 27 U/L (ref 15–41)
Albumin: 4.3 g/dL (ref 3.5–5.0)
Alkaline Phosphatase: 87 U/L (ref 38–126)
Anion gap: 9 (ref 5–15)
BUN: 21 mg/dL (ref 8–23)
CO2: 25 mmol/L (ref 22–32)
Calcium: 10 mg/dL (ref 8.9–10.3)
Chloride: 107 mmol/L (ref 98–111)
Creatinine, Ser: 0.8 mg/dL (ref 0.44–1.00)
GFR calc Af Amer: >60 mL/min (ref >60)
GFR calc non Af Amer: >60 mL/min (ref >60)
Glucose, Bld: 109 mg/dL — ABNORMAL HIGH (ref 70–99)
Potassium: 3.7 mmol/L (ref 3.5–5.1)
Sodium: 141 mmol/L (ref 135–145)
Total Bilirubin: 0.9 mg/dL (ref 0.3–1.2)
Total Protein: 7 g/dL (ref 6.5–8.1)

## 2018-12-09 LAB — CBC WITH DIFFERENTIAL/PLATELET
Abs Immature Granulocytes: 0.08 10*3/uL — ABNORMAL HIGH (ref 0.00–0.07)
Basophils Absolute: 0.1 10*3/uL (ref 0.0–0.1)
Basophils Relative: 1 %
EOS ABS: 0.4 10*3/uL (ref 0.0–0.5)
EOS PCT: 5 %
HCT: 45.1 % (ref 36.0–46.0)
Hemoglobin: 13.7 g/dL (ref 12.0–15.0)
Immature Granulocytes: 1 %
Lymphocytes Relative: 26 %
Lymphs Abs: 1.9 10*3/uL (ref 0.7–4.0)
MCH: 28.4 pg (ref 26.0–34.0)
MCHC: 30.4 g/dL (ref 30.0–36.0)
MCV: 93.6 fL (ref 80.0–100.0)
Monocytes Absolute: 0.6 10*3/uL (ref 0.1–1.0)
Monocytes Relative: 8 %
Neutro Abs: 4.5 10*3/uL (ref 1.7–7.7)
Neutrophils Relative %: 59 %
Platelets: 430 10*3/uL — ABNORMAL HIGH (ref 150–400)
RBC: 4.82 MIL/uL (ref 3.87–5.11)
RDW: 13.4 % (ref 11.5–15.5)
WBC: 7.5 10*3/uL (ref 4.0–10.5)
nRBC: 0 % (ref 0.0–0.2)

## 2018-12-09 LAB — SURGICAL PCR SCREEN
MRSA, PCR: NEGATIVE
Staphylococcus aureus: NEGATIVE

## 2018-12-09 LAB — PROTIME-INR
INR: 0.9 (ref 0.8–1.2)
PROTHROMBIN TIME: 12.2 s (ref 11.4–15.2)

## 2018-12-09 LAB — APTT: aPTT: 32 seconds (ref 24–36)

## 2018-12-09 NOTE — Progress Notes (Signed)
Clearance 11-23-18 Dr. Diona Browner epic  EKG 11-23-18 epic  [id number] epic

## 2018-12-10 LAB — TYPE AND SCREEN
ABO/RH(D): O POS
Antibody Screen: NEGATIVE

## 2018-12-17 ENCOUNTER — Encounter (HOSPITAL_COMMUNITY): Admission: RE | Payer: Self-pay | Source: Home / Self Care

## 2018-12-17 ENCOUNTER — Ambulatory Visit (HOSPITAL_COMMUNITY): Admission: RE | Admit: 2018-12-17 | Payer: Medicare Other | Source: Home / Self Care | Admitting: Orthopedic Surgery

## 2018-12-17 SURGERY — ARTHROPLASTY, KNEE, TOTAL
Anesthesia: Spinal | Laterality: Right

## 2018-12-29 ENCOUNTER — Other Ambulatory Visit: Payer: Self-pay | Admitting: *Deleted

## 2018-12-29 NOTE — Telephone Encounter (Signed)
Last office visit 11/23/2018 for Surgical Clearance.  Last refilled 07/30/2018 for #270 with 3 refills.  This request is from a different pharmacy (mail order).  Next Appt: 07/08/2019 for CPE.

## 2018-12-30 MED ORDER — GABAPENTIN 600 MG PO TABS: 300.0000 mg | ORAL_TABLET | ORAL | 3 refills | Status: DC

## 2019-01-19 ENCOUNTER — Other Ambulatory Visit: Payer: Self-pay | Admitting: Family Medicine

## 2019-01-19 NOTE — Telephone Encounter (Signed)
Last office visit 11/23/2018 for surgical clearance.  Last refilled 11/18/2018 for #90 with no refills.  CPE scheduled for 07/08/2019.

## 2019-02-16 ENCOUNTER — Other Ambulatory Visit: Payer: Self-pay | Admitting: Orthopedic Surgery

## 2019-02-17 DIAGNOSIS — M67912 Unspecified disorder of synovium and tendon, left shoulder: Secondary | ICD-10-CM | POA: Diagnosis not present

## 2019-03-01 ENCOUNTER — Other Ambulatory Visit: Payer: Self-pay | Admitting: Family Medicine

## 2019-03-01 DIAGNOSIS — E1140 Type 2 diabetes mellitus with diabetic neuropathy, unspecified: Secondary | ICD-10-CM

## 2019-03-01 NOTE — Telephone Encounter (Signed)
Last office visit 11/23/2018 for surgical clearance.  Last refilled 01/19/2019 for #30 with 1 refill.  CPE scheduled for 07/08/2019.

## 2019-03-03 NOTE — Progress Notes (Signed)
11/23/2018- noted in [place]

## 2019-03-03 NOTE — Patient Instructions (Addendum)
Lisa Odom  03/03/2019   Your procedure is scheduled on: Friday 03/11/2019  Report to [facility] Main  Entrance              Report to admitting at  [place] 19 TEST ON 03-08-19  @ 11:00 AM, THIS TEST MUST BE DONE BEFORE SURGERY, COME TO [place]. ONCE YOU HAVE COMPLETED THE COVID TEST, PLEASE BEGIN THE QUARANTINE INSTRUCTIONS AS INDICATED IN YOUR HANDOUT   Call this number if you have problems the morning of surgery [phone]    Remember: Do not eat food  :After Midnight.              NO SOLID FOOD AFTER MIDNIGHT THE NIGHT PRIOR TO SURGERY. NOTHING BY MOUTH EXCEPT CLEAR LIQUIDS UNTIL 0430 am.               PLEASE FINISH Gatorade G 2  DRINK PER SURGEON ORDER WHICH NEEDS TO BE COMPLETED AT   0430 am.   CLEAR LIQUID DIET   Foods Allowed                                                                     Foods Excluded  Coffee and tea, regular and decaf                             liquids that you cannot  Plain Jell-O in any flavor                                             see through such as: Fruit ices (not with fruit pulp)                                     milk, soups, orange juice  Iced Popsicles                                    All solid food Carbonated beverages, regular and diet                                    Cranberry, grape and apple juices Sports drinks like Gatorade Lightly seasoned clear broth or consume(fat free) Sugar, honey syrup  Sample Menu Breakfast                                Lunch                                     Supper Cranberry  juice                    Beef broth                            Chicken broth Jell-O                                     Grape juice                           Apple juice Coffee or tea                        Jell-O                                      Popsicle                                                 Coffee or tea                        Coffee or tea  _____________________________________________________________________              BRUSH YOUR TEETH MORNING OF SURGERY AND RINSE YOUR MOUTH OUT, NO CHEWING GUM CANDY OR MINTS.     Take these medicines the morning of surgery with A SIP OF WATER: Duloxetine (CYmbalta), Bupropion (Wellbutrin XL), Fexofenadine (Allegra), prn, Alprazolam (Xanax) prn. Gabapentin (Neurontin), and Pantoprazole (Protonix)              DO NOT TAKE ANY DIABETIC MEDICATIONS DAY OF YOUR SURGERY!                                                You may not have any metal on your body including hair pins and              piercings  Do not wear jewelry, make-up, lotions, powders or perfumes, deodorant             Do not wear nail polish.  Do not shave  48 hours prior to surgery.              Do not bring valuables to the hospital. [place].  Contacts, dentures or bridgework may not be worn into surgery.                    Please read over the following fact sheets you were given: _____________________________________________________________________   NO DIABETIC MEDICATIONS TO TAKE!             - Preparing for Surgery Before surgery, you can play an important role.  Because skin is not sterile, your skin needs to be as free of germs as possible.  You can reduce the number of germs on your  skin by washing with CHG (chlorahexidine gluconate) soap before surgery.  CHG is an antiseptic cleaner which kills germs and bonds with the skin to continue killing germs even after washing. Please DO NOT use if you have an allergy to CHG or antibacterial soaps.  If your skin becomes reddened/irritated stop using the CHG and inform your nurse when you arrive at Short Stay. Do not shave (including legs and underarms) for at least 48 hours prior to the first CHG shower.  You may shave your face/neck. Please follow these  instructions carefully:  1.  Shower with CHG Soap the night before surgery and the  morning of Surgery.  2.  If you choose to wash your hair, wash your hair first as usual with your  normal  shampoo.  3.  After you shampoo, rinse your hair and body thoroughly to remove the  shampoo.                           4.  Use CHG as you would any other liquid soap.  You can apply chg directly  to the skin and wash                       Gently with a scrungie or clean washcloth.  5.  Apply the CHG Soap to your body ONLY FROM THE NECK DOWN.   Do not use on face/ open                           Wound or open sores. Avoid contact with eyes, ears mouth and genitals (private parts).                       Wash face,  Genitals (private parts) with your normal soap.             6.  Wash thoroughly, paying special attention to the area where your surgery  will be performed.  7.  Thoroughly rinse your body with warm water from the neck down.  8.  DO NOT shower/wash with your normal soap after using and rinsing off  the CHG Soap.                9.  Pat yourself dry with a clean towel.            10.  Wear clean pajamas.            11.  Place clean sheets on your bed the night of your first shower and do not  sleep with pets. Day of Surgery : Do not apply any lotions/deodorants the morning of surgery.  Please wear clean clothes to the hospital/surgery center.  FAILURE TO FOLLOW THESE INSTRUCTIONS MAY RESULT IN THE CANCELLATION OF YOUR SURGERY PATIENT SIGNATURE_________________________________  NURSE SIGNATURE__________________________________  ________________________________________________________________________   Adam Phenix  An incentive spirometer is a tool that can help keep your lungs clear and active. This tool measures how well you are filling your lungs with each breath. Taking long deep breaths may help reverse or decrease the chance of developing breathing (pulmonary) problems (especially  infection) following:  A long period of time when you are unable to move or be active. BEFORE THE PROCEDURE   If the spirometer includes an indicator to show your best effort, your nurse or respiratory therapist will set it to a desired goal.  If possible,  sit up straight or lean slightly forward. Try not to slouch.  Hold the incentive spirometer in an upright position. INSTRUCTIONS FOR USE  1. Sit on the edge of your bed if possible, or sit up as far as you can in bed or on a chair. 2. Hold the incentive spirometer in an upright position. 3. Breathe out normally. 4. Place the mouthpiece in your mouth and seal your lips tightly around it. 5. Breathe in slowly and as deeply as possible, raising the piston or the ball toward the top of the column. 6. Hold your breath for 3-5 seconds or for as long as possible. Allow the piston or ball to fall to the bottom of the column. 7. Remove the mouthpiece from your mouth and breathe out normally. 8. Rest for a few seconds and repeat Steps 1 through 7 at least 10 times every 1-2 hours when you are awake. Take your time and take a few normal breaths between deep breaths. 9. The spirometer may include an indicator to show your best effort. Use the indicator as a goal to work toward during each repetition. 10. After each set of 10 deep breaths, practice coughing to be sure your lungs are clear. If you have an incision (the cut made at the time of surgery), support your incision when coughing by placing a pillow or rolled up towels firmly against it. Once you are able to get out of bed, walk around indoors and cough well. You may stop using the incentive spirometer when instructed by your caregiver.  RISKS AND COMPLICATIONS  Take your time so you do not get dizzy or light-headed.  If you are in pain, you may need to take or ask for pain medication before doing incentive spirometry. It is harder to take a deep breath if you are having pain. AFTER  USE  Rest and breathe slowly and easily.  It can be helpful to keep track of a log of your progress. Your caregiver can provide you with a simple table to help with this. If you are using the spirometer at home, follow these instructions: [place] IF:   You are having difficultly using the spirometer.  You have trouble using the spirometer as often as instructed.  Your pain medication is not giving enough relief while using the spirometer.  You develop fever of 100.5 F (38.1 C) or higher. SEEK IMMEDIATE MEDICAL CARE IF:   You cough up bloody sputum that had not been present before.  You develop fever of 102 F (38.9 C) or greater.  You develop worsening pain at or near the incision site. MAKE SURE YOU:   Understand these instructions.  Will watch your condition.  Will get help right away if you are not doing well or get worse. Document Released: 01/26/2007 Document Revised: 12/08/2011 Document Reviewed: 03/29/2007 ExitCare Patient Information 2014 Exit[employer], [place].   ________________________________________________________________________  WHAT IS A BLOOD TRANSFUSION? Blood Transfusion Information  A transfusion is the replacement of blood or some of its parts. Blood is made up of multiple cells which provide different functions.  Red blood cells carry oxygen and are used for blood loss replacement.  White blood cells fight against infection.  Platelets control bleeding.  Plasma helps clot blood.  Other blood products are available for specialized needs, such as hemophilia or other clotting disorders. BEFORE THE TRANSFUSION  Who gives blood for transfusions?   Healthy volunteers who are fully evaluated to make sure their blood is safe. This is  blood bank blood. Transfusion therapy is the safest it has ever been in the practice of medicine. Before blood is taken from a donor, a complete history is taken to make sure that person has no history of diseases  nor engages in risky social behavior (examples are intravenous drug use or sexual activity with multiple partners). The donor's travel history is screened to minimize risk of transmitting infections, such as malaria. The donated blood is tested for signs of infectious diseases, such as HIV and hepatitis. The blood is then tested to be sure it is compatible with you in order to minimize the chance of a transfusion reaction. If you or a relative donates blood, this is often done in anticipation of surgery and is not appropriate for emergency situations. It takes many days to process the donated blood. RISKS AND COMPLICATIONS Although transfusion therapy is very safe and saves many lives, the main dangers of transfusion include:   Getting an infectious disease.  Developing a transfusion reaction. This is an allergic reaction to something in the blood you were given. Every precaution is taken to prevent this. The decision to have a blood transfusion has been considered carefully by your caregiver before blood is given. Blood is not given unless the benefits outweigh the risks. AFTER THE TRANSFUSION  Right after receiving a blood transfusion, you will usually feel much better and more energetic. This is especially true if your red blood cells have gotten low (anemic). The transfusion raises the level of the red blood cells which carry oxygen, and this usually causes an energy increase.  The nurse administering the transfusion will monitor you carefully for complications. HOME CARE INSTRUCTIONS  No special instructions are needed after a transfusion. You may find your energy is better. Speak with your caregiver about any limitations on activity for underlying diseases you may have. SEEK MEDICAL CARE IF:   Your condition is not improving after your transfusion.  You develop redness or irritation at the intravenous (IV) site. SEEK IMMEDIATE MEDICAL CARE IF:  Any of the following symptoms occur over the  next 12 hours:  Shaking chills.  You have a temperature by mouth above 102 F (38.9 C), not controlled by medicine.  Chest, back, or muscle pain.  People around you feel you are not acting correctly or are confused.  Shortness of breath or difficulty breathing.  Dizziness and fainting.  You get a rash or develop hives.  You have a decrease in urine output.  Your urine turns a dark color or changes to pink, red, or brown. Any of the following symptoms occur over the next 10 days:  You have a temperature by mouth above 102 F (38.9 C), not controlled by medicine.  Shortness of breath.  Weakness after normal activity.  The white part of the eye turns yellow (jaundice).  You have a decrease in the amount of urine or are urinating less often.  Your urine turns a dark color or changes to pink, red, or brown. Document Released: 09/12/2000 Document Revised: 12/08/2011 Document Reviewed: 05/01/2008 [facility] Patient Information 2014 [place], [place].  _______________________________________________________________________

## 2019-03-04 ENCOUNTER — Other Ambulatory Visit: Payer: Self-pay

## 2019-03-04 ENCOUNTER — Encounter (HOSPITAL_COMMUNITY)
Admission: RE | Admit: 2019-03-04 | Discharge: 2019-03-04 | Disposition: A | Discharge status: Home / Self Care | Payer: Medicare Other | Source: Ambulatory Visit | Attending: Orthopedic Surgery | Admitting: Orthopedic Surgery

## 2019-03-04 ENCOUNTER — Encounter (HOSPITAL_COMMUNITY): Payer: Self-pay

## 2019-03-04 ENCOUNTER — Ambulatory Visit (HOSPITAL_COMMUNITY)
Admission: RE | Admit: 2019-03-04 | Discharge: 2019-03-04 | Disposition: A | Discharge status: Home / Self Care | Payer: Medicare Other | Source: Ambulatory Visit | Attending: Orthopedic Surgery | Admitting: Orthopedic Surgery

## 2019-03-04 DIAGNOSIS — M25869 Other specified joint disorders, unspecified knee: Secondary | ICD-10-CM | POA: Diagnosis not present

## 2019-03-04 DIAGNOSIS — Z01811 Encounter for preprocedural respiratory examination: Secondary | ICD-10-CM | POA: Diagnosis not present

## 2019-03-04 DIAGNOSIS — M1711 Unilateral primary osteoarthritis, right knee: Secondary | ICD-10-CM | POA: Diagnosis not present

## 2019-03-04 DIAGNOSIS — Z01818 Encounter for other preprocedural examination: Secondary | ICD-10-CM | POA: Diagnosis not present

## 2019-03-04 LAB — URINALYSIS, ROUTINE W REFLEX MICROSCOPIC
Bacteria, UA: NONE SEEN
Bilirubin Urine: NEGATIVE
Glucose, UA: NEGATIVE mg/dL
Hgb urine dipstick: NEGATIVE
Ketones, ur: NEGATIVE mg/dL
Nitrite: NEGATIVE
Protein, ur: NEGATIVE mg/dL
Specific Gravity, Urine: 1.024 (ref 1.005–1.030)
pH: 7 (ref 5.0–8.0)

## 2019-03-04 LAB — COMPREHENSIVE METABOLIC PANEL
ALT: 23 U/L (ref 0–44)
AST: 19 U/L (ref 15–41)
Albumin: 4 g/dL (ref 3.5–5.0)
Alkaline Phosphatase: 75 U/L (ref 38–126)
Anion gap: 6 (ref 5–15)
BUN: 23 mg/dL (ref 8–23)
CO2: 25 mmol/L (ref 22–32)
Calcium: 8.6 mg/dL — ABNORMAL LOW (ref 8.9–10.3)
Chloride: 109 mmol/L (ref 98–111)
Creatinine, Ser: 0.59 mg/dL (ref 0.44–1.00)
GFR calc Af Amer: >60 mL/min (ref >60)
GFR calc non Af Amer: >60 mL/min (ref >60)
Glucose, Bld: 114 mg/dL — ABNORMAL HIGH (ref 70–99)
Potassium: 4 mmol/L (ref 3.5–5.1)
Sodium: 140 mmol/L (ref 135–145)
Total Bilirubin: 0.5 mg/dL (ref 0.3–1.2)
Total Protein: 6.9 g/dL (ref 6.5–8.1)

## 2019-03-04 LAB — CBC WITH DIFFERENTIAL/PLATELET
Abs Immature Granulocytes: 0.05 10*3/uL (ref 0.00–0.07)
Basophils Absolute: 0.1 10*3/uL (ref 0.0–0.1)
Basophils Relative: 1 %
Eosinophils Absolute: 0.4 10*3/uL (ref 0.0–0.5)
Eosinophils Relative: 6 %
HCT: 40.7 % (ref 36.0–46.0)
Hemoglobin: 12.9 g/dL (ref 12.0–15.0)
Immature Granulocytes: 1 %
Lymphocytes Relative: 28 %
Lymphs Abs: 2.1 10*3/uL (ref 0.7–4.0)
MCH: 29.3 pg (ref 26.0–34.0)
MCHC: 31.7 g/dL (ref 30.0–36.0)
MCV: 92.5 fL (ref 80.0–100.0)
Monocytes Absolute: 0.5 10*3/uL (ref 0.1–1.0)
Monocytes Relative: 7 %
Neutro Abs: 4.4 10*3/uL (ref 1.7–7.7)
Neutrophils Relative %: 57 %
Platelets: 362 10*3/uL (ref 150–400)
RBC: 4.4 MIL/uL (ref 3.87–5.11)
RDW: 14.2 % (ref 11.5–15.5)
WBC: 7.5 10*3/uL (ref 4.0–10.5)
nRBC: 0 % (ref 0.0–0.2)

## 2019-03-04 LAB — PROTIME-INR
INR: 0.9 (ref 0.8–1.2)
Prothrombin Time: 11.6 seconds (ref 11.4–15.2)

## 2019-03-04 LAB — SURGICAL PCR SCREEN
MRSA, PCR: NEGATIVE
Staphylococcus aureus: NEGATIVE

## 2019-03-04 LAB — GLUCOSE, CAPILLARY: Glucose-Capillary: 112 mg/dL — ABNORMAL HIGH (ref 70–99)

## 2019-03-04 LAB — APTT: aPTT: 31 seconds (ref 24–36)

## 2019-03-05 LAB — HEMOGLOBIN A1C
Hgb A1c MFr Bld: 6.4 % — ABNORMAL HIGH (ref 4.8–5.6)
Mean Plasma Glucose: 137 mg/dL

## 2019-03-08 ENCOUNTER — Other Ambulatory Visit: Payer: Self-pay

## 2019-03-08 ENCOUNTER — Other Ambulatory Visit (HOSPITAL_COMMUNITY)
Admission: RE | Admit: 2019-03-08 | Discharge: 2019-03-08 | Disposition: A | Discharge status: Home / Self Care | Payer: Medicare Other | Source: Ambulatory Visit | Attending: Orthopedic Surgery | Admitting: Orthopedic Surgery

## 2019-03-08 DIAGNOSIS — Z01812 Encounter for preprocedural laboratory examination: Secondary | ICD-10-CM | POA: Diagnosis not present

## 2019-03-08 DIAGNOSIS — Z1159 Encounter for screening for other viral diseases: Secondary | ICD-10-CM | POA: Insufficient documentation

## 2019-03-08 NOTE — Anesthesia Preprocedure Evaluation (Addendum)
Anesthesia Evaluation  Patient identified by MRN, date of birth, ID band Patient awake    Reviewed: Allergy & Precautions, NPO status , Patient's Chart, lab work & pertinent test results  Airway Mallampati: I  TM Distance: >3 FB Neck ROM: Full    Dental   Pulmonary    Pulmonary exam normal        Cardiovascular hypertension, Normal cardiovascular exam     Neuro/Psych Anxiety Depression    GI/Hepatic GERD  Medicated and Controlled,  Endo/Other  diabetes, Type 2, Oral Hypoglycemic Agents  Renal/GU      Musculoskeletal   Abdominal   Peds  Hematology   Anesthesia Other Findings   Reproductive/Obstetrics                            Anesthesia Physical Anesthesia Plan  ASA: II  Anesthesia Plan: Spinal   Post-op Pain Management:  Regional for Post-op pain   Induction: Intravenous  PONV Risk Score and Plan: 3 and Ondansetron, Midazolam and Dexamethasone  Airway Management Planned: Simple Face Mask  Additional Equipment:   Intra-op Plan:   Post-operative Plan:   Informed Consent: I have reviewed the patients History and Physical, chart, labs and discussed the procedure including the risks, benefits and alternatives for the proposed anesthesia with the patient or authorized representative who has indicated his/her understanding and acceptance.       Plan Discussed with: CRNA and Surgeon  Anesthesia Plan Comments: (Cleared by PCP 11/23/18, "EKG  With nonspecific changes unchaged from previous EKG. Diabetes stable on no medication.  Moderate risk for complication given DM history.  Encourage pt to decrease carbs in diet.  No additional cardiopulmonary work up needed for clearance." Konrad Felix, PA-C)      Anesthesia Quick Evaluation

## 2019-03-09 LAB — NOVEL CORONAVIRUS, NAA (HOSP ORDER, SEND-OUT TO REF LAB; TAT 18-24 HRS): SARS-CoV-2, NAA: NOT DETECTED

## 2019-03-10 DIAGNOSIS — M1711 Unilateral primary osteoarthritis, right knee: Secondary | ICD-10-CM | POA: Diagnosis not present

## 2019-03-10 DIAGNOSIS — Z96651 Presence of right artificial knee joint: Secondary | ICD-10-CM | POA: Diagnosis not present

## 2019-03-10 MED ORDER — BUPIVACAINE LIPOSOME 1.3 % IJ SUSP
20.0000 mL | Freq: Once | INTRAMUSCULAR | Status: DC
  Filled 2019-03-10: qty 20

## 2019-03-10 NOTE — Progress Notes (Signed)
SPOKE W/ patient via phone.      SCREENING SYMPTOMS OF COVID 19:     COUGH--no   RUNNY NOSE---no    SORE THROAT---no   NASAL CONGESTION----no   SNEEZING---no   SHORTNESS OF BREATH---no   DIFFICULTY BREATHING---no   TEMP >100.0 -----no   UNEXPLAINED BODY ACHES------no   CHILLS --------no    HEADACHES ---------no   LOSS OF SMELL/ TASTE --------no       HAVE YOU OR ANY FAMILY MEMBER TRAVELLED PAST 14 DAYS OUT OF THE    COUNTY---no STATE----no COUNTRY----no   HAVE YOU OR ANY FAMILY MEMBER BEEN EXPOSED TO ANYONE WITH COVID 19?    no 

## 2019-03-11 ENCOUNTER — Other Ambulatory Visit: Payer: Self-pay

## 2019-03-11 ENCOUNTER — Observation Stay (HOSPITAL_COMMUNITY)
Admission: RE | Admit: 2019-03-11 | Discharge: 2019-03-12 | Disposition: A | Discharge status: Home Health | Payer: Medicare Other | Attending: Orthopedic Surgery | Admitting: Orthopedic Surgery

## 2019-03-11 ENCOUNTER — Encounter (HOSPITAL_COMMUNITY): Payer: Self-pay | Admitting: Emergency Medicine

## 2019-03-11 ENCOUNTER — Ambulatory Visit (HOSPITAL_COMMUNITY): Payer: Medicare Other | Admitting: Anesthesiology

## 2019-03-11 ENCOUNTER — Ambulatory Visit (HOSPITAL_COMMUNITY): Payer: Medicare Other | Admitting: Physician Assistant

## 2019-03-11 ENCOUNTER — Encounter (HOSPITAL_COMMUNITY)
Admission: RE | Disposition: A | Discharge status: Home Health | Payer: Self-pay | Source: Home / Self Care | Attending: Orthopedic Surgery

## 2019-03-11 DIAGNOSIS — Z96642 Presence of left artificial hip joint: Secondary | ICD-10-CM | POA: Diagnosis not present

## 2019-03-11 DIAGNOSIS — Z96651 Presence of right artificial knee joint: Secondary | ICD-10-CM | POA: Diagnosis not present

## 2019-03-11 DIAGNOSIS — F329 Major depressive disorder, single episode, unspecified: Secondary | ICD-10-CM | POA: Diagnosis not present

## 2019-03-11 DIAGNOSIS — Z888 Allergy status to other drugs, medicaments and biological substances status: Secondary | ICD-10-CM | POA: Insufficient documentation

## 2019-03-11 DIAGNOSIS — K219 Gastro-esophageal reflux disease without esophagitis: Secondary | ICD-10-CM | POA: Diagnosis not present

## 2019-03-11 DIAGNOSIS — Z7982 Long term (current) use of aspirin: Secondary | ICD-10-CM | POA: Insufficient documentation

## 2019-03-11 DIAGNOSIS — E1149 Type 2 diabetes mellitus with other diabetic neurological complication: Secondary | ICD-10-CM | POA: Insufficient documentation

## 2019-03-11 DIAGNOSIS — M797 Fibromyalgia: Secondary | ICD-10-CM | POA: Diagnosis not present

## 2019-03-11 DIAGNOSIS — Z79899 Other long term (current) drug therapy: Secondary | ICD-10-CM | POA: Insufficient documentation

## 2019-03-11 DIAGNOSIS — M1711 Unilateral primary osteoarthritis, right knee: Principal | ICD-10-CM | POA: Diagnosis present

## 2019-03-11 DIAGNOSIS — F419 Anxiety disorder, unspecified: Secondary | ICD-10-CM | POA: Diagnosis not present

## 2019-03-11 DIAGNOSIS — I10 Essential (primary) hypertension: Secondary | ICD-10-CM | POA: Diagnosis not present

## 2019-03-11 DIAGNOSIS — Z7983 Long term (current) use of bisphosphonates: Secondary | ICD-10-CM | POA: Diagnosis not present

## 2019-03-11 DIAGNOSIS — Z791 Long term (current) use of non-steroidal anti-inflammatories (NSAID): Secondary | ICD-10-CM | POA: Insufficient documentation

## 2019-03-11 DIAGNOSIS — K253 Acute gastric ulcer without hemorrhage or perforation: Secondary | ICD-10-CM | POA: Diagnosis not present

## 2019-03-11 DIAGNOSIS — Z882 Allergy status to sulfonamides status: Secondary | ICD-10-CM | POA: Diagnosis not present

## 2019-03-11 DIAGNOSIS — M1612 Unilateral primary osteoarthritis, left hip: Secondary | ICD-10-CM | POA: Diagnosis not present

## 2019-03-11 HISTORY — PX: TOTAL KNEE ARTHROPLASTY: SHX125

## 2019-03-11 LAB — TYPE AND SCREEN
ABO/RH(D): O POS
Antibody Screen: NEGATIVE

## 2019-03-11 LAB — GLUCOSE, CAPILLARY
Glucose-Capillary: 103 mg/dL — ABNORMAL HIGH (ref 70–99)
Glucose-Capillary: 93 mg/dL (ref 70–99)

## 2019-03-11 SURGERY — ARTHROPLASTY, KNEE, TOTAL
Anesthesia: Spinal | Site: Knee | Laterality: Right

## 2019-03-11 MED ORDER — LACTATED RINGERS IV SOLN
INTRAVENOUS | Status: DC
  Administered 2019-03-11 (×2): via INTRAVENOUS

## 2019-03-11 MED ORDER — CHLORHEXIDINE GLUCONATE 4 % EX LIQD: 60.0000 mL | Freq: Once | CUTANEOUS | Status: DC

## 2019-03-11 MED ORDER — BUPROPION HCL ER (XL) 150 MG PO TB24
150.0000 mg | ORAL_TABLET | Freq: Every day | ORAL | Status: DC
  Administered 2019-03-12: 150 mg via ORAL
  Filled 2019-03-11 (×2): qty 1

## 2019-03-11 MED ORDER — DOCUSATE SODIUM 100 MG PO CAPS
100.0000 mg | ORAL_CAPSULE | Freq: Two times a day (BID) | ORAL | Status: DC
  Administered 2019-03-11 – 2019-03-12 (×2): 100 mg via ORAL
  Filled 2019-03-11 (×2): qty 1

## 2019-03-11 MED ORDER — ASPIRIN EC 325 MG PO TBEC
325.0000 mg | DELAYED_RELEASE_TABLET | Freq: Two times a day (BID) | ORAL | Status: DC
  Administered 2019-03-12: 325 mg via ORAL
  Filled 2019-03-11: qty 1

## 2019-03-11 MED ORDER — MIDAZOLAM HCL 2 MG/2ML IJ SOLN
1.0000 mg | INTRAMUSCULAR | Status: DC
  Administered 2019-03-11: 1 mg via INTRAVENOUS
  Filled 2019-03-11: qty 2

## 2019-03-11 MED ORDER — FENTANYL CITRATE (PF) 100 MCG/2ML IJ SOLN
INTRAMUSCULAR | Status: AC
  Filled 2019-03-11: qty 2

## 2019-03-11 MED ORDER — WATER FOR IRRIGATION, STERILE IR SOLN
Status: DC | PRN
  Administered 2019-03-11: 2000 mL

## 2019-03-11 MED ORDER — SODIUM CHLORIDE 0.9 % IV SOLN
INTRAVENOUS | Status: DC
  Administered 2019-03-11: 100 mL/h via INTRAVENOUS

## 2019-03-11 MED ORDER — ALPRAZOLAM 0.5 MG PO TABS: 0.5000 mg | ORAL_TABLET | Freq: Three times a day (TID) | ORAL | Status: DC | PRN

## 2019-03-11 MED ORDER — DOCUSATE SODIUM 100 MG PO CAPS: 100.0000 mg | ORAL_CAPSULE | Freq: Two times a day (BID) | ORAL | 0 refills | Status: DC

## 2019-03-11 MED ORDER — DIPHENHYDRAMINE HCL 12.5 MG/5ML PO ELIX: 12.5000 mg | ORAL_SOLUTION | ORAL | Status: DC | PRN

## 2019-03-11 MED ORDER — CEFAZOLIN SODIUM-DEXTROSE 2-4 GM/100ML-% IV SOLN
2.0000 g | Freq: Four times a day (QID) | INTRAVENOUS | Status: AC
  Administered 2019-03-11 (×2): 2 g via INTRAVENOUS
  Filled 2019-03-11 (×2): qty 100

## 2019-03-11 MED ORDER — MIDAZOLAM HCL 2 MG/2ML IJ SOLN
INTRAMUSCULAR | Status: AC
  Filled 2019-03-11: qty 2

## 2019-03-11 MED ORDER — POLYETHYLENE GLYCOL 3350 17 G PO PACK: 17.0000 g | PACK | Freq: Every day | ORAL | Status: DC | PRN

## 2019-03-11 MED ORDER — SODIUM CHLORIDE 0.9 % IV SOLN
INTRAVENOUS | Status: DC | PRN
  Administered 2019-03-11: 30 ug/min via INTRAVENOUS

## 2019-03-11 MED ORDER — ROPIVACAINE HCL 7.5 MG/ML IJ SOLN
INTRAMUSCULAR | Status: DC | PRN
  Administered 2019-03-11: 20 mL via PERINEURAL

## 2019-03-11 MED ORDER — ONDANSETRON HCL 4 MG/2ML IJ SOLN: 4.0000 mg | Freq: Four times a day (QID) | INTRAMUSCULAR | Status: DC | PRN

## 2019-03-11 MED ORDER — DEXAMETHASONE SODIUM PHOSPHATE 10 MG/ML IJ SOLN
INTRAMUSCULAR | Status: DC | PRN
  Administered 2019-03-11: 10 mg via INTRAVENOUS

## 2019-03-11 MED ORDER — ONDANSETRON HCL 4 MG/2ML IJ SOLN: 4.0000 mg | Freq: Once | INTRAMUSCULAR | Status: DC | PRN

## 2019-03-11 MED ORDER — PROPOFOL 10 MG/ML IV BOLUS
INTRAVENOUS | Status: AC
  Filled 2019-03-11: qty 60

## 2019-03-11 MED ORDER — METHOCARBAMOL 500 MG IVPB - SIMPLE MED
500.0000 mg | Freq: Four times a day (QID) | INTRAVENOUS | Status: DC | PRN
  Filled 2019-03-11: qty 50

## 2019-03-11 MED ORDER — ONDANSETRON HCL 4 MG/2ML IJ SOLN
INTRAMUSCULAR | Status: DC | PRN
  Administered 2019-03-11: 4 mg via INTRAVENOUS

## 2019-03-11 MED ORDER — TRANEXAMIC ACID-NACL 1000-0.7 MG/100ML-% IV SOLN
1000.0000 mg | Freq: Once | INTRAVENOUS | Status: AC
  Administered 2019-03-11: 1000 mg via INTRAVENOUS
  Filled 2019-03-11: qty 100

## 2019-03-11 MED ORDER — SODIUM CHLORIDE (PF) 0.9 % IJ SOLN
INTRAMUSCULAR | Status: AC
  Filled 2019-03-11: qty 50

## 2019-03-11 MED ORDER — METHOCARBAMOL 500 MG PO TABS
500.0000 mg | ORAL_TABLET | Freq: Four times a day (QID) | ORAL | Status: DC | PRN
  Administered 2019-03-11 – 2019-03-12 (×2): 500 mg via ORAL
  Filled 2019-03-11 (×2): qty 1

## 2019-03-11 MED ORDER — ALUM & MAG HYDROXIDE-SIMETH 200-200-20 MG/5ML PO SUSP: 30.0000 mL | ORAL | Status: DC | PRN

## 2019-03-11 MED ORDER — MAGNESIUM CITRATE PO SOLN: 1.0000 | Freq: Once | ORAL | Status: DC | PRN

## 2019-03-11 MED ORDER — BISACODYL 5 MG PO TBEC: 5.0000 mg | DELAYED_RELEASE_TABLET | Freq: Every day | ORAL | Status: DC | PRN

## 2019-03-11 MED ORDER — TRANEXAMIC ACID-NACL 1000-0.7 MG/100ML-% IV SOLN
1000.0000 mg | INTRAVENOUS | Status: AC
  Administered 2019-03-11: 1000 mg via INTRAVENOUS
  Filled 2019-03-11: qty 100

## 2019-03-11 MED ORDER — HYDROMORPHONE HCL 1 MG/ML IJ SOLN: 0.2500 mg | INTRAMUSCULAR | Status: DC | PRN

## 2019-03-11 MED ORDER — ONDANSETRON HCL 4 MG PO TABS: 4.0000 mg | ORAL_TABLET | Freq: Four times a day (QID) | ORAL | Status: DC | PRN

## 2019-03-11 MED ORDER — ONDANSETRON HCL 4 MG/2ML IJ SOLN
INTRAMUSCULAR | Status: AC
  Filled 2019-03-11: qty 2

## 2019-03-11 MED ORDER — DULOXETINE HCL 60 MG PO CPEP
120.0000 mg | ORAL_CAPSULE | Freq: Every day | ORAL | Status: DC
  Administered 2019-03-12: 120 mg via ORAL
  Filled 2019-03-11 (×2): qty 2

## 2019-03-11 MED ORDER — HYDROMORPHONE HCL 1 MG/ML IJ SOLN: 0.5000 mg | INTRAMUSCULAR | Status: DC | PRN

## 2019-03-11 MED ORDER — CEFAZOLIN SODIUM-DEXTROSE 2-4 GM/100ML-% IV SOLN
2.0000 g | INTRAVENOUS | Status: AC
  Administered 2019-03-11: 2 g via INTRAVENOUS
  Filled 2019-03-11: qty 100

## 2019-03-11 MED ORDER — FENTANYL CITRATE (PF) 100 MCG/2ML IJ SOLN
INTRAMUSCULAR | Status: DC | PRN
  Administered 2019-03-11: 50 ug via INTRAVENOUS

## 2019-03-11 MED ORDER — ASPIRIN EC 325 MG PO TBEC: 325.0000 mg | DELAYED_RELEASE_TABLET | Freq: Two times a day (BID) | ORAL | 0 refills | Status: DC

## 2019-03-11 MED ORDER — POVIDONE-IODINE 10 % EX SWAB
2.0000 "application " | Freq: Once | CUTANEOUS | Status: AC
  Administered 2019-03-11: 2 via TOPICAL

## 2019-03-11 MED ORDER — BUPIVACAINE-EPINEPHRINE 0.25% -1:200000 IJ SOLN
INTRAMUSCULAR | Status: DC | PRN
  Administered 2019-03-11: 30 mL

## 2019-03-11 MED ORDER — MEPERIDINE HCL 50 MG/ML IJ SOLN: 6.2500 mg | INTRAMUSCULAR | Status: DC | PRN

## 2019-03-11 MED ORDER — OXYCODONE HCL 5 MG PO TABS
5.0000 mg | ORAL_TABLET | ORAL | Status: DC | PRN
  Administered 2019-03-11 – 2019-03-12 (×2): 5 mg via ORAL
  Filled 2019-03-11: qty 2
  Filled 2019-03-11: qty 1

## 2019-03-11 MED ORDER — ACETAMINOPHEN 325 MG PO TABS: 325.0000 mg | ORAL_TABLET | Freq: Four times a day (QID) | ORAL | Status: DC | PRN

## 2019-03-11 MED ORDER — PROPOFOL 10 MG/ML IV BOLUS
INTRAVENOUS | Status: DC | PRN
  Administered 2019-03-11: 20 mg via INTRAVENOUS

## 2019-03-11 MED ORDER — METHYLPHENIDATE HCL 10 MG PO TABS
20.0000 mg | ORAL_TABLET | Freq: Three times a day (TID) | ORAL | Status: DC
  Administered 2019-03-12 (×2): 20 mg via ORAL
  Filled 2019-03-11 (×3): qty 2

## 2019-03-11 MED ORDER — SODIUM CHLORIDE 0.9 % IR SOLN
Status: DC | PRN
  Administered 2019-03-11 (×2): 1000 mL

## 2019-03-11 MED ORDER — BUPIVACAINE LIPOSOME 1.3 % IJ SUSP
INTRAMUSCULAR | Status: DC | PRN
  Administered 2019-03-11: 20 mL

## 2019-03-11 MED ORDER — DEXAMETHASONE SODIUM PHOSPHATE 10 MG/ML IJ SOLN
10.0000 mg | Freq: Two times a day (BID) | INTRAMUSCULAR | Status: DC
  Administered 2019-03-11: 10 mg via INTRAVENOUS
  Filled 2019-03-11 (×2): qty 1

## 2019-03-11 MED ORDER — OXYCODONE-ACETAMINOPHEN 5-325 MG PO TABS: 1.0000 | ORAL_TABLET | Freq: Four times a day (QID) | ORAL | 0 refills | Status: DC | PRN

## 2019-03-11 MED ORDER — BUPIVACAINE-EPINEPHRINE (PF) 0.25% -1:200000 IJ SOLN
INTRAMUSCULAR | Status: AC
  Filled 2019-03-11: qty 30

## 2019-03-11 MED ORDER — PROPOFOL 500 MG/50ML IV EMUL
INTRAVENOUS | Status: DC | PRN
  Administered 2019-03-11: 75 ug/kg/min via INTRAVENOUS

## 2019-03-11 MED ORDER — GABAPENTIN 300 MG PO CAPS
300.0000 mg | ORAL_CAPSULE | Freq: Two times a day (BID) | ORAL | Status: DC
  Administered 2019-03-11 – 2019-03-12 (×2): 300 mg via ORAL
  Filled 2019-03-11 (×2): qty 1

## 2019-03-11 MED ORDER — DEXAMETHASONE SODIUM PHOSPHATE 10 MG/ML IJ SOLN
INTRAMUSCULAR | Status: AC
  Filled 2019-03-11: qty 1

## 2019-03-11 MED ORDER — PANTOPRAZOLE SODIUM 40 MG PO TBEC
40.0000 mg | DELAYED_RELEASE_TABLET | Freq: Every day | ORAL | Status: DC
  Administered 2019-03-12: 40 mg via ORAL
  Filled 2019-03-11: qty 1

## 2019-03-11 MED ORDER — FENTANYL CITRATE (PF) 100 MCG/2ML IJ SOLN
50.0000 ug | INTRAMUSCULAR | Status: DC
  Administered 2019-03-11: 50 ug via INTRAVENOUS
  Filled 2019-03-11: qty 2

## 2019-03-11 SURGICAL SUPPLY — 57 items
ATTUNE PSFEM RTSZ4 NARCEM KNEE (Femur) ×3 IMPLANT
ATTUNE PSRP INSR SZ4 5 KNEE (Insert) ×2 IMPLANT
ATTUNE PSRP INSR SZ4 5MM KNEE (Insert) ×1 IMPLANT
BAG ZIPLOCK 12X15 (MISCELLANEOUS) ×3 IMPLANT
BANDAGE ACE 6X5 VEL STRL LF (GAUZE/BANDAGES/DRESSINGS) ×3 IMPLANT
BASE TIBIAL ROT PLAT SZ 3 KNEE (Knees) ×1 IMPLANT
BENZOIN TINCTURE PRP APPL 2/3 (GAUZE/BANDAGES/DRESSINGS) ×3 IMPLANT
BLADE SAGITTAL 25.0X1.19X90 (BLADE) ×2 IMPLANT
BLADE SAGITTAL 25.0X1.19X90MM (BLADE) ×1
BLADE SAW SGTL 11.0X1.19X90.0M (BLADE) ×3 IMPLANT
BLADE SURG SZ10 CARB STEEL (BLADE) ×6 IMPLANT
BNDG ELASTIC 6X15 VLCR STRL LF (GAUZE/BANDAGES/DRESSINGS) ×3 IMPLANT
BOOTIES KNEE HIGH SLOAN (MISCELLANEOUS) ×3 IMPLANT
BOWL SMART MIX CTS (DISPOSABLE) ×3 IMPLANT
CEMENT HV SMART SET (Cement) ×6 IMPLANT
CLOSURE STERI-STRIP 1/2X4 (GAUZE/BANDAGES/DRESSINGS) ×1
CLOSURE WOUND 1/2 X4 (GAUZE/BANDAGES/DRESSINGS) ×1
CLSR STERI-STRIP ANTIMIC 1/2X4 (GAUZE/BANDAGES/DRESSINGS) ×2 IMPLANT
COVER SURGICAL LIGHT HANDLE (MISCELLANEOUS) ×3 IMPLANT
COVER WAND RF STERILE (DRAPES) IMPLANT
CUFF TOURN SGL QUICK 34 (TOURNIQUET CUFF) ×2
CUFF TRNQT CYL 34X4.125X (TOURNIQUET CUFF) ×1 IMPLANT
DECANTER SPIKE VIAL GLASS SM (MISCELLANEOUS) ×3 IMPLANT
DRAPE U-SHAPE 47X51 STRL (DRAPES) ×3 IMPLANT
DRSG AQUACEL AG ADV 3.5X10 (GAUZE/BANDAGES/DRESSINGS) ×3 IMPLANT
DURAPREP 26ML APPLICATOR (WOUND CARE) ×3 IMPLANT
ELECT REM PT RETURN 15FT ADLT (MISCELLANEOUS) ×3 IMPLANT
GLOVE BIOGEL PI IND STRL 8 (GLOVE) ×2 IMPLANT
GLOVE BIOGEL PI INDICATOR 8 (GLOVE) ×4
GLOVE ECLIPSE 7.5 STRL STRAW (GLOVE) ×6 IMPLANT
GOWN STRL REUS W/TWL XL LVL3 (GOWN DISPOSABLE) ×6 IMPLANT
HANDPIECE INTERPULSE COAX TIP (DISPOSABLE) ×2
HOLDER FOLEY CATH W/STRAP (MISCELLANEOUS) ×3 IMPLANT
HOOD PEEL AWAY FLYTE STAYCOOL (MISCELLANEOUS) ×9 IMPLANT
KIT TURNOVER KIT A (KITS) IMPLANT
MANIFOLD NEPTUNE II (INSTRUMENTS) ×3 IMPLANT
NEEDLE HYPO 22GX1.5 SAFETY (NEEDLE) ×3 IMPLANT
NS IRRIG 1000ML POUR BTL (IV SOLUTION) ×3 IMPLANT
PACK ICE MAXI GEL EZY WRAP (MISCELLANEOUS) ×3 IMPLANT
PACK TOTAL KNEE CUSTOM (KITS) ×3 IMPLANT
PADDING CAST COTTON 6X4 STRL (CAST SUPPLIES) ×3 IMPLANT
PATELLA MEDIAL ATTUN 35MM KNEE (Knees) ×3 IMPLANT
PIN STEINMAN FIXATION KNEE (PIN) ×3 IMPLANT
PIN THREADED HEADED SIGMA (PIN) ×3 IMPLANT
PROTECTOR NERVE ULNAR (MISCELLANEOUS) ×3 IMPLANT
SET HNDPC FAN SPRY TIP SCT (DISPOSABLE) ×1 IMPLANT
STRIP CLOSURE SKIN 1/2X4 (GAUZE/BANDAGES/DRESSINGS) ×2 IMPLANT
SUT MNCRL AB 3-0 PS2 18 (SUTURE) ×3 IMPLANT
SUT VIC AB 0 CT1 36 (SUTURE) ×3 IMPLANT
SUT VIC AB 1 CT1 36 (SUTURE) ×6 IMPLANT
SYR CONTROL 10ML LL (SYRINGE) ×6 IMPLANT
TIBIAL BASE ROT PLAT SZ 3 KNEE (Knees) ×3 IMPLANT
TRAY FOLEY CATH 14FR (SET/KITS/TRAYS/PACK) ×3 IMPLANT
WATER STERILE IRR 1000ML POUR (IV SOLUTION) ×6 IMPLANT
YANKAUER SUCT BULB TIP 10FT TU (MISCELLANEOUS) ×6 IMPLANT
sterile smooth pin ×3 IMPLANT
sterile threaded head pin ×2 IMPLANT

## 2019-03-11 NOTE — Evaluation (Signed)
Physical Therapy Evaluation Patient Details Name: Lisa Odom MRN: [id number] DOB: 03-19-1956 Today's Date: 03/11/2019   History of Present Illness  63 yo female s/p R TKR on 03/11/19. PMH includes OA, LBP, fibromyalgia, OP, ACDF, HTN, depression, peripheral neuropathy, R RTC repair, L THA 10/2017.  Clinical Impression   Pt presents with R knee pain, decreased R knee ROM, increased time and effort to perform mobility tasks, and decreased activity tolerance following surgery. Pt to benefit from acute PT to address deficits. Pt ambulated short hallway distance with RW with min guard assist, verbal cuing for form and safety provided. Pt educated on ankle pumps (20/hour) to perform this afternoon/evening to increase circulation, to pt's tolerance and limited by pain. PT to progress mobility as tolerated, and will continue to follow acutely.        Follow Up Recommendations Follow surgeon's recommendation for DC plan and follow-up therapies;Supervision for mobility/OOB(HHPT)    Equipment Recommendations  None recommended by PT    Recommendations for Other Services       Precautions / Restrictions Precautions Precautions: Fall Restrictions Weight Bearing Restrictions: No Other Position/Activity Restrictions: WBAT      Mobility  Bed Mobility Overal bed mobility: Needs Assistance Bed Mobility: Supine to Sit     Supine to sit: Min guard;HOB elevated     General bed mobility comments: Min gaurd for safety, close guard of RLE. Pt with increased time and effort to come to EOB.  Transfers Overall transfer level: Needs assistance Equipment used: Rolling walker (2 wheeled) Transfers: Sit to/from Stand Sit to Stand: Min guard;From elevated surface         General transfer comment: Min guard for safety, verbal cuing for hand placement when rising.  Ambulation/Gait Ambulation/Gait assistance: Min guard Gait Distance (Feet): 30 Feet Assistive device: Rolling walker (2  wheeled) Gait Pattern/deviations: Step-to pattern;Decreased step length - left;Decreased step length - right;Narrow base of support Gait velocity: decr   General Gait Details: Min guard for safety, verbal cuing for wider BOS, sequencing, placement in and turning with RW. No R knee instability noted.  Stairs            Wheelchair Mobility    Modified Rankin (Stroke Patients Only)       Balance Overall balance assessment: Mild deficits observed, not formally tested                                           Pertinent Vitals/Pain Pain Assessment: 0-10 Pain Score: 2  Pain Location: R knee Pain Descriptors / Indicators: Sore Pain Intervention(s): Limited activity within patient's tolerance;Premedicated before session;Monitored during session;Repositioned;Ice applied    Home Living Family/patient expects to be discharged to:: Private residence Living Arrangements: Other relatives(lives with sister) Available Help at Discharge: Family Type of Home: House Home Access: Stairs to enter Entrance Stairs-Rails: Right;Left;Can reach both Entrance Stairs-Number of Steps: 5 Home Layout: One level Home Equipment: [place] - 2 wheels;Bedside commode;Cane - single point      Prior Function Level of Independence: Independent with assistive device(s)         Comments: Pt reports not regularly using AD for ambulation PTA, occasionally used cane     Hand Dominance   Dominant Hand: Right    Extremity/Trunk Assessment   Upper Extremity Assessment Upper Extremity Assessment: Overall WFL for tasks assessed    Lower Extremity Assessment Lower Extremity Assessment: Overall  WFL for tasks assessed;RLE deficits/detail RLE Deficits / Details: suspected post-surgical weakness; able to perform ankle pumps, quad set, heel slide to 75*, SLR without lift assist or quad lag RLE Sensation: WNL;decreased light touch(decreased light touch parts of R foot, improved with WB)     Cervical / Trunk Assessment Cervical / Trunk Assessment: Normal  Communication   Communication: No difficulties  Cognition Arousal/Alertness: Awake/alert Behavior During Therapy: WFL for tasks assessed/performed Overall Cognitive Status: Within Functional Limits for tasks assessed                                        General Comments      Exercises     Assessment/Plan    PT Assessment Patient needs continued PT services  PT Problem List Decreased strength;Decreased mobility;Decreased range of motion;Decreased activity tolerance;Decreased balance;Decreased knowledge of use of DME;Pain       PT Treatment Interventions DME instruction;Therapeutic activities;Gait training;Therapeutic exercise;Patient/family education;Balance training;Stair training;Functional mobility training    PT Goals (Current goals can be found in the Care Plan section)  Acute Rehab PT Goals Patient Stated Goal: move better PT Goal Formulation: With patient Time For Goal Achievement: 03/18/19 Potential to Achieve Goals: Good    Frequency 7X/week   Barriers to discharge        Co-evaluation               AM-PAC PT "6 Clicks" Mobility  Outcome Measure Help needed turning from your back to your side while in a flat bed without using bedrails?: A Little Help needed moving from lying on your back to sitting on the side of a flat bed without using bedrails?: A Little Help needed moving to and from a bed to a chair (including a wheelchair)?: A Little Help needed standing up from a chair using your arms (e.g., wheelchair or bedside chair)?: A Little Help needed to walk in hospital room?: A Little Help needed climbing 3-5 steps with a railing? : A Lot 6 Click Score: 17    End of Session Equipment Utilized During Treatment: Gait belt Activity Tolerance: Patient tolerated treatment well Patient left: in chair;with call bell/phone within reach(pt verbally agrees not to mobilize  without pressing call button and waiting for assist; Pt on SCD break for skin integrity) Nurse Communication: Mobility status PT Visit Diagnosis: Other abnormalities of gait and mobility (R26.89);Difficulty in walking, not elsewhere classified (R26.2)    Time: 6808-8110 PT Time Calculation (min) (ACUTE ONLY): 26 min   Charges:   PT Evaluation $PT Eval Low Complexity: 1 Low PT Treatments $Gait Training: 8-22 mins       Julien Girt, PT Acute Rehabilitation Services Pager [phone]  Office [phone]  Roxine Caddy D Elonda Husky 03/11/2019, 6:12 PM

## 2019-03-11 NOTE — Anesthesia Postprocedure Evaluation (Signed)
Anesthesia Post Note  Patient: Lisa Odom  Procedure(s) Performed: TOTAL KNEE ARTHROPLASTY (Right Knee)     Patient location during evaluation: PACU Anesthesia Type: Spinal Level of consciousness: oriented and awake and alert Pain management: pain level controlled Vital Signs Assessment: post-procedure vital signs reviewed and stable Respiratory status: spontaneous breathing, respiratory function stable and patient connected to nasal cannula oxygen Cardiovascular status: blood pressure returned to baseline and stable Postop Assessment: no headache, no backache and no apparent nausea or vomiting Anesthetic complications: no    Last Vitals:  Vitals:   03/11/19 1230 03/11/19 1245  BP: (!) 130/113 (!) 141/89  Pulse: 66 61  Resp: 10 14  Temp:    SpO2: 99% 100%    Last Pain:  Vitals:   03/11/19 1230  TempSrc:   PainSc: 0-No pain                 Gwendy Boeder DAVID

## 2019-03-11 NOTE — Progress Notes (Signed)
Assisted Dr. Lillia Abed with Right Knee Adductor Canal block. Side rails up, monitors on throughout procedure. See vital signs in flow sheet. Tolerated Procedure well.

## 2019-03-11 NOTE — Anesthesia Procedure Notes (Addendum)
Anesthesia Regional Block: Adductor canal block   Pre-Anesthetic Checklist: ,, timeout performed, Correct Patient, Correct Site, Correct Laterality, Correct Procedure, Correct Position, site marked, Risks and benefits discussed,  Surgical consent,  Pre-op evaluation,  At surgeon's request and post-op pain management  Laterality: Right  Prep: chloraprep       Needles:  Injection technique: Single-shot  Needle Type: Echogenic Stimulator Needle     Needle Length: 9cm  Needle Gauge: 21     Additional Needles:   Narrative:  Start time: 03/11/2019 9:17 AM End time: 03/11/2019 9:27 AM Injection made incrementally with aspirations every 5 mL.  Performed by: Personally  Anesthesiologist: Lillia Abed, MD  Additional Notes: Monitors applied. Patient sedated. Sterile prep and drape,hand hygiene and sterile gloves were used. Relevant anatomy identified.Needle position confirmed.Local anesthetic injected incrementally after negative aspiration. Local anesthetic spread visualized around nerve(s). Vascular puncture avoided. No complications. Image printed for medical record.The patient tolerated the procedure well.    Lillia Abed MD

## 2019-03-11 NOTE — Transfer of Care (Addendum)
Immediate Anesthesia Transfer of Care Note  Patient: Lisa Odom  Procedure(s) Performed: TOTAL KNEE ARTHROPLASTY (Right Knee)  Patient Location: PACU  Anesthesia Type:Spinal  Level of Consciousness: awake, alert , oriented and patient cooperative  Airway & Oxygen Therapy: Patient Spontanous Breathing and Patient connected to face mask oxygen  Post-op Assessment: Report given to RN and Post -op Vital signs reviewed and stable  Post vital signs: stable  Last Vitals:  Vitals Value Taken Time  BP 141/89 03/11/19 1245  Temp 36.3 C 03/11/19 1158  Pulse 62 03/11/19 1247  Resp 12 03/11/19 1247  SpO2 100 % 03/11/19 1247  Vitals shown include unvalidated device data.  Last Pain:  Vitals:   03/11/19 1230  TempSrc:   PainSc: 0-No pain      Patients Stated Pain Goal: 4 (07/26/89 2284)  Complications: No apparent anesthesia complications

## 2019-03-11 NOTE — Brief Op Note (Signed)
03/11/2019  1:41 PM  PATIENT:  Lisa Odom  63 y.o. female  PRE-OPERATIVE DIAGNOSIS:  DEGENERATIVE JOINT DISEASE RIGHT KNEE  POST-OPERATIVE DIAGNOSIS:  DEGENERATIVE JOINT DISEASE RIGHT KNEE  PROCEDURE:  Procedure(s): TOTAL KNEE ARTHROPLASTY (Right)  SURGEON:  Surgeon(s) and Role:    Dorna Leitz, MD - Primary  PHYSICIAN ASSISTANT:   ASSISTANTS: jim bethune   ANESTHESIA:   spinal  EBL:  50 mL   BLOOD ADMINISTERED:none  DRAINS: none   LOCAL MEDICATIONS USED:  MARCAINE    and OTHER experel  SPECIMEN:  No Specimen  DISPOSITION OF SPECIMEN:  N/A  COUNTS:  YES  TOURNIQUET:   Total Tourniquet Time Documented: Thigh (Right) - 30276 minutes Total: Thigh (Right) - 30276 minutes   DICTATION: .Other Dictation: Dictation Number [id number]  PLAN OF CARE: Admit for overnight observation  PATIENT DISPOSITION:  PACU - hemodynamically stable.   Delay start of Pharmacological VTE agent (>24hrs) due to surgical blood loss or risk of bleeding: no

## 2019-03-11 NOTE — Anesthesia Procedure Notes (Signed)
Procedure Name: MAC Date/Time: 03/11/2019 10:01 AM Performed by: Lissa Morales, CRNA Pre-anesthesia Checklist: Patient identified, Emergency Drugs available, Suction available, Patient being monitored and Timeout performed Patient Re-evaluated:Patient Re-evaluated prior to induction Oxygen Delivery Method: Simple face mask Placement Confirmation: positive ETCO2

## 2019-03-11 NOTE — H&P (Signed)
TOTAL KNEE ADMISSION H&P  Patient is being admitted for right total knee arthroplasty.  Subjective:  Chief Complaint:right knee pain.  HPI: Lisa Odom, 63 y.o. female, has a history of pain and functional disability in the right knee due to arthritis and has failed non-surgical conservative treatments for greater than 12 weeks to includeNSAID's and/or analgesics, corticosteriod injections, viscosupplementation injections, weight reduction as appropriate and activity modification.  Onset of symptoms was gradual, starting 5 years ago with gradually worsening course since that time. The patient noted no past surgery on the right knee(s).  Patient currently rates pain in the right knee(s) at 9 out of 10 with activity. Patient has night pain, worsening of pain with activity and weight bearing, pain that interferes with activities of daily living, pain with passive range of motion and joint swelling.  Patient has evidence of subchondral cysts, periarticular osteophytes, joint subluxation and joint space narrowing by imaging studies. This patient has had failure of conservative care. There is no active infection.  Patient Active Problem List   Diagnosis Date Noted  . Pre-op evaluation 11/23/2018  . Primary osteoarthritis of left hip 11/20/2017  . Intrapelvic protrusion of left acetabulum 11/20/2017  . Left arm pain 01/09/2017  . Bilateral bunions 06/13/2016  . Bilateral low back pain with left-sided sciatica 06/13/2016  . Confusion 04/30/2016  . Balance problem 04/10/2016  . Left hip pain 12/20/2015  . Memory loss 12/20/2015  . Osteoporosis 05/29/2015  . Fibromyalgia 05/22/2015  . Spinal stenosis of cervical region 09/12/2014  . Stenosis of cervical spine with myelopathy ([place]) 09/12/2014  . Cervical (neck) region somatic dysfunction   . Elevated CK 08/17/2014  . Chronic neck pain 08/17/2014  . Bilateral anterior knee pain 11/04/2013  . Controlled type 2 diabetes mellitus with  neurological manifestations ([place]) 08/04/2013  . Bilateral foot pain 02/22/2013  . Bilateral leg pain, nighttime 11/30/2012  . Carpal tunnel syndrome of left wrist 11/30/2012  . Panic attack 02/16/2012  . GERD (gastroesophageal reflux disease) 09/05/2011  . ESSENTIAL HYPERTENSION, BENIGN 04/27/2009  . HYPERCHOLESTEROLEMIA 12/19/2008  . Major depressive disorder, recurrent episode, severe ([place]) 12/19/2008  . MIGRAINE, COMMON 12/19/2008  . ALLERGIC RHINITIS 12/19/2008  . GASTRIC ULCER, ACUTE 12/19/2008  . DIVERTICULITIS, COLON 12/19/2008   Past Medical History:  Diagnosis Date  . Anxiety   . Arthritis   . Contact lens/glasses fitting    wears contacts or glasses  . Depression   . Diabetes mellitus without complication (HCC)    No meds  . Diverticulosis   . Family history of adverse reaction to anesthesia    pts sister has severe N/V  . Fibromyalgia   . Gastritis   . GERD (gastroesophageal reflux disease)   . Hemorrhoid   . Hiatal hernia    neuropathy  . Migraine   . Neuropathy   . Panic attacks   . Pneumonia    as an infant    Past Surgical History:  Procedure Laterality Date  . ANTERIOR CERVICAL DECOMP/DISCECTOMY FUSION N/A 09/12/2014   Procedure: Evacuation of cervical hematoma;  Surgeon: [name], MD;  Location: [place] [facility];  Service: Neurosurgery;  Laterality: N/A;  . ANTERIOR CERVICAL DECOMP/DISCECTOMY FUSION N/A 09/12/2014   Procedure: CERVICAL FOUR-FIVE, CERVICAL FIVE-SIX, CERVICAL SIX-SEVEN ANTERIOR CERVICAL DECOMPRESSION/DISCECTOMY FUSION;  Surgeon: [name], MD;  Location: [facility];  Service: Neurosurgery;  Laterality: N/A;  . BUNIONECTOMY    . CARPAL TUNNEL RELEASE  2002   right  . COLONOSCOPY    .  JOINT REPLACEMENT Left   . SHOULDER ARTHROSCOPY WITH ROTATOR CUFF REPAIR  2010   right  . TOTAL HIP ARTHROPLASTY Left 11/20/2017   Procedure: LEFT TOTAL HIP ARTHROPLASTY ANTERIOR APPROACH WITH BONE GRAFTING AND ACETABULAR SCREWS;  Surgeon: Dorna Leitz, MD;  Location: [facility];  Service: Orthopedics;  Laterality: Left;  . TRIGGER FINGER RELEASE Right 03/23/2013   Procedure: RELEASE TRIGGER FINGER/A-1 PULLEY RIGHT RING FINGER;  Surgeon: [name], MD;  Location: [facility];  Service: Orthopedics;  Laterality: Right;  . TUBAL LIGATION    . UPPER GI ENDOSCOPY      Current Facility-Administered Medications  Medication Dose Route Frequency Provider Last Rate Last Dose  . bupivacaine liposome (EXPAREL) 1.3 % injection 266 mg  20 mL Infiltration Once Dorna Leitz, MD       Current Outpatient Medications  Medication Sig Dispense Refill Last Dose  . alendronate (FOSAMAX) 70 MG tablet Take 1 tablet (70 mg total) by mouth every 7 (seven) days. Take with a full glass of water on an empty stomach. 12 tablet 3   . ALPRAZolam (XANAX) 0.5 MG tablet Take 0.5 mg by mouth 3 (three) times daily as needed for anxiety.      Marland Kitchen aspirin EC 81 MG tablet Take 81 mg by mouth daily.     Marland Kitchen atorvastatin (LIPITOR) 40 MG tablet Take 1 tablet (40 mg total) by mouth daily. 90 tablet 3   . b complex vitamins capsule Take 1 capsule by mouth every 3 (three) days.      . Black Pepper-Turmeric (TURMERIC COMPLEX/BLACK PEPPER PO) Take 1 capsule by mouth daily.      Marland Kitchen buPROPion (WELLBUTRIN XL) 150 MG 24 hr tablet Take 1 tablet (150 mg total) by mouth daily. 90 tablet 3   . CALCIUM-VITAMIN D PO Take 600 mg by mouth 2 (two) times a day.      . DULoxetine (CYMBALTA) 60 MG capsule TAKE 2 CAPSULES (120 MG TOTAL) BY MOUTH DAILY. (Patient taking differently: Take 120 mg by mouth daily. TAKE 2 CAPSULES (120 MG TOTAL) BY MOUTH DAILY.) 180 capsule 3   . fexofenadine (ALLEGRA ALLERGY) 180 MG tablet Take 1 tablet (180 mg total) by mouth daily. (Patient taking differently: Take 180 mg by mouth daily as needed for allergies. )     . gabapentin (NEURONTIN) 600 MG tablet Take 0.5 tablets (300 mg total) by mouth See admin instructions. Take 300 mg by mouth in the morning and after  lunch and 1200 mg at bedtime (Patient taking differently: Take 300-1,200 mg by mouth See admin instructions. Take 300 mg by mouth in the morning and after lunch and 1200 mg at bedtime) 270 tablet 3   . meloxicam (MOBIC) 15 MG tablet TAKE 1 TABLET BY MOUTH AT  BEDTIME (Patient taking differently: Take 15 mg by mouth at bedtime. ) 60 tablet 3   . methylphenidate (RITALIN) 20 MG tablet Take 20 mg by mouth 3 (three) times daily with meals.     . Multiple Vitamin (MULTIVITAMIN) tablet Take 1 tablet by mouth daily.     . Omega-3 Fatty Acids (FISH OIL) 1200 MG CAPS Take 1,200 mg by mouth daily.      . pantoprazole (PROTONIX) 40 MG tablet Take 1 tablet (40 mg total) by mouth daily. 90 tablet 3   . tiZANidine (ZANAFLEX) 4 MG tablet Take 0.5 tablets (2 mg total) by mouth every 8 (eight) hours as needed for muscle spasms. (Patient taking differently: Take 4 mg  by mouth every 8 (eight) hours as needed for muscle spasms. ) 40 tablet 0   . ACCU-CHEK SOFTCLIX LANCETS lancets Use to check blood sugar three times a day. Dx: E11.40 300 each 3   . aspirin EC 325 MG tablet Take 1 tablet (325 mg total) by mouth 2 (two) times daily after a meal. Take x 1 month post op to decrease risk of blood clots. (Patient not taking: Reported on 03/01/2019) 60 tablet 0 Not Taking at Unknown time  . Blood Glucose Monitoring Suppl (ACCU-CHEK AVIVA PLUS) w/Device KIT Use to check blood sugar three times a day.  Dx: E11.40 1 kit 0   . glucose blood (ACCU-CHEK AVIVA PLUS) test strip Use to check blood sugar three times a day.  Dx: E11.40 300 each 3    Allergies  Allergen Reactions  . Hydrochlorothiazide Rash  . Sulfonamide Derivatives Rash    Social History   Tobacco Use  . Smoking status: Never Smoker  . Smokeless tobacco: Never Used  Substance Use Topics  . Alcohol use: No    Alcohol/week: 0.0 standard drinks    Family History  Problem Relation Age of Onset  . Diabetes Sister   . Anxiety disorder Sister   . Heart disease  Brother   . Ovarian cancer Cousin   . Breast cancer Maternal Grandmother        grandmother  . Diabetes Maternal Aunt        aunt  . Alcohol abuse Maternal Uncle        uncle  . Diabetes Paternal Aunt   . Alcohol abuse Paternal Uncle      ROS ROS: I have reviewed the patient's review of systems thoroughly and there are no positive responses as relates to the HPI. Objective:  Physical Exam  Vital signs in last 24 hours:   There were no vitals filed for this visit.  Well-developed well-nourished patient in no acute distress. Alert and oriented x3 HEENT:within normal limits Cardiac: Regular rate and rhythm Pulmonary: Lungs clear to auscultation Abdomen: Soft and nontender.  Normal active bowel sounds  Musculoskeletal: (r knee:painful rom limited rom no instability Labs: Recent Results (from the past 2160 hour(s))  Urinalysis, Routine w reflex microscopic     Status: Abnormal   Collection Time: 03/04/19 10:11 AM  Result Value Ref Range   Color, Urine YELLOW YELLOW   APPearance CLEAR CLEAR   Specific Gravity, Urine 1.024 1.005 - 1.030   pH 7.0 5.0 - 8.0   Glucose, UA NEGATIVE NEGATIVE mg/dL   Hgb urine dipstick NEGATIVE NEGATIVE   Bilirubin Urine NEGATIVE NEGATIVE   Ketones, ur NEGATIVE NEGATIVE mg/dL   Protein, ur NEGATIVE NEGATIVE mg/dL   Nitrite NEGATIVE NEGATIVE   Leukocytes,Ua TRACE (A) NEGATIVE   RBC / HPF 0-5 0 - 5 RBC/hpf   WBC, UA 6-10 0 - 5 WBC/hpf   Bacteria, UA NONE SEEN NONE SEEN   Squamous Epithelial / LPF 0-5 0 - 5   Mucus PRESENT    Ca Oxalate Crys, UA PRESENT     Comment: Performed at [facility], [place] [address], [place], [place] [address]  Glucose, capillary     Status: Abnormal   Collection Time: 03/04/19 10:16 AM  Result Value Ref Range   Glucose-Capillary 112 (H) 70 - 99 mg/dL  APTT     Status: None   Collection Time: 03/04/19 10:52 AM  Result Value Ref Range   aPTT 31 24 - 36 seconds  Comment: Performed at [facility], [place] [address], [place], [place] [address]  CBC WITH DIFFERENTIAL     Status: None   Collection Time: 03/04/19 10:52 AM  Result Value Ref Range   WBC 7.5 4.0 - 10.5 K/uL   RBC 4.40 3.87 - 5.11 MIL/uL   Hemoglobin 12.9 12.0 - 15.0 g/dL   HCT 40.7 36.0 - 46.0 %   MCV 92.5 80.0 - 100.0 fL   MCH 29.3 26.0 - 34.0 pg   MCHC 31.7 30.0 - 36.0 g/dL   RDW 14.2 11.5 - 15.5 %   Platelets 362 150 - 400 K/uL   nRBC 0.0 0.0 - 0.2 %   Neutrophils Relative % 57 %   Neutro Abs 4.4 1.7 - 7.7 K/uL   Lymphocytes Relative 28 %   Lymphs Abs 2.1 0.7 - 4.0 K/uL   Monocytes Relative 7 %   Monocytes Absolute 0.5 0.1 - 1.0 K/uL   Eosinophils Relative 6 %   Eosinophils Absolute 0.4 0.0 - 0.5 K/uL   Basophils Relative 1 %   Basophils Absolute 0.1 0.0 - 0.1 K/uL   Immature Granulocytes 1 %   Abs Immature Granulocytes 0.05 0.00 - 0.07 K/uL    Comment: Performed at [facility], [place] [address], [place], [place] [address]  Comprehensive metabolic panel     Status: Abnormal   Collection Time: 03/04/19 10:52 AM  Result Value Ref Range   Sodium 140 135 - 145 mmol/L   Potassium 4.0 3.5 - 5.1 mmol/L   Chloride 109 98 - 111 mmol/L   CO2 25 22 - 32 mmol/L   Glucose, Bld 114 (H) 70 - 99 mg/dL   BUN 23 8 - 23 mg/dL   Creatinine, Ser 0.59 0.44 - 1.00 mg/dL   Calcium 8.6 (L) 8.9 - 10.3 mg/dL   Total Protein 6.9 6.5 - 8.1 g/dL   Albumin 4.0 3.5 - 5.0 g/dL   AST 19 15 - 41 U/L   ALT 23 0 - 44 U/L   Alkaline Phosphatase 75 38 - 126 U/L   Total Bilirubin 0.5 0.3 - 1.2 mg/dL   GFR calc non Af Amer >60 >60 mL/min   GFR calc Af Amer >60 >60 mL/min   Anion gap 6 5 - 15    Comment: Performed at [facility], [place] [address], [place], [place] [address]  Protime-INR     Status: None   Collection Time: 03/04/19 10:52 AM  Result Value Ref Range   Prothrombin Time 11.6 11.4 - 15.2 seconds   INR 0.9 0.8 - 1.2    Comment: (NOTE) INR goal varies based on device and  disease states. Performed at [facility], [place] [address], [place], [place] [address]   Type and screen Order type and screen if day of surgery is less than 15 days from draw of preadmission visit or order morning of surgery if day of surgery is greater than 6 days from preadmission visit.     Status: None   Collection Time: 03/04/19 10:52 AM  Result Value Ref Range   ABO/RH(D) O POS    Antibody Screen NEG    Sample Expiration 03/14/2019,2359    Extend sample reason      NO TRANSFUSIONS OR PREGNANCY IN THE PAST 3 MONTHS Performed at [facility], [place] [address] [place], [place] [address]   Hemoglobin A1c     Status: Abnormal   Collection Time: 03/04/19 10:52 AM  Result Value Ref Range   Hgb A1c MFr Bld 6.4 (H) 4.8 - 5.6 %    Comment: (NOTE)         Prediabetes: 5.7 - 6.4         Diabetes: >6.4         Glycemic control for adults with diabetes: <7.0    Mean Plasma Glucose 137 mg/dL    Comment: (NOTE) Performed At: [place], [place] [id number] Rush Farmer MD [id number]   Surgical pcr screen     Status: None   Collection Time: 03/04/19 11:02 AM   Specimen: Nasal Swab  Result Value Ref Range   MRSA, PCR NEGATIVE NEGATIVE   Staphylococcus aureus NEGATIVE NEGATIVE    Comment: (NOTE) The Xpert SA Assay (FDA approved for NASAL specimens in patients 48 years of age and older), is one component of a comprehensive surveillance program. It is not intended to diagnose infection nor to guide or monitor treatment. Performed at [facility], [place] [address], [place], [place] [address]   Novel Coronavirus, NAA (hospital order; send-out to ref lab)     Status: None   Collection Time: 03/08/19 11:09 AM   Specimen: Nasopharyngeal Swab; Respiratory  Result Value Ref Range   SARS-CoV-2, NAA NOT DETECTED NOT DETECTED    Comment: (NOTE) This test was developed and its performance  characteristics determined by [employer]. This test has not been FDA cleared or approved. This test has been authorized by FDA under an Emergency Use Authorization (EUA). This test is only authorized for the duration of time the declaration that circumstances exist justifying the authorization of the emergency use of in vitro diagnostic tests for detection of SARS-CoV-2 virus and/or diagnosis of COVID-19 infection under section 564(b)(1) of the Act, 21 U.S.C. 570VXB-9(T)(9), unless the authorization is terminated or revoked sooner. When diagnostic testing is negative, the possibility of a false negative result should be considered in the context of a patient's recent exposures and the presence of clinical signs and symptoms consistent with COVID-19. An individual without symptoms of COVID-19 and who is not shedding SARS-CoV-2 virus would expect to have a negative (not detected) result in this assay. Performed  At: [facility] [place], [place] [id number] Rush Farmer MD [id number]    Coronavirus Source NASOPHARYNGEAL     Comment: Performed at [facility], [place] [address], [place], [place] [address]    Estimated body mass index is 28.07 kg/m as calculated from the following:   Height as of 03/04/19: _0  (1.549 m).   Weight as of 03/04/19: 67.4 kg.   Imaging Review Plain radiographs demonstrate severe degenerative joint disease of the right knee(s). The overall alignment ismild varus. The bone quality appears to be fair for age and reported activity level.      Assessment/Plan:  End stage arthritis, right knee   The patient history, physical examination, clinical judgment of the provider and imaging studies are consistent with end stage degenerative joint disease of the right knee(s) and total knee arthroplasty is deemed medically necessary. The treatment options including medical management, injection therapy arthroscopy and  arthroplasty were discussed at length. The risks and benefits of total knee arthroplasty were presented and reviewed. The risks due to aseptic loosening, infection, stiffness, patella tracking problems, thromboembolic complications and other imponderables were discussed. The patient acknowledged the explanation, agreed to proceed with the plan and consent was signed. Patient is being admitted for inpatient treatment for  surgery, pain control, PT, OT, prophylactic antibiotics, VTE prophylaxis, progressive ambulation and ADL's and discharge planning. The patient is planning to be discharged home with home health services

## 2019-03-11 NOTE — Discharge Instructions (Signed)

## 2019-03-12 DIAGNOSIS — E1149 Type 2 diabetes mellitus with other diabetic neurological complication: Secondary | ICD-10-CM | POA: Diagnosis not present

## 2019-03-12 DIAGNOSIS — Z7983 Long term (current) use of bisphosphonates: Secondary | ICD-10-CM | POA: Diagnosis not present

## 2019-03-12 DIAGNOSIS — Z791 Long term (current) use of non-steroidal anti-inflammatories (NSAID): Secondary | ICD-10-CM | POA: Diagnosis not present

## 2019-03-12 DIAGNOSIS — Z96642 Presence of left artificial hip joint: Secondary | ICD-10-CM | POA: Diagnosis not present

## 2019-03-12 DIAGNOSIS — I10 Essential (primary) hypertension: Secondary | ICD-10-CM | POA: Diagnosis not present

## 2019-03-12 DIAGNOSIS — K219 Gastro-esophageal reflux disease without esophagitis: Secondary | ICD-10-CM | POA: Diagnosis not present

## 2019-03-12 DIAGNOSIS — M1711 Unilateral primary osteoarthritis, right knee: Secondary | ICD-10-CM | POA: Diagnosis not present

## 2019-03-12 DIAGNOSIS — Z79899 Other long term (current) drug therapy: Secondary | ICD-10-CM | POA: Diagnosis not present

## 2019-03-12 DIAGNOSIS — Z882 Allergy status to sulfonamides status: Secondary | ICD-10-CM | POA: Diagnosis not present

## 2019-03-12 DIAGNOSIS — Z7982 Long term (current) use of aspirin: Secondary | ICD-10-CM | POA: Diagnosis not present

## 2019-03-12 DIAGNOSIS — M797 Fibromyalgia: Secondary | ICD-10-CM | POA: Diagnosis not present

## 2019-03-12 DIAGNOSIS — Z888 Allergy status to other drugs, medicaments and biological substances status: Secondary | ICD-10-CM | POA: Diagnosis not present

## 2019-03-12 LAB — CBC
HCT: 40.1 % (ref 36.0–46.0)
Hemoglobin: 12.6 g/dL (ref 12.0–15.0)
MCH: 29 pg (ref 26.0–34.0)
MCHC: 31.4 g/dL (ref 30.0–36.0)
MCV: 92.4 fL (ref 80.0–100.0)
Platelets: 353 10*3/uL (ref 150–400)
RBC: 4.34 MIL/uL (ref 3.87–5.11)
RDW: 13.6 % (ref 11.5–15.5)
WBC: 12.8 10*3/uL — ABNORMAL HIGH (ref 4.0–10.5)
nRBC: 0 % (ref 0.0–0.2)

## 2019-03-12 NOTE — Progress Notes (Signed)
Subjective: 1 Day Post-Op Procedure(s) (LRB): TOTAL KNEE ARTHROPLASTY (Right) Patient reports pain as mild.  Taking by mouth and voiding okay.  Did great with physical therapy.  Ambulated in the hall.  No chest pain or shortness of breath.  She reports she is ready to go home.  Objective: Vital signs in last 24 hours: Temp:  [97.4 F (36.3 C)-98.2 F (36.8 C)] 97.9 F (36.6 C) (06/13 1052) Pulse Rate:  [61-104] 90 (06/13 1052) Resp:  [10-18] 15 (06/13 1052) BP: (116-157)/(67-113) 157/83 (06/13 1052) SpO2:  [95 %-100 %] 98 % (06/13 1052) Weight:  [67.4 kg] 67.4 kg (06/12 1245)  Intake/Output from previous day: 06/12 0701 - 06/13 0700 In: 2720.3 [P.O.:360; I.V.:2160.3; IV Piggyback:200] Out: 3650 [Urine:3600; Blood:50] Intake/Output this shift: Total I/O In: 240 [P.O.:240] Out: 400 [Urine:400]  Recent Labs    03/12/19 0240  HGB 12.6   Recent Labs    03/12/19 0240  WBC 12.8*  RBC 4.34  HCT 40.1  PLT 353   No results for input(s): NA, K, CL, CO2, BUN, CREATININE, GLUCOSE, CALCIUM in the last 72 hours. No results for input(s): LABPT, INR in the last 72 hours. Right knee exam: Neurovascular intact Sensation intact distally Intact pulses distally Dorsiflexion/Plantar flexion intact Incision: dressing C/D/I Compartment soft   Assessment/Plan: 1 Day Post-Op Procedure(s) (LRB): TOTAL KNEE ARTHROPLASTY (Right) Plan: Aspirin 325 mg twice daily with food x1 month postop for DVT prophylaxis. Weight-bear as tolerated on right. Up with therapy Discharge home with home health Follow-up with Dr. Berenice Primas in 2 weeks.   Patient's anticipated LOS is less than 2 midnights, meeting these requirements: - Younger than 13 - Lives within 1 hour of care - Has a competent adult at home to recover with post-op recover - NO history of  - Chronic pain requiring opiods  - Diabetes  - Coronary Artery Disease  - Heart failure  - Heart attack  - Stroke  - DVT/VTE  - Cardiac  arrhythmia  - Respiratory Failure/COPD  - Renal failure  - Anemia  - Advanced Liver disease       Erlene Senters 03/12/2019, 11:30 AM

## 2019-03-12 NOTE — Op Note (Signed)
NAME: ROSLAND, RIDING [facility] MEDICAL RECORD JA:[id number] ACCOUNT [id number] DATE OF BIRTH:Jul 01, 1956 FACILITY[facility] LOCATION: [facility] PHYSICIAN:Ellis Mehaffey L. Reizel Calzada, MD  OPERATIVE REPORT  DATE OF PROCEDURE:  03/11/2019  PREOPERATIVE DIAGNOSIS:  End-stage degenerative joint disease, right knee.  POSTOPERATIVE DIAGNOSIS:  End-stage degenerative joint disease, right knee.  PROCEDURE:  Right total knee replacement with an Attune system, size 4 narrow femur, size 4 tibia, 5 mm bridging bearing, and a 35 mm all polyethylene patella.  SURGEON:  Dorna Leitz, MD  ASSISTANT:  Gaspar Skeeters PA-C, was present for the entire case and assisted by retraction, bone cuts, and closing to minimize OR time.  BRIEF HISTORY:  The patient is a 63 year old female with a long history of significant complaints of right knee pain.  She has been treated conservatively for a prolonged period of time, and after failure of all conservative care, she was taken to the  operating room for right total knee replacement.  The patient was having night pain and light activity pain and failed all conservative care.  DESCRIPTION OF PROCEDURE:  The patient comes to the operating room after adequate anesthesia was obtained with spinal anesthetic.  The patient was placed on the operating table.  The right leg was then prepped and draped in the usual sterile fashion.   Following this, the leg was exsanguinated.  Blood pressure inflated to 250 mmHg.  Following this, an incision was made for approach for a total knee.  Subcutaneous tissue down to the level of the extensor mechanism.  A medial parapatellar arthrotomy was  undertaken.  Once that was completed, attention was turned towards the medial and lateral meniscus and removed retropatellar fat pad, synovium on the anterior aspect of the femur and anterior and posterior cruciates.  Once this was completed, an  intramedullary pilot hole was drilled and a 4-degree valgus inclination cut was  made.  Following this, the femur was sized to a 4.  Its anterior and posterior cuts were made, chamfers and box, and following this, attention was turned to the tibia.  It  was cut perpendicular to its long axis with a 3-degree posterior slope, and a size 4 paddle was chosen.  The osteophytes medially had been removed.  Once this was done, I drilled and keeled for a size 4.  Trial components were put into place with a 5 mm  bridging bearing.  Excellent range of motion and stability were achieved.  Attention was turned to the patella, cut down to a level of 13 mm, and a 35 paddle was chosen and it was drilled.  The trial button was put in place.  Excellent range of motion  and stability were achieved at this point.  At this point, all the trial components were removed, and the knee was copiously and thoroughly lavaged with pulsatile lavage irrigation and suctioned dry.  The attention was then turned towards cementing in  the final components, a size 4 narrow femur, a size 4 tibia, a 5 mm bridging bearing, and a 35 all poly patella was placed and held with a clamp.  All excess bone cements were removed, and once the cement was completely hardened, the tourniquet was let  down, all bleeders controlled with electrocautery, and the trial poly was then removed.  The final poly was opened and placed.  All excess cement was removed again and checked once the poly was out.  Excellent range of motion and stability were achieved.   A medial parapatellar arthrotomy was then closed with 1  Vicryl running, the skin with 0 and 2-0 Vicryl and 3-0 Monocryl subcuticular.  Benzoin and Steri-Strips were applied.  A sterile compressive dressing was applied.  The patient was taken to  recovery and was noted to be in satisfactory condition.  Estimated blood loss for the procedure was minimal.  The tourniquet time was somewhere under 50 minutes, but the final can be gotten from the anesthetic record.  LN/NUANCE  D:03/11/2019  T:03/11/2019 JOB:[id number]

## 2019-03-12 NOTE — TOC Initial Note (Signed)
Transition of Care [facility]) - Initial/Assessment Note    Patient Details  Name: Lisa Odom MRN: [id number] Date of Birth: 07/22/1956  [facility]) CM/SW Contact:    Joaquin Courts, RN Phone Number: 03/12/2019, 12:28 PM  Clinical Narrative:        CM spoke with patient at bedside, patient lives with her sister who will be avaibale to help her as needed.  Patient set up with [facility] for HHPT, has rolling walker and 3-in-1 at home.            Expected Discharge Plan: [place] Barriers to Discharge: Continued Medical Work up   Patient Goals and CMS Choice Patient states their goals for this hospitalization and ongoing recovery are:: to go home CMS Medicare.gov Compare Post Acute Care list provided to:: Patient Choice offered to / list presented to : Patient  Expected Discharge Plan and Services Expected Discharge Plan: [place]   Discharge Planning Services: CM Consult Post Acute Care Choice: [place] arrangements for the past 2 months: Single Family Home Expected Discharge Date: 03/12/19               DME Arranged: N/A DME Agency: NA       HH Arranged: PT HH Agency: [facility] (formerly [employer]) Date [place]: 03/12/19 Time [place]: 1228 Representative spoke with at [place]: set up in doctors office  Prior Living Arrangements/Services Living arrangements for the past 2 months: [place] with:: Siblings Patient language and need for interpreter reviewed:: Yes Do you feel safe going back to the place where you live?: Yes      Need for Family Participation in Patient Care: Yes (Comment) Care giver support system in place?: Yes (comment)   Criminal Activity/Legal Involvement Pertinent to Current Situation/Hospitalization: No - Comment as needed  Activities of Daily Living Home Assistive Devices/Equipment: Cane (specify quad or  straight), Walker (specify type), Bedside commode/3-in-1, Eyeglasses ADL Screening (condition at time of admission) Patient's cognitive ability adequate to safely complete daily activities?: Yes Is the patient deaf or have difficulty hearing?: No Does the patient have difficulty seeing, even when wearing glasses/contacts?: No Does the patient have difficulty concentrating, remembering, or making decisions?: Yes Patient able to express need for assistance with ADLs?: Yes Does the patient have difficulty dressing or bathing?: No Independently performs ADLs?: Yes (appropriate for developmental age) Does the patient have difficulty walking or climbing stairs?: Yes Weakness of Legs: None Weakness of Arms/Hands: None  Permission Sought/Granted                  Emotional Assessment Appearance:: Appears stated age Attitude/Demeanor/Rapport: Engaged Affect (typically observed): Accepting Orientation: : Oriented to Self, Oriented to Place, Oriented to  Time, Oriented to Situation   Psych Involvement: No (comment)  Admission diagnosis:  DEGENERATIVE JOINT DISEASE RIGHT KNEE Patient Active Problem List   Diagnosis Date Noted  . Primary osteoarthritis of right knee 03/11/2019  . Pre-op evaluation 11/23/2018  . Primary osteoarthritis of left hip 11/20/2017  . Intrapelvic protrusion of left acetabulum 11/20/2017  . Left arm pain 01/09/2017  . Bilateral bunions 06/13/2016  . Bilateral low back pain with left-sided sciatica 06/13/2016  . Confusion 04/30/2016  . Balance problem 04/10/2016  . Left hip pain 12/20/2015  . Memory loss 12/20/2015  . Osteoporosis 05/29/2015  . Fibromyalgia 05/22/2015  . Spinal stenosis of cervical region 09/12/2014  .  Stenosis of cervical spine with myelopathy ([place]) 09/12/2014  . Cervical (neck) region somatic dysfunction   . Elevated CK 08/17/2014  . Chronic neck pain 08/17/2014  . Bilateral anterior knee pain 11/04/2013  . 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 Bilateral foot pain 02/22/2013  . Bilateral leg pain, nighttime 11/30/2012  . Carpal tunnel syndrome of left wrist 11/30/2012  . Panic attack 02/16/2012  . GERD (gastroesophageal reflux disease) 09/05/2011  . ESSENTIAL HYPERTENSION, BENIGN 04/27/2009  . HYPERCHOLESTEROLEMIA 12/19/2008  . Major depressive disorder, recurrent episode, severe ([place]) 12/19/2008  . MIGRAINE, COMMON 12/19/2008  . ALLERGIC RHINITIS 12/19/2008  . GASTRIC ULCER, ACUTE 12/19/2008  . DIVERTICULITIS, COLON 12/19/2008   PCP:  [name], MD Pharmacy:   [facility] [phone], [place] [address] Phone: [phone] Fax: [phone]  [place], [place] [address] [place] [address] Phone: [phone] Fax: [place], [place] [address] Phone: [phone] Fax: [phone]     Social Determinants of Health (SDOH) Interventions    Readmission Risk Interventions No flowsheet data found.

## 2019-03-12 NOTE — Care Management Obs Status (Signed)
[place] NOTIFICATION   Patient Details  Name: Lisa Odom MRN: [id number] Date of Birth: Aug 14, 1956   Medicare Observation Status Notification Given:  Yes    Joaquin Courts, RN 03/12/2019, 12:55 PM

## 2019-03-12 NOTE — Progress Notes (Signed)

## 2019-03-12 NOTE — Progress Notes (Signed)
Physical Therapy Treatment Patient Details Name: Lisa Odom MRN: [id number] DOB: 05-26-1956 Today's Date: 03/12/2019    History of Present Illness 63 yo female s/p R TKR on 03/11/19. PMH includes OA, LBP, fibromyalgia, OP, ACDF, HTN, depression, peripheral neuropathy, R RTC repair, L THA 10/2017.    PT Comments    Pt demonstrating proficiency in bed mobility, transfers, ambulation, stair navigation, and LE exercises post-operatively. Pt ambulated 2x125 ft this session, and practiced stair navigation for d/c home. PT administered, reviewed, and practiced LE exercises with pt, pt performs LE exercises well and has no further questions. Pt met PT acute goals, ready to d/c from a mobility standpoint. RN notified.     Follow Up Recommendations  Follow surgeon's recommendation for DC plan and follow-up therapies;Supervision for mobility/OOB(HHPT)     Equipment Recommendations  None recommended by PT    Recommendations for Other Services       Precautions / Restrictions Precautions Precautions: Fall Restrictions Weight Bearing Restrictions: No Other Position/Activity Restrictions: WBAT    Mobility  Bed Mobility Overal bed mobility: Needs Assistance Bed Mobility: Supine to Sit     Supine to sit: Supervision     General bed mobility comments: supervision for safety, increased time to come to sitting.  Transfers Overall transfer level: Needs assistance Equipment used: Rolling walker (2 wheeled) Transfers: Sit to/from Stand Sit to Stand: Supervision         General transfer comment: supervision for safety, VC for hand placement when rising.  Ambulation/Gait Ambulation/Gait assistance: Supervision Gait Distance (Feet): 250 [phone] (to and from gym)) Assistive device: Rolling walker (2 wheeled) Gait Pattern/deviations: Step-through pattern;Decreased stride length;Trunk flexed Gait velocity: slightly decr   General Gait Details: supervision for safety, verbal  cuing for upright posture, sequencing with step-through gait.   Stairs Stairs: Yes Stairs assistance: Min guard Stair Management: Two rails;Step to pattern;Forwards Number of Stairs: 4 General stair comments: min guard for safety, verbal cuing for sequencing.   Wheelchair Mobility    Modified Rankin (Stroke Patients Only)       Balance Overall balance assessment: Mild deficits observed, not formally tested                                          Cognition Arousal/Alertness: Awake/alert Behavior During Therapy: WFL for tasks assessed/performed Overall Cognitive Status: Within Functional Limits for tasks assessed                                        Exercises Total Joint Exercises Ankle Circles/Pumps: AROM;5 reps;Both;Supine Quad Sets: AROM;Right;10 reps;Supine Towel Squeeze: AROM;Both;10 reps;Supine Short Arc Quad: AROM;Right;10 reps;Supine Heel Slides: AAROM;Right;10 reps;Supine Hip ABduction/ADduction: AROM;Right;10 reps;Supine Straight Leg Raises: AROM;Right;10 reps;Supine Knee Flexion: AAROM;Right;5 reps;Seated;Other (comment)(with towel underneath foot to assist slide) Goniometric ROM: knee aarom ~80*, limited by pain    General Comments        Pertinent Vitals/Pain Pain Assessment: 0-10 Pain Score: 4  Pain Location: R knee Pain Descriptors / Indicators: Sore Pain Intervention(s): Repositioned;Monitored during session;Limited activity within patient's tolerance;Ice applied    Home Living                      Prior Function            PT Goals (current goals can  now be found in the care plan section) Acute Rehab PT Goals Patient Stated Goal: move better PT Goal Formulation: With patient Time For Goal Achievement: 03/18/19 Potential to Achieve Goals: Good Progress towards PT goals: Progressing toward goals    Frequency    7X/week      PT Plan Current plan remains appropriate    Co-evaluation               AM-PAC PT "6 Clicks" Mobility   Outcome Measure  Help needed turning from your back to your side while in a flat bed without using bedrails?: None Help needed moving from lying on your back to sitting on the side of a flat bed without using bedrails?: None Help needed moving to and from a bed to a chair (including a wheelchair)?: A Little Help needed standing up from a chair using your arms (e.g., wheelchair or bedside chair)?: A Little Help needed to walk in hospital room?: A Little Help needed climbing 3-5 steps with a railing? : A Little 6 Click Score: 20    End of Session Equipment Utilized During Treatment: Gait belt Activity Tolerance: Patient tolerated treatment well Patient left: in chair;with call bell/phone within reach(pt verbally agrees not to mobilize without pressing call button and waiting for assist; Pt on SCD break for skin integrity) Nurse Communication: Mobility status PT Visit Diagnosis: Other abnormalities of gait and mobility (R26.89);Difficulty in walking, not elsewhere classified (R26.2)     Time: 3846-6599 PT Time Calculation (min) (ACUTE ONLY): 37 min  Charges:  $Gait Training: 8-22 mins $Therapeutic Exercise: 8-22 mins                     Julien Girt, PT Acute Rehabilitation Services Pager [phone]  Office [place] 03/12/2019, 11:58 AM

## 2019-03-12 NOTE — Discharge Summary (Signed)
Patient ID: Lisa Odom MRN: [id number] DOB/AGE: [birth year] 63 y.o.  Admit date: 03/11/2019 Discharge date: 03/12/2019  Admission Diagnoses:  Principal Problem:   Primary osteoarthritis of right knee   Discharge Diagnoses:  Same  Past Medical History:  Diagnosis Date  . Anxiety   . Arthritis   . Contact lens/glasses fitting    wears contacts or glasses  . Depression   . Diabetes mellitus without complication (HCC)    No meds  . Diverticulosis   . Family history of adverse reaction to anesthesia    pts sister has severe N/V  . Fibromyalgia   . Gastritis   . GERD (gastroesophageal reflux disease)   . Hemorrhoid   . Hiatal hernia    neuropathy  . Migraine   . Neuropathy   . Panic attacks   . Pneumonia    as an infant    Surgeries: Procedure(s): Right TOTAL KNEE ARTHROPLASTY on 03/11/2019   Consultants:   Discharged Condition: Improved  Hospital Course: Lisa Odom is an 63 y.o. female who was admitted 03/11/2019 for operative treatment ofPrimary osteoarthritis of right knee. Patient has severe unremitting pain that affects sleep, daily activities, and work/hobbies. After pre-op clearance the patient was taken to the operating room on 03/11/2019 and underwent  Procedure(s): Right TOTAL KNEE ARTHROPLASTY.    Patient was given perioperative antibiotics:  Anti-infectives (From admission, onward)   Start     Dose/Rate Route Frequency Ordered Stop   03/11/19 1600  ceFAZolin (ANCEF) IVPB 2g/100 mL premix     2 g 200 mL/hr over 30 Minutes Intravenous Every 6 hours 03/11/19 1323 03/11/19 2321   03/11/19 0745  ceFAZolin (ANCEF) IVPB 2g/100 mL premix     2 g 200 mL/hr over 30 Minutes Intravenous On call to O.R. 03/11/19 0739 03/11/19 1003       Patient was given sequential compression devices, early ambulation, and chemoprophylaxis to prevent DVT.  Patient benefited maximally from hospital stay and there were no complications.    Recent vital signs:   Patient Vitals for the past 24 hrs:  BP Temp Temp src Pulse Resp SpO2 Height Weight  03/12/19 1052 (!) 157/83 97.9 F (36.6 C) Oral 90 15 98 % - -  03/12/19 0500 (!) 116/96 98.2 F (36.8 C) Oral 83 16 97 % - -  03/12/19 0050 138/79 (!) 97.5 F (36.4 C) Oral 93 16 95 % - -  03/11/19 2112 (!) 145/92 97.8 F (36.6 C) Oral 92 16 95 % - -  03/11/19 1734 127/67 98 F (36.7 C) - (!) 104 16 98 % - -  03/11/19 1552 133/78 98.2 F (36.8 C) - 78 18 100 % - -  03/11/19 1436 (!) 151/82 97.8 F (36.6 C) - 72 16 100 % - -  03/11/19 1354 (!) 153/78 97.8 F (36.6 C) - 65 16 100 % - -  03/11/19 1245 (!) 141/89 (!) 97.4 F (36.3 C) Oral 61 14 100 % 5' 1"  (1.549 m) 67.4 kg  03/11/19 1230 (!) 130/113 - - 66 10 99 % - -  03/11/19 1215 138/88 - - 69 13 97 % - -  03/11/19 1200 (!) 152/96 - - 65 13 100 % - -  03/11/19 1158 138/82 (!) 97.4 F (36.3 C) - 67 12 100 % - -     Recent laboratory studies:  Recent Labs    03/12/19 0240  WBC 12.8*  HGB 12.6  HCT 40.1  PLT 353  Discharge Medications:   Allergies as of 03/12/2019      Reactions   Hydrochlorothiazide Rash   Sulfonamide Derivatives Rash      Medication List    STOP taking these medications   meloxicam 15 MG tablet Commonly known as: MOBIC     TAKE these medications   Accu-Chek Aviva Plus w/Device Kit Use to check blood sugar three times a day.  Dx: E11.40   Accu-Chek Softclix Lancets lancets Use to check blood sugar three times a day. Dx: E11.40   alendronate 70 MG tablet Commonly known as: FOSAMAX Take 1 tablet (70 mg total) by mouth every 7 (seven) days. Take with a full glass of water on an empty stomach.   ALPRAZolam 0.5 MG tablet Commonly known as: XANAX Take 0.5 mg by mouth 3 (three) times daily as needed for anxiety.   aspirin EC 325 MG tablet Take 1 tablet (325 mg total) by mouth 2 (two) times daily after a meal. Take x 1 month post op to decrease risk of blood clots. What changed: Another medication with  the same name was removed. Continue taking this medication, and follow the directions you see here.   atorvastatin 40 MG tablet Commonly known as: LIPITOR Take 1 tablet (40 mg total) by mouth daily.   b complex vitamins capsule Take 1 capsule by mouth every 3 (three) days.   buPROPion 150 MG 24 hr tablet Commonly known as: WELLBUTRIN XL Take 1 tablet (150 mg total) by mouth daily.   CALCIUM-VITAMIN D PO Take 600 mg by mouth 2 (two) times a day.   docusate sodium 100 MG capsule Commonly known as: Colace Take 1 capsule (100 mg total) by mouth 2 (two) times daily.   DULoxetine 60 MG capsule Commonly known as: CYMBALTA TAKE 2 CAPSULES (120 MG TOTAL) BY MOUTH DAILY. What changed:   how much to take  how to take this  when to take this   fexofenadine 180 MG tablet Commonly known as: Allegra Allergy Take 1 tablet (180 mg total) by mouth daily. What changed:   when to take this  reasons to take this   Fish Oil 1200 MG Caps Take 1,200 mg by mouth daily.   gabapentin 600 MG tablet Commonly known as: NEURONTIN Take 0.5 tablets (300 mg total) by mouth See admin instructions. Take 300 mg by mouth in the morning and after lunch and 1200 mg at bedtime What changed: how much to take   glucose blood test strip Commonly known as: Accu-Chek Aviva Plus Use to check blood sugar three times a day.  Dx: E11.40   methylphenidate 20 MG tablet Commonly known as: RITALIN Take 20 mg by mouth 3 (three) times daily with meals.   multivitamin tablet Take 1 tablet by mouth daily.   oxyCODONE-acetaminophen 5-325 MG tablet Commonly known as: PERCOCET/ROXICET Take 1-2 tablets by mouth every 6 (six) hours as needed for severe pain.   pantoprazole 40 MG tablet Commonly known as: PROTONIX Take 1 tablet (40 mg total) by mouth daily.   tiZANidine 4 MG tablet Commonly known as: ZANAFLEX Take 0.5 tablets (2 mg total) by mouth every 8 (eight) hours as needed for muscle spasms. What  changed: how much to take   TURMERIC COMPLEX/BLACK PEPPER PO Take 1 capsule by mouth daily.            Discharge Care Instructions  (From admission, onward)         Start     Ordered  03/12/19 0000  Weight bearing as tolerated    Question Answer Comment  Laterality right   Extremity Lower      03/12/19 1134          Diagnostic Studies: Dg Chest 2 View  Result Date: 03/04/2019 CLINICAL DATA:  Preoperative examination.  Patient for knee surgery. EXAM: CHEST - 2 VIEW COMPARISON:  PA and lateral chest 11/13/2017. FINDINGS: The lungs are clear. Heart size is normal. No pneumothorax or pleural effusion. No acute bony abnormality. Degenerative change right glenohumeral joint noted. The patient is status post cervical fusion. IMPRESSION: No acute disease. Electronically Signed   By: Inge Rise M.D.   On: 03/04/2019 16:23    Disposition: Discharge disposition: 01-Home or Self Care       Discharge Instructions    CPM   Complete by: As directed    Continuous passive motion machine (CPM):      Use the CPM from 0 to 70 degrees for 8 hours per day.      You may increase by 5-10 per day.  You may break it up into 2 or 3 sessions per day.      Use CPM for 1-2 weeks or until you are told to stop.   Call MD / Call 911   Complete by: As directed    If you experience chest pain or shortness of breath, CALL 911 and be transported to the hospital emergency room.  If you develope a fever above 101 F, pus (white drainage) or increased drainage or redness at the wound, or calf pain, call your surgeon's office.   Constipation Prevention   Complete by: As directed    Drink plenty of fluids.  Prune juice may be helpful.  You may use a stool softener, such as Colace (over the counter) 100 mg twice a day.  Use MiraLax (over the counter) for constipation as needed.   Diet Carb Modified   Complete by: As directed    Do not put a pillow under the knee. Place it under the heel.   Complete  by: As directed    Increase activity slowly as tolerated   Complete by: As directed    Weight bearing as tolerated   Complete by: As directed    Laterality: right   Extremity: Lower      Follow-up Information    Dorna Leitz, MD. Schedule an appointment as soon as possible for a visit in 2 weeks.   Specialty: Orthopedic Surgery Contact information: [place] [address] [phone]            Signed: Erlene Senters 03/12/2019, 11:34 AM

## 2019-03-13 DIAGNOSIS — Z96651 Presence of right artificial knee joint: Secondary | ICD-10-CM | POA: Diagnosis not present

## 2019-03-13 DIAGNOSIS — K219 Gastro-esophageal reflux disease without esophagitis: Secondary | ICD-10-CM | POA: Diagnosis not present

## 2019-03-13 DIAGNOSIS — E7800 Pure hypercholesterolemia, unspecified: Secondary | ICD-10-CM | POA: Diagnosis not present

## 2019-03-13 DIAGNOSIS — Z96642 Presence of left artificial hip joint: Secondary | ICD-10-CM | POA: Diagnosis not present

## 2019-03-13 DIAGNOSIS — Z471 Aftercare following joint replacement surgery: Secondary | ICD-10-CM | POA: Diagnosis not present

## 2019-03-13 DIAGNOSIS — M5442 Lumbago with sciatica, left side: Secondary | ICD-10-CM | POA: Diagnosis not present

## 2019-03-13 DIAGNOSIS — M792 Neuralgia and neuritis, unspecified: Secondary | ICD-10-CM | POA: Diagnosis not present

## 2019-03-13 DIAGNOSIS — M810 Age-related osteoporosis without current pathological fracture: Secondary | ICD-10-CM | POA: Diagnosis not present

## 2019-03-13 DIAGNOSIS — M5441 Lumbago with sciatica, right side: Secondary | ICD-10-CM | POA: Diagnosis not present

## 2019-03-13 DIAGNOSIS — M4802 Spinal stenosis, cervical region: Secondary | ICD-10-CM | POA: Diagnosis not present

## 2019-03-13 DIAGNOSIS — Z9181 History of falling: Secondary | ICD-10-CM | POA: Diagnosis not present

## 2019-03-13 DIAGNOSIS — E1140 Type 2 diabetes mellitus with diabetic neuropathy, unspecified: Secondary | ICD-10-CM | POA: Diagnosis not present

## 2019-03-14 ENCOUNTER — Encounter (HOSPITAL_COMMUNITY): Payer: Self-pay | Admitting: Orthopedic Surgery

## 2019-03-15 DIAGNOSIS — Z96642 Presence of left artificial hip joint: Secondary | ICD-10-CM | POA: Diagnosis not present

## 2019-03-15 DIAGNOSIS — Z9181 History of falling: Secondary | ICD-10-CM | POA: Diagnosis not present

## 2019-03-15 DIAGNOSIS — E7800 Pure hypercholesterolemia, unspecified: Secondary | ICD-10-CM | POA: Diagnosis not present

## 2019-03-15 DIAGNOSIS — M4802 Spinal stenosis, cervical region: Secondary | ICD-10-CM | POA: Diagnosis not present

## 2019-03-15 DIAGNOSIS — M810 Age-related osteoporosis without current pathological fracture: Secondary | ICD-10-CM | POA: Diagnosis not present

## 2019-03-15 DIAGNOSIS — M5441 Lumbago with sciatica, right side: Secondary | ICD-10-CM | POA: Diagnosis not present

## 2019-03-15 DIAGNOSIS — E1140 Type 2 diabetes mellitus with diabetic neuropathy, unspecified: Secondary | ICD-10-CM | POA: Diagnosis not present

## 2019-03-15 DIAGNOSIS — M792 Neuralgia and neuritis, unspecified: Secondary | ICD-10-CM | POA: Diagnosis not present

## 2019-03-15 DIAGNOSIS — Z471 Aftercare following joint replacement surgery: Secondary | ICD-10-CM | POA: Diagnosis not present

## 2019-03-15 DIAGNOSIS — M5442 Lumbago with sciatica, left side: Secondary | ICD-10-CM | POA: Diagnosis not present

## 2019-03-15 DIAGNOSIS — Z96651 Presence of right artificial knee joint: Secondary | ICD-10-CM | POA: Diagnosis not present

## 2019-03-15 DIAGNOSIS — K219 Gastro-esophageal reflux disease without esophagitis: Secondary | ICD-10-CM | POA: Diagnosis not present

## 2019-03-17 DIAGNOSIS — Z96642 Presence of left artificial hip joint: Secondary | ICD-10-CM | POA: Diagnosis not present

## 2019-03-17 DIAGNOSIS — Z96651 Presence of right artificial knee joint: Secondary | ICD-10-CM | POA: Diagnosis not present

## 2019-03-17 DIAGNOSIS — M5441 Lumbago with sciatica, right side: Secondary | ICD-10-CM | POA: Diagnosis not present

## 2019-03-17 DIAGNOSIS — Z471 Aftercare following joint replacement surgery: Secondary | ICD-10-CM | POA: Diagnosis not present

## 2019-03-17 DIAGNOSIS — M4802 Spinal stenosis, cervical region: Secondary | ICD-10-CM | POA: Diagnosis not present

## 2019-03-17 DIAGNOSIS — E1140 Type 2 diabetes mellitus with diabetic neuropathy, unspecified: Secondary | ICD-10-CM | POA: Diagnosis not present

## 2019-03-17 DIAGNOSIS — M792 Neuralgia and neuritis, unspecified: Secondary | ICD-10-CM | POA: Diagnosis not present

## 2019-03-17 DIAGNOSIS — K219 Gastro-esophageal reflux disease without esophagitis: Secondary | ICD-10-CM | POA: Diagnosis not present

## 2019-03-17 DIAGNOSIS — M5442 Lumbago with sciatica, left side: Secondary | ICD-10-CM | POA: Diagnosis not present

## 2019-03-17 DIAGNOSIS — E7800 Pure hypercholesterolemia, unspecified: Secondary | ICD-10-CM | POA: Diagnosis not present

## 2019-03-17 DIAGNOSIS — Z9181 History of falling: Secondary | ICD-10-CM | POA: Diagnosis not present

## 2019-03-17 DIAGNOSIS — M810 Age-related osteoporosis without current pathological fracture: Secondary | ICD-10-CM | POA: Diagnosis not present

## 2019-03-21 DIAGNOSIS — M4802 Spinal stenosis, cervical region: Secondary | ICD-10-CM | POA: Diagnosis not present

## 2019-03-21 DIAGNOSIS — M5441 Lumbago with sciatica, right side: Secondary | ICD-10-CM | POA: Diagnosis not present

## 2019-03-21 DIAGNOSIS — M810 Age-related osteoporosis without current pathological fracture: Secondary | ICD-10-CM | POA: Diagnosis not present

## 2019-03-21 DIAGNOSIS — M5442 Lumbago with sciatica, left side: Secondary | ICD-10-CM | POA: Diagnosis not present

## 2019-03-21 DIAGNOSIS — E7800 Pure hypercholesterolemia, unspecified: Secondary | ICD-10-CM | POA: Diagnosis not present

## 2019-03-21 DIAGNOSIS — Z96651 Presence of right artificial knee joint: Secondary | ICD-10-CM | POA: Diagnosis not present

## 2019-03-21 DIAGNOSIS — K219 Gastro-esophageal reflux disease without esophagitis: Secondary | ICD-10-CM | POA: Diagnosis not present

## 2019-03-21 DIAGNOSIS — Z96642 Presence of left artificial hip joint: Secondary | ICD-10-CM | POA: Diagnosis not present

## 2019-03-21 DIAGNOSIS — Z9181 History of falling: Secondary | ICD-10-CM | POA: Diagnosis not present

## 2019-03-21 DIAGNOSIS — M792 Neuralgia and neuritis, unspecified: Secondary | ICD-10-CM | POA: Diagnosis not present

## 2019-03-21 DIAGNOSIS — Z471 Aftercare following joint replacement surgery: Secondary | ICD-10-CM | POA: Diagnosis not present

## 2019-03-21 DIAGNOSIS — E1140 Type 2 diabetes mellitus with diabetic neuropathy, unspecified: Secondary | ICD-10-CM | POA: Diagnosis not present

## 2019-03-22 DIAGNOSIS — E1140 Type 2 diabetes mellitus with diabetic neuropathy, unspecified: Secondary | ICD-10-CM | POA: Diagnosis not present

## 2019-03-22 DIAGNOSIS — Z96642 Presence of left artificial hip joint: Secondary | ICD-10-CM | POA: Diagnosis not present

## 2019-03-22 DIAGNOSIS — M5442 Lumbago with sciatica, left side: Secondary | ICD-10-CM | POA: Diagnosis not present

## 2019-03-22 DIAGNOSIS — M810 Age-related osteoporosis without current pathological fracture: Secondary | ICD-10-CM | POA: Diagnosis not present

## 2019-03-22 DIAGNOSIS — Z96651 Presence of right artificial knee joint: Secondary | ICD-10-CM | POA: Diagnosis not present

## 2019-03-22 DIAGNOSIS — K219 Gastro-esophageal reflux disease without esophagitis: Secondary | ICD-10-CM | POA: Diagnosis not present

## 2019-03-22 DIAGNOSIS — M792 Neuralgia and neuritis, unspecified: Secondary | ICD-10-CM | POA: Diagnosis not present

## 2019-03-22 DIAGNOSIS — M1711 Unilateral primary osteoarthritis, right knee: Secondary | ICD-10-CM | POA: Diagnosis not present

## 2019-03-22 DIAGNOSIS — M5441 Lumbago with sciatica, right side: Secondary | ICD-10-CM | POA: Diagnosis not present

## 2019-03-22 DIAGNOSIS — M4802 Spinal stenosis, cervical region: Secondary | ICD-10-CM | POA: Diagnosis not present

## 2019-03-22 DIAGNOSIS — E7800 Pure hypercholesterolemia, unspecified: Secondary | ICD-10-CM | POA: Diagnosis not present

## 2019-03-22 DIAGNOSIS — Z471 Aftercare following joint replacement surgery: Secondary | ICD-10-CM | POA: Diagnosis not present

## 2019-03-22 DIAGNOSIS — Z9181 History of falling: Secondary | ICD-10-CM | POA: Diagnosis not present

## 2019-03-23 DIAGNOSIS — Z96651 Presence of right artificial knee joint: Secondary | ICD-10-CM | POA: Diagnosis not present

## 2019-03-23 DIAGNOSIS — R262 Difficulty in walking, not elsewhere classified: Secondary | ICD-10-CM | POA: Diagnosis not present

## 2019-03-23 DIAGNOSIS — M25661 Stiffness of right knee, not elsewhere classified: Secondary | ICD-10-CM | POA: Diagnosis not present

## 2019-03-28 DIAGNOSIS — M25661 Stiffness of right knee, not elsewhere classified: Secondary | ICD-10-CM | POA: Diagnosis not present

## 2019-03-28 DIAGNOSIS — R262 Difficulty in walking, not elsewhere classified: Secondary | ICD-10-CM | POA: Diagnosis not present

## 2019-03-28 DIAGNOSIS — Z96651 Presence of right artificial knee joint: Secondary | ICD-10-CM | POA: Diagnosis not present

## 2019-03-30 DIAGNOSIS — Z96651 Presence of right artificial knee joint: Secondary | ICD-10-CM | POA: Diagnosis not present

## 2019-03-30 DIAGNOSIS — R262 Difficulty in walking, not elsewhere classified: Secondary | ICD-10-CM | POA: Diagnosis not present

## 2019-03-30 DIAGNOSIS — M25661 Stiffness of right knee, not elsewhere classified: Secondary | ICD-10-CM | POA: Diagnosis not present

## 2019-04-04 DIAGNOSIS — R262 Difficulty in walking, not elsewhere classified: Secondary | ICD-10-CM | POA: Diagnosis not present

## 2019-04-04 DIAGNOSIS — M25661 Stiffness of right knee, not elsewhere classified: Secondary | ICD-10-CM | POA: Diagnosis not present

## 2019-04-04 DIAGNOSIS — Z96651 Presence of right artificial knee joint: Secondary | ICD-10-CM | POA: Diagnosis not present

## 2019-04-08 DIAGNOSIS — R262 Difficulty in walking, not elsewhere classified: Secondary | ICD-10-CM | POA: Diagnosis not present

## 2019-04-08 DIAGNOSIS — Z96651 Presence of right artificial knee joint: Secondary | ICD-10-CM | POA: Diagnosis not present

## 2019-04-08 DIAGNOSIS — M25661 Stiffness of right knee, not elsewhere classified: Secondary | ICD-10-CM | POA: Diagnosis not present

## 2019-04-11 DIAGNOSIS — M25661 Stiffness of right knee, not elsewhere classified: Secondary | ICD-10-CM | POA: Diagnosis not present

## 2019-04-13 DIAGNOSIS — Z96651 Presence of right artificial knee joint: Secondary | ICD-10-CM | POA: Diagnosis not present

## 2019-04-13 DIAGNOSIS — R262 Difficulty in walking, not elsewhere classified: Secondary | ICD-10-CM | POA: Diagnosis not present

## 2019-04-13 DIAGNOSIS — M25661 Stiffness of right knee, not elsewhere classified: Secondary | ICD-10-CM | POA: Diagnosis not present

## 2019-04-18 DIAGNOSIS — Z96651 Presence of right artificial knee joint: Secondary | ICD-10-CM | POA: Diagnosis not present

## 2019-04-18 DIAGNOSIS — R262 Difficulty in walking, not elsewhere classified: Secondary | ICD-10-CM | POA: Diagnosis not present

## 2019-04-18 DIAGNOSIS — M25661 Stiffness of right knee, not elsewhere classified: Secondary | ICD-10-CM | POA: Diagnosis not present

## 2019-04-20 DIAGNOSIS — R262 Difficulty in walking, not elsewhere classified: Secondary | ICD-10-CM | POA: Diagnosis not present

## 2019-04-20 DIAGNOSIS — M25661 Stiffness of right knee, not elsewhere classified: Secondary | ICD-10-CM | POA: Diagnosis not present

## 2019-04-20 DIAGNOSIS — Z96651 Presence of right artificial knee joint: Secondary | ICD-10-CM | POA: Diagnosis not present

## 2019-05-03 ENCOUNTER — Other Ambulatory Visit: Payer: Self-pay | Admitting: Family Medicine

## 2019-05-03 DIAGNOSIS — G894 Chronic pain syndrome: Secondary | ICD-10-CM

## 2019-05-12 DIAGNOSIS — M67912 Unspecified disorder of synovium and tendon, left shoulder: Secondary | ICD-10-CM | POA: Diagnosis not present

## 2019-05-12 DIAGNOSIS — M25661 Stiffness of right knee, not elsewhere classified: Secondary | ICD-10-CM | POA: Diagnosis not present

## 2019-06-03 ENCOUNTER — Other Ambulatory Visit: Payer: Self-pay | Admitting: Family Medicine

## 2019-06-22 ENCOUNTER — Telehealth: Payer: Self-pay | Admitting: Family Medicine

## 2019-06-22 DIAGNOSIS — E1140 Type 2 diabetes mellitus with diabetic neuropathy, unspecified: Secondary | ICD-10-CM

## 2019-06-22 DIAGNOSIS — M810 Age-related osteoporosis without current pathological fracture: Secondary | ICD-10-CM

## 2019-06-22 NOTE — Telephone Encounter (Signed)
-----   Message from Cloyd Stagers, RT sent at 06/21/2019  3:39 PM EDT ----- Regarding: Lab Orders for Friday 10.2.2020 Please place lab orders for Friday 10.2.2020, office visit for physical on Friday 10.9.2020 Thank you, Dyke Maes RT(R)

## 2019-06-29 ENCOUNTER — Telehealth: Payer: Self-pay

## 2019-06-29 NOTE — Telephone Encounter (Signed)
Left detailed VM w COVID screen and back door lab info and front door info   

## 2019-07-01 ENCOUNTER — Other Ambulatory Visit: Payer: Medicare Other

## 2019-07-08 ENCOUNTER — Encounter: Payer: Medicare Other | Admitting: Family Medicine

## 2019-07-08 DIAGNOSIS — Z0289 Encounter for other administrative examinations: Secondary | ICD-10-CM

## 2019-07-23 ENCOUNTER — Other Ambulatory Visit: Payer: Self-pay | Admitting: Family Medicine

## 2019-07-23 DIAGNOSIS — G894 Chronic pain syndrome: Secondary | ICD-10-CM

## 2019-08-01 ENCOUNTER — Other Ambulatory Visit: Payer: Self-pay | Admitting: Family Medicine

## 2019-08-11 DIAGNOSIS — Z471 Aftercare following joint replacement surgery: Secondary | ICD-10-CM | POA: Diagnosis not present

## 2019-08-11 DIAGNOSIS — M67912 Unspecified disorder of synovium and tendon, left shoulder: Secondary | ICD-10-CM | POA: Diagnosis not present

## 2019-08-19 ENCOUNTER — Other Ambulatory Visit: Payer: Self-pay | Admitting: Family Medicine

## 2019-08-19 DIAGNOSIS — E1140 Type 2 diabetes mellitus with diabetic neuropathy, unspecified: Secondary | ICD-10-CM

## 2019-08-23 ENCOUNTER — Other Ambulatory Visit: Payer: Self-pay | Admitting: Family Medicine

## 2019-08-23 DIAGNOSIS — M25512 Pain in left shoulder: Secondary | ICD-10-CM | POA: Diagnosis not present

## 2019-08-24 NOTE — Telephone Encounter (Signed)
Last office visit 11/23/2018 for Surgical Clearance.  CPE scheduled for 09/15/2020.  Meloxicam not on current medication list. This was noted on refill request:  The original prescription was discontinued on 03/12/2019 by Gary Fleet, PA-C for the following reason: Stop Taking at Discharge. Renewing this prescription may not be appropriate.

## 2019-08-29 DIAGNOSIS — M67912 Unspecified disorder of synovium and tendon, left shoulder: Secondary | ICD-10-CM | POA: Diagnosis not present

## 2019-09-05 LAB — HM DIABETES FOOT EXAM

## 2019-09-06 DIAGNOSIS — E119 Type 2 diabetes mellitus without complications: Secondary | ICD-10-CM | POA: Diagnosis not present

## 2019-09-06 DIAGNOSIS — H524 Presbyopia: Secondary | ICD-10-CM | POA: Diagnosis not present

## 2019-09-06 LAB — HM DIABETES EYE EXAM

## 2019-09-08 ENCOUNTER — Other Ambulatory Visit: Payer: Self-pay | Admitting: Family Medicine

## 2019-09-16 ENCOUNTER — Encounter: Payer: Self-pay | Admitting: Family Medicine

## 2019-09-16 ENCOUNTER — Other Ambulatory Visit: Payer: Self-pay

## 2019-09-16 ENCOUNTER — Ambulatory Visit (INDEPENDENT_AMBULATORY_CARE_PROVIDER_SITE_OTHER): Payer: Medicare Other | Admitting: Family Medicine

## 2019-09-16 VITALS — BP 120/70 | HR 99 | Temp 97.8°F | Ht 61.0 in | Wt 151.5 lb

## 2019-09-16 DIAGNOSIS — Z23 Encounter for immunization: Secondary | ICD-10-CM | POA: Diagnosis not present

## 2019-09-16 DIAGNOSIS — Z0000 Encounter for general adult medical examination without abnormal findings: Secondary | ICD-10-CM

## 2019-09-16 DIAGNOSIS — E1140 Type 2 diabetes mellitus with diabetic neuropathy, unspecified: Secondary | ICD-10-CM | POA: Diagnosis not present

## 2019-09-16 DIAGNOSIS — E7800 Pure hypercholesterolemia, unspecified: Secondary | ICD-10-CM

## 2019-09-16 DIAGNOSIS — I10 Essential (primary) hypertension: Secondary | ICD-10-CM | POA: Diagnosis not present

## 2019-09-16 DIAGNOSIS — F332 Major depressive disorder, recurrent severe without psychotic features: Secondary | ICD-10-CM

## 2019-09-16 NOTE — Addendum Note (Signed)
Addended by: Carter Kitten on: 09/16/2019 04:42 PM   Modules accepted: Orders

## 2019-09-16 NOTE — Progress Notes (Signed)
Chief Complaint  Patient presents with  . Medicare Wellness    History of Present Illness: HPI  The patient presents for annual medicare wellness, complete physical and review of chronic health problems. He/She also has the following acute concerns today:  I have personally reviewed the Medicare Annual Wellness questionnaire and have noted 1. The patient's medical and social history 2. Their use of alcohol, tobacco or illicit drugs 3. Their current medications and supplements 4. The patient's functional ability including ADL's, fall risks, home safety risks and hearing or visual             impairment. 5. Diet and physical activities 6. Evidence for depression or mood disorders 7.         Updated provider list Cognitive evaluation was performed and recorded on pt medicare questionnaire form. The patients weight, height, BMI and visual acuity have been recorded in the chart  I have made referrals, counseling and provided education to the patient based review of the above and I have provided the pt with a written personalized care plan for preventive services.   Documentation of this information was scanned into the electronic record under the media tab.  Patient Care Team: [name], MD as PCP - General   Advance directives and end of life planning reviewed in detail with patient and documented in EMR. Patient given handout on advance care directives if needed. HCPOA and living will updated if needed.   Mood followed by psychiatrist PHQ9: 1. Moderate control on current regimen.   Hearing Screening   Method: Audiometry   125Hz 250Hz 500Hz 1000Hz 2000Hz 3000Hz 4000Hz 6000Hz 8000Hz  Right ear:   _0 Left ear:   _1 Vision Screening Comments: Eye Exam on 09/06/2019 with Dr. Rick Duff at [place]  09/16/2019 06/22/2018 06/16/2016 05/20/2016  Falls in the past year? 0 No Yes Yes  Number falls in past yr: - - 2 or more 2 or more   Injury with Fall? - - No No  Risk for fall due to : - - Impaired balance/gait Impaired mobility  Follow up - - Falls evaluation completed;Education provided;Falls prevention discussed Falls evaluation completed;Education provided;Falls prevention discussed   S/P TKA in 02/2019 per Dr. Berenice Primas, ORTHO  Pain is resolved.  Diabetes:  Due for re-eval. Lab Results  Component Value Date   HGBA1C 6.4 (H) 03/04/2019  Using medications without difficulties: Hypoglycemic episodes: none Hyperglycemic episodes:none Feet problems: no ulcer Blood Sugars averaging: FBS 110 eye exam within last year: yes..   Hypertension:   At goal on  no meds. BP Readings from Last 3 Encounters:  09/16/19 120/70  03/12/19 (!) 157/83  03/04/19 (!) 148/88  Using medication without problems or lightheadedness:  none Chest pain with exertion:none Edema:none Short of breath:none Average home BPs: Other issues:  Elevated Cholesterol:  on lipitor due for re-eval. Lab Results  Component Value Date   CHOL 127 06/28/2018   HDL 57.40 06/28/2018   LDLCALC 58 06/28/2018   TRIG 54.0 06/28/2018   CHOLHDL 2 06/28/2018  Using medications without problems: Muscle aches:  Diet compliance: healthy diet Exercise: walking more Other complaints:   This visit occurred during the SARS-CoV-2 public health emergency.  Safety protocols were in place, including screening questions prior to the visit, additional usage of staff PPE, and extensive cleaning of exam room while observing appropriate contact time as indicated for disinfecting  solutions.   COVID 19 screen:  No recent travel or known exposure to COVID19 The patient denies respiratory symptoms of COVID 19 at this time. The importance of social distancing was discussed today.     Review of Systems  Constitutional: Negative for chills and fever.  HENT: Negative for congestion and ear pain.   Eyes: Negative for pain and redness.  Respiratory: Negative for cough and  shortness of breath.   Cardiovascular: Negative for chest pain, palpitations and leg swelling.  Gastrointestinal: Negative for abdominal pain, blood in stool, constipation, diarrhea, nausea and vomiting.  Genitourinary: Negative for dysuria.  Musculoskeletal: Negative for falls and myalgias.  Skin: Negative for rash.  Neurological: Negative for dizziness.  Psychiatric/Behavioral: Negative for depression. The patient is not nervous/anxious.       Past Medical History:  Diagnosis Date  . Anxiety   . Arthritis   . Contact lens/glasses fitting    wears contacts or glasses  . Depression   . Diabetes mellitus without complication (HCC)    No meds  . Diverticulosis   . Family history of adverse reaction to anesthesia    pts sister has severe N/V  . Fibromyalgia   . Gastritis   . GERD (gastroesophageal reflux disease)   . Hemorrhoid   . Hiatal hernia    neuropathy  . Migraine   . Neuropathy   . Panic attacks   . Pneumonia    as an infant    reports that she has never smoked. She has never used smokeless tobacco. She reports that she does not drink alcohol or use drugs.   Current Outpatient Medications:  .  ACCU-CHEK AVIVA PLUS test strip, USE TO CHECK BLOOD SUGAR  THREE TIMES A DAY., Disp: 300 strip, Rfl: 3 .  Accu-Chek Softclix Lancets lancets, USE TO CHECK BLOOD SUGAR  THREE TIMES A DAY., Disp: 300 each, Rfl: 3 .  alendronate (FOSAMAX) 70 MG tablet, TAKE 1 TABLET BY MOUTH  EVERY 7 DAYS WITH A FULL  GLASS OF WATER ON AN EMPTY  STOMACH, Disp: 12 tablet, Rfl: 3 .  ALPRAZolam (XANAX) 0.5 MG tablet, Take 0.5 mg by mouth 3 (three) times daily as needed for anxiety. , Disp: , Rfl:  .  Ascorbic Acid (VITAMIN C PO), Take 1 tablet by mouth 2 (two) times daily., Disp: , Rfl:  .  aspirin 81 MG EC tablet, Take 81 mg by mouth daily. Swallow whole., Disp: , Rfl:  .  atorvastatin (LIPITOR) 40 MG tablet, TAKE 1 TABLET BY MOUTH  DAILY, Disp: 90 tablet, Rfl: 0 .  b complex vitamins capsule, Take 1  capsule by mouth every 3 (three) days. , Disp: , Rfl:  .  Black Pepper-Turmeric (TURMERIC COMPLEX/BLACK PEPPER PO), Take 1 capsule by mouth every Monday, Wednesday, and Friday. , Disp: , Rfl:  .  Blood Glucose Monitoring Suppl (ACCU-CHEK AVIVA PLUS) w/Device KIT, Use to check blood sugar three times a day.  Dx: E11.40, Disp: 1 kit, Rfl: 0 .  buPROPion (WELLBUTRIN XL) 150 MG 24 hr tablet, TAKE 1 TABLET BY MOUTH  DAILY, Disp: 90 tablet, Rfl: 2 .  CALCIUM-VITAMIN D PO, Take 600 mg by mouth 2 (two) times a day. , Disp: , Rfl:  .  doxepin (SINEQUAN) 10 MG capsule, Take 10 mg by mouth at bedtime., Disp: , Rfl:  .  DULoxetine (CYMBALTA) 60 MG capsule, TAKE 2 CAPSULES BY MOUTH  DAILY, Disp: 180 capsule, Rfl: 2 .  fexofenadine (ALLEGRA ALLERGY) 180 MG tablet, Take  1 tablet (180 mg total) by mouth daily., Disp: , Rfl:  .  gabapentin (NEURONTIN) 600 MG tablet, Take 0.5 tablets (300 mg total) by mouth See admin instructions. Take 300 mg by mouth in the morning and after lunch and 1200 mg at bedtime, Disp: 270 tablet, Rfl: 3 .  meloxicam (MOBIC) 15 MG tablet, Take 15 mg by mouth daily. , Disp: , Rfl:  .  Multiple Vitamin (MULTIVITAMIN) tablet, Take 1 tablet by mouth daily., Disp: , Rfl:  .  Omega-3 Fatty Acids (FISH OIL) 1200 MG CAPS, Take 1,200 mg by mouth daily. , Disp: , Rfl:  .  pantoprazole (PROTONIX) 40 MG tablet, TAKE 1 TABLET BY MOUTH  DAILY, Disp: 90 tablet, Rfl: 0 .  tiZANidine (ZANAFLEX) 4 MG tablet, Take 0.5 tablets (2 mg total) by mouth every 8 (eight) hours as needed for muscle spasms., Disp: 40 tablet, Rfl: 0   Observations/Objective: Blood pressure 120/70, pulse 99, temperature 97.8 F (36.6 C), temperature source Temporal, height 5' 1" (1.549 m), weight 151 lb 8 oz (68.7 kg), SpO2 96 %.  Physical Exam Constitutional:      General: She is not in acute distress.    Appearance: Normal appearance. She is well-developed. She is not ill-appearing or toxic-appearing.  HENT:     Head:  Normocephalic.     Right Ear: Hearing, tympanic membrane, ear canal and external ear normal.     Left Ear: Hearing, tympanic membrane, ear canal and external ear normal.     Nose: Nose normal.  Eyes:     General: Lids are normal. Lids are everted, no foreign bodies appreciated.     Conjunctiva/sclera: Conjunctivae normal.     Pupils: Pupils are equal, round, and reactive to light.  Neck:     Thyroid: No thyroid mass or thyromegaly.     Vascular: No carotid bruit.     Trachea: Trachea normal.  Cardiovascular:     Rate and Rhythm: Normal rate and regular rhythm.     Heart sounds: Normal heart sounds, S1 normal and S2 normal. No murmur. No gallop.   Pulmonary:     Effort: Pulmonary effort is normal. No respiratory distress.     Breath sounds: Normal breath sounds. No wheezing, rhonchi or rales.  Abdominal:     General: Bowel sounds are normal. There is no distension or abdominal bruit.     Palpations: Abdomen is soft. There is no fluid wave or mass.     Tenderness: There is no abdominal tenderness. There is no guarding or rebound.     Hernia: No hernia is present.  Musculoskeletal:     Cervical back: Normal range of motion and neck supple.  Lymphadenopathy:     Cervical: No cervical adenopathy.  Skin:    General: Skin is warm and dry.     Findings: No rash.  Neurological:     Mental Status: She is alert.     Cranial Nerves: No cranial nerve deficit.     Sensory: No sensory deficit.  Psychiatric:        Mood and Affect: Mood is not anxious or depressed.        Speech: Speech normal.        Behavior: Behavior normal. Behavior is cooperative.        Judgment: Judgment normal.      Diabetic foot exam: Normal inspection No skin breakdown No calluses  Normal DP pulses Normal sensation to light touch and monofilament Nails normal  Assessment and Plan  The patient's preventative maintenance and recommended screening tests for an annual wellness exam were reviewed in full  today. Brought up to date unless services declined.  Counselled on the importance of diet, exercise, and its role in overall health and mortality. The patient's FH and SH was reviewed, including their home life, tobacco status, and drug and alcohol status.   Vaccine:  Due for flu shot, td, PCV13  PAP 2016 nml pap, neg HPV. Mammogram every 1-2 years. Scheduled for next  COLON:  2019 Dr. Silverio Decamp repeat in 10 years.  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  ESSENTIAL HYPERTENSION, BENIGN At goal on no  Med.  HYPERCHOLESTEROLEMIA Due for re-eval.  Major depressive disorder, recurrent episode, severe  Stable control on current regimen. Followed by psych.    [name], MD                                                                                                                                 3

## 2019-09-16 NOTE — Assessment & Plan Note (Signed)
Stable control on current regimen. Followed by psych.

## 2019-09-16 NOTE — Patient Instructions (Signed)
Please stop at the lab to have labs drawn.  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  Work on low carb, low cholesterol diet.

## 2019-09-16 NOTE — Assessment & Plan Note (Signed)
Due for re-eval. 

## 2019-09-16 NOTE — Assessment & Plan Note (Signed)
At goal on no  Med.

## 2019-09-16 NOTE — Addendum Note (Signed)
Addended by: Ellamae Sia on: 09/16/2019 04:34 PM   Modules accepted: Orders

## 2019-09-17 LAB — LIPID PANEL
Cholesterol: 136 mg/dL (ref <200)
HDL: 53 mg/dL (ref >=50)
LDL Cholesterol (Calc): 66 mg/dL (calc)
Non-HDL Cholesterol (Calc): 83 mg/dL (calc) (ref <130)
Total CHOL/HDL Ratio: 2.6 (calc) (ref <5.0)
Triglycerides: 85 mg/dL (ref <150)

## 2019-09-17 LAB — COMPREHENSIVE METABOLIC PANEL
AG Ratio: 1.8 (calc) (ref 1.0–2.5)
ALT: 19 U/L (ref 6–29)
AST: 18 U/L (ref 10–35)
Albumin: 4.2 g/dL (ref 3.6–5.1)
Alkaline phosphatase (APISO): 90 U/L (ref 37–153)
BUN: 20 mg/dL (ref 7–25)
CO2: 24 mmol/L (ref 20–32)
Calcium: 10.3 mg/dL (ref 8.6–10.4)
Chloride: 108 mmol/L (ref 98–110)
Creat: 0.64 mg/dL (ref 0.50–0.99)
Globulin: 2.3 g/dL (calc) (ref 1.9–3.7)
Glucose, Bld: 117 mg/dL — ABNORMAL HIGH (ref 65–99)
Potassium: 4.3 mmol/L (ref 3.5–5.3)
Sodium: 144 mmol/L (ref 135–146)
Total Bilirubin: 0.4 mg/dL (ref 0.2–1.2)
Total Protein: 6.5 g/dL (ref 6.1–8.1)

## 2019-09-17 LAB — HEMOGLOBIN A1C
Hgb A1c MFr Bld: 6.4 % of total Hgb — ABNORMAL HIGH (ref <5.7)
Mean Plasma Glucose: 137 (calc)
eAG (mmol/L): 7.6 (calc)

## 2019-09-17 LAB — MICROALBUMIN / CREATININE URINE RATIO
Creatinine, Urine: 145 mg/dL (ref 20–275)
Microalb Creat Ratio: 10 mcg/mg creat (ref <30)
Microalb, Ur: 1.4 mg/dL

## 2019-09-21 DIAGNOSIS — Z1231 Encounter for screening mammogram for malignant neoplasm of breast: Secondary | ICD-10-CM | POA: Diagnosis not present

## 2019-09-21 LAB — HM MAMMOGRAPHY

## 2019-09-28 ENCOUNTER — Encounter: Payer: Self-pay | Admitting: Family Medicine

## 2019-10-12 ENCOUNTER — Other Ambulatory Visit: Payer: Self-pay | Admitting: Family Medicine

## 2019-10-12 DIAGNOSIS — G894 Chronic pain syndrome: Secondary | ICD-10-CM

## 2019-11-08 ENCOUNTER — Other Ambulatory Visit: Payer: Self-pay | Admitting: Family Medicine

## 2019-11-09 NOTE — Telephone Encounter (Signed)
Last office visit 09/16/2019 for [place].  Last refilled 12/30/2018 for #270 with 3 refills.  No future appointments.

## 2019-11-22 DIAGNOSIS — M25512 Pain in left shoulder: Secondary | ICD-10-CM | POA: Diagnosis not present

## 2019-11-22 DIAGNOSIS — S46012A Strain of muscle(s) and tendon(s) of the rotator cuff of left shoulder, initial encounter: Secondary | ICD-10-CM | POA: Diagnosis not present

## 2019-11-28 ENCOUNTER — Telehealth: Payer: Self-pay | Admitting: Family Medicine

## 2019-11-28 NOTE — Progress Notes (Signed)
  Chronic Care Management   Outreach Note  11/28/2019 Name: Lisa Odom MRN: [id number] DOB: 1956-01-04  Referred by: [name], MD Reason for referral : No chief complaint on file.   An unsuccessful telephone outreach was attempted today. The patient was referred to the pharmacist for assistance with care management and care coordination.   Follow Up Plan:   [name] [employer] Scheduler

## 2019-12-22 ENCOUNTER — Other Ambulatory Visit: Payer: Self-pay | Admitting: *Deleted

## 2019-12-22 ENCOUNTER — Telehealth: Payer: Self-pay | Admitting: Family Medicine

## 2019-12-22 DIAGNOSIS — E1140 Type 2 diabetes mellitus with diabetic neuropathy, unspecified: Secondary | ICD-10-CM

## 2019-12-22 MED ORDER — MELOXICAM 15 MG PO TABS: 15.0000 mg | ORAL_TABLET | Freq: Every day | ORAL | 0 refills | Status: DC

## 2019-12-22 NOTE — Telephone Encounter (Signed)
Last office visit 09/16/2019 for [place].  Last refilled ?03/01/2019 for #60 with 3 refills.  No future appointments.  Ok to refill?

## 2019-12-23 MED ORDER — ACCU-CHEK GUIDE VI STRP: ORAL_STRIP | 3 refills | Status: DC

## 2019-12-23 MED ORDER — ACCU-CHEK GUIDE CONTROL VI LIQD: 1.0000 | Freq: Every day | 3 refills | Status: DC | PRN

## 2019-12-23 MED ORDER — ACCU-CHEK GUIDE ME W/DEVICE KIT: 1.0000 | PACK | Freq: Three times a day (TID) | 0 refills | Status: DC

## 2019-12-23 NOTE — Telephone Encounter (Signed)
I have went ahead and sent in Rx for Accu-Chek Guide Me meter, test strips and control to [facility].

## 2019-12-23 NOTE — Telephone Encounter (Signed)
Received a call from [employer] to let us know that Accu check aviva plus has been discontinued. Other covered meters under patients plan are Accu check guide me, One touch ultra, One touch verio flex and One touch verio reflect.  Left message for patient to call back to discuss options and be advised

## 2019-12-23 NOTE — Addendum Note (Signed)
Addended by: Carter Kitten on: 12/23/2019 04:42 PM   Modules accepted: Orders

## 2019-12-30 ENCOUNTER — Telehealth: Payer: Self-pay | Admitting: Family Medicine

## 2019-12-30 NOTE — Progress Notes (Signed)
  Chronic Care Management   Outreach Note  12/30/2019 Name: Lisa Odom MRN: [id number] DOB: 1955/10/04  Referred by: [name], MD Reason for referral : No chief complaint on file.   A second unsuccessful telephone outreach was attempted today. The patient was referred to pharmacist for assistance with care management and care coordination.  Follow Up Plan:   [name] [employer] Scheduler

## 2020-01-31 ENCOUNTER — Telehealth: Payer: Self-pay | Admitting: Family Medicine

## 2020-01-31 NOTE — Progress Notes (Signed)
  Chronic Care Management   Note  01/31/2020 Name: Luanna Bloomingdale MRN: [id number] DOB: 1955/12/16  Iniyah Pinera is a 64 y.o. year old female who is a primary care patient of Bedsole, Amy E, MD. I reached out to [place] by phone today in response to a referral sent by Ms. Ivy Lynn Gurney's PCP, [name], MD.   Ms. Lewers was given information about Chronic Care Management services today including:  1. CCM service includes personalized support from designated clinical staff supervised by her physician, including individualized plan of care and coordination with other care providers 2. 24/7 contact phone numbers for assistance for urgent and routine care needs. 3. Service will only be billed when office clinical staff spend 20 minutes or more in a month to coordinate care. 4. Only one practitioner may furnish and bill the service in a calendar month. 5. The patient may stop CCM services at any time (effective at the end of the month) by phone call to the office staff.   Patient agreed to services and verbal consent obtained.    This note is not being shared with the patient for the following reason: To respect privacy (The patient or proxy has requested that the information not be shared).  Follow up plan:   [name] [employer] Scheduler

## 2020-02-11 ENCOUNTER — Other Ambulatory Visit: Payer: Self-pay | Admitting: Family Medicine

## 2020-02-13 NOTE — Telephone Encounter (Signed)
Last office visit 09/16/2019 for Lisa Odom.  Last refilled 12/22/2019 for #90 with no refills.  No future appointments with PCP.

## 2020-02-22 ENCOUNTER — Telehealth: Payer: Self-pay | Admitting: Family Medicine

## 2020-02-22 NOTE — Progress Notes (Signed)
°  Chronic Care Management   Outreach Note  02/22/2020 Name: Lisa Odom MRN: [id number] DOB: Aug 21, 1956  Referred by: [name], MD Reason for referral : No chief complaint on file.   An unsuccessful telephone outreach was attempted today. The patient was referred to the pharmacist for assistance with care management and care coordination.   This note is not being shared with the patient for the following reason: To respect privacy (The patient or proxy has requested that the information not be shared).  Follow Up Plan:   Earney [place] [employer] Scheduler

## 2020-02-24 ENCOUNTER — Telehealth: Payer: Self-pay | Admitting: Family Medicine

## 2020-02-24 ENCOUNTER — Other Ambulatory Visit: Payer: Self-pay

## 2020-02-24 ENCOUNTER — Ambulatory Visit (INDEPENDENT_AMBULATORY_CARE_PROVIDER_SITE_OTHER): Payer: Medicare Other | Admitting: Family Medicine

## 2020-02-24 ENCOUNTER — Encounter: Payer: Self-pay | Admitting: Family Medicine

## 2020-02-24 VITALS — BP 118/72 | HR 79 | Temp 97.1°F | Ht 61.0 in | Wt 159.0 lb

## 2020-02-24 DIAGNOSIS — M79672 Pain in left foot: Secondary | ICD-10-CM | POA: Diagnosis not present

## 2020-02-24 DIAGNOSIS — M79644 Pain in right finger(s): Secondary | ICD-10-CM | POA: Insufficient documentation

## 2020-02-24 DIAGNOSIS — I83811 Varicose veins of right lower extremities with pain: Secondary | ICD-10-CM | POA: Diagnosis not present

## 2020-02-24 DIAGNOSIS — M79645 Pain in left finger(s): Secondary | ICD-10-CM

## 2020-02-24 DIAGNOSIS — M79661 Pain in right lower leg: Secondary | ICD-10-CM | POA: Insufficient documentation

## 2020-02-24 DIAGNOSIS — G8929 Other chronic pain: Secondary | ICD-10-CM | POA: Insufficient documentation

## 2020-02-24 NOTE — Assessment & Plan Note (Signed)
Likely cause of calf pain and itching.  Treat with compression hose and consider referral to vascular if not improving.

## 2020-02-24 NOTE — Patient Instructions (Signed)
Wear compression hose daily for 2-3 months.. if not improving  Call for vascular referral for varicose vein pain.  Wear supportive shoes for left foot sprain. Start home physical therapy.  Wear thumb spica brace on left hand.  Apply OTC voltaren gel 4 times daily to foot and thumbs.  Follow up in  1 month if  Foot and thumbs not improving.  Continue mobic for now.

## 2020-02-24 NOTE — Assessment & Plan Note (Signed)
OA versus Dequervain's.. left greater than right. Treat with topical NSAID and thumb spica splint.  Continue meloxicam but if not improving consider change.

## 2020-02-24 NOTE — Progress Notes (Signed)
Chief Complaint  Patient presents with  . Itch Under Skin on Left Lower Leg  . Foot Pain    Left  . Hand Pain    Bilateral Thumbs  . Calf Pain    Right    History of Present Illness: HPI    64 year old female history of carpal tunnel  presents with new onset  Pain  In left foot, right calf and both hands at base of thumbs bilateral.  1. Bilateral thumb bases: no redness, no heat, no swelling. Ongoing x 4-5 month, worsened Achy with sharp pain. No new numbness, no weakness.  Has some weakness and numbness in left from carpal tunnel. Has tried OTC med, lidocaine patch helps some.  2. Right calf pain intermittently in last few years. No swelling.  3. Left foot across dorsal foot x 1 year.  Has tried different shoes.  Pain with standing and walking.  no redness , oc mildly swollen. Itching in ankle   She is taking mobic daily.. not helping. 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  Safety protocols were in place, including screening questions prior to the visit, additional usage of staff PPE, and extensive cleaning of exam room while observing appropriate contact time as indicated for disinfecting solutions.   COVID 19 screen:  No recent travel or known exposure to COVID19 The patient denies respiratory symptoms of COVID 19 at this time. The importance of social distancing was discussed today.     Review of Systems  Constitutional: Negative for chills and fever.  HENT: Negative for congestion and ear pain.   Eyes: Negative for pain and redness.  Respiratory: Negative for cough and shortness of breath.   Cardiovascular: Negative for chest pain, palpitations and leg swelling.  Gastrointestinal: Negative for abdominal pain, blood in stool, constipation, diarrhea, nausea and vomiting.  Genitourinary: Negative for dysuria.  Musculoskeletal: Negative for falls and myalgias.  Skin: Negative for  rash.  Neurological: Negative for dizziness.  Psychiatric/Behavioral: Negative for depression. The patient is not nervous/anxious.       Past Medical History:  Diagnosis Date  . Anxiety   . Arthritis   . Contact lens/glasses fitting    wears contacts or glasses  . Depression   . Diabetes mellitus without complication (HCC)    No meds  . Diverticulosis   . Family history of adverse reaction to anesthesia    pts sister has severe N/V  . Fibromyalgia   . Gastritis   . GERD (gastroesophageal reflux disease)   . Hemorrhoid   . Hiatal hernia    neuropathy  . Migraine   . Neuropathy   . Panic attacks   . Pneumonia    as an infant    reports that she has never smoked. She has never used smokeless tobacco. She reports that she does not drink alcohol or use drugs.   Current Outpatient Medications:  .  Accu-Chek Softclix Lancets lancets, USE TO CHECK BLOOD SUGAR  THREE TIMES A DAY., Disp: 300 each, Rfl: 3 .  alendronate (FOSAMAX) 70 MG tablet, TAKE 1 TABLET BY MOUTH  EVERY 7 DAYS WITH A FULL  GLASS OF WATER ON AN EMPTY  STOMACH, Disp: 12 tablet, Rfl: 3 .  ALPRAZolam (XANAX) 0.5 MG tablet, Take 0.5 mg by mouth 3 (three) times daily as needed for anxiety. , Disp: , Rfl:  .  Ascorbic Acid (VITAMIN C PO), Take 1 tablet  by mouth 2 (two) times daily., Disp: , Rfl:  .  aspirin 81 MG EC tablet, Take 81 mg by mouth daily. Swallow whole., Disp: , Rfl:  .  atorvastatin (LIPITOR) 40 MG tablet, TAKE 1 TABLET BY MOUTH  DAILY, Disp: 90 tablet, Rfl: 3 .  b complex vitamins capsule, Take 1 capsule by mouth every 3 (three) days. , Disp: , Rfl:  .  Black Pepper-Turmeric (TURMERIC COMPLEX/BLACK PEPPER PO), Take 1 capsule by mouth every Monday, Wednesday, and Friday. , Disp: , Rfl:  .  Blood Glucose Calibration (ACCU-CHEK GUIDE CONTROL) LIQD, 1 each by In Vitro route daily as needed., Disp: 1 each, Rfl: 3 .  Blood Glucose Monitoring Suppl (ACCU-CHEK GUIDE ME) w/Device KIT, 1 each by Does not apply route in  the morning, at noon, and at bedtime., Disp: 1 kit, Rfl: 0 .  buPROPion (WELLBUTRIN XL) 150 MG 24 hr tablet, TAKE 1 TABLET BY MOUTH  DAILY, Disp: 90 tablet, Rfl: 2 .  CALCIUM-VITAMIN D PO, Take 600 mg by mouth 2 (two) times a day. , Disp: , Rfl:  .  dextroamphetamine (DEXTROSTAT) 5 MG tablet, Take 5 mg by mouth 2 (two) times daily., Disp: , Rfl:  .  doxepin (SINEQUAN) 10 MG capsule, Take 10 mg by mouth at bedtime., Disp: , Rfl:  .  DULoxetine (CYMBALTA) 60 MG capsule, TAKE 2 CAPSULES BY MOUTH  DAILY, Disp: 180 capsule, Rfl: 2 .  fexofenadine (ALLEGRA ALLERGY) 180 MG tablet, Take 1 tablet (180 mg total) by mouth daily., Disp: , Rfl:  .  gabapentin (NEURONTIN) 600 MG tablet, TAKE 1/2 TABLET BY MOUTH IN THE MORNING AND AFTER LUNCH THEN TAKE 2 TABLETS AT  BEDTIME, Disp: 270 tablet, Rfl: 3 .  glucose blood (ACCU-CHEK GUIDE) test strip, USE TO CHECK BLOOD SUGAR  THREE TIMES A DAY, Disp: 300 each, Rfl: 3 .  meloxicam (MOBIC) 15 MG tablet, TAKE 1 TABLET BY MOUTH  DAILY, Disp: 90 tablet, Rfl: 3 .  Multiple Vitamin (MULTIVITAMIN) tablet, Take 1 tablet by mouth daily., Disp: , Rfl:  .  Omega-3 Fatty Acids (FISH OIL) 1200 MG CAPS, Take 1,200 mg by mouth daily. , Disp: , Rfl:  .  pantoprazole (PROTONIX) 40 MG tablet, TAKE 1 TABLET BY MOUTH  DAILY, Disp: 90 tablet, Rfl: 3   Observations/Objective: Blood pressure 118/72, pulse 79, temperature (!) 97.1 F (36.2 C), temperature source Temporal, height 5' 1"  (1.549 m), weight 159 lb (72.1 kg), SpO2 96 %.  Physical Exam Constitutional:      General: She is not in acute distress.    Appearance: Normal appearance. She is well-developed. She is not ill-appearing or toxic-appearing.  HENT:     Head: Normocephalic.     Right Ear: Hearing, tympanic membrane, ear canal and external ear normal. Tympanic membrane is not erythematous, retracted or bulging.     Left Ear: Hearing, tympanic membrane, ear canal and external ear normal. Tympanic membrane is not erythematous,  retracted or bulging.     Nose: No mucosal edema or rhinorrhea.     Right Sinus: No maxillary sinus tenderness or frontal sinus tenderness.     Left Sinus: No maxillary sinus tenderness or frontal sinus tenderness.     Mouth/Throat:     Pharynx: Uvula midline.  Eyes:     General: Lids are normal. Lids are everted, no foreign bodies appreciated.     Conjunctiva/sclera: Conjunctivae normal.     Pupils: Pupils are equal, round, and reactive to light.  Neck:  Thyroid: No thyroid mass or thyromegaly.     Vascular: No carotid bruit.     Trachea: Trachea normal.  Cardiovascular:     Rate and Rhythm: Normal rate and regular rhythm.     Pulses: Normal pulses.     Heart sounds: Normal heart sounds, S1 normal and S2 normal. No murmur. No friction rub. No gallop.   Pulmonary:     Effort: Pulmonary effort is normal. No tachypnea or respiratory distress.     Breath sounds: Normal breath sounds. No decreased breath sounds, wheezing, rhonchi or rales.  Abdominal:     General: Bowel sounds are normal.     Palpations: Abdomen is soft.     Tenderness: There is no abdominal tenderness.  Musculoskeletal:     Cervical back: Normal range of motion and neck supple.     Comments: Bilateral calves with varicose veins and spider veins.  ttp over left dorsal foot. Scar in 1st digiti   Bilaterla MCP joint soreness, no redness, no swelling  positive Finkle stein test, positive grind test.  Skin:    General: Skin is warm and dry.     Findings: No rash.  Neurological:     Mental Status: She is alert.  Psychiatric:        Mood and Affect: Mood is not anxious or depressed.        Speech: Speech normal.        Behavior: Behavior normal. Behavior is cooperative.        Thought Content: Thought content normal.        Judgment: Judgment normal.      Assessment and Plan   Varicose veins of right lower extremity with pain Likely cause of calf pain and itching.  Treat with compression hose and consider  referral to vascular if not improving.  Chronic foot pain, left Treat with topical voltaren and home PT.  Right calf pain No sign of DVT, normal pulses.  Bilateral thumb pain OA versus Dequervain's.. left greater than right. Treat with topical NSAID and thumb spica splint.  Continue meloxicam but if not improving consider change.     [name], MD

## 2020-02-24 NOTE — Progress Notes (Signed)
  Chronic Care Management   Outreach Note  02/24/2020 Name: Lisa Odom MRN: [id number] DOB: 07-18-1956  Referred by: [name], MD Reason for referral : No chief complaint on file.   An unsuccessful telephone outreach was attempted today. The patient was referred to the pharmacist for assistance with care management and care coordination.   This note is not being shared with the patient for the following reason: To respect privacy (The patient or proxy has requested that the information not be shared).  Follow Up Plan:   Earney Hamburg Upstream Scheduler

## 2020-02-24 NOTE — Assessment & Plan Note (Signed)
No sign of DVT, normal pulses.

## 2020-02-24 NOTE — Assessment & Plan Note (Signed)
Treat with topical voltaren and home PT.

## 2020-02-29 ENCOUNTER — Telehealth: Payer: Self-pay | Admitting: Family Medicine

## 2020-02-29 NOTE — Progress Notes (Signed)
  Chronic Care Management   Outreach Note  02/29/2020 Name: Alixandra Biba MRN: [id number] DOB: May 12, 1956  Referred by: [name], MD Reason for referral : No chief complaint on file.   An unsuccessful telephone outreach was attempted today. The patient was referred to the pharmacist for assistance with care management and care coordination.   This note is not being shared with the patient for the following reason: To respect privacy (The patient or proxy has requested that the information not be shared).  Follow Up Plan:   Earney Hamburg Upstream Scheduler

## 2020-03-12 ENCOUNTER — Telehealth: Payer: Medicare Other

## 2020-03-27 ENCOUNTER — Encounter: Payer: Self-pay | Admitting: Family Medicine

## 2020-03-27 ENCOUNTER — Ambulatory Visit (INDEPENDENT_AMBULATORY_CARE_PROVIDER_SITE_OTHER): Payer: Medicare Other | Admitting: Family Medicine

## 2020-03-27 ENCOUNTER — Other Ambulatory Visit: Payer: Self-pay

## 2020-03-27 VITALS — BP 120/80 | HR 96 | Temp 98.2°F | Ht 61.0 in | Wt 158.2 lb

## 2020-03-27 DIAGNOSIS — M79661 Pain in right lower leg: Secondary | ICD-10-CM

## 2020-03-27 DIAGNOSIS — M79644 Pain in right finger(s): Secondary | ICD-10-CM | POA: Diagnosis not present

## 2020-03-27 DIAGNOSIS — M79645 Pain in left finger(s): Secondary | ICD-10-CM | POA: Diagnosis not present

## 2020-03-27 DIAGNOSIS — R0981 Nasal congestion: Secondary | ICD-10-CM

## 2020-03-27 MED ORDER — AZELASTINE HCL 0.1 % NA SOLN: 2.0000 | Freq: Two times a day (BID) | NASAL | 12 refills | Status: DC

## 2020-03-27 NOTE — Progress Notes (Signed)
Chief Complaint  Patient presents with  . Follow-up    Hands/Leg/Foot  . Nasal Congestion    History of Present Illness: HPI  64 year old female presents for follow up joint pain.  1. Right calf pain:  At last OV I felt due to varicose veins.. recommended compression hose  No improvement with compression hose x 2 months. Using lidocaine patches Pain is intermittent 5-10/10. Pain greater with walking, standing.  no sweling in legs.   2. Foot sprain: home pt recommended at last OV, supportive shoes  Feet  some better.  3. Thumb pain: recommended thumb spica on left hand and voltaren gel.  Gel has not helped at all. She reports thumb brace helps some.  Still pain in thumbs daily.  4.She reports nasal congestion over a year. She is using allegra.  no sneeze, no ear pain, no ST, Has some post nasal drip Flonase helps for a little   This visit occurred during the SARS-CoV-2 public health emergency.  Safety protocols were in place, including screening questions prior to the visit, additional usage of staff PPE, and extensive cleaning of exam room while observing appropriate contact time as indicated for disinfecting solutions.   COVID 19 screen:  No recent travel or known exposure to COVID19 The patient denies respiratory symptoms of COVID 19 at this time. The importance of social distancing was discussed today.     Review of Systems  Constitutional: Negative for chills and fever.  HENT: Positive for congestion. Negative for ear pain.   Eyes: Negative for pain and redness.  Respiratory: Negative for cough and shortness of breath.   Cardiovascular: Negative for chest pain, palpitations and leg swelling.  Gastrointestinal: Negative for abdominal pain, blood in stool, constipation, diarrhea, nausea and vomiting.  Genitourinary: Negative for dysuria.  Musculoskeletal: Positive for joint pain. Negative for falls and myalgias.  Skin: Negative for rash.  Neurological: Negative  for dizziness.  Psychiatric/Behavioral: Negative for depression. The patient is not nervous/anxious.       Past Medical History:  Diagnosis Date  . Anxiety   . Arthritis   . Contact lens/glasses fitting    wears contacts or glasses  . Depression   . Diabetes mellitus without complication (HCC)    No meds  . Diverticulosis   . Family history of adverse reaction to anesthesia    pts sister has severe N/V  . Fibromyalgia   . Gastritis   . GERD (gastroesophageal reflux disease)   . Hemorrhoid   . Hiatal hernia    neuropathy  . Migraine   . Neuropathy   . Panic attacks   . Pneumonia    as an infant    reports that she has never smoked. She has never used smokeless tobacco. She reports that she does not drink alcohol and does not use drugs.   Current Outpatient Medications:  .  Accu-Chek Softclix Lancets lancets, USE TO CHECK BLOOD SUGAR  THREE TIMES A DAY., Disp: 300 each, Rfl: 3 .  alendronate (FOSAMAX) 70 MG tablet, TAKE 1 TABLET BY MOUTH  EVERY 7 DAYS WITH A FULL  GLASS OF WATER ON AN EMPTY  STOMACH, Disp: 12 tablet, Rfl: 3 .  ALPRAZolam (XANAX) 0.5 MG tablet, Take 0.5 mg by mouth 3 (three) times daily as needed for anxiety. , Disp: , Rfl:  .  amphetamine-dextroamphetamine (ADDERALL) 7.5 MG tablet, Take by mouth., Disp: , Rfl:  .  Ascorbic Acid (VITAMIN C PO), Take 1 tablet by mouth 2 (two) times  daily., Disp: , Rfl:  .  aspirin 81 MG EC tablet, Take 81 mg by mouth daily. Swallow whole., Disp: , Rfl:  .  atorvastatin (LIPITOR) 40 MG tablet, TAKE 1 TABLET BY MOUTH  DAILY, Disp: 90 tablet, Rfl: 3 .  b complex vitamins capsule, Take 1 capsule by mouth every 3 (three) days. , Disp: , Rfl:  .  Black Pepper-Turmeric (TURMERIC COMPLEX/BLACK PEPPER PO), Take 1 capsule by mouth every Monday, Wednesday, and Friday. , Disp: , Rfl:  .  Blood Glucose Calibration (ACCU-CHEK GUIDE CONTROL) LIQD, 1 each by In Vitro route daily as needed., Disp: 1 each, Rfl: 3 .  Blood Glucose Monitoring Suppl  (ACCU-CHEK GUIDE ME) w/Device KIT, 1 each by Does not apply route in the morning, at noon, and at bedtime., Disp: 1 kit, Rfl: 0 .  buPROPion (WELLBUTRIN XL) 150 MG 24 hr tablet, TAKE 1 TABLET BY MOUTH  DAILY, Disp: 90 tablet, Rfl: 2 .  CALCIUM-VITAMIN D PO, Take 600 mg by mouth 2 (two) times a day. , Disp: , Rfl:  .  doxepin (SINEQUAN) 10 MG capsule, Take 10 mg by mouth at bedtime., Disp: , Rfl:  .  DULoxetine (CYMBALTA) 60 MG capsule, TAKE 2 CAPSULES BY MOUTH  DAILY, Disp: 180 capsule, Rfl: 2 .  fexofenadine (ALLEGRA ALLERGY) 180 MG tablet, Take 1 tablet (180 mg total) by mouth daily., Disp: , Rfl:  .  gabapentin (NEURONTIN) 600 MG tablet, TAKE 1/2 TABLET BY MOUTH IN THE MORNING AND AFTER LUNCH THEN TAKE 2 TABLETS AT  BEDTIME, Disp: 270 tablet, Rfl: 3 .  glucose blood (ACCU-CHEK GUIDE) test strip, USE TO CHECK BLOOD SUGAR  THREE TIMES A DAY, Disp: 300 each, Rfl: 3 .  meloxicam (MOBIC) 15 MG tablet, TAKE 1 TABLET BY MOUTH  DAILY, Disp: 90 tablet, Rfl: 3 .  Multiple Vitamin (MULTIVITAMIN) tablet, Take 1 tablet by mouth daily., Disp: , Rfl:  .  Omega-3 Fatty Acids (FISH OIL) 1200 MG CAPS, Take 1,200 mg by mouth daily. , Disp: , Rfl:  .  pantoprazole (PROTONIX) 40 MG tablet, TAKE 1 TABLET BY MOUTH  DAILY, Disp: 90 tablet, Rfl: 3   Observations/Objective: Blood pressure 120/80, pulse 96, temperature 98.2 F (36.8 C), temperature source Temporal, height 5' 1" (1.549 m), weight 158 lb 4 oz (71.8 kg), SpO2 94 %.  Physical Exam Constitutional:      General: She is not in acute distress.    Appearance: Normal appearance. She is well-developed. She is not ill-appearing or toxic-appearing.  HENT:     Head: Normocephalic.     Right Ear: Hearing, tympanic membrane, ear canal and external ear normal. Tympanic membrane is not erythematous, retracted or bulging.     Left Ear: Hearing, tympanic membrane, ear canal and external ear normal. Tympanic membrane is not erythematous, retracted or bulging.     Nose:  No mucosal edema or rhinorrhea.     Right Sinus: No maxillary sinus tenderness or frontal sinus tenderness.     Left Sinus: No maxillary sinus tenderness or frontal sinus tenderness.     Mouth/Throat:     Pharynx: Uvula midline.  Eyes:     General: Lids are normal. Lids are everted, no foreign bodies appreciated.     Conjunctiva/sclera: Conjunctivae normal.     Pupils: Pupils are equal, round, and reactive to light.  Neck:     Thyroid: No thyroid mass or thyromegaly.     Vascular: No carotid bruit.     Trachea: Trachea normal.  Cardiovascular:     Rate and Rhythm: Normal rate and regular rhythm.     Pulses: Normal pulses.     Heart sounds: Normal heart sounds, S1 normal and S2 normal. No murmur heard.  No friction rub. No gallop.      Comments: ttp in right calf Pulmonary:     Effort: Pulmonary effort is normal. No tachypnea or respiratory distress.     Breath sounds: Normal breath sounds. No decreased breath sounds, wheezing, rhonchi or rales.  Abdominal:     General: Bowel sounds are normal.     Palpations: Abdomen is soft.     Tenderness: There is no abdominal tenderness.  Musculoskeletal:     Cervical back: Normal range of motion and neck supple.     Right lower leg: No edema.     Left lower leg: No edema.  Skin:    General: Skin is warm and dry.     Findings: No rash.  Neurological:     Mental Status: She is alert.  Psychiatric:        Mood and Affect: Mood is not anxious or depressed.        Speech: Speech normal.        Behavior: Behavior normal. Behavior is cooperative.        Thought Content: Thought content normal.        Judgment: Judgment normal.      Assessment and Plan Bilateral thumb pain Referral to hand specialist.  Nasal congestion  Start nasal spray for post nasal drip.   Right calf pain Eval with [place] to rule out DVT.       [name], MD

## 2020-03-27 NOTE — Patient Instructions (Signed)
Start nasal spray for post nasal drip. We will call with referral to hand specialist.  We will call to set up [place] of right leg.

## 2020-03-30 ENCOUNTER — Ambulatory Visit (HOSPITAL_COMMUNITY)
Admission: RE | Admit: 2020-03-30 | Discharge: 2020-03-30 | Disposition: A | Discharge status: Home / Self Care | Payer: Medicare Other | Source: Ambulatory Visit | Attending: Family Medicine | Admitting: Family Medicine

## 2020-03-30 ENCOUNTER — Other Ambulatory Visit: Payer: Self-pay

## 2020-03-30 ENCOUNTER — Telehealth: Payer: Self-pay

## 2020-03-30 DIAGNOSIS — M79661 Pain in right lower leg: Secondary | ICD-10-CM | POA: Insufficient documentation

## 2020-03-30 NOTE — Telephone Encounter (Addendum)
Lisa Odom with [facility] called report for [place] Lower extremity for DVT; preliminary  Report negative for DVT and superficial VT. Preliminary report to be in epic shortly; not sure when final report will be done. Lisa Odom is waiting. Dr Diona Browner said is OK for Lisa Odom to leave; can tell Lisa Odom [place] appears negative and if any different after gets final report Lisa Odom will be notified. Lisa Odom voiced understanding and will advise Lisa Odom as instructed.

## 2020-04-09 DIAGNOSIS — M79641 Pain in right hand: Secondary | ICD-10-CM | POA: Insufficient documentation

## 2020-04-09 DIAGNOSIS — M180 Bilateral primary osteoarthritis of first carpometacarpal joints: Secondary | ICD-10-CM | POA: Diagnosis not present

## 2020-04-09 DIAGNOSIS — M79642 Pain in left hand: Secondary | ICD-10-CM | POA: Diagnosis not present

## 2020-04-09 DIAGNOSIS — G5602 Carpal tunnel syndrome, left upper limb: Secondary | ICD-10-CM | POA: Diagnosis not present

## 2020-04-12 DIAGNOSIS — M180 Bilateral primary osteoarthritis of first carpometacarpal joints: Secondary | ICD-10-CM | POA: Diagnosis not present

## 2020-04-12 DIAGNOSIS — M79642 Pain in left hand: Secondary | ICD-10-CM | POA: Diagnosis not present

## 2020-04-12 DIAGNOSIS — M79641 Pain in right hand: Secondary | ICD-10-CM | POA: Diagnosis not present

## 2020-04-12 DIAGNOSIS — M25541 Pain in joints of right hand: Secondary | ICD-10-CM | POA: Diagnosis not present

## 2020-04-13 ENCOUNTER — Telehealth: Payer: Self-pay

## 2020-04-13 ENCOUNTER — Telehealth: Payer: Medicare Other

## 2020-04-13 NOTE — Telephone Encounter (Signed)
Attempted to reach patient for initial CCM visit on 04/13/20 at 10:30 AM by telephone. Left voicemail with contact information for rescheduling. Preliminary chart review completed.   Debbora Dus, PharmD Clinical Pharmacist [facility] at [facility] [phone]

## 2020-04-13 NOTE — Chronic Care Management (AMB) (Deleted)
Chronic Care Management Pharmacy  Name: Lisa Odom  MRN: [id number] DOB: 12-Aug-1956  Chief Complaint/ HPI  Lisa Odom,  64 y.o. , female presents for their Initial CCM visit with the clinical pharmacist via telephone.  PCP : [name], MD  Their chronic conditions include: migraine, HTN, GERD, type 2 diabetes, carpal tunnel, OA, osteoporosis, hypercholesterolemia, MDD, diverticulitis, fibromyalgia   Office Visits:  03/27/20: PCP visit - bilateral thumb pain, refer to hand specialist; start nasal spray for post nasal drip; [place] of right leg   02/24/20: PCP visit - bilateral thumb pain, x 4-5 months, lidocaine patches help some; right calf pain, no swelling, taking mobic daily, not helping; wear compression hose daily for 2-3 months, call vascular is not improving, start home PT, wear supportive shoes, wear thumb brace, apply OTC voltaren gel QID, continue Mobic   09/16/19: AWV - controlled DM, HTN controlled on no medications, re-eval cholesterol, MDD stable followed by psych   Consult Visit:  04/09/20: Orthopedics - numbness and tingling in both hands and left side for 3 months, taking Tylenol and Voltaren gel; recommend nerve conduction, start celebrex continue voltaren  Medications: Outpatient Encounter Medications as of 04/13/2020  Medication Sig  . Accu-Chek Softclix Lancets lancets USE TO CHECK BLOOD SUGAR  THREE TIMES A DAY.  Marland Kitchen alendronate (FOSAMAX) 70 MG tablet TAKE 1 TABLET BY MOUTH  EVERY 7 DAYS WITH A FULL  GLASS OF WATER ON AN EMPTY  STOMACH  . ALPRAZolam (XANAX) 0.5 MG tablet Take 0.5 mg by mouth 3 (three) times daily as needed for anxiety.   Marland Kitchen amphetamine-dextroamphetamine (ADDERALL) 7.5 MG tablet Take by mouth.  . Ascorbic Acid (VITAMIN C PO) Take 1 tablet by mouth 2 (two) times daily.  Marland Kitchen aspirin 81 MG EC tablet Take 81 mg by mouth daily. Swallow whole.  Marland Kitchen atorvastatin (LIPITOR) 40 MG tablet TAKE 1 TABLET BY MOUTH  DAILY  . azelastine (ASTELIN) 0.1  % nasal spray Place 2 sprays into both nostrils 2 (two) times daily. Use in each nostril as directed  . b complex vitamins capsule Take 1 capsule by mouth every 3 (three) days.   . Black Pepper-Turmeric (TURMERIC COMPLEX/BLACK PEPPER PO) Take 1 capsule by mouth every Monday, Wednesday, and Friday.   . Blood Glucose Calibration (ACCU-CHEK GUIDE CONTROL) LIQD 1 each by In Vitro route daily as needed.  . Blood Glucose Monitoring Suppl (ACCU-CHEK GUIDE ME) w/Device KIT 1 each by Does not apply route in the morning, at noon, and at bedtime.  Marland Kitchen buPROPion (WELLBUTRIN XL) 150 MG 24 hr tablet TAKE 1 TABLET BY MOUTH  DAILY  . CALCIUM-VITAMIN D PO Take 600 mg by mouth 2 (two) times a day.   Marland Kitchen doxepin (SINEQUAN) 10 MG capsule Take 10 mg by mouth at bedtime.  . DULoxetine (CYMBALTA) 60 MG capsule TAKE 2 CAPSULES BY MOUTH  DAILY  . fexofenadine (ALLEGRA ALLERGY) 180 MG tablet Take 1 tablet (180 mg total) by mouth daily.  Marland Kitchen gabapentin (NEURONTIN) 600 MG tablet TAKE 1/2 TABLET BY MOUTH IN THE MORNING AND AFTER LUNCH THEN TAKE 2 TABLETS AT  BEDTIME  . glucose blood (ACCU-CHEK GUIDE) test strip USE TO CHECK BLOOD SUGAR  THREE TIMES A DAY  . meloxicam (MOBIC) 15 MG tablet TAKE 1 TABLET BY MOUTH  DAILY  . Multiple Vitamin (MULTIVITAMIN) tablet Take 1 tablet by mouth daily.  . Omega-3 Fatty Acids (FISH OIL) 1200 MG CAPS Take 1,200 mg by mouth daily.   . pantoprazole (  PROTONIX) 40 MG tablet TAKE 1 TABLET BY MOUTH  DAILY   No facility-administered encounter medications on file as of 04/13/2020.    Current Diagnosis/Assessment:  Goals Addressed   None    Hypertension   CMP Latest Ref Rng & Units 09/16/2019 03/04/2019 12/09/2018  Glucose 65 - 99 mg/dL 117(H) 114(H) 109(H)  BUN 7 - 25 mg/dL 20 23 21   Creatinine 0.50 - 0.99 mg/dL 0.64 0.59 0.80  Sodium 135 - 146 mmol/L 144 140 141  Potassium 3.5 - 5.3 mmol/L 4.3 4.0 3.7  Chloride 98 - 110 mmol/L 108 109 107  CO2 20 - 32 mmol/L 24 25 25   Calcium 8.6 - 10.4 mg/dL  10.3 8.6(L) 10.0  Total Protein 6.1 - 8.1 g/dL 6.5 6.9 7.0  Total Bilirubin 0.2 - 1.2 mg/dL 0.4 0.5 0.9  Alkaline Phos 38 - 126 U/L - 75 87  AST 10 - 35 U/L 18 19 27   ALT 6 - 29 U/L 19 23 26    Office blood pressures are: BP Readings from Last 3 Encounters:  03/27/20 120/80  02/24/20 118/72  09/16/19 120/70   BP today is:  {CHL HP UPSTREAM Pharmacist BP [phone]}  Patient has failed these meds in the past:  Patient checks BP at home {CHL HP BP Monitoring Frequency:[phone]} Patient home BP readings are ranging:   Patient is currently {CHL Controlled/[phone]} on the following medications:   No pharmacotherapy  We discussed:  {CHL HP Upstream Pharmacy [phone]}  Plan: Continue {CHL HP Upstream Pharmacy Plans:[id number]}  Hyperlipidemia   LDL goal <   Lipid Panel     Component Value Date/Time   CHOL 136 09/16/2019 1630   CHOL 177 07/31/2014 0834   TRIG 85 09/16/2019 1630   HDL 53 09/16/2019 1630   HDL 51 07/31/2014 0834   LDLCALC 66 09/16/2019 1630    Hepatic Function Latest Ref Rng & Units 09/16/2019 03/04/2019 12/09/2018  Total Protein 6.1 - 8.1 g/dL 6.5 6.9 7.0  Albumin 3.5 - 5.0 g/dL - 4.0 4.3  AST 10 - 35 U/L 18 19 27   ALT 6 - 29 U/L 19 23 26   Alk Phosphatase 38 - 126 U/L - 75 87  Total Bilirubin 0.2 - 1.2 mg/dL 0.4 0.5 0.9     The 10-year ASCVD risk score Mikey Bussing DC Jr., et al., 2013) is: 26.8%   Values used to calculate the score:     Age: 20 years     Sex: Female     Is Non-Hispanic African American: Yes     Diabetic: Yes     Tobacco smoker: No     Systolic Blood Pressure: 952 mmHg     Is BP treated: No     HDL Cholesterol: 53 mg/dL     Total Cholesterol: 136 mg/dL   Patient has failed these meds in past: *** Patient is currently {CHL Controlled/[phone]} on the following medications:  . Atorvastatin 40 mg - 1 tablet daily . Fish oil 1200 mg - 1 daily . Aspirin 81 mg - 1 tablet daily   We  discussed:  {CHL HP Upstream Pharmacy [phone]}  Plan: Continue {CHL HP Upstream Pharmacy WUXLK:[id number]}  Diabetes   Recent Relevant Labs: Lab Results  Component Value Date/Time   HGBA1C 6.4 (H) 09/16/2019 04:30 PM   HGBA1C 6.4 (H) 03/04/2019 10:52 AM   MICROALBUR 1.4 09/16/2019 04:30 PM   MICROALBUR 2.0 (H) 06/28/2018 02:54 PM     Checking BG: {CHL HP Blood Glucose Monitoring Frequency:[phone]}  Recent FBG Readings:  Recent pre-meal BG readings: *** Recent 2hr PP BG readings:  *** Recent HS BG readings: *** Patient has failed these meds in past: *** Patient is currently {CHL Controlled/[phone]} on the following medications:   No pharmacotherapy  Last diabetic Foot exam:  Lab Results  Component Value Date/Time   HMDIABEYEEXA No Retinopathy 09/06/2019 12:00 AM    Last diabetic Eye exam:  Lab Results  Component Value Date/Time   HMDIABFOOTEX done 09/05/2019 12:00 AM    We discussed: {CHL HP Upstream Pharmacy discussion:[phone]}  Plan: Continue {CHL HP Upstream Pharmacy [phone]}   GERD   Patient has failed these meds in past:  Patient is currently controlled on the following medications:   Pantoprazole 40 mg - 1 tablet daily before breakfast   Assessment:  . Symptoms:  . Duration of therapy: . Pertinent history: None of the following noted --> GI bleed, Barrett's esophagus, severe esophagitis, long-term NSAID use or DAPT with additional bleeding risk . Risks of long-term PPI therapy reviewed: B12 deficiency, hypomagnesemia, pneumonia, fractures, enteric infections  Plan: Continue current therapy. Consider PPI taper annually.  Plan: Recommend beginning PPI taper. Will consult with PCP. Monitor symptoms.    MDD/Panic Attacks   Patient has failed these meds in past:  Patient is currently {CHL Controlled/[phone]} on the following medications:   Duloxetine 60 mg -   Doxepin 10 mg -  Bupropion 150  mg 24 hr - 1 daily  Adderall 7.5 mg -  Alprazolam 0.5 mg -    We discussed: > 5 day gap --> doxepin  Zenzedi?  Plan: Continue {CHL HP Upstream Pharmacy [phone]}   Fibromyalgia   Patient has failed these meds in past:  Patient is currently {CHL Controlled/[phone]} on the following medications:   Gabapentin 600 mg - 1/2 BF, 1/2 lunch, 2 at bedtime  Meloxicam 15 mg - 1 tablet daily   We discussed: celecoxib?  Plan: Continue {CHL HP Upstream Pharmacy EFEOF:[id number]}'  Osteoporosis    Patient has failed these meds in past:  Patient is currently {CHL Controlled/[phone]} on the following medications:   Alendronate 70 mg - 1 tablet weekly   Calcium-vitamin D -   We discussed:   Plan: Continue {CHL HP Upstream Pharmacy [phone]}   Allergic Rhinitis   Patient has failed these meds in past:  Patient is currently {CHL Controlled/Uncontrolled:([phone]} on the following medications:   Azelastine 0.1% nasal spray  Allegra 180 mg - 1 tablet daily  We discussed:   Plan: Continue {CHL HP Upstream Pharmacy PQDIY:[id number]}  Medication Management  OTCs: multivitamin  Pharmacy/Benefits: [facility] for maintenance medications   Adherence: refills timely   Social support:  Affordability:  CCM Follow Up:  Debbora Dus, PharmD Clinical Pharmacist [facility] at [facility] [phone]

## 2020-04-17 DIAGNOSIS — M5441 Lumbago with sciatica, right side: Secondary | ICD-10-CM | POA: Diagnosis not present

## 2020-04-17 DIAGNOSIS — M545 Low back pain: Secondary | ICD-10-CM | POA: Diagnosis not present

## 2020-04-17 DIAGNOSIS — M25561 Pain in right knee: Secondary | ICD-10-CM | POA: Diagnosis not present

## 2020-05-11 ENCOUNTER — Telehealth: Payer: Medicare Other

## 2020-05-11 ENCOUNTER — Ambulatory Visit: Payer: Medicare Other

## 2020-05-11 ENCOUNTER — Telehealth: Payer: Self-pay

## 2020-05-11 NOTE — Chronic Care Management (AMB) (Signed)
Chronic Care Management Pharmacy  Name: Lisa Odom  MRN: [id number] DOB: 12-Sep-1956  Chief Complaint/ HPI  Lisa Odom,  64 y.o. , female presents for their Initial CCM visit with the clinical pharmacist via telephone due to COVID-19 Pandemic.  PCP : [name], MD  Their chronic conditions include: migraine, hypertension, diabetes, GERD, allergic rhinitis, osteoarthritis, osteoporosis, hypercholesterolemia, fibromyalgia, major depressive disorder.   Office Visits:  03/27/2020 Diona Browner - start nasal spray for post nasal drip. Referral to hand specialist. Ultrasound of right leg.   02/24/2020 - Bedsole - pain in left foot, right calf and both hands at base bilaterally. Recommend compression hose daily for 2-3 months. Vascular referral if vericose vain pain. Start home PT. Wear thumb spica brace on let hand. OTC voltaren gel qid. Consult Visit:  04/12/2020 - occupational therapy - custom orthosis fits well.   04/09/2020 - [facility] - Have discussed problems with her. Would recommend nerve conduction be performed at this point time. We will place her on Celebrex she does take Prilosec. She is advised to continue using her topical nonsteroidal anti-inflammatory. We will have her see therapy for both hard and soft hand-based CMC splints. We will schedule her for nerve conductions. I will see her back following the nerve conductions. 11/22/2019 - [facility] visit- no note available.  Medications: Outpatient Encounter Medications as of 05/11/2020  Medication Sig  . Accu-Chek Softclix Lancets lancets USE TO CHECK BLOOD SUGAR  THREE TIMES A DAY.  Marland Kitchen alendronate (FOSAMAX) 70 MG tablet TAKE 1 TABLET BY MOUTH  EVERY 7 DAYS WITH A FULL  GLASS OF WATER ON AN EMPTY  STOMACH  . ALPRAZolam (XANAX) 0.5 MG tablet Take 0.5 mg by mouth 3 (three) times daily as needed for anxiety.   Marland Kitchen amphetamine-dextroamphetamine (ADDERALL) 7.5 MG tablet Take 7.5 mg by mouth 2 (two) times daily. Morning  and after lunch  . Ascorbic Acid (VITAMIN C PO) Take 1 tablet by mouth 2 (two) times daily.  Marland Kitchen aspirin 81 MG EC tablet Take 81 mg by mouth daily. Swallow whole.   Marland Kitchen atorvastatin (LIPITOR) 40 MG tablet TAKE 1 TABLET BY MOUTH  DAILY  . azelastine (ASTELIN) 0.1 % nasal spray Place 2 sprays into both nostrils 2 (two) times daily. Use in each nostril as directed  . Blood Glucose Calibration (ACCU-CHEK GUIDE CONTROL) LIQD 1 each by In Vitro route daily as needed.  . Blood Glucose Monitoring Suppl (ACCU-CHEK GUIDE ME) w/Device KIT 1 each by Does not apply route in the morning, at noon, and at bedtime.  Marland Kitchen buPROPion (WELLBUTRIN XL) 150 MG 24 hr tablet TAKE 1 TABLET BY MOUTH  DAILY  . CALCIUM-VITAMIN D PO Take 600 mg by mouth 2 (two) times a day.   . diclofenac Sodium (VOLTAREN) 1 % GEL Apply 2 g topically 4 (four) times daily as needed.  . doxepin (SINEQUAN) 10 MG capsule Take 10 mg by mouth at bedtime as needed.   . DULoxetine (CYMBALTA) 60 MG capsule TAKE 2 CAPSULES BY MOUTH  DAILY  . fexofenadine (ALLEGRA ALLERGY) 180 MG tablet Take 1 tablet (180 mg total) by mouth daily.  Marland Kitchen gabapentin (NEURONTIN) 600 MG tablet TAKE 1/2 TABLET BY MOUTH IN THE MORNING AND AFTER LUNCH THEN TAKE 2 TABLETS AT  BEDTIME  . glucose blood (ACCU-CHEK GUIDE) test strip USE TO CHECK BLOOD SUGAR  THREE TIMES A DAY  . meloxicam (MOBIC) 15 MG tablet TAKE 1 TABLET BY MOUTH  DAILY  . Multiple Vitamin (  MULTIVITAMIN) tablet Take 1 tablet by mouth daily.  . Omega-3 Fatty Acids (FISH OIL) 1200 MG CAPS Take 1,200 mg by mouth daily.   . pantoprazole (PROTONIX) 40 MG tablet TAKE 1 TABLET BY MOUTH  DAILY  . b complex vitamins capsule Take 1 capsule by mouth every 3 (three) days.  (Patient not taking: Reported on 05/11/2020)  . Black Pepper-Turmeric (TURMERIC COMPLEX/BLACK PEPPER PO) Take 1 capsule by mouth every Monday, Wednesday, and Friday.  (Patient not taking: Reported on 05/11/2020)   No facility-administered encounter medications on  file as of 05/11/2020.   Allergies  Allergen Reactions  . Hydrochlorothiazide Rash  . Sulfonamide Derivatives Rash   SDOH Screenings   Alcohol Screen:   . Last Alcohol Screening Score (AUDIT):   Depression (PHQ2-9): Medium Risk  . PHQ-2 Score: 11  Financial Resource Strain:   . Difficulty of Paying Living Expenses:   Food Insecurity: No Food Insecurity  . Worried About Charity fundraiser in the Last Year: Never true  . Ran Out of Food in the Last Year: Never true  Housing: Low Risk   . Last Housing Risk Score: 0  Physical Activity:   . Days of Exercise per Week:   . Minutes of Exercise per Session:   Social Connections:   . Frequency of Communication with Friends and Family:   . Frequency of Social Gatherings with Friends and Family:   . Attends Religious Services:   . Active Member of Clubs or Organizations:   . Attends Archivist Meetings:   Marland Kitchen Marital Status:   Stress:   . Feeling of Stress :   Tobacco Use: Low Risk   . Smoking Tobacco Use: Never Smoker  . Smokeless Tobacco Use: Never Used  Transportation Needs:   . Film/video editor (Medical):   Marland Kitchen Lack of Transportation (Non-Medical):      Current Diagnosis/Assessment:  Goals Addressed            This Visit's Progress   . Pharmacy Care Plan       CARE PLAN ENTRY (see longitudinal plan of care for additional care plan information)  Current Barriers:  . Chronic Disease Management support, education, and care coordination needs related to Hyperlipidemia and Diabetes  Hyperlipidemia Lab Results  Component Value Date/Time   LDLCALC 66 09/16/2019 04:30 PM   . Pharmacist Clinical Goal(s): o Over the next 90 days, patient will work with PharmD and providers to maintain LDL goal < 70 . Current regimen:  o Atorvastatin 40 mg daily  o Aspirin EC 81 mg daily . Interventions: o Reviewed patient's medications and adherence.  . Patient self care activities - Over the next 90 days, patient  will: o Continue to take medications as prescribed.   Diabetes Lab Results  Component Value Date/Time   HGBA1C 6.4 (H) 09/16/2019 04:30 PM   HGBA1C 6.4 (H) 03/04/2019 10:52 AM   . Pharmacist Clinical Goal(s): o Over the next 90 days, patient will work with PharmD and providers to maintain A1c goal <7% . Current regimen:  o Diet and lifestyle . Interventions: o Discussed exercise and healthy diet.  o Reviewed blood sugars at home.  . Patient self care activities - Over the next 90 days, patient will: o Check blood sugar once daily, document, and provide at future appointments o Contact provider with any episodes of hypoglycemia  Medication management . Pharmacist Clinical Goal(s): o Over the next 90 days, patient will work with PharmD and providers to maintain optimal  medication adherence . Current pharmacy: [facility] . Interventions o Comprehensive medication review performed. o Continue current medication management strategy . Patient self care activities - Over the next 90 days, patient will: o Focus on medication adherence by pill box o Take medications as prescribed o Report any questions or concerns to PharmD and/or provider(s)  Initial goal documentation        Diabetes   Recent Relevant Labs: Lab Results  Component Value Date/Time   HGBA1C 6.4 (H) 09/16/2019 04:30 PM   HGBA1C 6.4 (H) 03/04/2019 10:52 AM   MICROALBUR 1.4 09/16/2019 04:30 PM   MICROALBUR 2.0 (H) 06/28/2018 02:54 PM     Checking BG: Daily  Recent FBG Readings: 100-140  Patient has failed these meds in past: none reported Patient is currently controlled on the following medications:   Accu-chek guide testing supplies  Last diabetic Foot exam:  Lab Results  Component Value Date/Time   HMDIABEYEEXA No Retinopathy 09/06/2019 12:00 AM    Last diabetic Eye exam:  Lab Results  Component Value Date/Time   HMDIABFOOTEX done 09/05/2019 12:00 AM     We discussed: diet and exercise  extensively. Patient is unable to exercise due to leg pain/knee replacement. She enjoys eating and knows how to eat. She wants to avoid medication and continue lifestyle modifications to keep blood sugar in goal range. She has gained some weight in the last year and is concerned that her sugars will be elevated. She plans to schedule an appointment for updated labs and visit with Dr. Diona Browner.  Patient states most home blood sugar readings are at or below goal. Patient can typically tell when she has eaten too late or something that increased bp.   Plan  Continue control with diet and exercise and   Hyperlipidemia   LDL goal < 70  Lipid Panel     Component Value Date/Time   CHOL 136 09/16/2019 1630   CHOL 177 07/31/2014 0834   TRIG 85 09/16/2019 1630   HDL 53 09/16/2019 1630   HDL 51 07/31/2014 0834   LDLCALC 66 09/16/2019 1630    Hepatic Function Latest Ref Rng & Units 09/16/2019 03/04/2019 12/09/2018  Total Protein 6.1 - 8.1 g/dL 6.5 6.9 7.0  Albumin 3.5 - 5.0 g/dL - 4.0 4.3  AST 10 - 35 U/L 18 19 27   ALT 6 - 29 U/L 19 23 26   Alk Phosphatase 38 - 126 U/L - 75 87  Total Bilirubin 0.2 - 1.2 mg/dL 0.4 0.5 0.9     The 10-year ASCVD risk score Mikey Bussing DC Jr., et al., 2013) is: 26.8%   Values used to calculate the score:     Age: 2 years     Sex: Female     Is Non-Hispanic African American: Yes     Diabetic: Yes     Tobacco smoker: No     Systolic Blood Pressure: 174 mmHg     Is BP treated: No     HDL Cholesterol: 53 mg/dL     Total Cholesterol: 136 mg/dL   Patient has failed these meds in past: none reported Patient is currently controlled on the following medications:  . Aspirin EC 81 mg daily . Atorvastatin 40 mg daily  We discussed:  diet and exercise extensively. Patient has family history of cholesterol. Has a history of bleeding ulcer. Patient enjoys walking outdoors and hopes to resume once temperature approves.   Plan  Continue current  medications   Osteoarthritis   Patient has failed  these meds in past: celecoxib, hydrocodone-acetaminophen, oxycodone-acetaminophen Patient is currently controlled on the following medications:  . meloxicam 15 mg daily  We discussed:  Patient reports medication helps with symptoms.   Plan  Continue current medications  Depression/Panic Attacks/Anxiety - managed by psychiatry   Patient has failed these meds in past: fluoxetine, Latuda Patient is currently controlled on the following medications:  . Alprazolam 0.25 mg daily prn sleep or anxiety . Bupropion XL 150 mg daily  . Doxepin 10 mg at bedtime . Duloxetine 60 mg daily  We discussed:  Sees a psychiatrist that is not in network with her insurance but she doesn't want to change. She pays a discounted out of pocket rate. Medicare won't cover some of her medications for which makes financial a little tighter. She works to make sure she can get her other medications most affordable through [facility].   Plan  Continue current medications  GERD   Patient has failed these meds in past: sucralfate, lansoprazole Patient is currently controlled on the following medications:  . pantoprazole 40 mg daily  We discussed:  Patient has a history of gastric ulcer. If she misses dose of medication she has immediate breakthrough of symptoms. Discussed history of multiple ulcers.   Plan  Continue current medications   Allergic Rhinitis   Patient has failed these meds in past: n/a Patient is currently controlled on the following medications:  . Azelastine 0.1% 2 sprays into both nostrils bid . Fexofenadine 180 mg daily   We discussed:  Lifestyle modifications discussed like frequent washing sheets, avoiding carpet in bedroom, and casing mattress/pillow.   Plan  Continue current medications   Osteopenia / Osteoporosis   Last DEXA Scan: ~within the last 2 years per patient - not available in chart   VITD  Date Value Ref  Range Status  12/03/2015 24.77 (L) 30.00 - 100.00 ng/mL Final     Patient has failed these meds in past: none reported Patient is currently query controlled on the following medications:  . Alendronate 70 mg weekly . Calcium 600 mg with D twice daily   We discussed:  Recommend [id number] units of vitamin D daily. Recommend 1200 mg of calcium daily from dietary and supplemental sources. Recommend weight-bearing and muscle strengthening exercises for building and maintaining bone density. Discussed determining when last dexa scan was and talking with Dr. Diona Browner at next visit.   Plan  Continue current medications and consider updated Dexa scan.   Health Maintenance   Patient is currently controlled on the following medications:  . Gabapentin 600 mg - 1/2 tablet in the morning and after lunch then 2 tablets at bedtime - neuropathy . Multivitamin daily - general health . Omega 3 fatty acids 1200 mg daily - general health . Amphetamine-dextroamphetamine 7.5 mg bid - attention defficit  We discussed:  [facility] best prices for amphetamine-dextroamphetamine prescription.   Plan  Continue current medications  Vaccines   Reviewed and discussed patient's vaccination history.  Has had both COVID shots.   Immunization History  Administered Date(s) Administered  . Fluad Quad(high Dose 65+) 09/16/2019  . Influenza Inj Mdck Quad With Preservative 10/08/2017  . Influenza Whole 09/06/2009, 08/29/2010  . Influenza,inj,Quad PF,6+ Mos 07/29/2013, 05/22/2015, 06/13/2016, 06/22/2018  . Pneumococcal Conjugate-13 09/16/2019  . Pneumococcal Polysaccharide-23 08/04/2013  . Td 09/29/2008    Plan  Recommended patient receive annual flu vaccine in office.   Medication Management   Pt uses Human resources officer for all medications Uses pill box?  Yes Pt endorses excellent compliance  We discussed: Patient reports excellent adherence to medication. She has a box that she keeps her  medication in.   Plan  Utilize [facility] pharmacy for medication synchronization, packaging and delivery    Follow up: 6 month phone visit

## 2020-05-11 NOTE — Chronic Care Management (AMB) (Signed)
Attempted to reach patient for initial CCM visit on 05/11/20 at 2 PM by telephone. Could not leave voicemail - mailbox is full. Preliminary chart review completed.   Sherre Poot, PharmD, [facility] Clinical Pharmacist [facility] [phone] (office) [phone] (mobile)

## 2020-05-11 NOTE — Patient Instructions (Addendum)
Visit Information  Thank you for your time discussing your medications. I look forward to working with you to achieve your health care goals. Below is a summary of what we talked about during our visit.   Goals Addressed            This Visit's Progress   . Pharmacy Care Plan       CARE PLAN ENTRY (see longitudinal plan of care for additional care plan information)  Current Barriers:  . Chronic Disease Management support, education, and care coordination needs related to Hyperlipidemia and Diabetes  Hyperlipidemia Lab Results  Component Value Date/Time   LDLCALC 66 09/16/2019 04:30 PM   . Pharmacist Clinical Goal(s): o Over the next 90 days, patient will work with PharmD and providers to maintain LDL goal < 70 . Current regimen:  o Atorvastatin 40 mg daily  o Aspirin EC 81 mg daily . Interventions: o Reviewed patient's medications and adherence.  o Patient working to incorporate 20 minutes of exercise in each day via walking or using pedal exerciser.  . Patient self care activities - Over the next 90 days, patient will: o Continue to take medications as prescribed.   Diabetes Lab Results  Component Value Date/Time   HGBA1C 6.4 (H) 09/16/2019 04:30 PM   HGBA1C 6.4 (H) 03/04/2019 10:52 AM   . Pharmacist Clinical Goal(s): o Over the next 90 days, patient will work with PharmD and providers to maintain A1c goal <7% . Current regimen:  o Diet and lifestyle . Interventions: o Discussed exercise and healthy diet.  o Reviewed blood sugars at home.  . Patient self care activities - Over the next 90 days, patient will: o Check blood sugar once daily, document, and provide at future appointments o Contact provider with any episodes of hypoglycemia  Medication management . Pharmacist Clinical Goal(s): o Over the next 90 days, patient will work with PharmD and providers to maintain optimal medication adherence . Current pharmacy: [facility] . Interventions o Comprehensive medication review performed. o Continue current medication management strategy . Patient self care activities - Over the next 90 days, patient will: o Focus on medication adherence by pill box o Take medications as prescribed o Report any questions or concerns to PharmD and/or provider(s)  Initial goal documentation        Lisa Odom was given information about Chronic Care Management services today including:  1. CCM service includes personalized support from designated clinical staff supervised by her physician, including individualized plan of care and coordination with other care providers 2. 24/7 contact phone numbers for assistance for urgent and routine care needs. 3. Standard insurance, coinsurance, copays and deductibles apply for chronic care management only during months in which we provide at least 20 minutes of these services. Most insurances cover these services at 100%, however patients may be responsible for any copay, coinsurance and/or deductible if applicable. This service may help you avoid the need for more expensive face-to-face services. 4. Only one practitioner may furnish and bill the service in a calendar month. 5. The patient may stop CCM services at any time (effective at the end of the month) by phone call to the office staff.  Patient agreed to services and verbal consent obtained.   The patient verbalized understanding of instructions provided today and agreed to receive a mailed copy of patient instruction and/or educational materials. Telephone follow up appointment with pharmacy team member scheduled for: 10/2020  Lisa Odom, PharmD Clinical Pharmacist [facility] [phone] (office) [phone] (mobile)  DASH Eating Plan DASH stands for "Dietary Approaches to Stop Hypertension." The DASH eating plan is a healthy eating plan that has been shown to reduce high blood pressure (hypertension). It may  also reduce your risk for type 2 diabetes, heart disease, and stroke. The DASH eating plan may also help with weight loss. What are tips for following this plan?  General guidelines  Avoid eating more than 2,300 mg (milligrams) of salt (sodium) a day. If you have hypertension, you may need to reduce your sodium intake to 1,500 mg a day.  Limit alcohol intake to no more than 1 drink a day for nonpregnant women and 2 drinks a day for men. One drink equals 12 oz of beer, 5 oz of wine, or 1 oz of hard liquor.  Work with your health care provider to maintain a healthy body weight or to lose weight. Ask what an ideal weight is for you.  Get at least 30 minutes of exercise that causes your heart to beat faster (aerobic exercise) most days of the week. Activities may include walking, swimming, or biking.  Work with your health care provider or diet and nutrition specialist (dietitian) to adjust your eating plan to your individual calorie needs. Reading food labels   Check food labels for the amount of sodium per serving. Choose foods with less than 5 percent of the Daily Value of sodium. Generally, foods with less than 300 mg of sodium per serving fit into this eating plan.  To find whole grains, look for the word "whole" as the first word in the ingredient list. Shopping  Buy products labeled as "low-sodium" or "no salt added."  Buy fresh foods. Avoid canned foods and premade or frozen meals. Cooking  Avoid adding salt when cooking. Use salt-free seasonings or herbs instead of table salt or sea salt. Check with your health care provider or pharmacist before using salt substitutes.  Do not fry foods. Cook foods using healthy methods such as baking, boiling, grilling, and broiling instead.  Cook with heart-healthy oils, such as olive, canola, soybean, or sunflower oil. Meal planning  Eat a balanced diet that includes: ? 5 or more servings of fruits and vegetables each day. At each meal,  try to fill half of your plate with fruits and vegetables. ? Up to 6-8 servings of whole grains each day. ? Less than 6 oz of lean meat, poultry, or fish each day. A 3-oz serving of meat is about the same size as a deck of cards. One egg equals 1 oz. ? 2 servings of low-fat dairy each day. ? A serving of nuts, seeds, or beans 5 times each week. ? Heart-healthy fats. Healthy fats called Omega-3 fatty acids are found in foods such as flaxseeds and coldwater fish, like sardines, salmon, and mackerel.  Limit how much you eat of the following: ? Canned or prepackaged foods. ? Food that is high in trans fat, such as fried foods. ? Food that is high in saturated fat, such as fatty meat. ? Sweets, desserts, sugary drinks, and other foods with added sugar. ? Full-fat dairy products.  Do not salt foods before eating.  Try to eat at least 2 vegetarian meals each week.  Eat more home-cooked food and less restaurant, buffet, and fast food.  When eating at a restaurant, ask that your food be prepared with less salt or no salt, if possible. What foods are recommended? The items listed may not be a  complete list. Talk with your dietitian about what dietary choices are best for you. Grains Whole-grain or whole-wheat bread. Whole-grain or whole-wheat pasta. Casidee Jann rice. [place] Morrow. Bulgur. Whole-grain and low-sodium cereals. Pita bread. Low-fat, low-sodium crackers. Whole-wheat flour tortillas. Vegetables Fresh or frozen vegetables (raw, steamed, roasted, or grilled). Low-sodium or reduced-sodium tomato and vegetable juice. Low-sodium or reduced-sodium tomato sauce and tomato paste. Low-sodium or reduced-sodium canned vegetables. Fruits All fresh, dried, or frozen fruit. Canned fruit in natural juice (without added sugar). Meat and other protein foods Skinless chicken or [place]. Ground chicken or [place]. Pork with fat trimmed off. Fish and seafood. Egg whites. Dried beans, peas, or lentils. Unsalted  nuts, nut butters, and seeds. Unsalted canned beans. Lean cuts of beef with fat trimmed off. Low-sodium, lean deli meat. Dairy Low-fat (1%) or fat-free (skim) milk. Fat-free, low-fat, or reduced-fat cheeses. Nonfat, low-sodium ricotta or cottage cheese. Low-fat or nonfat yogurt. Low-fat, low-sodium cheese. Fats and oils Soft margarine without trans fats. Vegetable oil. Low-fat, reduced-fat, or light mayonnaise and salad dressings (reduced-sodium). Canola, safflower, olive, soybean, and sunflower oils. Avocado. Seasoning and other foods Herbs. Spices. Seasoning mixes without salt. Unsalted popcorn and pretzels. Fat-free sweets. What foods are not recommended? The items listed may not be a complete list. Talk with your dietitian about what dietary choices are best for you. Grains Baked goods made with fat, such as croissants, muffins, or some breads. Dry pasta or rice meal packs. Vegetables Creamed or fried vegetables. Vegetables in a cheese sauce. Regular canned vegetables (not low-sodium or reduced-sodium). Regular canned tomato sauce and paste (not low-sodium or reduced-sodium). Regular tomato and vegetable juice (not low-sodium or reduced-sodium). Angie Fava. Olives. Fruits Canned fruit in a light or heavy syrup. Fried fruit. Fruit in cream or butter sauce. Meat and other protein foods Fatty cuts of meat. Ribs. Fried meat. Berniece Salines. Sausage. Bologna and other processed lunch meats. Salami. Fatback. Hotdogs. Bratwurst. Salted nuts and seeds. Canned beans with added salt. Canned or smoked fish. Whole eggs or egg yolks. Chicken or [place] with skin. Dairy Whole or 2% milk, cream, and half-and-half. Whole or full-fat cream cheese. Whole-fat or sweetened yogurt. Full-fat cheese. Nondairy creamers. Whipped toppings. Processed cheese and cheese spreads. Fats and oils Butter. Stick margarine. Lard. Shortening. Ghee. Bacon fat. Tropical oils, such as coconut, palm kernel, or palm oil. Seasoning and other  foods Salted popcorn and pretzels. Onion salt, garlic salt, seasoned salt, table salt, and sea salt. Worcestershire sauce. Tartar sauce. Barbecue sauce. Teriyaki sauce. Soy sauce, including reduced-sodium. Steak sauce. Canned and packaged gravies. Fish sauce. Oyster sauce. Cocktail sauce. Horseradish that you find on the shelf. Ketchup. Mustard. Meat flavorings and tenderizers. Bouillon cubes. Hot sauce and Tabasco sauce. Premade or packaged marinades. Premade or packaged taco seasonings. Relishes. Regular salad dressings. Where to find more information:  National Heart, Lung, and [place]: https://wilson-eaton.com/  American Heart Association: www.heart.org Summary  The DASH eating plan is a healthy eating plan that has been shown to reduce high blood pressure (hypertension). It may also reduce your risk for type 2 diabetes, heart disease, and stroke.  With the DASH eating plan, you should limit salt (sodium) intake to 2,300 mg a day. If you have hypertension, you may need to reduce your sodium intake to 1,500 mg a day.  When on the DASH eating plan, aim to eat more fresh fruits and vegetables, whole grains, lean proteins, low-fat dairy, and heart-healthy fats.  Work with your health care provider or diet and nutrition specialist (dietitian)  to adjust your eating plan to your individual calorie needs. This information is not intended to replace advice given to you by your health care provider. Make sure you discuss any questions you have with your health care provider. Document Revised: 08/28/2017 Document Reviewed: 09/08/2016 Elsevier Patient Education  2020 [employer].

## 2020-05-11 NOTE — Telephone Encounter (Signed)
Pt called back stating she missed your call

## 2020-05-11 NOTE — Chronic Care Management (AMB) (Deleted)
Chronic Care Management Pharmacy  Name: Emmaleah Meroney  MRN: [id number] DOB: [birth year]  Chief Complaint/ HPI  Lisa Odom,  64 y.o. , female presents for their Initial CCM visit with the clinical pharmacist {CHL HP Upstream Pharm visit WYOV:[id number]}.  PCP : [name], MD  Their chronic conditions include: migraine, hypertension, diabetes, GERD, allergic rhinitis, osteoarthritis, osteoporosis, hypercholesterolemia, fibromyalgia, major depressive disorder.   Office Visits:***  03/27/2020 - [facility] - start nasal spray for post nasal drip. Referral to hand specialist. Ultrasound of right leg.   02/24/2020 - [facility] - pain in left foot, right calf and both hands at base bilaterally. Recommend compression hose daily for 2-3 months. Vascular referral if vericose vain pain. Start home PT. Wear thumb spica brace on let hand. OTC voltaren gel qid. Consult Visit:***  04/12/2020 - occupational therapy - custom orthosis fits well.   04/09/2020 - [facility] - Have discussed problems with her. Would recommend nerve conduction be performed at this point time. We will place her on Celebrex she does take Prilosec. She is advised to continue using her topical nonsteroidal anti-inflammatory. We will have her see therapy for both hard and soft hand-based CMC splints. We will schedule her for nerve conductions. I will see her back following the nerve conductions.  11/22/2019 - [facility] visit - *** Medications: Outpatient Encounter Medications as of 05/11/2020  Medication Sig  . Accu-Chek Softclix Lancets lancets USE TO CHECK BLOOD SUGAR  THREE TIMES A DAY.  Marland Kitchen alendronate (FOSAMAX) 70 MG tablet TAKE 1 TABLET BY MOUTH  EVERY 7 DAYS WITH A FULL  GLASS OF WATER ON AN EMPTY  STOMACH  . ALPRAZolam (XANAX) 0.5 MG tablet Take 0.5 mg by mouth 3 (three) times daily as needed for anxiety.   Marland Kitchen amphetamine-dextroamphetamine (ADDERALL) 7.5 MG tablet Take by mouth.  . Ascorbic Acid (VITAMIN C PO)  Take 1 tablet by mouth 2 (two) times daily.  Marland Kitchen aspirin 81 MG EC tablet Take 81 mg by mouth daily. Swallow whole.  Marland Kitchen atorvastatin (LIPITOR) 40 MG tablet TAKE 1 TABLET BY MOUTH  DAILY  . azelastine (ASTELIN) 0.1 % nasal spray Place 2 sprays into both nostrils 2 (two) times daily. Use in each nostril as directed  . b complex vitamins capsule Take 1 capsule by mouth every 3 (three) days.   . Black Pepper-Turmeric (TURMERIC COMPLEX/BLACK PEPPER PO) Take 1 capsule by mouth every Monday, Wednesday, and Friday.   . Blood Glucose Calibration (ACCU-CHEK GUIDE CONTROL) LIQD 1 each by In Vitro route daily as needed.  . Blood Glucose Monitoring Suppl (ACCU-CHEK GUIDE ME) w/Device KIT 1 each by Does not apply route in the morning, at noon, and at bedtime.  Marland Kitchen buPROPion (WELLBUTRIN XL) 150 MG 24 hr tablet TAKE 1 TABLET BY MOUTH  DAILY  . CALCIUM-VITAMIN D PO Take 600 mg by mouth 2 (two) times a day.   Marland Kitchen doxepin (SINEQUAN) 10 MG capsule Take 10 mg by mouth at bedtime.  . DULoxetine (CYMBALTA) 60 MG capsule TAKE 2 CAPSULES BY MOUTH  DAILY  . fexofenadine (ALLEGRA ALLERGY) 180 MG tablet Take 1 tablet (180 mg total) by mouth daily.  Marland Kitchen gabapentin (NEURONTIN) 600 MG tablet TAKE 1/2 TABLET BY MOUTH IN THE MORNING AND AFTER LUNCH THEN TAKE 2 TABLETS AT  BEDTIME  . glucose blood (ACCU-CHEK GUIDE) test strip USE TO CHECK BLOOD SUGAR  THREE TIMES A DAY  . meloxicam (MOBIC) 15 MG tablet TAKE 1 TABLET BY MOUTH  DAILY  .  Multiple Vitamin (MULTIVITAMIN) tablet Take 1 tablet by mouth daily.  . Omega-3 Fatty Acids (FISH OIL) 1200 MG CAPS Take 1,200 mg by mouth daily.   . pantoprazole (PROTONIX) 40 MG tablet TAKE 1 TABLET BY MOUTH  DAILY   No facility-administered encounter medications on file as of 05/11/2020.   Allergies  Allergen Reactions  . Hydrochlorothiazide Rash  . Sulfonamide Derivatives Rash   SDOH Screenings   Alcohol Screen:   . Last Alcohol Screening Score (AUDIT):   Depression (PHQ2-9): Medium Risk  .  PHQ-2 Score: 11  Financial Resource Strain:   . Difficulty of Paying Living Expenses:   Food Insecurity:   . Worried About Charity fundraiser in the Last Year:   . [place] in the Last Year:   Housing:   . Last Housing Risk Score:   Physical Activity:   . Days of Exercise per Week:   . Minutes of Exercise per Session:   Social Connections:   . Frequency of Communication with Friends and Family:   . Frequency of Social Gatherings with Friends and Family:   . Attends Religious Services:   . Active Member of Clubs or Organizations:   . Attends Archivist Meetings:   Marland Kitchen Marital Status:   Stress:   . Feeling of Stress :   Tobacco Use: Low Risk   . Smoking Tobacco Use: Never Smoker  . Smokeless Tobacco Use: Never Used  Transportation Needs:   . Film/video editor (Medical):   Marland Kitchen Lack of Transportation (Non-Medical):      Current Diagnosis/Assessment:  Goals Addressed            This Visit's Progress   . Pharmacy Care Plan         Diabetes   Recent Relevant Labs: Lab Results  Component Value Date/Time   HGBA1C 6.4 (H) 09/16/2019 04:30 PM   HGBA1C 6.4 (H) 03/04/2019 10:52 AM   MICROALBUR 1.4 09/16/2019 04:30 PM   MICROALBUR 2.0 (H) 06/28/2018 02:54 PM     Checking BG: {CHL HP Blood Glucose Monitoring Frequency:[phone]}  Recent FBG Readings: Recent pre-meal BG readings: *** Recent 2hr PP BG readings:  *** Recent HS BG readings: *** Patient has failed these meds in past: *** Patient is currently {CHL Controlled/[phone]} on the following medications: ***  Accu-chek guide testing supplies  Last diabetic Foot exam:  Lab Results  Component Value Date/Time   HMDIABEYEEXA No Retinopathy 09/06/2019 12:00 AM    Last diabetic Eye exam:  Lab Results  Component Value Date/Time   HMDIABFOOTEX done 09/05/2019 12:00 AM     We discussed: {CHL HP Upstream Pharmacy discussion:[phone]}  Plan  Continue {CHL HP Upstream  Pharmacy Plans:[phone]} and  Hypertension   BP today is:  {CHL HP UPSTREAM Pharmacist BP [phone]}  Office blood pressures are  BP Readings from Last 3 Encounters:  03/27/20 120/80  02/24/20 118/72  09/16/19 120/70    Patient has failed these meds in the past: ***  Patient checks BP at home {CHL HP BP Monitoring Frequency:[phone]}  Patient home BP readings are ranging: ***  We discussed {CHL HP Upstream Pharmacy discussion:[phone]}  Plan  Continue {CHL HP Upstream Pharmacy Plans:[phone]}   Hyperlipidemia   LDL goal < ***  Lipid Panel     Component Value Date/Time   CHOL 136 09/16/2019 1630   CHOL 177 07/31/2014 0834   TRIG 85 09/16/2019 1630   HDL 53 09/16/2019 1630   HDL 51 07/31/2014 0834  [place] 66 09/16/2019 1630    Hepatic Function Latest Ref Rng & Units 09/16/2019 03/04/2019 12/09/2018  Total Protein 6.1 - 8.1 g/dL 6.5 6.9 7.0  Albumin 3.5 - 5.0 g/dL - 4.0 4.3  AST 10 - 35 U/L 18 19 27   ALT 6 - 29 U/L 19 23 26   Alk Phosphatase 38 - 126 U/L - 75 87  Total Bilirubin 0.2 - 1.2 mg/dL 0.4 0.5 0.9     The 10-year ASCVD risk score Mikey Bussing DC Jr., et al., 2013) is: 26.8%   Values used to calculate the score:     Age: 71 years     Sex: Female     Is Non-Hispanic African American: Yes     Diabetic: Yes     Tobacco smoker: No     Systolic Blood Pressure: 992 mmHg     Is BP treated: No     HDL Cholesterol: 53 mg/dL     Total Cholesterol: 136 mg/dL   Patient has failed these meds in past: *** Patient is currently {CHL Controlled/Uncontrolled:[phone]} on the following medications:  . Aspirin EC 81 mg daily . Atorvastatin 40 mg daily  We discussed:  {CHL HP Upstream Pharmacy discussion:[phone]}  Plan  Continue {CHL HP Upstream Pharmacy Plans:[phone]}   Osteoarthritis   Patient has failed these meds in past: celecoxib, hydrocodone-acetaminophen, oxycodone-acetaminophen Patient is currently {CHL  Controlled/Uncontrolled:[phone]} on the following medications:  . meloxicam 15 mg daily  We discussed:  ***  Plan  Continue {CHL HP Upstream Pharmacy EQAST:[id number]}  Depression   Patient has failed these meds in past: fluoxetine, Latuda Patient is currently {CHL Controlled/Uncontrolled:[phone]} on the following medications:  . Alprazolam 0.25 mg daily prn sleep or anxiety . Bupropion XL 150 mg daily  . Doxepin 10 mg at bedtime . Duloxetine 60 mg daily  We discussed:  ***  Plan  Continue {CHL HP Upstream Pharmacy GXQJJ:[id number]}  GERD   Patient has failed these meds in past: ***sucralfate, lansoprazole Patient is currently {CHL Controlled/Uncontrolled:[phone]} on the following medications:  . ***pantoprazole 40 mg daily  We discussed:  Patient has a history of gastric ulcer. ***  Plan  Continue {CHL HP Upstream Pharmacy Plans:[phone]}   ***Allergic Rhinitis   Patient has failed these meds in past: *** Patient is currently {CHL Controlled/Uncontrolled:[phone]} on the following medications:  . Azelastine 0.1% 2 sprays into both nostrils bid . Fexofenadine 180 mg daily   We discussed:  ***  Plan  Continue {CHL HP Upstream Pharmacy Plans:[phone]}   Osteopenia / Osteoporosis   Last DEXA Scan: *** not available in chart  T-Score femoral neck: ***  T-Score total hip: ***  T-Score lumbar spine: ***  T-Score forearm radius: ***  10-year probability of major osteoporotic fracture: ***  10-year probability of hip fracture: ***  VITD  Date Value Ref Range Status  12/03/2015 24.77 (L) 30.00 - 100.00 ng/mL Final     Patient {is;is not an osteoporosis candidate:23886}  Patient has failed these meds in past: *** Patient is currently {CHL Controlled/Uncontrolled:[phone]} on the following medications:  . Alendronate 70 mg weekly . Calcium 600 mg with D twice daily   We discussed:  {Osteoporosis Counseling:23892}  Plan  Continue {CHL  HP Upstream Pharmacy Plans:[phone]}  Health Maintenance   Patient is currently {CHL Controlled/Uncontrolled:[phone]} on the following medications:  . Black pepper-Turmeric supplement every Monday/Wednesday/Friday - *** . Gabapentin 600 mg - 1/2 tablet in the morning and after lunch then 2 tablets at bedtime - *** . Vitamin C bid - *** .  B complex every three days - *** . Multivitamin daily - *** . Omega 3 fatty acids 1200 mg daily - ***  We discussed:  ***  Plan  Continue {CHL HP Upstream Pharmacy PNPYY:[id number]}  Vaccines   Reviewed and discussed patient's vaccination history.    Immunization History  Administered Date(s) Administered  . Fluad Quad(high Dose 65+) 09/16/2019  . Influenza Inj Mdck Quad With Preservative 10/08/2017  . Influenza Whole 09/06/2009, 08/29/2010  . Influenza,inj,Quad PF,6+ Mos 07/29/2013, 05/22/2015, 06/13/2016, 06/22/2018  . Pneumococcal Conjugate-13 09/16/2019  . Pneumococcal Polysaccharide-23 08/04/2013  . Td 09/29/2008    Plan  Recommended patient receive *** vaccine in *** office.   Medication Management   Pt uses Human resources officer for all medications Uses pill box? {Yes or If no, why not?:20788} Pt endorses ***% compliance  We discussed: ***  Plan  {[facility] VAPO:14103}    Follow up: *** month phone visit  ***

## 2020-05-14 NOTE — Telephone Encounter (Signed)
Thank you. I was able successful in call back.

## 2020-05-19 DIAGNOSIS — R0981 Nasal congestion: Secondary | ICD-10-CM | POA: Insufficient documentation

## 2020-05-19 NOTE — Assessment & Plan Note (Signed)
Eval with [place] to rule out DVT.

## 2020-05-19 NOTE — Assessment & Plan Note (Signed)
Referral to hand specialist.

## 2020-05-19 NOTE — Assessment & Plan Note (Signed)
Start nasal spray for post nasal drip.

## 2020-06-26 ENCOUNTER — Ambulatory Visit: Payer: Medicare Other | Admitting: Podiatry

## 2020-06-26 ENCOUNTER — Encounter: Payer: Self-pay | Admitting: Podiatry

## 2020-06-26 ENCOUNTER — Other Ambulatory Visit: Payer: Self-pay | Admitting: Podiatry

## 2020-06-26 ENCOUNTER — Other Ambulatory Visit: Payer: Self-pay

## 2020-06-26 ENCOUNTER — Ambulatory Visit (INDEPENDENT_AMBULATORY_CARE_PROVIDER_SITE_OTHER): Payer: Medicare Other

## 2020-06-26 DIAGNOSIS — M5441 Lumbago with sciatica, right side: Secondary | ICD-10-CM

## 2020-06-26 DIAGNOSIS — M778 Other enthesopathies, not elsewhere classified: Secondary | ICD-10-CM

## 2020-06-26 DIAGNOSIS — M5410 Radiculopathy, site unspecified: Secondary | ICD-10-CM | POA: Diagnosis not present

## 2020-06-26 DIAGNOSIS — M2012 Hallux valgus (acquired), left foot: Secondary | ICD-10-CM

## 2020-06-26 NOTE — Progress Notes (Signed)
Subjective:  Patient ID: Lisa Odom, female    DOB: May 26, 1956,  MRN: [id number] HPI Chief Complaint  Patient presents with  . Foot Pain    Dorsal midfoot left - aching x 6-8 months, no injury, swells some  . Leg Pain    Lateral lower leg right - sharp pain, sometimes radiates into foot, thought could be neuropathy  . New Patient (Initial Visit)    Est pt 09/2016    64 y.o. female presents with the above complaint.   ROS: Denies fever chills nausea vomiting muscle aches pains calf pain back pain chest pain shortness of breath.  Past Medical History:  Diagnosis Date  . Anxiety   . Arthritis   . Contact lens/glasses fitting    wears contacts or glasses  . Depression   . Diabetes mellitus without complication (HCC)    No meds  . Diverticulosis   . Family history of adverse reaction to anesthesia    pts sister has severe N/V  . Fibromyalgia   . Gastritis   . GERD (gastroesophageal reflux disease)   . Hemorrhoid   . Hiatal hernia    neuropathy  . Migraine   . Neuropathy   . Panic attacks   . Pneumonia    as an infant   Past Surgical History:  Procedure Laterality Date  . ANTERIOR CERVICAL DECOMP/DISCECTOMY FUSION N/A 09/12/2014   Procedure: Evacuation of cervical hematoma;  Surgeon: [name], MD;  Location: [facility];  Service: Neurosurgery;  Laterality: N/A;  . ANTERIOR CERVICAL DECOMP/DISCECTOMY FUSION N/A 09/12/2014   Procedure: CERVICAL FOUR-FIVE, CERVICAL FIVE-SIX, CERVICAL SIX-SEVEN ANTERIOR CERVICAL DECOMPRESSION/DISCECTOMY FUSION;  Surgeon: [name], MD;  Location: [facility];  Service: Neurosurgery;  Laterality: N/A;  . BUNIONECTOMY    . CARPAL TUNNEL RELEASE  2002   right  . COLONOSCOPY    . JOINT REPLACEMENT Left   . SHOULDER ARTHROSCOPY WITH ROTATOR CUFF REPAIR  2010   right  . TOTAL HIP ARTHROPLASTY Left 11/20/2017   Procedure: LEFT TOTAL HIP ARTHROPLASTY ANTERIOR APPROACH WITH BONE GRAFTING AND ACETABULAR SCREWS;  Surgeon: Dorna Leitz, MD;  Location: [facility];  Service: Orthopedics;  Laterality: Left;  . TOTAL KNEE ARTHROPLASTY Right 03/11/2019   Procedure: TOTAL KNEE ARTHROPLASTY;  Surgeon: Dorna Leitz, MD;  Location: [facility];  Service: Orthopedics;  Laterality: Right;  . TRIGGER FINGER RELEASE Right 03/23/2013   Procedure: RELEASE TRIGGER FINGER/A-1 PULLEY RIGHT RING FINGER;  Surgeon: [name], MD;  Location: [facility];  Service: Orthopedics;  Laterality: Right;  . TUBAL LIGATION    . UPPER GI ENDOSCOPY      Current Outpatient Medications:  .  Accu-Chek Softclix Lancets lancets, USE TO CHECK BLOOD SUGAR  THREE TIMES A DAY., Disp: 300 each, Rfl: 3 .  alendronate (FOSAMAX) 70 MG tablet, TAKE 1 TABLET BY MOUTH  EVERY 7 DAYS WITH A FULL  GLASS OF WATER ON AN EMPTY  STOMACH, Disp: 12 tablet, Rfl: 3 .  ALPRAZolam (XANAX) 0.5 MG tablet, Take 0.5 mg by mouth 3 (three) times daily as needed for anxiety. , Disp: , Rfl:  .  amphetamine-dextroamphetamine (ADDERALL) 7.5 MG tablet, Take 7.5 mg by mouth 2 (two) times daily. Morning and after lunch, Disp: , Rfl:  .  Ascorbic Acid (VITAMIN C PO), Take 1 tablet by mouth 2 (two) times daily., Disp: , Rfl:  .  aspirin 81 MG EC tablet, Take 81 mg by mouth daily. Swallow whole. , Disp: ,  Rfl:  .  atorvastatin (LIPITOR) 40 MG tablet, TAKE 1 TABLET BY MOUTH  DAILY, Disp: 90 tablet, Rfl: 3 .  azelastine (ASTELIN) 0.1 % nasal spray, Place 2 sprays into both nostrils 2 (two) times daily. Use in each nostril as directed, Disp: 30 mL, Rfl: 12 .  b complex vitamins capsule, Take 1 capsule by mouth every 3 (three) days.  (Patient not taking: Reported on 05/11/2020), Disp: , Rfl:  .  Black Pepper-Turmeric (TURMERIC COMPLEX/BLACK PEPPER PO), Take 1 capsule by mouth every Monday, Wednesday, and Friday.  (Patient not taking: Reported on 05/11/2020), Disp: , Rfl:  .  Blood Glucose Calibration (ACCU-CHEK GUIDE CONTROL) LIQD, 1 each by In Vitro route daily as needed., Disp: 1 each, Rfl: 3 .   Blood Glucose Monitoring Suppl (ACCU-CHEK GUIDE ME) w/Device KIT, 1 each by Does not apply route in the morning, at noon, and at bedtime., Disp: 1 kit, Rfl: 0 .  buPROPion (WELLBUTRIN XL) 150 MG 24 hr tablet, TAKE 1 TABLET BY MOUTH  DAILY, Disp: 90 tablet, Rfl: 2 .  CALCIUM-VITAMIN D PO, Take 600 mg by mouth 2 (two) times a day. , Disp: , Rfl:  .  celecoxib (CELEBREX) 200 MG capsule, Take 200 mg by mouth daily., Disp: , Rfl:  .  diclofenac Sodium (VOLTAREN) 1 % GEL, Apply 2 g topically 4 (four) times daily as needed., Disp: , Rfl:  .  doxepin (SINEQUAN) 10 MG capsule, Take 10 mg by mouth at bedtime as needed. , Disp: , Rfl:  .  DULoxetine (CYMBALTA) 60 MG capsule, TAKE 2 CAPSULES BY MOUTH  DAILY, Disp: 180 capsule, Rfl: 2 .  fexofenadine (ALLEGRA ALLERGY) 180 MG tablet, Take 1 tablet (180 mg total) by mouth daily., Disp: , Rfl:  .  gabapentin (NEURONTIN) 600 MG tablet, TAKE 1/2 TABLET BY MOUTH IN THE MORNING AND AFTER LUNCH THEN TAKE 2 TABLETS AT  BEDTIME, Disp: 270 tablet, Rfl: 3 .  glucose blood (ACCU-CHEK GUIDE) test strip, USE TO CHECK BLOOD SUGAR  THREE TIMES A DAY, Disp: 300 each, Rfl: 3 .  meloxicam (MOBIC) 15 MG tablet, TAKE 1 TABLET BY MOUTH  DAILY, Disp: 90 tablet, Rfl: 3 .  Multiple Vitamin (MULTIVITAMIN) tablet, Take 1 tablet by mouth daily., Disp: , Rfl:  .  Omega-3 Fatty Acids (FISH OIL) 1200 MG CAPS, Take 1,200 mg by mouth daily. , Disp: , Rfl:  .  pantoprazole (PROTONIX) 40 MG tablet, TAKE 1 TABLET BY MOUTH  DAILY, Disp: 90 tablet, Rfl: 3  Allergies  Allergen Reactions  . Hydrochlorothiazide Rash  . Sulfonamide Derivatives Rash   Review of Systems Objective:  There were no vitals filed for this visit.  General: Well developed, nourished, in no acute distress, alert and oriented x3   Dermatological: Skin is warm, dry and supple bilateral. Nails x 10 are well maintained; remaining integument appears unremarkable at this time. There are no open sores, no preulcerative lesions,  no rash or signs of infection present.  Vascular: Dorsalis Pedis artery and Posterior Tibial artery pedal pulses are 2/4 bilateral with immedate capillary fill time. Pedal hair growth present. No varicosities and no lower extremity edema present bilateral.   Neruologic: Grossly intact via light touch bilateral. Vibratory intact via tuning fork bilateral. Protective threshold with Semmes Wienstein monofilament intact to all pedal sites bilateral. Patellar and Achilles deep tendon reflexes 2+ bilateral. No Babinski or clonus noted bilateral.  She has an area of radiating pain along the lateral aspect of her leg from  her common peroneal nerve distally.  She also has lower right-sided back pain and right-sided sciatica.  Musculoskeletal: No gross boney pedal deformities bilateral. No pain, crepitus, or limitation noted with foot and ankle range of motion bilateral. Muscular strength 5/5 in all groups tested bilateral.  An iatrogenic hallux varus that appears to have developed after her bunionectomy.  She also has a rigid hammertoe deformity.  Neither of these are bothering her however she is experiencing pain to the dorsal aspect of the bilateral foot which is does have palpable ridging across the tarsometatarsal joints consistent with osteoarthritis.  Gait: Unassisted, Nonantalgic.    Radiographs:  Radiographs taken today demonstrate an osseously mature foot bilaterally with osteopenia.  Screws to the second toe left in the first metatarsal with hallux varus deformity left hallux.  She also has hallux valgus deformity on the right foot both feet demonstrate osteoarthritis of the tarsometatarsal joints bilaterally.  Assessment & Plan:   Assessment: Sciatica and most likely radiculopathy associated with pain.  She also has osteoarthritis midfoot bilateral.  Refer  Plan: We will to neurosurgery right.  Injected the dorsal aspect of the bilateral foot tarsometatarsal joints.     Ema Hebner T. Duarte, [place]

## 2020-07-05 DIAGNOSIS — M5416 Radiculopathy, lumbar region: Secondary | ICD-10-CM | POA: Diagnosis not present

## 2020-07-16 ENCOUNTER — Other Ambulatory Visit: Payer: Self-pay | Admitting: Family Medicine

## 2020-07-17 NOTE — Telephone Encounter (Signed)
Last OV 03/27/20 Last fill 08/24/19  #300/3

## 2020-07-19 DIAGNOSIS — M5126 Other intervertebral disc displacement, lumbar region: Secondary | ICD-10-CM | POA: Diagnosis not present

## 2020-07-19 DIAGNOSIS — M48061 Spinal stenosis, lumbar region without neurogenic claudication: Secondary | ICD-10-CM | POA: Diagnosis not present

## 2020-07-19 DIAGNOSIS — M5416 Radiculopathy, lumbar region: Secondary | ICD-10-CM | POA: Diagnosis not present

## 2020-08-02 DIAGNOSIS — M4310 Spondylolisthesis, site unspecified: Secondary | ICD-10-CM | POA: Diagnosis not present

## 2020-08-06 DIAGNOSIS — M5416 Radiculopathy, lumbar region: Secondary | ICD-10-CM | POA: Diagnosis not present

## 2020-08-08 DIAGNOSIS — M5416 Radiculopathy, lumbar region: Secondary | ICD-10-CM | POA: Diagnosis not present

## 2020-08-08 DIAGNOSIS — R030 Elevated blood-pressure reading, without diagnosis of hypertension: Secondary | ICD-10-CM | POA: Diagnosis not present

## 2020-09-05 ENCOUNTER — Other Ambulatory Visit: Payer: Self-pay | Admitting: Family Medicine

## 2020-09-05 DIAGNOSIS — G894 Chronic pain syndrome: Secondary | ICD-10-CM

## 2020-09-06 DIAGNOSIS — H5203 Hypermetropia, bilateral: Secondary | ICD-10-CM | POA: Diagnosis not present

## 2020-09-06 DIAGNOSIS — E119 Type 2 diabetes mellitus without complications: Secondary | ICD-10-CM | POA: Diagnosis not present

## 2020-09-06 DIAGNOSIS — H524 Presbyopia: Secondary | ICD-10-CM | POA: Diagnosis not present

## 2020-09-06 LAB — HM DIABETES EYE EXAM

## 2020-09-07 DIAGNOSIS — M5416 Radiculopathy, lumbar region: Secondary | ICD-10-CM | POA: Diagnosis not present

## 2020-09-12 ENCOUNTER — Telehealth: Payer: Self-pay | Admitting: Family Medicine

## 2020-09-12 DIAGNOSIS — E1140 Type 2 diabetes mellitus with diabetic neuropathy, unspecified: Secondary | ICD-10-CM

## 2020-09-12 NOTE — Telephone Encounter (Signed)
-----   Message from Cloyd Stagers, RT sent at 09/11/2020 10:23 AM EST ----- Regarding: Lab Orders for Monday 12.27.2021 Please place lab orders for Monday 12.27.2021, office visit for physical on Tuesday 1.4.2022 Thank you, Dyke Maes RT(R)

## 2020-09-14 ENCOUNTER — Ambulatory Visit: Payer: Medicare Other | Attending: Internal Medicine

## 2020-09-14 DIAGNOSIS — Z23 Encounter for immunization: Secondary | ICD-10-CM

## 2020-09-14 NOTE — Progress Notes (Signed)
   Covid-19 Vaccination Clinic  Name:  Lisa Odom    MRN: [id number] DOB: November 18, 1955  09/14/2020  [name] was observed post Covid-19 immunization for 15 minutes without incident. She was provided with Vaccine Information Sheet and instruction to access the V-Safe system.   [name] was instructed to call 911 with any severe reactions post vaccine: Marland Kitchen Difficulty breathing  . Swelling of face and throat  . A fast heartbeat  . A bad rash all over body  . Dizziness and weakness   Immunizations Administered    Name Date Dose VIS Date Route   Pfizer COVID-19 Vaccine 09/14/2020  3:21 PM 0.3 mL 07/18/2020 Intramuscular   Manufacturer: [place]   Lot: [id number]   [place]: [id number]

## 2020-09-24 ENCOUNTER — Other Ambulatory Visit: Payer: Medicare Other

## 2020-09-30 ENCOUNTER — Other Ambulatory Visit: Payer: Self-pay | Admitting: Family Medicine

## 2020-10-01 NOTE — Telephone Encounter (Signed)
Last office visit 03/27/2020 for bilateral thumb pain/right calf pain/nasal congestion.  Last refilled 11/09/2019 for #270 with 3 refills.  CPE scheduled for 10/26/2020.

## 2020-10-02 ENCOUNTER — Encounter: Payer: Medicare Other | Admitting: Family Medicine

## 2020-10-16 ENCOUNTER — Telehealth: Payer: Self-pay | Admitting: *Deleted

## 2020-10-16 NOTE — Telephone Encounter (Signed)
Patient left a voicemail stating that she was having symptoms and went for covid testing Friday. Patient stated that she got negative test results back. Patient stated that she has a lot of head and chest congestion and wants to talk to someone. Called patient back and got her voicemail. Patient was advised to call the office back.

## 2020-10-18 ENCOUNTER — Telehealth: Payer: Medicare Other | Admitting: Family Medicine

## 2020-10-18 ENCOUNTER — Other Ambulatory Visit (INDEPENDENT_AMBULATORY_CARE_PROVIDER_SITE_OTHER): Payer: Medicare Other

## 2020-10-18 ENCOUNTER — Encounter: Payer: Self-pay | Admitting: Family Medicine

## 2020-10-18 ENCOUNTER — Other Ambulatory Visit: Payer: Self-pay

## 2020-10-18 VITALS — Ht 61.0 in

## 2020-10-18 DIAGNOSIS — E1140 Type 2 diabetes mellitus with diabetic neuropathy, unspecified: Secondary | ICD-10-CM | POA: Diagnosis not present

## 2020-10-18 LAB — COMPREHENSIVE METABOLIC PANEL
ALT: 19 U/L (ref 0–35)
AST: 16 U/L (ref 0–37)
Albumin: 4.2 g/dL (ref 3.5–5.2)
Alkaline Phosphatase: 84 U/L (ref 39–117)
BUN: 16 mg/dL (ref 6–23)
CO2: 30 mEq/L (ref 19–32)
Calcium: 9.1 mg/dL (ref 8.4–10.5)
Chloride: 105 mEq/L (ref 96–112)
Creatinine, Ser: 0.74 mg/dL (ref 0.40–1.20)
GFR: 84.03 mL/min (ref >60.00)
Glucose, Bld: 130 mg/dL — ABNORMAL HIGH (ref 70–99)
Potassium: 3.8 mEq/L (ref 3.5–5.1)
Sodium: 140 mEq/L (ref 135–145)
Total Bilirubin: 0.5 mg/dL (ref 0.2–1.2)
Total Protein: 7.2 g/dL (ref 6.0–8.3)

## 2020-10-18 LAB — LIPID PANEL
Cholesterol: 115 mg/dL (ref 0–200)
HDL: 43.5 mg/dL (ref >39.00)
LDL Cholesterol: 53 mg/dL (ref 0–99)
NonHDL: 71.25
Total CHOL/HDL Ratio: 3
Triglycerides: 90 mg/dL (ref 0.0–149.0)
VLDL: 18 mg/dL (ref 0.0–40.0)

## 2020-10-18 LAB — MICROALBUMIN / CREATININE URINE RATIO
Creatinine,U: 169.9 mg/dL
Microalb Creat Ratio: 0.9 mg/g (ref 0.0–30.0)
Microalb, Ur: 1.5 mg/dL (ref 0.0–1.9)

## 2020-10-18 LAB — HEMOGLOBIN A1C: Hgb A1c MFr Bld: 7.3 % — ABNORMAL HIGH (ref 4.6–6.5)

## 2020-10-18 NOTE — Addendum Note (Signed)
Addended by: Cloyd Stagers on: 10/18/2020 08:53 AM   Modules accepted: Orders

## 2020-10-18 NOTE — Progress Notes (Signed)
Patient NO Showed for appointment.. tried to reach her multiple times without success.   Chief Complaint  Patient presents with  . Nasal Congestion    Yellow mucus sometimes with blood tinge  . Cough    From sinus drainage    History of Present Illness:   COVID 19 screen COVID testing:none COVID vaccine:09/14/2020 , 01/03/2020 , 12/12/2019 COVID exposure: No recent travel or known exposure to [place]  The importance of social distancing was discussed today.    ROS    Past Medical History:  Diagnosis Date  . Anxiety   . Arthritis   . Contact lens/glasses fitting    wears contacts or glasses  . Depression   . Diabetes mellitus without complication (HCC)    No meds  . Diverticulosis   . Family history of adverse reaction to anesthesia    pts sister has severe N/V  . Fibromyalgia   . Gastritis   . GERD (gastroesophageal reflux disease)   . Hemorrhoid   . Hiatal hernia    neuropathy  . Migraine   . Neuropathy   . Panic attacks   . Pneumonia    as an infant    reports that she has never smoked. She has never used smokeless tobacco. She reports that she does not drink alcohol and does not use drugs.   Current Outpatient Medications:  .  Accu-Chek Softclix Lancets lancets, USE TO CHECK BLOOD SUGAR  THREE TIMES A DAY., Disp: 300 each, Rfl: 3 .  ALPRAZolam (XANAX) 0.5 MG tablet, Take 0.5 mg by mouth 3 (three) times daily as needed for anxiety. , Disp: , Rfl:  .  amphetamine-dextroamphetamine (ADDERALL) 7.5 MG tablet, Take 7.5 mg by mouth 2 (two) times daily. Morning and after lunch, Disp: , Rfl:  .  Ascorbic Acid (VITAMIN C PO), Take 1 tablet by mouth 2 (two) times daily., Disp: , Rfl:  .  aspirin 81 MG EC tablet, Take 81 mg by mouth daily. Swallow whole. , Disp: , Rfl:  .  atorvastatin (LIPITOR) 40 MG tablet, TAKE 1 TABLET BY MOUTH  DAILY, Disp: 90 tablet, Rfl: 0 .  azelastine (ASTELIN) 0.1 % nasal spray, Place 2 sprays into both nostrils 2 (two) times daily. Use in each  nostril as directed, Disp: 30 mL, Rfl: 12 .  b complex vitamins capsule, Take 1 capsule by mouth every 3 (three) days., Disp: , Rfl:  .  Blood Glucose Calibration (ACCU-CHEK GUIDE CONTROL) LIQD, 1 each by In Vitro route daily as needed., Disp: 1 each, Rfl: 3 .  Blood Glucose Monitoring Suppl (ACCU-CHEK GUIDE ME) w/Device KIT, 1 each by Does not apply route in the morning, at noon, and at bedtime., Disp: 1 kit, Rfl: 0 .  buPROPion (WELLBUTRIN XL) 150 MG 24 hr tablet, TAKE 1 TABLET BY MOUTH  DAILY, Disp: 90 tablet, Rfl: 2 .  CALCIUM-VITAMIN D PO, Take 600 mg by mouth 2 (two) times a day. , Disp: , Rfl:  .  celecoxib (CELEBREX) 200 MG capsule, Take 200 mg by mouth daily., Disp: , Rfl:  .  diclofenac Sodium (VOLTAREN) 1 % GEL, Apply 2 g topically 4 (four) times daily as needed., Disp: , Rfl:  .  DULoxetine (CYMBALTA) 60 MG capsule, TAKE 2 CAPSULES BY MOUTH  DAILY, Disp: 180 capsule, Rfl: 2 .  fexofenadine (ALLEGRA ALLERGY) 180 MG tablet, Take 1 tablet (180 mg total) by mouth daily., Disp: , Rfl:  .  gabapentin (NEURONTIN) 600 MG tablet, TAKE 1/2 TABLET BY MOUTH  IN THE MORNING AND AFTER LUNCH THEN TAKE 2 TABLETS AT  BEDTIME, Disp: 270 tablet, Rfl: 3 .  glucose blood (ACCU-CHEK GUIDE) test strip, USE TO CHECK BLOOD SUGAR  THREE TIMES A DAY, Disp: 300 each, Rfl: 3 .  meloxicam (MOBIC) 15 MG tablet, TAKE 1 TABLET BY MOUTH  DAILY, Disp: 90 tablet, Rfl: 3 .  Multiple Vitamin (MULTIVITAMIN) tablet, Take 1 tablet by mouth daily., Disp: , Rfl:  .  Omega-3 Fatty Acids (FISH OIL) 1200 MG CAPS, Take 1,200 mg by mouth daily. , Disp: , Rfl:  .  pantoprazole (PROTONIX) 40 MG tablet, TAKE 1 TABLET BY MOUTH  DAILY, Disp: 90 tablet, Rfl: 0   Observations/Objective: Height 5' 1"  (1.549 m).  Physical Exam   Assessment and Plan    I discussed the assessment and treatment plan with the patient. The patient was provided an opportunity to ask questions and all were answered. The patient agreed with the plan and  demonstrated an understanding of the instructions.   The patient was advised to call back or seek an in-person evaluation if the symptoms worsen or if the condition fails to improve as anticipated.     [name], MD

## 2020-10-19 ENCOUNTER — Other Ambulatory Visit: Payer: Self-pay

## 2020-10-19 ENCOUNTER — Encounter: Payer: Self-pay | Admitting: Family Medicine

## 2020-10-19 ENCOUNTER — Telehealth (INDEPENDENT_AMBULATORY_CARE_PROVIDER_SITE_OTHER): Payer: Medicare Other | Admitting: Family Medicine

## 2020-10-19 DIAGNOSIS — J0100 Acute maxillary sinusitis, unspecified: Secondary | ICD-10-CM | POA: Insufficient documentation

## 2020-10-19 DIAGNOSIS — J019 Acute sinusitis, unspecified: Secondary | ICD-10-CM | POA: Insufficient documentation

## 2020-10-19 MED ORDER — AMOXICILLIN-POT CLAVULANATE 875-125 MG PO TABS: 1.0000 | ORAL_TABLET | Freq: Two times a day (BID) | ORAL | 0 refills | Status: DC

## 2020-10-19 NOTE — Assessment & Plan Note (Signed)
7 days out from [facility] (covid neg)  Adv fluids, mucinex, nasal saline Rest  Px augmentin   Watch for wheeze/sob or signs of bronchitis (ER if severe or sob)  Update if not starting to improve in a week or if worsening    Meds ordered this encounter  Medications  . amoxicillin-clavulanate (AUGMENTIN) 875-125 MG tablet    Sig: Take 1 tablet by mouth 2 (two) times daily.    Dispense:  14 tablet    Refill:  0

## 2020-10-19 NOTE — Progress Notes (Signed)
Virtual Visit via Video Note  I connected with Lisa Odom on 10/19/20 at  9:30 AM EST by a video enabled telemedicine application and verified that I am speaking with the correct person using two identifiers.  Location: Patient: home Provider: office   I discussed the limitations of evaluation and management by telemedicine and the availability of in person appointments. The patient expressed understanding and agreed to proceed.  Parties involved in encounter  Patient: Lisa Odom  Provider:  Loura Pardon MD   History of Present Illness: 65 yo pt of Dr Diona Browner presents with congestion and sinus pain   Symptoms started a week ago with headache and fatigue  Sinus pain  Fever one night  Took mucinex   Coughing  Dizziness/ nausea  Did not loose taste /smell   Had neg covid test on 1/14   Head congestion  Sinus pain  Ears feel full  Throat is mildly sore   Feels better today Symptoms are improved  Drinking a lot of water  No chills  Pain in cheeks  - better than yesterday but still there  Worse on the r  mucous can be yellow with blood  (also phlegm from cough)     otc mucinex  She took tylenol for the pain  Has saline ns   Had covid imms    Patient Active Problem List   Diagnosis Date Noted  . Acute sinusitis 10/19/2020  . Nasal congestion 05/19/2020  . Pain in both hands 04/09/2020  . Primary osteoarthritis of both first carpometacarpal joints 04/09/2020  . Bilateral thumb pain 02/24/2020  . Chronic foot pain, left 02/24/2020  . Right calf pain 02/24/2020  . Varicose veins of right lower extremity with pain 02/24/2020  . Primary osteoarthritis of right knee 03/11/2019  . Pre-op evaluation 11/23/2018  . Primary osteoarthritis of left hip 11/20/2017  . Intrapelvic protrusion of left acetabulum 11/20/2017  . Left arm pain 01/09/2017  . Bilateral bunions 06/13/2016  . Bilateral low back pain with left-sided sciatica 06/13/2016  . Balance problem  04/10/2016  . Left hip pain 12/20/2015  . Memory loss 12/20/2015  . Osteoporosis 05/29/2015  . Fibromyalgia 05/22/2015  . Spinal stenosis of cervical region 09/12/2014  . Stenosis of cervical spine with myelopathy ([place]) 09/12/2014  . Cervical (neck) region somatic dysfunction   . Elevated CK 08/17/2014  . Chronic neck pain 08/17/2014  . 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 Carpal tunnel syndrome of left wrist 11/30/2012  . Panic attack 02/16/2012  . GERD (gastroesophageal reflux disease) 09/05/2011  . ESSENTIAL HYPERTENSION, BENIGN 04/27/2009  . HYPERCHOLESTEROLEMIA 12/19/2008  . Major depressive disorder, recurrent episode, severe ([place]) 12/19/2008  . MIGRAINE, COMMON 12/19/2008  . ALLERGIC RHINITIS 12/19/2008  . GASTRIC ULCER, ACUTE 12/19/2008  . DIVERTICULITIS, COLON 12/19/2008   Past Medical History:  Diagnosis Date  . Anxiety   . Arthritis   . Contact lens/glasses fitting    wears contacts or glasses  . Depression   . Diabetes mellitus without complication (HCC)    No meds  . Diverticulosis   . Family history of adverse reaction to anesthesia    pts sister has severe N/V  . Fibromyalgia   . Gastritis   . GERD (gastroesophageal reflux disease)   . Hemorrhoid   . Hiatal hernia    neuropathy  . Migraine   . Neuropathy   . Panic attacks   . Pneumonia    as an infant  Past Surgical History:  Procedure Laterality Date  . ANTERIOR CERVICAL DECOMP/DISCECTOMY FUSION N/A 09/12/2014   Procedure: Evacuation of cervical hematoma;  Surgeon: [name], MD;  Location: [facility];  Service: Neurosurgery;  Laterality: N/A;  . ANTERIOR CERVICAL DECOMP/DISCECTOMY FUSION N/A 09/12/2014   Procedure: CERVICAL FOUR-FIVE, CERVICAL FIVE-SIX, CERVICAL SIX-SEVEN ANTERIOR CERVICAL DECOMPRESSION/DISCECTOMY FUSION;  Surgeon: [name], MD;  Location: [facility];  Service: Neurosurgery;  Laterality: N/A;  . BUNIONECTOMY    .  CARPAL TUNNEL RELEASE  2002   right  . COLONOSCOPY    . JOINT REPLACEMENT Left   . SHOULDER ARTHROSCOPY WITH ROTATOR CUFF REPAIR  2010   right  . TOTAL HIP ARTHROPLASTY Left 11/20/2017   Procedure: LEFT TOTAL HIP ARTHROPLASTY ANTERIOR APPROACH WITH BONE GRAFTING AND ACETABULAR SCREWS;  Surgeon: Dorna Leitz, MD;  Location: [facility];  Service: Orthopedics;  Laterality: Left;  . TOTAL KNEE ARTHROPLASTY Right 03/11/2019   Procedure: TOTAL KNEE ARTHROPLASTY;  Surgeon: Dorna Leitz, MD;  Location: [facility];  Service: Orthopedics;  Laterality: Right;  . TRIGGER FINGER RELEASE Right 03/23/2013   Procedure: RELEASE TRIGGER FINGER/A-1 PULLEY RIGHT RING FINGER;  Surgeon: [name], MD;  Location: [facility];  Service: Orthopedics;  Laterality: Right;  . TUBAL LIGATION    . UPPER GI ENDOSCOPY     Social History   Tobacco Use  . Smoking status: Never Smoker  . Smokeless tobacco: Never Used  Vaping Use  . Vaping Use: Never used  Substance Use Topics  . Alcohol use: No    Alcohol/week: 0.0 standard drinks  . Drug use: No   Family History  Problem Relation Age of Onset  . Diabetes Sister   . Anxiety disorder Sister   . Heart disease Brother   . Ovarian cancer Cousin   . Breast cancer Maternal Grandmother        grandmother  . Diabetes Maternal Aunt        aunt  . Alcohol abuse Maternal Uncle        uncle  . Diabetes Paternal Aunt   . Alcohol abuse Paternal Uncle    Allergies  Allergen Reactions  . Hydrochlorothiazide Rash  . Sulfonamide Derivatives Rash   Current Outpatient Medications on File Prior to Visit  Medication Sig Dispense Refill  . Accu-Chek Softclix Lancets lancets USE TO CHECK BLOOD SUGAR  THREE TIMES A DAY. 300 each 3  . ALPRAZolam (XANAX) 0.5 MG tablet Take 0.5 mg by mouth 3 (three) times daily as needed for anxiety.     Marland Kitchen amphetamine-dextroamphetamine (ADDERALL) 7.5 MG tablet Take 7.5 mg by mouth 2 (two) times daily. Morning and after lunch    .  Ascorbic Acid (VITAMIN C PO) Take 1 tablet by mouth 2 (two) times daily.    Marland Kitchen aspirin 81 MG EC tablet Take 81 mg by mouth daily. Swallow whole.     Marland Kitchen atorvastatin (LIPITOR) 40 MG tablet TAKE 1 TABLET BY MOUTH  DAILY 90 tablet 0  . azelastine (ASTELIN) 0.1 % nasal spray Place 2 sprays into both nostrils 2 (two) times daily. Use in each nostril as directed 30 mL 12  . b complex vitamins capsule Take 1 capsule by mouth every 3 (three) days.    . Blood Glucose Calibration (ACCU-CHEK GUIDE CONTROL) LIQD 1 each by In Vitro route daily as needed. 1 each 3  . Blood Glucose Monitoring Suppl (ACCU-CHEK GUIDE ME) w/Device KIT 1 each by Does not apply route in the  morning, at noon, and at bedtime. 1 kit 0  . buPROPion (WELLBUTRIN XL) 150 MG 24 hr tablet TAKE 1 TABLET BY MOUTH  DAILY 90 tablet 2  . CALCIUM-VITAMIN D PO Take 600 mg by mouth 2 (two) times a day.     . celecoxib (CELEBREX) 200 MG capsule Take 200 mg by mouth daily.    . diclofenac Sodium (VOLTAREN) 1 % GEL Apply 2 g topically 4 (four) times daily as needed.    . DULoxetine (CYMBALTA) 60 MG capsule TAKE 2 CAPSULES BY MOUTH  DAILY 180 capsule 2  . fexofenadine (ALLEGRA ALLERGY) 180 MG tablet Take 1 tablet (180 mg total) by mouth daily.    Marland Kitchen gabapentin (NEURONTIN) 600 MG tablet TAKE 1/2 TABLET BY MOUTH IN THE MORNING AND AFTER LUNCH THEN TAKE 2 TABLETS AT  BEDTIME 270 tablet 3  . glucose blood (ACCU-CHEK GUIDE) test strip USE TO CHECK BLOOD SUGAR  THREE TIMES A DAY 300 each 3  . meloxicam (MOBIC) 15 MG tablet TAKE 1 TABLET BY MOUTH  DAILY 90 tablet 3  . Multiple Vitamin (MULTIVITAMIN) tablet Take 1 tablet by mouth daily.    . Omega-3 Fatty Acids (FISH OIL) 1200 MG CAPS Take 1,200 mg by mouth daily.     . pantoprazole (PROTONIX) 40 MG tablet TAKE 1 TABLET BY MOUTH  DAILY 90 tablet 0   No current facility-administered medications on file prior to visit.   Review of Systems  Constitutional: Positive for fever. Negative for chills and  malaise/fatigue.  HENT: Positive for congestion, sinus pain and sore throat. Negative for ear pain.   Eyes: Negative for blurred vision, discharge and redness.  Respiratory: Positive for cough and sputum production. Negative for shortness of breath, wheezing and stridor.   Cardiovascular: Negative for chest pain, palpitations and leg swelling.  Gastrointestinal: Negative for abdominal pain, diarrhea, nausea and vomiting.  Musculoskeletal: Negative for myalgias.  Skin: Negative for rash.  Neurological: Positive for headaches. Negative for dizziness.    Observations/Objective: Patient appears well, in no distress Weight is baseline  No facial swelling or asymmetry Pt indicates sinus pain is worse in R cheek Voice is mildly hoarse  Sounds congested/nasal No obvious tremor or mobility impairment Moving neck and UEs normally Able to hear the call well  No wheeze or shortness of breath during interview  Few dry hacky coughs  Talkative and mentally sharp with no cognitive changes No skin changes on face or neck , no rash or pallor Affect is normal    Assessment and Plan: Problem List Items Addressed This Visit      Respiratory   Acute sinusitis    7 days out from uri (covid neg)  Adv fluids, mucinex, nasal saline Rest  Px augmentin   Watch for wheeze/sob or signs of bronchitis (ER if severe or sob)  Update if not starting to improve in a week or if worsening    Meds ordered this encounter  Medications  . amoxicillin-clavulanate (AUGMENTIN) 875-125 MG tablet    Sig: Take 1 tablet by mouth 2 (two) times daily.    Dispense:  14 tablet    Refill:  0         Relevant Medications   amoxicillin-clavulanate (AUGMENTIN) 875-125 MG tablet       Follow Up Instructions: It sounds like you have a sinus infection  Take the augmentin as directed  Drink fluids and rest  mucinex DM is good for cough and congestion  Nasal saline for congestion  as needed  Also steam Tylenol for  fever or pain or headache  Please alert [place] if symptoms worsen (if severe or short of breath please go to the ER)    I discussed the assessment and treatment plan with the patient. The patient was provided an opportunity to ask questions and all were answered. The patient agreed with the plan and demonstrated an understanding of the instructions.   The patient was advised to call back or seek an in-person evaluation if the symptoms worsen or if the condition fails to improve as anticipated.     Loura Pardon, MD

## 2020-10-19 NOTE — Patient Instructions (Signed)
It sounds like you have a sinus infection  Take the augmentin as directed  Drink fluids and rest  mucinex DM is good for cough and congestion  Nasal saline for congestion as needed  Also steam Tylenol for fever or pain or headache  Please alert [place] if symptoms worsen (if severe or short of breath please go to the ER)

## 2020-10-22 NOTE — Telephone Encounter (Signed)
Patient had a virtual visit with MD 10/18/20.

## 2020-10-26 ENCOUNTER — Encounter: Payer: Medicare Other | Admitting: Family Medicine

## 2020-11-08 ENCOUNTER — Telehealth: Payer: Self-pay | Admitting: Family Medicine

## 2020-11-08 DIAGNOSIS — M810 Age-related osteoporosis without current pathological fracture: Secondary | ICD-10-CM

## 2020-11-08 NOTE — Telephone Encounter (Signed)
Alendronate is not on current medication list.

## 2020-11-08 NOTE — Telephone Encounter (Signed)
Patient is need of a refill of the following Medication: Alendronate 70 mg tablets Pharmacy : [facility]   * she also wants to know if she needs a bone density test*

## 2020-11-09 MED ORDER — ALENDRONATE SODIUM 70 MG PO TABS: 70.0000 mg | ORAL_TABLET | ORAL | 3 refills | Status: DC

## 2020-11-09 NOTE — Addendum Note (Signed)
Addended byEliezer Lofts E on: 11/09/2020 02:03 PM   Modules accepted: Orders

## 2020-11-09 NOTE — Telephone Encounter (Signed)
I will send in fosamax.. she is due for bone density ( every 2 years with osteoporosis).. please let her know I have ordered DEXA.

## 2020-11-09 NOTE — Telephone Encounter (Signed)
Left message for Lisa Odom that Dr. Diona Browner sent in her Fosamax and she is due for a Bone Density.  Dr. Diona Browner has placed the order for that and someone should be in contact with her to schedule.

## 2020-11-13 ENCOUNTER — Telehealth: Payer: Medicare Other

## 2020-11-13 ENCOUNTER — Telehealth: Payer: Self-pay

## 2020-11-14 NOTE — Telephone Encounter (Signed)
Attempted to reach patient for CCM telephone follow up appointment 11/13/20. Left voicemail with contact information. Will have CMA contact for rescheduling.  Ria Comment - can you contact [name] for a general adherence review and reschedule our follow up visit for May 2022 since she will see Dr. Diona Browner next month.   Debbora Dus, PharmD Clinical Pharmacist [facility] [phone]

## 2020-11-16 NOTE — Chronic Care Management (AMB) (Addendum)
Contacted patient to reschedule missed appointment 11/13/20 with Debbora Dus. Patient did not answer left message to return call. Will try again next week.  Follow-Up:  Pharmacist Review  Debbora Dus, CPP notified  Margaretmary Dys, [place] [facility] Assistant [phone]

## 2020-11-23 NOTE — Chronic Care Management (AMB) (Addendum)
Attempted contact again with Lisa Odom to reschedule missed appointment with Debbora Dus. Call went straight to voicemail. Left message for patient to return call.   Follow-Up:  Pharmacist Review  Debbora Dus, CPP notified  Margaretmary Dys, [place] [facility] Assistant [phone]

## 2020-11-26 NOTE — Chronic Care Management (AMB) (Addendum)
Multiple attempts made to contact patient to reschedule appointment with Debbora Dus. Will follow up next month.  Follow-Up:  Pharmacist Review  Debbora Dus, CPP notified  Margaretmary Dys, [place] [facility] Assistant [phone]

## 2020-11-27 ENCOUNTER — Other Ambulatory Visit: Payer: Self-pay | Admitting: Family Medicine

## 2020-11-27 DIAGNOSIS — G894 Chronic pain syndrome: Secondary | ICD-10-CM

## 2020-12-03 LAB — HM DIABETES FOOT EXAM

## 2020-12-07 DIAGNOSIS — M47816 Spondylosis without myelopathy or radiculopathy, lumbar region: Secondary | ICD-10-CM | POA: Diagnosis not present

## 2020-12-07 DIAGNOSIS — R030 Elevated blood-pressure reading, without diagnosis of hypertension: Secondary | ICD-10-CM | POA: Diagnosis not present

## 2020-12-11 ENCOUNTER — Other Ambulatory Visit: Payer: Self-pay

## 2020-12-11 ENCOUNTER — Ambulatory Visit (INDEPENDENT_AMBULATORY_CARE_PROVIDER_SITE_OTHER): Payer: Medicare Other

## 2020-12-11 DIAGNOSIS — Z0000 Encounter for general adult medical examination without abnormal findings: Secondary | ICD-10-CM | POA: Diagnosis not present

## 2020-12-11 NOTE — Patient Instructions (Signed)
Lisa Odom , Thank you for taking time to come for your Medicare Wellness Visit. I appreciate your ongoing commitment to your health goals. Please review the following plan we discussed and let me know if I can assist you in the future.   Screening recommendations/referrals: Colonoscopy: Up to date, completed 07/13/2018, due 06/2028 Mammogram: due, will discuss with provider Bone Density: due, will discuss with provider Recommended yearly ophthalmology/optometry visit for glaucoma screening and checkup Recommended yearly dental visit for hygiene and checkup  Vaccinations: Influenza vaccine: declined Pneumococcal vaccine: due, will discuss with provider Tdap vaccine: decline-insurance  Shingles vaccine: due, check with your insurance regarding coverage if interested    Covid-19:Completed series  Advanced directives: Advance directive discussed with you today. Even though you declined this today please call our office should you change your mind and we can give you the proper paperwork for you to fill out.   Conditions/risks identified: diabetes, hypertension  Next appointment: Follow up in one year for your annual wellness visit    Preventive Care 55 Years and Older, Female Preventive care refers to lifestyle choices and visits with your health care provider that can promote health and wellness. What does preventive care include?  A yearly physical exam. This is also called an annual well check.  Dental exams once or twice a year.  Routine eye exams. Ask your health care provider how often you should have your eyes checked.  Personal lifestyle choices, including:  Daily care of your teeth and gums.  Regular physical activity.  Eating a healthy diet.  Avoiding tobacco and drug use.  Limiting alcohol use.  Practicing safe sex.  Taking low-dose aspirin every day.  Taking vitamin and mineral supplements as recommended by your health care provider. What happens during an  annual well check? The services and screenings done by your health care provider during your annual well check will depend on your age, overall health, lifestyle risk factors, and family history of disease. Counseling  Your health care provider may ask you questions about your:  Alcohol use.  Tobacco use.  Drug use.  Emotional well-being.  Home and relationship well-being.  Sexual activity.  Eating habits.  History of falls.  Memory and ability to understand (cognition).  Work and work Statistician.  Reproductive health. Screening  You may have the following tests or measurements:  Height, weight, and BMI.  Blood pressure.  Lipid and cholesterol levels. These may be checked every 5 years, or more frequently if you are over 44 years old.  Skin check.  Lung cancer screening. You may have this screening every year starting at age 60 if you have a 30-pack-year history of smoking and currently smoke or have quit within the past 15 years.  Fecal occult blood test (FOBT) of the stool. You may have this test every year starting at age 43.  Flexible sigmoidoscopy or colonoscopy. You may have a sigmoidoscopy every 5 years or a colonoscopy every 10 years starting at age 54.  Hepatitis C blood test.  Hepatitis B blood test.  Sexually transmitted disease (STD) testing.  Diabetes screening. This is done by checking your blood sugar (glucose) after you have not eaten for a while (fasting). You may have this done every 1-3 years.  Bone density scan. This is done to screen for osteoporosis. You may have this done starting at age 34.  Mammogram. This may be done every 1-2 years. Talk to your health care provider about how often you should have regular mammograms.  Talk with your health care provider about your test results, treatment options, and if necessary, the need for more tests. Vaccines  Your health care provider may recommend certain vaccines, such as:  Influenza  vaccine. This is recommended every year.  Tetanus, diphtheria, and acellular pertussis (Tdap, Td) vaccine. You may need a Td booster every 10 years.  Zoster vaccine. You may need this after age 58.  Pneumococcal 13-valent conjugate (PCV13) vaccine. One dose is recommended after age 2.  Pneumococcal polysaccharide (PPSV23) vaccine. One dose is recommended after age 97. Talk to your health care provider about which screenings and vaccines you need and how often you need them. This information is not intended to replace advice given to you by your health care provider. Make sure you discuss any questions you have with your health care provider. Document Released: 10/12/2015 Document Revised: 06/04/2016 Document Reviewed: 07/17/2015 Elsevier Interactive Patient Education  2017 [employer] [employer] Prevention in the Home Falls can cause injuries. They can happen to people of all ages. There are many things you can do to make your home safe and to help prevent falls. What can I do on the outside of my home?  Regularly fix the edges of walkways and driveways and fix any cracks.  Remove anything that might make you trip as you walk through a door, such as a raised step or threshold.  Trim any bushes or trees on the path to your home.  Use bright outdoor lighting.  Clear any walking paths of anything that might make someone trip, such as rocks or tools.  Regularly check to see if handrails are loose or broken. Make sure that both sides of any steps have handrails.  Any raised decks and porches should have guardrails on the edges.  Have any leaves, snow, or ice cleared regularly.  Use sand or salt on walking paths during winter.  Clean up any spills in your garage right away. This includes oil or grease spills. What can I do in the bathroom?  Use night lights.  Install grab bars by the toilet and in the tub and shower. Do not use towel bars as grab bars.  Use non-skid mats or decals  in the tub or shower.  If you need to sit down in the shower, use a plastic, non-slip stool.  Keep the floor dry. Clean up any water that spills on the floor as soon as it happens.  Remove soap buildup in the tub or shower regularly.  Attach bath mats securely with double-sided non-slip rug tape.  Do not have throw rugs and other things on the floor that can make you trip. What can I do in the bedroom?  Use night lights.  Make sure that you have a light by your bed that is easy to reach.  Do not use any sheets or blankets that are too big for your bed. They should not hang down onto the floor.  Have a firm chair that has side arms. You can use this for support while you get dressed.  Do not have throw rugs and other things on the floor that can make you trip. What can I do in the kitchen?  Clean up any spills right away.  Avoid walking on wet floors.  Keep items that you use a lot in easy-to-reach places.  If you need to reach something above you, use a strong step stool that has a grab bar.  Keep electrical cords out of the way.  Do not use floor polish or wax that makes floors slippery. If you must use wax, use non-skid floor wax.  Do not have throw rugs and other things on the floor that can make you trip. What can I do with my stairs?  Do not leave any items on the stairs.  Make sure that there are handrails on both sides of the stairs and use them. Fix handrails that are broken or loose. Make sure that handrails are as long as the stairways.  Check any carpeting to make sure that it is firmly attached to the stairs. Fix any carpet that is loose or worn.  Avoid having throw rugs at the top or bottom of the stairs. If you do have throw rugs, attach them to the floor with carpet tape.  Make sure that you have a light switch at the top of the stairs and the bottom of the stairs. If you do not have them, ask someone to add them for you. What else can I do to help  prevent falls?  Wear shoes that:  Do not have high heels.  Have rubber bottoms.  Are comfortable and fit you well.  Are closed at the toe. Do not wear sandals.  If you use a stepladder:  Make sure that it is fully opened. Do not climb a closed stepladder.  Make sure that both sides of the stepladder are locked into place.  Ask someone to hold it for you, if possible.  Clearly mark and make sure that you can see:  Any grab bars or handrails.  First and last steps.  Where the edge of each step is.  Use tools that help you move around (mobility aids) if they are needed. These include:  Canes.  Walkers.  Scooters.  Crutches.  Turn on the lights when you go into a dark area. Replace any light bulbs as soon as they burn out.  Set up your furniture so you have a clear path. Avoid moving your furniture around.  If any of your floors are uneven, fix them.  If there are any pets around you, be aware of where they are.  Review your medicines with your doctor. Some medicines can make you feel dizzy. This can increase your chance of falling. Ask your doctor what other things that you can do to help prevent falls. This information is not intended to replace advice given to you by your health care provider. Make sure you discuss any questions you have with your health care provider. Document Released: 07/12/2009 Document Revised: 02/21/2016 Document Reviewed: 10/20/2014 Elsevier Interactive Patient Education  2017 [employer].

## 2020-12-11 NOTE — Progress Notes (Signed)
Subjective:   Lisa Odom is a 65 y.o. female who presents for Medicare Annual (Subsequent) preventive examination.  Review of Systems: N/A      I connected with the patient today by telephone and verified that I am speaking with the correct person using two identifiers. Location patient: home Location nurse: work Persons participating in the telephone visit: patient, nurse.   I discussed the limitations, risks, security and privacy concerns of performing an evaluation and management service by telephone and the availability of in person appointments. I also discussed with the patient that there may be a patient responsible charge related to this service. The patient expressed understanding and verbally consented to this telephonic visit.        Cardiac Risk Factors include: advanced age (>101mn, >>71women);diabetes mellitus;hypertension     Objective:    Today's Vitals   12/11/20 1314  PainSc: 9    There is no height or weight on file to calculate BMI.  Advanced Directives 12/11/2020 03/11/2019 03/04/2019 12/09/2018 11/20/2017 11/20/2017 11/13/2017  Does Patient Have a Medical Advance Directive? No No No No No No No  Would patient like information on creating a medical advance directive? No - Patient declined No - Patient declined Yes (MAU/Ambulatory/Procedural Areas - Information given) No - Patient declined No - Patient declined (No Data) Yes (MAU/Ambulatory/Procedural Areas - Information given)  Pre-existing out of facility DNR order (yellow form or pink MOST form) - - - - - - -    Current Medications (verified) Outpatient Encounter Medications as of 12/11/2020  Medication Sig  . Accu-Chek Softclix Lancets lancets USE TO CHECK BLOOD SUGAR  THREE TIMES A DAY.  .Marland Kitchenalendronate (FOSAMAX) 70 MG tablet Take 1 tablet (70 mg total) by mouth every 7 (seven) days. Take with a full glass of water on an empty stomach.  . ALPRAZolam (XANAX) 0.5 MG tablet Take 0.5 mg by mouth 3 (three)  times daily as needed for anxiety.   .Marland Kitchenamphetamine-dextroamphetamine (ADDERALL) 7.5 MG tablet Take 7.5 mg by mouth 2 (two) times daily. Morning and after lunch  . Ascorbic Acid (VITAMIN C PO) Take 1 tablet by mouth 2 (two) times daily.  .Marland Kitchenaspirin 81 MG EC tablet Take 81 mg by mouth daily. Swallow whole.   .Marland Kitchenatorvastatin (LIPITOR) 40 MG tablet TAKE 1 TABLET BY MOUTH  DAILY  . azelastine (ASTELIN) 0.1 % nasal spray Place 2 sprays into both nostrils 2 (two) times daily. Use in each nostril as directed  . b complex vitamins capsule Take 1 capsule by mouth every 3 (three) days.  . Blood Glucose Calibration (ACCU-CHEK GUIDE CONTROL) LIQD 1 each by In Vitro route daily as needed.  . Blood Glucose Monitoring Suppl (ACCU-CHEK GUIDE ME) w/Device KIT 1 each by Does not apply route in the morning, at noon, and at bedtime.  .Marland KitchenbuPROPion (WELLBUTRIN XL) 150 MG 24 hr tablet TAKE 1 TABLET BY MOUTH  DAILY  . CALCIUM-VITAMIN D PO Take 600 mg by mouth 2 (two) times a day.   . celecoxib (CELEBREX) 200 MG capsule Take 200 mg by mouth daily.  . diclofenac Sodium (VOLTAREN) 1 % GEL Apply 2 g topically 4 (four) times daily as needed.  . DULoxetine (CYMBALTA) 60 MG capsule TAKE 2 CAPSULES BY MOUTH  DAILY  . fexofenadine (ALLEGRA ALLERGY) 180 MG tablet Take 1 tablet (180 mg total) by mouth daily.  .Marland Kitchengabapentin (NEURONTIN) 600 MG tablet TAKE 1/2 TABLET BY MOUTH IN THE MORNING AND AFTER LUNCH  THEN TAKE 2 TABLETS AT  BEDTIME  . glucose blood (ACCU-CHEK GUIDE) test strip USE TO CHECK BLOOD SUGAR  THREE TIMES A DAY  . meloxicam (MOBIC) 15 MG tablet TAKE 1 TABLET BY MOUTH  DAILY  . Multiple Vitamin (MULTIVITAMIN) tablet Take 1 tablet by mouth daily.  . Omega-3 Fatty Acids (FISH OIL) 1200 MG CAPS Take 1,200 mg by mouth daily.   . pantoprazole (PROTONIX) 40 MG tablet TAKE 1 TABLET BY MOUTH  DAILY  . amoxicillin-clavulanate (AUGMENTIN) 875-125 MG tablet Take 1 tablet by mouth 2 (two) times daily. (Patient not taking: Reported on  12/11/2020)   No facility-administered encounter medications on file as of 12/11/2020.    Allergies (verified) Hydrochlorothiazide and Sulfonamide derivatives   History: Past Medical History:  Diagnosis Date  . Anxiety   . Arthritis   . Contact lens/glasses fitting    wears contacts or glasses  . Depression   . Diabetes mellitus without complication (HCC)    No meds  . Diverticulosis   . Family history of adverse reaction to anesthesia    pts sister has severe N/V  . Fibromyalgia   . Gastritis   . GERD (gastroesophageal reflux disease)   . Hemorrhoid   . Hiatal hernia    neuropathy  . Migraine   . Neuropathy   . Panic attacks   . Pneumonia    as an infant   Past Surgical History:  Procedure Laterality Date  . ANTERIOR CERVICAL DECOMP/DISCECTOMY FUSION N/A 09/12/2014   Procedure: Evacuation of cervical hematoma;  Surgeon: [name], MD;  Location: [place] [facility];  Service: Neurosurgery;  Laterality: N/A;  . ANTERIOR CERVICAL DECOMP/DISCECTOMY FUSION N/A 09/12/2014   Procedure: CERVICAL FOUR-FIVE, CERVICAL FIVE-SIX, CERVICAL SIX-SEVEN ANTERIOR CERVICAL DECOMPRESSION/DISCECTOMY FUSION;  Surgeon: [name], MD;  Location: [place] [facility];  Service: Neurosurgery;  Laterality: N/A;  . BUNIONECTOMY    . CARPAL TUNNEL RELEASE  2002   right  . COLONOSCOPY    . JOINT REPLACEMENT Left   . SHOULDER ARTHROSCOPY WITH ROTATOR CUFF REPAIR  2010   right  . TOTAL HIP ARTHROPLASTY Left 11/20/2017   Procedure: LEFT TOTAL HIP ARTHROPLASTY ANTERIOR APPROACH WITH BONE GRAFTING AND ACETABULAR SCREWS;  Surgeon: Dorna Leitz, MD;  Location: [facility];  Service: Orthopedics;  Laterality: Left;  . TOTAL KNEE ARTHROPLASTY Right 03/11/2019   Procedure: TOTAL KNEE ARTHROPLASTY;  Surgeon: Dorna Leitz, MD;  Location: [facility];  Service: Orthopedics;  Laterality: Right;  . TRIGGER FINGER RELEASE Right 03/23/2013   Procedure: RELEASE TRIGGER FINGER/A-1 PULLEY RIGHT RING FINGER;  Surgeon: [name], MD;   Location: [facility];  Service: Orthopedics;  Laterality: Right;  . TUBAL LIGATION    . UPPER GI ENDOSCOPY     Family History  Problem Relation Age of Onset  . Diabetes Sister   . Anxiety disorder Sister   . Heart disease Brother   . Ovarian cancer Cousin   . Breast cancer Maternal Grandmother        grandmother  . Diabetes Maternal Aunt        aunt  . Alcohol abuse Maternal Uncle        uncle  . Diabetes Paternal Aunt   . Alcohol abuse Paternal Uncle    Social History   Socioeconomic History  . Marital status: Divorced    Spouse name: Not on file  . Number of children: 1  . Years of education: Not on file  . Highest education level: Not  on file  Occupational History    Employer: [employer]  Tobacco Use  . Smoking status: Never Smoker  . Smokeless tobacco: Never Used  Vaping Use  . Vaping Use: Never used  Substance and Sexual Activity  . Alcohol use: No    Alcohol/week: 0.0 standard drinks  . Drug use: No  . Sexual activity: Yes  Other Topics Concern  . Not on file  Social History Narrative   Lives alone in a one story home.  Has one child.     Currently not working.     Worked for [employer].     Social Determinants of Health   Financial Resource Strain: Low Risk   . Difficulty of Paying Living Expenses: Not hard at all  Food Insecurity: No Food Insecurity  . Worried About Charity fundraiser in the Last Year: Never true  . Ran Out of Food in the Last Year: Never true  Transportation Needs: No Transportation Needs  . Lack of Transportation (Medical): No  . Lack of Transportation (Non-Medical): No  Physical Activity: Inactive  . Days of Exercise per Week: 0 days  . Minutes of Exercise per Session: 0 min  Stress: No Stress Concern Present  . Feeling of Stress : Not at all  Social Connections: Not on file    Tobacco Counseling Counseling given: Not Answered   Clinical Intake:  Pre-visit preparation completed: Yes  Pain : 0-10 Pain  Score: 9  Pain Type: Chronic pain Pain Location: Back Pain Orientation: Lower Pain Descriptors / Indicators: Aching Pain Onset: More than a month ago Pain Frequency: Intermittent     Nutritional Risks: None Diabetes: Yes CBG done?: No Did pt. bring in CBG monitor from home?: No  How often do you need to have someone help you when you read instructions, pamphlets, or other written materials from your doctor or pharmacy?: 1 - Never What is the last grade level you completed in school?: more than 4 years of college  Diabetic: Yes Nutrition Risk Assessment:  Has the patient had any N/V/D within the last 2 months?  No  Does the patient have any non-healing wounds?  No  Has the patient had any unintentional weight loss or weight gain?  No   Diabetes:  Is the patient diabetic?  Yes  If diabetic, was a CBG obtained today?  No  telephone visit  Did the patient bring in their glucometer from home?  No  telephone visit  How often do you monitor your CBG's? daily.   Financial Strains and Diabetes Management:  Are you having any financial strains with the device, your supplies or your medication? No .  Does the patient want to be seen by Chronic Care Management for management of their diabetes?  No  Would the patient like to be referred to a Nutritionist or for Diabetic Management?  No   Diabetic Exams:  Diabetic Eye Exam: Completed 08/29/2020 Diabetic Foot Exam: Overdue, Pt has been advised about the importance in completing this exam. Pt is scheduled for diabetic foot exam on 12/18/2020.   Interpreter Needed?: No  Information entered by :: CJohnson, LPN   Activities of Daily Living In your present state of health, do you have any difficulty performing the following activities: 12/11/2020  Hearing? Y  Comment constant noise in right hear  Vision? N  Difficulty concentrating or making decisions? Y  Comment some memory loss noted  Walking or climbing stairs? N  Dressing or  bathing? N  Doing  errands, shopping? N  Preparing Food and eating ? N  Using the Toilet? N  In the past six months, have you accidently leaked urine? N  Do you have problems with loss of bowel control? N  Managing your Medications? N  Managing your Finances? N  Housekeeping or managing your Housekeeping? N  Some recent data might be hidden    Patient Care Team: [name], MD as PCP - General Debbora Dus, [facility] as Pharmacist (Pharmacist)  Indicate any recent Medical Services you may have received from other than [facility] providers in the past year (date may be approximate).     Assessment:   This is a routine wellness examination for Lisa Odom.  Hearing/Vision screen  Hearing Screening   125Hz  250Hz  500Hz  1000Hz  2000Hz  3000Hz  4000Hz  6000Hz  8000Hz   Right ear:           Left ear:           Vision Screening Comments: Patient gets annual eye exams  Dietary issues and exercise activities discussed: Current Exercise Habits: The patient does not participate in regular exercise at present, Exercise limited by: None identified  Goals    . Patient Stated     12/11/2020, I will maintain and continue medications as prescribed.    . Pharmacy Care Plan     CARE PLAN ENTRY (see longitudinal plan of care for additional care plan information)  Current Barriers:  . Chronic Disease Management support, education, and care coordination needs related to Hyperlipidemia and Diabetes  Hyperlipidemia Lab Results  Component Value Date/Time   LDLCALC 66 09/16/2019 04:30 PM   . Pharmacist Clinical Goal(s): o Over the next 90 days, patient will work with PharmD and providers to maintain LDL goal < 70 . Current regimen:  o Atorvastatin 40 mg daily  o Aspirin EC 81 mg daily . Interventions: o Reviewed patient's medications and adherence.  o Patient working to incorporate 20 minutes of exercise in each day via walking or using pedal exerciser.  . Patient self care activities - Over the next 90  days, patient will: o Continue to take medications as prescribed.   Diabetes Lab Results  Component Value Date/Time   HGBA1C 6.4 (H) 09/16/2019 04:30 PM   HGBA1C 6.4 (H) 03/04/2019 10:52 AM   . Pharmacist Clinical Goal(s): o Over the next 90 days, patient will work with PharmD and providers to maintain A1c goal <7% . Current regimen:  o Diet and lifestyle . Interventions: o Discussed exercise and healthy diet.  o Reviewed blood sugars at home.  . Patient self care activities - Over the next 90 days, patient will: o Check blood sugar once daily, document, and provide at future appointments o Contact provider with any episodes of hypoglycemia  Medication management . Pharmacist Clinical Goal(s): o Over the next 90 days, patient will work with PharmD and providers to maintain optimal medication adherence . Current pharmacy: [facility] . Interventions o Comprehensive medication review performed. o Continue current medication management strategy . Patient self care activities - Over the next 90 days, patient will: o Focus on medication adherence by pill box o Take medications as prescribed o Report any questions or concerns to PharmD and/or provider(s)  Initial goal documentation       Depression Screen PHQ 2/9 Scores 12/11/2020 09/16/2019 06/22/2018 04/28/2017 08/13/2015  PHQ - 2 Score 4 2 2 2 6   PHQ- 9 Score 10 11 11  - 24    Fall Risk Fall Risk  12/11/2020 09/16/2019 06/22/2018  06/16/2016 05/20/2016  Falls in the past year? 0 0 No Yes Yes  Number falls in past yr: 0 - - 2 or more 2 or more  Injury with Fall? 0 - - No No  Risk for fall due to : Medication side effect - - Impaired balance/gait Impaired mobility  Follow up Falls evaluation completed;Falls prevention discussed - - Falls evaluation completed;Education provided;Falls prevention discussed Falls evaluation completed;Education provided;Falls prevention discussed    FALL RISK PREVENTION PERTAINING TO THE  HOME:  Any stairs in or around the home? Yes  If so, are there any without handrails? No  Home free of loose throw rugs in walkways, pet beds, electrical cords, etc? Yes  Adequate lighting in your home to reduce risk of falls? Yes   ASSISTIVE DEVICES UTILIZED TO PREVENT FALLS:  Life alert? No  Use of a cane, walker or w/c? Yes  Grab bars in the bathroom? No  Shower chair or bench in shower? No  Elevated toilet seat or a handicapped toilet? No   TIMED UP AND GO:  Was the test performed? N/A telephone visit .    Cognitive Function: MMSE - Mini Mental State Exam 12/11/2020 06/16/2016  Not completed: - Unable to complete  Orientation to time 5 -  Orientation to Place 5 -  Registration 3 -  Attention/ Calculation 5 -  Recall 3 -  Language- repeat 1 -  Mini Cog  Mini-Cog screen was completed. Maximum score is 22. A value of 0 denotes this part of the MMSE was not completed or the patient failed this part of the Mini-Cog screening.  Montreal Cognitive Assessment  05/20/2016  Visuospatial/ Executive (0/5) 4  Naming (0/3) 3  Attention: Read list of digits (0/2) 1  Attention: Read list of letters (0/1) 1  Attention: Serial 7 subtraction starting at 100 (0/3) 1  Language: Repeat phrase (0/2) 1  Language : Fluency (0/1) 0  Abstraction (0/2) 1  Delayed Recall (0/5) 2  Orientation (0/6) 6  Total 20  Adjusted Score (based on education) 20      Immunizations Immunization History  Administered Date(s) Administered  . Fluad Quad(high Dose 65+) 09/16/2019  . Influenza Inj Mdck Quad With Preservative 10/08/2017  . Influenza Whole 09/06/2009, 08/29/2010  . Influenza,inj,Quad PF,6+ Mos 07/29/2013, 05/22/2015, 06/13/2016, 06/22/2018  . PFIZER(Purple Top)SARS-COV-2 Vaccination 12/12/2019, 01/03/2020, 09/14/2020  . Pneumococcal Conjugate-13 09/16/2019  . Pneumococcal Polysaccharide-23 08/04/2013  . Td 09/29/2008    TDAP status: Due, Education has been provided regarding the  importance of this vaccine. Advised may receive this vaccine at local pharmacy or Health Dept. Aware to provide a copy of the vaccination record if obtained from local pharmacy or Health Dept. Verbalized acceptance and understanding.  Flu Vaccine status: Declined, Education has been provided regarding the importance of this vaccine but patient still declined. Advised may receive this vaccine at local pharmacy or Health Dept. Aware to provide a copy of the vaccination record if obtained from local pharmacy or Health Dept. Verbalized acceptance and understanding.  Pneumococcal vaccine status: Due, Education has been provided regarding the importance of this vaccine. Advised may receive this vaccine at local pharmacy or Health Dept. Aware to provide a copy of the vaccination record if obtained from local pharmacy or Health Dept. Verbalized acceptance and understanding.  Covid-19 vaccine status: Completed vaccines  Qualifies for Shingles Vaccine? Yes  Zostavax completed No   Shingrix Completed?: No.    Education has been provided regarding the importance of this vaccine. Patient has  been advised to call insurance company to determine out of pocket expense if they have not yet received this vaccine. Advised may also receive vaccine at local pharmacy or Health Dept. Verbalized acceptance and understanding.  Screening Tests Health Maintenance  Topic Date Due  . DEXA SCAN  08/18/2020  . FOOT EXAM  09/04/2020  . PNA vac Low Risk Adult (2 of 2 - PPSV23) 09/15/2020  . MAMMOGRAM  09/20/2020  . INFLUENZA VACCINE  12/27/2020 (Originally 04/29/2020)  . TETANUS/TDAP  12/12/2023 (Originally 09/29/2018)  . HEMOGLOBIN A1C  04/17/2021  . OPHTHALMOLOGY EXAM  08/29/2021  . URINE MICROALBUMIN  10/18/2021  . COLONOSCOPY (Pts 45-29yr Insurance coverage will need to be confirmed)  07/13/2028  . COVID-19 Vaccine  Completed  . Hepatitis C Screening  Completed  . HPV VACCINES  Aged Out    Health Maintenance  Health  Maintenance Due  Topic Date Due  . DEXA SCAN  08/18/2020  . FOOT EXAM  09/04/2020  . PNA vac Low Risk Adult (2 of 2 - PPSV23) 09/15/2020  . MAMMOGRAM  09/20/2020    Colorectal cancer screening: Type of screening: Colonoscopy. Completed 07/13/2018. Repeat every 10 years  Mammogram status: due, will discuss with provider  Bone Density status:due, will discuss with provider   Lung Cancer Screening: (Low Dose CT Chest recommended if Age 67-80years, 30 pack-year currently smoking OR have quit w/in 15 years.) does not qualify.  Additional Screening:  Hepatitis C Screening: does qualify; Completed 12/03/2015  Vision Screening: Recommended annual ophthalmology exams for early detection of glaucoma and other disorders of the eye. Is the patient up to date with their annual eye exam?  Yes  Who is the provider or what is the name of the office in which the patient attends annual eye exams? [facility], Dr. BRick DuffIf pt is not established with a provider, would they like to be referred to a provider to establish care? No .   Dental Screening: Recommended annual dental exams for proper oral hygiene  Community Resource Referral / Chronic Care Management: CRR required this visit?  No   CCM required this visit?  No      Plan:     I have personally reviewed and noted the following in the patient's chart:   . Medical and social history . Use of alcohol, tobacco or illicit drugs  . Current medications and supplements . Functional ability and status . Nutritional status . Physical activity . Advanced directives . List of other physicians . Hospitalizations, surgeries, and ER visits in previous 12 months . Vitals . Screenings to include cognitive, depression, and falls . Referrals and appointments  In addition, I have reviewed and discussed with patient certain preventive protocols, quality metrics, and best practice recommendations. A written personalized care plan for preventive  services as well as general preventive health recommendations were provided to patient.   Due to this being a telephonic visit, the after visit summary with patients personalized plan was offered to patient via office or my-chart. Patient preferred to pick up at office at next visit or via mychart.   JAndrez Grime LPN   [id number]

## 2020-12-11 NOTE — Progress Notes (Signed)
PCP notes:  Health Maintenance: Pneumo 23- due Mammo- due Dexa- due  Foot exams- due   Abnormal Screenings: none   Patient concerns: Handicap placard form    Nurse concerns: none   Next PCP appt.: 12/18/2020 @ 2:40 pm

## 2020-12-18 ENCOUNTER — Ambulatory Visit (INDEPENDENT_AMBULATORY_CARE_PROVIDER_SITE_OTHER): Payer: Medicare Other | Admitting: Family Medicine

## 2020-12-18 ENCOUNTER — Encounter: Payer: Self-pay | Admitting: Family Medicine

## 2020-12-18 ENCOUNTER — Other Ambulatory Visit: Payer: Self-pay

## 2020-12-18 VITALS — BP 140/78 | HR 94 | Temp 98.3°F | Ht 61.0 in | Wt 154.8 lb

## 2020-12-18 DIAGNOSIS — E1165 Type 2 diabetes mellitus with hyperglycemia: Secondary | ICD-10-CM | POA: Diagnosis not present

## 2020-12-18 DIAGNOSIS — E785 Hyperlipidemia, unspecified: Secondary | ICD-10-CM | POA: Diagnosis not present

## 2020-12-18 DIAGNOSIS — E1169 Type 2 diabetes mellitus with other specified complication: Secondary | ICD-10-CM

## 2020-12-18 DIAGNOSIS — F332 Major depressive disorder, recurrent severe without psychotic features: Secondary | ICD-10-CM

## 2020-12-18 DIAGNOSIS — I152 Hypertension secondary to endocrine disorders: Secondary | ICD-10-CM | POA: Diagnosis not present

## 2020-12-18 DIAGNOSIS — E1159 Type 2 diabetes mellitus with other circulatory complications: Secondary | ICD-10-CM | POA: Diagnosis not present

## 2020-12-18 DIAGNOSIS — Z0000 Encounter for general adult medical examination without abnormal findings: Secondary | ICD-10-CM

## 2020-12-18 DIAGNOSIS — Z23 Encounter for immunization: Secondary | ICD-10-CM

## 2020-12-18 DIAGNOSIS — I10 Essential (primary) hypertension: Secondary | ICD-10-CM

## 2020-12-18 DIAGNOSIS — IMO0002 Reserved for concepts with insufficient information to code with codable children: Secondary | ICD-10-CM

## 2020-12-18 DIAGNOSIS — E7800 Pure hypercholesterolemia, unspecified: Secondary | ICD-10-CM

## 2020-12-18 NOTE — Assessment & Plan Note (Signed)
Borderline  Blood pressure control in office on no med.  Follow at home, goal BP < 140/90.

## 2020-12-18 NOTE — Assessment & Plan Note (Addendum)
Stable, chronic.  Continue current medication. Followed by psychiatry.

## 2020-12-18 NOTE — Progress Notes (Signed)
  Patient ID: Lisa Odom, female    DOB: 11/15/1955, 65 y.o.   MRN: [id number]  This visit was conducted in person.  BP 140/78   Pulse 94   Temp 98.3 F (36.8 C) (Temporal)   Ht 5' 1" (1.549 m)   Wt 154 lb 12 oz (70.2 kg)   SpO2 96%   BMI 29.24 kg/m    CC: , Chief Complaint  Patient presents with  . Annual Exam    Part 2    Subjective:   HPI: Lisa Odom is a 65 y.o. female presenting on 12/18/2020 for Annual Exam (Part 2)  The patient saw a LPN or RN for medicare wellness visit.  Prevention and wellness was reviewed in detail. Note reviewed and important notes copied below.  PCP notes:  Health Maintenance: Pneumo 23- due Mammo- due Dexa- due  Foot exams- due   Abnormal Screenings: none  12/18/20 Diabetes:   Worsened control on no medication. Lab Results  Component Value Date   HGBA1C 7.3 (H) 10/18/2020  Using medications without difficulties: Hypoglycemic episodes: Hyperglycemic episodes: Feet problems: no ulcers Blood Sugars averaging: FBS 94-158 eye exam within last year: yes  Hypertension:   Borderline control in office on no medication.  BP Readings from Last 3 Encounters:  12/18/20 140/78  03/27/20 120/80  02/24/20 118/72  Using medication without problems or lightheadedness: none Chest pain with exertion:none Edema:occ Short of breath: none Average home BPs: 120/70s Other issues:  Elevated Cholesterol:  LDL at goal on atorvastatin 40 mg daily. Lab Results  Component Value Date   CHOL 115 10/18/2020   HDL 43.50 10/18/2020   LDLCALC 53 10/18/2020   TRIG 90.0 10/18/2020   CHOLHDL 3 10/18/2020  Using medications without problems:none Muscle aches:  none Diet compliance: healthy Exercise: off and on pain in low back limiting exercise. Other complaints:   MDD, stable control on Cymbalta high dose. Followed by psychiatry.   Relevant past medical, surgical, family and social history reviewed and updated as  indicated. Interim medical history since our last visit reviewed. Allergies and medications reviewed and updated. Outpatient Medications Prior to Visit  Medication Sig Dispense Refill  . Accu-Chek Softclix Lancets lancets USE TO CHECK BLOOD SUGAR  THREE TIMES A DAY. 300 each 3  . alendronate (FOSAMAX) 70 MG tablet Take 1 tablet (70 mg total) by mouth every 7 (seven) days. Take with a full glass of water on an empty stomach. 12 tablet 3  . ALPRAZolam (XANAX) 0.5 MG tablet Take 0.5 mg by mouth 3 (three) times daily as needed for anxiety.     . amphetamine-dextroamphetamine (ADDERALL) 7.5 MG tablet Take 7.5 mg by mouth 2 (two) times daily. Morning and after lunch    . aspirin 81 MG EC tablet Take 81 mg by mouth daily. Swallow whole.     . atorvastatin (LIPITOR) 40 MG tablet TAKE 1 TABLET BY MOUTH  DAILY 90 tablet 0  . azelastine (ASTELIN) 0.1 % nasal spray Place 2 sprays into both nostrils 2 (two) times daily. Use in each nostril as directed 30 mL 12  . b complex vitamins capsule Take 1 capsule by mouth every 3 (three) days.    . Blood Glucose Calibration (ACCU-CHEK GUIDE CONTROL) LIQD 1 each by In Vitro route daily as needed. 1 each 3  . Blood Glucose Monitoring Suppl (ACCU-CHEK GUIDE ME) w/Device KIT 1 each by Does not apply route in the morning, at noon, and at bedtime. 1 kit 0  .   buPROPion (WELLBUTRIN XL) 150 MG 24 hr tablet TAKE 1 TABLET BY MOUTH  DAILY 90 tablet 2  . CALCIUM-VITAMIN D PO Take 600 mg by mouth 2 (two) times a day.     . celecoxib (CELEBREX) 200 MG capsule Take 200 mg by mouth daily.    . diclofenac Sodium (VOLTAREN) 1 % GEL Apply 2 g topically 4 (four) times daily as needed.    . DULoxetine (CYMBALTA) 60 MG capsule TAKE 2 CAPSULES BY MOUTH  DAILY 180 capsule 2  . fexofenadine (ALLEGRA ALLERGY) 180 MG tablet Take 1 tablet (180 mg total) by mouth daily.    Marland Kitchen gabapentin (NEURONTIN) 600 MG tablet TAKE 1/2 TABLET BY MOUTH IN THE MORNING AND AFTER LUNCH THEN TAKE 2 TABLETS AT  BEDTIME  270 tablet 3  . glucose blood (ACCU-CHEK GUIDE) test strip USE TO CHECK BLOOD SUGAR  THREE TIMES A DAY 300 each 3  . meloxicam (MOBIC) 15 MG tablet TAKE 1 TABLET BY MOUTH  DAILY 90 tablet 3  . Multiple Vitamin (MULTIVITAMIN) tablet Take 1 tablet by mouth daily.    . Omega-3 Fatty Acids (FISH OIL) 1200 MG CAPS Take 1,200 mg by mouth daily.     . pantoprazole (PROTONIX) 40 MG tablet TAKE 1 TABLET BY MOUTH  DAILY 90 tablet 0  . amoxicillin-clavulanate (AUGMENTIN) 875-125 MG tablet Take 1 tablet by mouth 2 (two) times daily. (Patient not taking: Reported on 12/11/2020) 14 tablet 0  . Ascorbic Acid (VITAMIN C PO) Take 1 tablet by mouth 2 (two) times daily.     No facility-administered medications prior to visit.     Per HPI unless specifically indicated in ROS section below Review of Systems  Constitutional: Negative for fatigue and fever.  HENT: Negative for congestion.   Eyes: Negative for pain.  Respiratory: Negative for cough and shortness of breath.   Cardiovascular: Negative for chest pain, palpitations and leg swelling.  Gastrointestinal: Negative for abdominal pain.  Genitourinary: Negative for dysuria and vaginal bleeding.  Musculoskeletal: Positive for back pain.  Neurological: Negative for syncope, light-headedness and headaches.  Psychiatric/Behavioral: Negative for dysphoric mood.   Objective:  BP 140/78   Pulse 94   Temp 98.3 F (36.8 C) (Temporal)   Ht 5' 1" (1.549 m)   Wt 154 lb 12 oz (70.2 kg)   SpO2 96%   BMI 29.24 kg/m   Wt Readings from Last 3 Encounters:  12/18/20 154 lb 12 oz (70.2 kg)  03/27/20 158 lb 4 oz (71.8 kg)  02/24/20 159 lb (72.1 kg)      Physical Exam Constitutional:      General: She is not in acute distress.    Appearance: Normal appearance. She is well-developed. She is not ill-appearing or toxic-appearing.  HENT:     Head: Normocephalic.     Right Ear: Hearing, tympanic membrane, ear canal and external ear normal.     Left Ear: Hearing,  tympanic membrane, ear canal and external ear normal.     Nose: Nose normal.  Eyes:     General: Lids are normal. Lids are everted, no foreign bodies appreciated.     Conjunctiva/sclera: Conjunctivae normal.     Pupils: Pupils are equal, round, and reactive to light.  Neck:     Thyroid: No thyroid mass or thyromegaly.     Vascular: No carotid bruit.     Trachea: Trachea normal.  Cardiovascular:     Rate and Rhythm: Normal rate and regular rhythm.     Heart  sounds: Normal heart sounds, S1 normal and S2 normal. No murmur heard. No gallop.   Pulmonary:     Effort: Pulmonary effort is normal. No respiratory distress.     Breath sounds: Normal breath sounds. No wheezing, rhonchi or rales.  Abdominal:     General: Bowel sounds are normal. There is no distension or abdominal bruit.     Palpations: Abdomen is soft. There is no fluid wave or mass.     Tenderness: There is no abdominal tenderness. There is no guarding or rebound.     Hernia: No hernia is present.  Musculoskeletal:     Cervical back: Normal range of motion and neck supple.  Lymphadenopathy:     Cervical: No cervical adenopathy.  Skin:    General: Skin is warm and dry.     Findings: No rash.  Neurological:     Mental Status: She is alert.     Cranial Nerves: No cranial nerve deficit.     Sensory: No sensory deficit.  Psychiatric:        Mood and Affect: Mood is not anxious or depressed.        Speech: Speech normal.        Behavior: Behavior normal. Behavior is cooperative.        Judgment: Judgment normal.     Diabetic foot exam: Normal inspection No skin breakdown No calluses  Normal DP pulses Normal sensation to light touch and monofilament Nails normal     Results for orders placed or performed in visit on 10/18/20  Comprehensive metabolic panel  Result Value Ref Range   Sodium 140 135 - 145 mEq/L   Potassium 3.8 3.5 - 5.1 mEq/L   Chloride 105 96 - 112 mEq/L   CO2 30 19 - 32 mEq/L   Glucose, Bld 130  (H) 70 - 99 mg/dL   BUN 16 6 - 23 mg/dL   Creatinine, Ser 0.74 0.40 - 1.20 mg/dL   Total Bilirubin 0.5 0.2 - 1.2 mg/dL   Alkaline Phosphatase 84 39 - 117 U/L   AST 16 0 - 37 U/L   ALT 19 0 - 35 U/L   Total Protein 7.2 6.0 - 8.3 g/dL   Albumin 4.2 3.5 - 5.2 g/dL   GFR 84.03 >60.00 mL/min   Calcium 9.1 8.4 - 10.5 mg/dL  Lipid panel  Result Value Ref Range   Cholesterol 115 0 - 200 mg/dL   Triglycerides 90.0 0.0 - 149.0 mg/dL   HDL 43.50 >39.00 mg/dL   VLDL 18.0 0.0 - 40.0 mg/dL   LDL Cholesterol 53 0 - 99 mg/dL   Total CHOL/HDL Ratio 3    NonHDL 71.25   Hemoglobin A1c  Result Value Ref Range   Hgb A1c MFr Bld 7.3 (H) 4.6 - 6.5 %  Microalbumin / creatinine urine ratio  Result Value Ref Range   Microalb, Ur 1.5 0.0 - 1.9 mg/dL   Creatinine,U 169.9 mg/dL   Microalb Creat Ratio 0.9 0.0 - 30.0 mg/g    This visit occurred during the SARS-CoV-2 public health emergency.  Safety protocols were in place, including screening questions prior to the visit, additional usage of staff PPE, and extensive cleaning of exam room while observing appropriate contact time as indicated for disinfecting solutions.   COVID 19 screen:  No recent travel or known exposure to COVID19 The patient denies respiratory symptoms of COVID 19 at this time. The importance of social distancing was discussed today.   Assessment and Plan The patient's preventative   maintenance and recommended screening tests for an annual wellness exam were reviewed in full today. Brought up to date unless services declined.  Counselled on the importance of diet, exercise, and its role in overall health and mortality. The patient's FH and SH was reviewed, including their home life, tobacco status, and drug and alcohol status.   Vaccine:  Due for  td, PCV23,  Consider shingrix.. rx given. PAP 2016 nml pap, neg HPV. Assymptomatic Mammogram every 1-2 years. Due. COLON:  2019 Dr. Nandigam repeat in 10 years. hep C done DEXA  2019... due   Problem List Items Addressed This Visit    Hyperlipidemia associated with type 2 diabetes mellitus (HCC)    Stable, chronic.  Continue current medication.   atorvastatin 40 mg daily.      Hypertension associated with diabetes (HCC)    Borderline  Blood pressure control in office on no med.  Follow at home, goal BP < 140/90.      Major depressive disorder, recurrent episode, severe (HCC) (Chronic)    Stable, chronic.  Continue current medication. Followed by psychiatry.         Uncontrolled diabetes mellitus with circulatory complication  HTN (HCC)    Yearly eye exam due.  Encouraged exercise, weight loss, healthy eating habits. Re-eval in 3 months.       Other Visit Diagnoses    Routine general medical examination at a health care facility    -  Primary   Need for 23-polyvalent pneumococcal polysaccharide vaccine       Relevant Orders   Pneumococcal polysaccharide vaccine 23-valent greater than or equal to 2yo subcutaneous/IM (Completed)        , MD   

## 2020-12-18 NOTE — Patient Instructions (Addendum)
Borderline  Blood pressure control in office on no med.  Follow at home, goal BP < 140/90.  Set up yearly eye exam for diabetes and have the opthalmologist send [place] a copy of the evaluation for the chart.  Please call the location of your choice from the menu below to schedule your Mammogram and/or Bone Density appointment.    [place]   1. [facility]                      Phone:  [phone] [address], [place] [address]                                                             Services: Traditional and 3D Mammogram, Bone Density   2. [facility]                 Phone: [phone] [address], [place] [address]    Service: Bone Density ONLY   *this site does NOT perform mammograms  3. [place]                        Phone:  [phone] [address] [place], [place] [address]                                            Services:  3D Mammogram and Bone Density    [place]  1. [place] [facility]   Phone:  [phone]   [place], [place] [address]                                            Services: 3D Mammogram and Bone Density  2. [place] at [facility])  Phone:  [phone]   [phone]  [address] [place], [place] [address]                                              Services:  3D Mammogram and Bone Density

## 2020-12-18 NOTE — Assessment & Plan Note (Signed)
Stable, chronic.  Continue current medication.    atorvastatin 40 mg daily. 

## 2020-12-19 NOTE — Telephone Encounter (Signed)
Patient left VM returning call. Please call back to schedule her for May 2022 CCM telephone visit. Current phone number in chart is accurate.

## 2020-12-24 ENCOUNTER — Encounter: Payer: Self-pay | Admitting: Family Medicine

## 2020-12-29 ENCOUNTER — Other Ambulatory Visit: Payer: Self-pay | Admitting: Family Medicine

## 2020-12-29 DIAGNOSIS — E1140 Type 2 diabetes mellitus with diabetic neuropathy, unspecified: Secondary | ICD-10-CM

## 2021-01-12 ENCOUNTER — Other Ambulatory Visit: Payer: Self-pay | Admitting: Family Medicine

## 2021-01-14 NOTE — Telephone Encounter (Signed)
Last visit 12/18/2020 for CPE.  Last refilled 02/13/2020 for #90 with 3 refills. Next Appt: 03/19/2021 for 3 month follow up.

## 2021-01-15 NOTE — Telephone Encounter (Signed)
Left message for Lisa Odom to return my call. 

## 2021-01-17 DIAGNOSIS — M8589 Other specified disorders of bone density and structure, multiple sites: Secondary | ICD-10-CM | POA: Diagnosis not present

## 2021-01-17 DIAGNOSIS — Z1231 Encounter for screening mammogram for malignant neoplasm of breast: Secondary | ICD-10-CM | POA: Diagnosis not present

## 2021-01-17 DIAGNOSIS — M810 Age-related osteoporosis without current pathological fracture: Secondary | ICD-10-CM | POA: Diagnosis not present

## 2021-01-17 LAB — HM MAMMOGRAPHY

## 2021-01-17 LAB — HM DEXA SCAN

## 2021-01-18 ENCOUNTER — Encounter: Payer: Self-pay | Admitting: Family Medicine

## 2021-01-22 ENCOUNTER — Encounter: Payer: Self-pay | Admitting: Family Medicine

## 2021-01-30 NOTE — Assessment & Plan Note (Signed)
Yearly eye exam due.  Encouraged exercise, weight loss, healthy eating habits. Re-eval in 3 months.

## 2021-02-19 ENCOUNTER — Other Ambulatory Visit: Payer: Self-pay | Admitting: Family Medicine

## 2021-02-19 DIAGNOSIS — G894 Chronic pain syndrome: Secondary | ICD-10-CM

## 2021-03-08 ENCOUNTER — Telehealth: Payer: Self-pay | Admitting: Family Medicine

## 2021-03-08 DIAGNOSIS — E1140 Type 2 diabetes mellitus with diabetic neuropathy, unspecified: Secondary | ICD-10-CM

## 2021-03-08 NOTE — Telephone Encounter (Signed)
-----   Message from Cloyd Stagers, RT sent at 03/04/2021  4:35 PM EDT ----- Regarding: Lab Orders for Tuesday 6.21.2022 Please place lab orders for Tuesday 6.21.2022, office visit for 3 month f/u on Tuesday 6.28.2022 Thank you, Dyke Maes RT(R)

## 2021-03-19 ENCOUNTER — Other Ambulatory Visit: Payer: Self-pay

## 2021-03-19 ENCOUNTER — Other Ambulatory Visit (INDEPENDENT_AMBULATORY_CARE_PROVIDER_SITE_OTHER): Payer: Medicare Other

## 2021-03-19 DIAGNOSIS — E1140 Type 2 diabetes mellitus with diabetic neuropathy, unspecified: Secondary | ICD-10-CM | POA: Diagnosis not present

## 2021-03-19 LAB — COMPREHENSIVE METABOLIC PANEL
ALT: 19 U/L (ref 0–35)
AST: 18 U/L (ref 0–37)
Albumin: 4.4 g/dL (ref 3.5–5.2)
Alkaline Phosphatase: 82 U/L (ref 39–117)
BUN: 17 mg/dL (ref 6–23)
CO2: 28 mEq/L (ref 19–32)
Calcium: 9.2 mg/dL (ref 8.4–10.5)
Chloride: 103 mEq/L (ref 96–112)
Creatinine, Ser: 0.68 mg/dL (ref 0.40–1.20)
GFR: 90.19 mL/min (ref >60.00)
Glucose, Bld: 94 mg/dL (ref 70–99)
Potassium: 3.6 mEq/L (ref 3.5–5.1)
Sodium: 139 mEq/L (ref 135–145)
Total Bilirubin: 0.4 mg/dL (ref 0.2–1.2)
Total Protein: 7.3 g/dL (ref 6.0–8.3)

## 2021-03-19 LAB — LIPID PANEL
Cholesterol: 120 mg/dL (ref 0–200)
HDL: 52.1 mg/dL (ref >39.00)
LDL Cholesterol: 53 mg/dL (ref 0–99)
NonHDL: 68.23
Total CHOL/HDL Ratio: 2
Triglycerides: 77 mg/dL (ref 0.0–149.0)
VLDL: 15.4 mg/dL (ref 0.0–40.0)

## 2021-03-19 LAB — HEMOGLOBIN A1C: Hgb A1c MFr Bld: 6.9 % — ABNORMAL HIGH (ref 4.6–6.5)

## 2021-03-20 NOTE — Progress Notes (Signed)
No critical labs need to be addressed urgently. We will discuss labs in detail at upcoming office visit.   

## 2021-03-26 ENCOUNTER — Other Ambulatory Visit: Payer: Self-pay

## 2021-03-26 ENCOUNTER — Encounter: Payer: Self-pay | Admitting: Family Medicine

## 2021-03-26 ENCOUNTER — Ambulatory Visit (INDEPENDENT_AMBULATORY_CARE_PROVIDER_SITE_OTHER): Payer: Medicare Other | Admitting: Family Medicine

## 2021-03-26 VITALS — BP 140/70 | HR 87 | Temp 98.4°F | Ht 61.0 in | Wt 155.8 lb

## 2021-03-26 DIAGNOSIS — E1169 Type 2 diabetes mellitus with other specified complication: Secondary | ICD-10-CM

## 2021-03-26 DIAGNOSIS — E785 Hyperlipidemia, unspecified: Secondary | ICD-10-CM

## 2021-03-26 DIAGNOSIS — I152 Hypertension secondary to endocrine disorders: Secondary | ICD-10-CM | POA: Diagnosis not present

## 2021-03-26 DIAGNOSIS — E1159 Type 2 diabetes mellitus with other circulatory complications: Secondary | ICD-10-CM

## 2021-03-26 NOTE — Assessment & Plan Note (Signed)
Stable, chronic.  Continue current medication.    atorvastatin 40 mg dialy

## 2021-03-26 NOTE — Assessment & Plan Note (Signed)
Borderline control in office with Adderall and NSAID use.

## 2021-03-26 NOTE — Assessment & Plan Note (Signed)
Stable, chronic.  Diet controlled  Encouraged exercise, weight loss, healthy eating habits.

## 2021-03-26 NOTE — Progress Notes (Signed)
Patient ID: Lisa Odom, female    DOB: Jan 23, 1956, 65 y.o.   MRN: [id number]  This visit was conducted in person.  BP 140/70   Pulse 87   Temp 98.4 F (36.9 C) (Temporal)   Ht 5' 1" (1.549 m)   Wt 155 lb 12 oz (70.6 kg)   SpO2 96%   BMI 29.43 kg/m    CC: Chief Complaint  Patient presents with   Diabetes    Subjective:   HPI: Lisa Odom is a 65 y.o. female presenting on 03/26/2021 for Diabetes   Diabetes:  She has obtained better control with increase in activity and diet control  Lab Results  Component Value Date   HGBA1C 6.9 (H) 03/19/2021  Using medications without difficulties: Hypoglycemic episodes:none Hyperglycemic episodes: none Feet problems: none Blood Sugars averaging: FBS 99-119 eye exam within last year: yes   Elevated Cholesterol:  LDL at goal on atorvastatin 40 Lab Results  Component Value Date   CHOL 120 03/19/2021   HDL 52.10 03/19/2021   LDLCALC 53 03/19/2021   TRIG 77.0 03/19/2021   CHOLHDL 2 03/19/2021  Using medications without problems: Muscle aches:  none Diet compliance:yes  Exercise:yes Other complaints:  Hypertension:    BP Readings from Last 3 Encounters:  03/26/21 140/70  12/18/20 140/78  03/27/20 120/80     Using medication without problems or lightheadedness:  Chest pain with exertion: Edema: Short of breath: Average home BPs: Other issues:        Relevant past medical, surgical, family and social history reviewed and updated as indicated. Interim medical history since our last visit reviewed. Allergies and medications reviewed and updated. Outpatient Medications Prior to Visit  Medication Sig Dispense Refill   ACCU-CHEK AVIVA PLUS test strip USE TO CHECK BLOOD SUGAR 3  TIMES DAILY 300 strip 3   Accu-Chek Softclix Lancets lancets USE TO CHECK BLOOD SUGAR  THREE TIMES A DAY. 300 each 3   alendronate (FOSAMAX) 70 MG tablet Take 1 tablet (70 mg total) by mouth every 7 (seven) days. Take with a  full glass of water on an empty stomach. 12 tablet 3   ALPRAZolam (XANAX) 0.5 MG tablet Take 0.5 mg by mouth 3 (three) times daily as needed for anxiety.      amphetamine-dextroamphetamine (ADDERALL) 7.5 MG tablet Take 7.5 mg by mouth 2 (two) times daily. Morning and after lunch     aspirin 81 MG EC tablet Take 81 mg by mouth daily. Swallow whole.      atorvastatin (LIPITOR) 40 MG tablet TAKE 1 TABLET BY MOUTH  DAILY 90 tablet 3   azelastine (ASTELIN) 0.1 % nasal spray Place 2 sprays into both nostrils 2 (two) times daily. Use in each nostril as directed 30 mL 12   b complex vitamins capsule Take 1 capsule by mouth every 3 (three) days.     Blood Glucose Calibration (ACCU-CHEK GUIDE CONTROL) LIQD 1 each by In Vitro route daily as needed. 1 each 3   Blood Glucose Monitoring Suppl (ACCU-CHEK GUIDE ME) w/Device KIT 1 each by Does not apply route in the morning, at noon, and at bedtime. 1 kit 0   buPROPion (WELLBUTRIN XL) 150 MG 24 hr tablet TAKE 1 TABLET BY MOUTH  DAILY 90 tablet 2   CALCIUM-VITAMIN D PO Take 600 mg by mouth 2 (two) times a day.      diclofenac Sodium (VOLTAREN) 1 % GEL Apply 2 g topically 4 (four) times daily as needed.  DULoxetine (CYMBALTA) 60 MG capsule TAKE 2 CAPSULES BY MOUTH  DAILY 180 capsule 2   fexofenadine (ALLEGRA ALLERGY) 180 MG tablet Take 1 tablet (180 mg total) by mouth daily.     gabapentin (NEURONTIN) 600 MG tablet TAKE 1/2 TABLET BY MOUTH IN THE MORNING AND AFTER LUNCH THEN TAKE 2 TABLETS AT  BEDTIME 270 tablet 3   meloxicam (MOBIC) 15 MG tablet TAKE 1 TABLET BY MOUTH  DAILY 90 tablet 3   Multiple Vitamin (MULTIVITAMIN) tablet Take 1 tablet by mouth daily.     Omega-3 Fatty Acids (FISH OIL) 1200 MG CAPS Take 1,200 mg by mouth daily.      pantoprazole (PROTONIX) 40 MG tablet TAKE 1 TABLET BY MOUTH  DAILY 90 tablet 3   celecoxib (CELEBREX) 200 MG capsule Take 200 mg by mouth daily.     No facility-administered medications prior to visit.     Per HPI unless  specifically indicated in ROS section below Review of Systems  Constitutional:  Negative for fatigue and fever.  HENT:  Negative for ear pain.   Eyes:  Negative for pain.  Respiratory:  Negative for chest tightness and shortness of breath.   Cardiovascular:  Negative for chest pain, palpitations and leg swelling.  Gastrointestinal:  Negative for abdominal pain.  Genitourinary:  Negative for dysuria.  Objective:  BP 140/70   Pulse 87   Temp 98.4 F (36.9 C) (Temporal)   Ht 5' 1" (1.549 m)   Wt 155 lb 12 oz (70.6 kg)   SpO2 96%   BMI 29.43 kg/m   Wt Readings from Last 3 Encounters:  03/26/21 155 lb 12 oz (70.6 kg)  12/18/20 154 lb 12 oz (70.2 kg)  03/27/20 158 lb 4 oz (71.8 kg)      Physical Exam Constitutional:      General: She is not in acute distress.    Appearance: Normal appearance. She is well-developed. She is not ill-appearing or toxic-appearing.  HENT:     Head: Normocephalic.     Right Ear: Hearing, tympanic membrane, ear canal and external ear normal. Tympanic membrane is not erythematous, retracted or bulging.     Left Ear: Hearing, tympanic membrane, ear canal and external ear normal. Tympanic membrane is not erythematous, retracted or bulging.     Nose: No mucosal edema or rhinorrhea.     Right Sinus: No maxillary sinus tenderness or frontal sinus tenderness.     Left Sinus: No maxillary sinus tenderness or frontal sinus tenderness.     Mouth/Throat:     Pharynx: Uvula midline.  Eyes:     General: Lids are normal. Lids are everted, no foreign bodies appreciated.     Conjunctiva/sclera: Conjunctivae normal.     Pupils: Pupils are equal, round, and reactive to light.  Neck:     Thyroid: No thyroid mass or thyromegaly.     Vascular: No carotid bruit.     Trachea: Trachea normal.  Cardiovascular:     Rate and Rhythm: Normal rate and regular rhythm.     Pulses: Normal pulses.     Heart sounds: Normal heart sounds, S1 normal and S2 normal. No murmur heard.    No friction rub. No gallop.  Pulmonary:     Effort: Pulmonary effort is normal. No tachypnea or respiratory distress.     Breath sounds: Normal breath sounds. No decreased breath sounds, wheezing, rhonchi or rales.  Abdominal:     General: Bowel sounds are normal.     Palpations: Abdomen  is soft.     Tenderness: There is no abdominal tenderness.  Musculoskeletal:     Cervical back: Normal range of motion and neck supple.  Skin:    General: Skin is warm and dry.     Findings: No rash.  Neurological:     Mental Status: She is alert.  Psychiatric:        Mood and Affect: Mood is not anxious or depressed.        Speech: Speech normal.        Behavior: Behavior normal. Behavior is cooperative.        Thought Content: Thought content normal.        Judgment: Judgment normal.      Results for orders placed or performed in visit on 03/19/21  Comprehensive metabolic panel  Result Value Ref Range   Sodium 139 135 - 145 mEq/L   Potassium 3.6 3.5 - 5.1 mEq/L   Chloride 103 96 - 112 mEq/L   CO2 28 19 - 32 mEq/L   Glucose, Bld 94 70 - 99 mg/dL   BUN 17 6 - 23 mg/dL   Creatinine, Ser 0.68 0.40 - 1.20 mg/dL   Total Bilirubin 0.4 0.2 - 1.2 mg/dL   Alkaline Phosphatase 82 39 - 117 U/L   AST 18 0 - 37 U/L   ALT 19 0 - 35 U/L   Total Protein 7.3 6.0 - 8.3 g/dL   Albumin 4.4 3.5 - 5.2 g/dL   GFR 90.19 >60.00 mL/min   Calcium 9.2 8.4 - 10.5 mg/dL  Lipid panel  Result Value Ref Range   Cholesterol 120 0 - 200 mg/dL   Triglycerides 77.0 0.0 - 149.0 mg/dL   HDL 52.10 >39.00 mg/dL   VLDL 15.4 0.0 - 40.0 mg/dL   LDL Cholesterol 53 0 - 99 mg/dL   Total CHOL/HDL Ratio 2    NonHDL 68.23   Hemoglobin A1c  Result Value Ref Range   Hgb A1c MFr Bld 6.9 (H) 4.6 - 6.5 %    This visit occurred during the SARS-CoV-2 public health emergency.  Safety protocols were in place, including screening questions prior to the visit, additional usage of staff PPE, and extensive cleaning of exam room while  observing appropriate contact time as indicated for disinfecting solutions.   COVID 19 screen:  No recent travel or known exposure to COVID19 The patient denies respiratory symptoms of COVID 19 at this time. The importance of social distancing was discussed today.   Assessment and Plan Problem List Items Addressed This Visit     Hyperlipidemia associated with type 2 diabetes mellitus ([place]) - Primary    Stable, chronic.  Continue current medication.    atorvastatin 40 mg dialy       Hypertension associated with diabetes ([place])    Borderline control in office with Adderall and NSAID use.       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  Diet controlled  Encouraged exercise, weight loss, healthy eating habits.              [name], MD

## 2021-03-26 NOTE — Patient Instructions (Signed)
Keep up the good work on regular exercise and low carb diet.

## 2021-04-23 DIAGNOSIS — H16213 Exposure keratoconjunctivitis, bilateral: Secondary | ICD-10-CM | POA: Diagnosis not present

## 2021-04-23 DIAGNOSIS — H04123 Dry eye syndrome of bilateral lacrimal glands: Secondary | ICD-10-CM | POA: Diagnosis not present

## 2021-05-13 DIAGNOSIS — H16213 Exposure keratoconjunctivitis, bilateral: Secondary | ICD-10-CM | POA: Diagnosis not present

## 2021-05-13 DIAGNOSIS — H16223 Keratoconjunctivitis sicca, not specified as Sjogren's, bilateral: Secondary | ICD-10-CM | POA: Diagnosis not present

## 2021-05-16 DIAGNOSIS — H00024 Hordeolum internum left upper eyelid: Secondary | ICD-10-CM | POA: Diagnosis not present

## 2021-05-20 ENCOUNTER — Other Ambulatory Visit: Payer: Self-pay | Admitting: Family Medicine

## 2021-05-21 NOTE — Telephone Encounter (Signed)
Last office visit 03/26/2021 for DM.  Last refilled 02/13/2020 for #90 with 3 refills.  Next Appt: 10/04/2021 for DM.

## 2021-06-18 ENCOUNTER — Other Ambulatory Visit: Payer: Self-pay | Admitting: Family Medicine

## 2021-07-12 ENCOUNTER — Telehealth (INDEPENDENT_AMBULATORY_CARE_PROVIDER_SITE_OTHER): Payer: Medicare Other | Admitting: Family Medicine

## 2021-07-12 DIAGNOSIS — U071 COVID-19: Secondary | ICD-10-CM | POA: Diagnosis not present

## 2021-07-12 MED ORDER — MOLNUPIRAVIR EUA 200MG CAPSULE
4.0000 | ORAL_CAPSULE | Freq: Two times a day (BID) | ORAL | 0 refills | Status: DC
Start: 2021-07-12 — End: 2021-07-13

## 2021-07-12 NOTE — Assessment & Plan Note (Signed)
COVID19  Infection < 5 days from onset of symptoms in triple vaccinated overweight individual with history of DM.  No clear sign of bacterial infection at this time.   No SOB.  No red flags/need for ER visit or in-person exam at respiratory clinic at this time..    Pt higher risk for COVID complications given DM, HTN. Chose molnupiravir given medication contraindications ( on bupropion).  Start molnupiravir 5 day course. Reviewed course of medication and side effect profile with patient in detail.   Symptomatic care with mucinex and cough suppressant at night. If SOB begins symptoms worsening.. have low threshold for in-person exam, if severe shortness of breath ER visit recommended.  Can monitor Oxygen saturation at home with home monitor if able to obtain.  Go to ER if O2 sat < 90% on room air.   Reviewed home care and provided information through [facility].  Recommended quarantine 5 days isolation recommended. Return to work day 6 and wear mask for 4 more days to complete 10 days. Provided info about prevention of spread of COVID 19.

## 2021-07-12 NOTE — Progress Notes (Signed)
VIRTUAL VISIT Due to national recommendations of social distancing due to [place] 19, a virtual visit is felt to be most appropriate for this patient at this time.   I connected with the patient on 07/12/21 at  4:00 PM EDT by virtual telehealth platform and verified that I am speaking with the correct person using two identifiers.   I discussed the limitations, risks, security and privacy concerns of performing an evaluation and management service by  virtual telehealth platform and the availability of in person appointments. I also discussed with the patient that there may be a patient responsible charge related to this service. The patient expressed understanding and agreed to proceed.  Patient location: Home Provider Location: [facility] Participants: [name] and Shara Hartis [place]  CC: cough  History of Present Illness:  65 year old female patient with history of  DM2, HTN presents with new onset   Onset of symptoms  7 days ago   last week started with nasal congestion, pressure.  Now in last 24 hours feels much worse  Now with productive phlegm, slightly yellow, no face pain , no ear pain,    She has some post nasal drip and ST form this.   Some body ache today  No SOB, no wheeze.   Low grade temp today, but last night chills and felt hot last night.  She has been using OTC guaifenesin, acetaminophen.. helped a little.   COVID 19 screen COVID testing: COVID test positive today. COVID vaccine: 09/14/2020 , 01/03/2020 , 12/12/2019 COVID exposure: No recent travel or known exposure to [place]  The importance of social distancing was discussed today.    Review of Systems  Constitutional:  Positive for chills, fever and malaise/fatigue.  HENT:  Positive for congestion. Negative for ear pain.   Eyes:  Negative for pain and redness.  Respiratory:  Positive for cough. Negative for shortness of breath.   Cardiovascular:  Negative for chest pain, palpitations and leg  swelling.  Gastrointestinal:  Negative for abdominal pain, blood in stool, constipation, diarrhea, nausea and vomiting.  Genitourinary:  Negative for dysuria.  Musculoskeletal:  Negative for falls and myalgias.  Skin:  Negative for rash.  Neurological:  Negative for dizziness.  Psychiatric/Behavioral:  Negative for depression. The patient is not nervous/anxious.      Past Medical History:  Diagnosis Date   Anxiety    Arthritis    Contact lens/glasses fitting    wears contacts or glasses   Depression    Diabetes mellitus without complication ([place])    No meds   Diverticulosis    Family history of adverse reaction to anesthesia    pts sister has severe N/V   Fibromyalgia    Gastritis    GERD (gastroesophageal reflux disease)    Hemorrhoid    Hiatal hernia    neuropathy   Migraine    Neuropathy    Panic attacks    Pneumonia    as an infant    reports that she has never smoked. She has never used smokeless tobacco. She reports that she does not drink alcohol and does not use drugs.   Current Outpatient Medications:    ACCU-CHEK AVIVA PLUS test strip, USE TO CHECK BLOOD SUGAR 3  TIMES DAILY, Disp: 300 strip, Rfl: 3   Accu-Chek Softclix Lancets lancets, USE TO CHECK BLOOD SUGAR  THREE TIMES A DAY., Disp: 300 each, Rfl: 3   alendronate (FOSAMAX) 70 MG tablet, Take 1 tablet (70 mg total) by  mouth every 7 (seven) days. Take with a full glass of water on an empty stomach., Disp: 12 tablet, Rfl: 3   ALPRAZolam (XANAX) 0.5 MG tablet, Take 0.5 mg by mouth 3 (three) times daily as needed for anxiety. , Disp: , Rfl:    amphetamine-dextroamphetamine (ADDERALL) 7.5 MG tablet, Take 7.5 mg by mouth 2 (two) times daily. Morning and after lunch, Disp: , Rfl:    aspirin 81 MG EC tablet, Take 81 mg by mouth daily. Swallow whole. , Disp: , Rfl:    atorvastatin (LIPITOR) 40 MG tablet, TAKE 1 TABLET BY MOUTH  DAILY, Disp: 90 tablet, Rfl: 3   azelastine (ASTELIN) 0.1 % nasal spray, Place 2 sprays into  both nostrils 2 (two) times daily. Use in each nostril as directed, Disp: 30 mL, Rfl: 12   b complex vitamins capsule, Take 1 capsule by mouth every 3 (three) days., Disp: , Rfl:    Blood Glucose Calibration (ACCU-CHEK GUIDE CONTROL) LIQD, 1 each by In Vitro route daily as needed., Disp: 1 each, Rfl: 3   Blood Glucose Monitoring Suppl (ACCU-CHEK GUIDE ME) w/Device KIT, 1 each by Does not apply route in the morning, at noon, and at bedtime., Disp: 1 kit, Rfl: 0   buPROPion (WELLBUTRIN XL) 150 MG 24 hr tablet, TAKE 1 TABLET BY MOUTH  DAILY, Disp: 90 tablet, Rfl: 2   CALCIUM-VITAMIN D PO, Take 600 mg by mouth 2 (two) times a day. , Disp: , Rfl:    diclofenac Sodium (VOLTAREN) 1 % GEL, Apply 2 g topically 4 (four) times daily as needed., Disp: , Rfl:    DULoxetine (CYMBALTA) 60 MG capsule, TAKE 2 CAPSULES BY MOUTH  DAILY, Disp: 180 capsule, Rfl: 2   fexofenadine (ALLEGRA ALLERGY) 180 MG tablet, Take 1 tablet (180 mg total) by mouth daily., Disp: , Rfl:    gabapentin (NEURONTIN) 600 MG tablet, TAKE 1/2 TABLET BY MOUTH IN THE MORNING AND AFTER LUNCH THEN TAKE 2 TABLETS AT  BEDTIME, Disp: 270 tablet, Rfl: 3   meloxicam (MOBIC) 15 MG tablet, TAKE 1 TABLET BY MOUTH  DAILY, Disp: 90 tablet, Rfl: 3   Multiple Vitamin (MULTIVITAMIN) tablet, Take 1 tablet by mouth daily., Disp: , Rfl:    Omega-3 Fatty Acids (FISH OIL) 1200 MG CAPS, Take 1,200 mg by mouth daily. , Disp: , Rfl:    pantoprazole (PROTONIX) 40 MG tablet, TAKE 1 TABLET BY MOUTH  DAILY, Disp: 90 tablet, Rfl: 3   Observations/Objective:  Temp: 98.45F,  Physical Exam  Physical Exam Constitutional:      General: The patient is not in acute distress. Pulmonary:     Effort: Pulmonary effort is normal. No respiratory distress.  Neurological:     Mental Status: The patient is alert and oriented to person, place, and time.  Psychiatric:        Mood and Affect: Mood normal.        Behavior: Behavior normal.   Assessment and Plan Problem List Items  Addressed This Visit     COVID-19 virus infection - Primary    COVID19  Infection < 5 days from onset of symptoms in triple vaccinated overweight individual with history of DM.  No clear sign of bacterial infection at this time.   No SOB.  No red flags/need for ER visit or in-person exam at respiratory clinic at this time..    Pt higher risk for COVID complications given DM, HTN. Chose molnupiravir given medication contraindications ( on bupropion).  Start molnupiravir 5  day course. Reviewed course of medication and side effect profile with patient in detail.   Symptomatic care with mucinex and cough suppressant at night. If SOB begins symptoms worsening.. have low threshold for in-person exam, if severe shortness of breath ER visit recommended.  Can monitor Oxygen saturation at home with home monitor if able to obtain.  Go to ER if O2 sat < 90% on room air.   Reviewed home care and provided information through [facility].  Recommended quarantine 5 days isolation recommended. Return to work day 6 and wear mask for 4 more days to complete 10 days. Provided info about prevention of spread of COVID 19.       Relevant Medications   molnupiravir EUA (LAGEVRIO) 200 mg CAPS capsule      I discussed the assessment and treatment plan with the patient. The patient was provided an opportunity to ask questions and all were answered. The patient agreed with the plan and demonstrated an understanding of the instructions.   The patient was advised to call back or seek an in-person evaluation if the symptoms worsen or if the condition fails to improve as anticipated.     [name], MD

## 2021-07-13 ENCOUNTER — Telehealth (HOSPITAL_COMMUNITY): Payer: Self-pay | Admitting: Emergency Medicine

## 2021-07-13 MED ORDER — MOLNUPIRAVIR EUA 200MG CAPSULE: 4.0000 | ORAL_CAPSULE | Freq: Two times a day (BID) | ORAL | 0 refills | Status: DC

## 2021-07-13 MED ORDER — MOLNUPIRAVIR EUA 200MG CAPSULE: 4.0000 | ORAL_CAPSULE | Freq: Two times a day (BID) | ORAL | 0 refills | Status: AC

## 2021-07-13 NOTE — Telephone Encounter (Signed)
Patient here requesting prescription for antiviral.  Patient had video visit with [facility] and was prescribed molnipiravir as she is COVID positive but pharmacy did not receive prescription.  Patient has not been able to get in touch with anyone from the [place] office.  Prescription was confirmed to have been sent but was not received.  New prescription for molnipiravir given to patient.

## 2021-07-13 NOTE — Telephone Encounter (Signed)
FYI I have sent message to patient apologizing for delay as well as asked number that she called so that we can make sure that it is not a phone issue for our office.

## 2021-07-15 NOTE — Telephone Encounter (Signed)
Also please let pt know  I checked...molnupiravir was sent in at time of virtual appt... to [facility].. I am not sure why they did not have it.

## 2021-07-25 NOTE — Telephone Encounter (Signed)
Thank you for making me aware.  I am not familiar as to what that number [phone] is or why she was given an on call number.  Our after hours service is our phone number and it rolls directly to the on call care service.    This is strange and I will investigate.    Thanks.

## 2021-08-23 ENCOUNTER — Other Ambulatory Visit: Payer: Self-pay | Admitting: Family Medicine

## 2021-08-31 ENCOUNTER — Other Ambulatory Visit: Payer: Self-pay | Admitting: Family Medicine

## 2021-09-01 NOTE — Telephone Encounter (Signed)
Last office visit 07/12/21 (virtual) for Covid-19.  Last refilled 10/01/20 for #[id number] with 3 refills. Next Appt: 10/04/21 for DM.

## 2021-09-09 LAB — HM DIABETES EYE EXAM

## 2021-09-13 ENCOUNTER — Telehealth: Payer: Self-pay | Admitting: Family Medicine

## 2021-09-13 DIAGNOSIS — E1159 Type 2 diabetes mellitus with other circulatory complications: Secondary | ICD-10-CM

## 2021-09-13 NOTE — Telephone Encounter (Signed)
-----   Message from Ellamae Sia sent at 09/12/2021  8:15 AM EST ----- Regarding: Lab orders for Friday, 12.30.22 Lab orders for a 6 month follow up appt

## 2021-09-17 ENCOUNTER — Encounter: Payer: Self-pay | Admitting: Family Medicine

## 2021-09-27 ENCOUNTER — Other Ambulatory Visit (INDEPENDENT_AMBULATORY_CARE_PROVIDER_SITE_OTHER): Payer: Medicare Other

## 2021-09-27 ENCOUNTER — Other Ambulatory Visit: Payer: Self-pay

## 2021-09-27 DIAGNOSIS — E1159 Type 2 diabetes mellitus with other circulatory complications: Secondary | ICD-10-CM | POA: Diagnosis not present

## 2021-09-27 LAB — LIPID PANEL
Cholesterol: 126 mg/dL (ref 0–200)
HDL: 43.2 mg/dL (ref >39.00)
LDL Cholesterol: 60 mg/dL (ref 0–99)
NonHDL: 82.47
Total CHOL/HDL Ratio: 3
Triglycerides: 112 mg/dL (ref 0.0–149.0)
VLDL: 22.4 mg/dL (ref 0.0–40.0)

## 2021-09-27 LAB — MICROALBUMIN / CREATININE URINE RATIO
Creatinine,U: 93 mg/dL
Microalb Creat Ratio: 0.8 mg/g (ref 0.0–30.0)
Microalb, Ur: <0.7 mg/dL (ref 0.0–1.9)

## 2021-09-27 LAB — COMPREHENSIVE METABOLIC PANEL
ALT: 19 U/L (ref 0–35)
AST: 19 U/L (ref 0–37)
Albumin: 4.3 g/dL (ref 3.5–5.2)
Alkaline Phosphatase: 76 U/L (ref 39–117)
BUN: 16 mg/dL (ref 6–23)
CO2: 28 mEq/L (ref 19–32)
Calcium: 9.2 mg/dL (ref 8.4–10.5)
Chloride: 102 mEq/L (ref 96–112)
Creatinine, Ser: 0.77 mg/dL (ref 0.40–1.20)
GFR: 79.59 mL/min (ref >60.00)
Glucose, Bld: 126 mg/dL — ABNORMAL HIGH (ref 70–99)
Potassium: 3.9 mEq/L (ref 3.5–5.1)
Sodium: 138 mEq/L (ref 135–145)
Total Bilirubin: 0.7 mg/dL (ref 0.2–1.2)
Total Protein: 7.4 g/dL (ref 6.0–8.3)

## 2021-09-27 LAB — HEMOGLOBIN A1C: Hgb A1c MFr Bld: 6.9 % — ABNORMAL HIGH (ref 4.6–6.5)

## 2021-10-01 NOTE — Progress Notes (Signed)
No critical labs need to be addressed urgently. We will discuss labs in detail at upcoming office visit.   

## 2021-10-03 DIAGNOSIS — M25551 Pain in right hip: Secondary | ICD-10-CM | POA: Diagnosis not present

## 2021-10-03 DIAGNOSIS — M1611 Unilateral primary osteoarthritis, right hip: Secondary | ICD-10-CM | POA: Diagnosis not present

## 2021-10-04 ENCOUNTER — Other Ambulatory Visit: Payer: Self-pay

## 2021-10-04 ENCOUNTER — Encounter: Payer: Self-pay | Admitting: Family Medicine

## 2021-10-04 ENCOUNTER — Ambulatory Visit (INDEPENDENT_AMBULATORY_CARE_PROVIDER_SITE_OTHER): Payer: Medicare Other | Admitting: Family Medicine

## 2021-10-04 VITALS — BP 120/72 | HR 91 | Temp 97.6°F | Ht 61.0 in | Wt 159.5 lb

## 2021-10-04 DIAGNOSIS — E785 Hyperlipidemia, unspecified: Secondary | ICD-10-CM | POA: Diagnosis not present

## 2021-10-04 DIAGNOSIS — E1169 Type 2 diabetes mellitus with other specified complication: Secondary | ICD-10-CM

## 2021-10-04 DIAGNOSIS — I152 Hypertension secondary to endocrine disorders: Secondary | ICD-10-CM | POA: Diagnosis not present

## 2021-10-04 DIAGNOSIS — E1159 Type 2 diabetes mellitus with other circulatory complications: Secondary | ICD-10-CM | POA: Diagnosis not present

## 2021-10-04 NOTE — Progress Notes (Signed)
Patient ID: Lisa Odom, female    DOB: 05/08/1956, 66 y.o.   MRN: [id number]  This visit was conducted in person.  BP 120/72    Pulse 91    Temp 97.6 F (36.4 C) (Temporal)    Ht 5' 1"  (1.549 m)    Wt 159 lb 8 oz (72.3 kg)    SpO2 91%    BMI 30.14 kg/m    CC: Chief Complaint  Patient presents with   Diabetes    Subjective:   HPI: Lisa Odom is a 66 y.o. female presenting on 10/04/2021 for Diabetes  Diabetes:   good control with diet. Lab Results  Component Value Date   HGBA1C 6.9 (H) 09/27/2021  Using medications without difficulties: Hypoglycemic episodes:none Hyperglycemic episodes:none Feet problems: no ulcers Blood Sugars averaging:  eye exam within last year: yes  Elevated Cholesterol:  good control on statin Lab Results  Component Value Date   CHOL 126 09/27/2021   HDL 43.20 09/27/2021   LDLCALC 60 09/27/2021   TRIG 112.0 09/27/2021   CHOLHDL 3 09/27/2021  Using medications without problems: Muscle aches:  Diet compliance: Exercise: walking ( going to gym today).Marland Kitchen seeing ortho for right hip pain due to OA. Other complaints:   Family history of genetic test:  familial cholesterolemia.  Relevant past medical, surgical, family and social history reviewed and updated as indicated. Interim medical history since our last visit reviewed. Allergies and medications reviewed and updated. Outpatient Medications Prior to Visit  Medication Sig Dispense Refill   ACCU-CHEK AVIVA PLUS test strip USE TO CHECK BLOOD SUGAR 3  TIMES DAILY 300 strip 3   Accu-Chek Softclix Lancets lancets USE TO CHECK BLOOD SUGAR  THREE TIMES A DAY. 300 each 3   alendronate (FOSAMAX) 70 MG tablet TAKE 1 TABLET BY MOUTH  EVERY 7 DAYS WITH A FULL  GLASS OF WATER ON AN EMPTY  STOMACH 12 tablet 0   ALPRAZolam (XANAX) 0.5 MG tablet Take 0.5 mg by mouth 3 (three) times daily as needed for anxiety.      amphetamine-dextroamphetamine (ADDERALL) 7.5 MG tablet Take 7.5 mg by mouth 2  (two) times daily. Morning and after lunch     aspirin 81 MG EC tablet Take 81 mg by mouth daily. Swallow whole.      atorvastatin (LIPITOR) 40 MG tablet TAKE 1 TABLET BY MOUTH  DAILY 90 tablet 3   Blood Glucose Calibration (ACCU-CHEK GUIDE CONTROL) LIQD 1 each by In Vitro route daily as needed. 1 each 3   Blood Glucose Monitoring Suppl (ACCU-CHEK GUIDE ME) w/Device KIT 1 each by Does not apply route in the morning, at noon, and at bedtime. 1 kit 0   buPROPion (WELLBUTRIN XL) 150 MG 24 hr tablet TAKE 1 TABLET BY MOUTH  DAILY 90 tablet 2   CALCIUM-VITAMIN D PO Take 600 mg by mouth 2 (two) times a day.      diclofenac Sodium (VOLTAREN) 1 % GEL Apply 2 g topically 4 (four) times daily as needed.     DULoxetine (CYMBALTA) 60 MG capsule TAKE 2 CAPSULES BY MOUTH  DAILY 180 capsule 2   fexofenadine (ALLEGRA ALLERGY) 180 MG tablet Take 1 tablet (180 mg total) by mouth daily.     gabapentin (NEURONTIN) 600 MG tablet TAKE ONE-HALF TABLET BY  MOUTH IN THE MORNING AND  AFTER LUNCH THEN TAKE 2  TABLETS AT BEDTIME 270 tablet 3   meloxicam (MOBIC) 15 MG tablet TAKE 1 TABLET BY MOUTH  DAILY 90 tablet 3   Multiple Vitamin (MULTIVITAMIN) tablet Take 1 tablet by mouth daily.     Omega-3 Fatty Acids (FISH OIL) 1200 MG CAPS Take 1,200 mg by mouth daily.      pantoprazole (PROTONIX) 40 MG tablet TAKE 1 TABLET BY MOUTH  DAILY 90 tablet 3   azelastine (ASTELIN) 0.1 % nasal spray Place 2 sprays into both nostrils 2 (two) times daily. Use in each nostril as directed 30 mL 12   b complex vitamins capsule Take 1 capsule by mouth every 3 (three) days.     No facility-administered medications prior to visit.     Per HPI unless specifically indicated in ROS section below Review of Systems  Constitutional:  Negative for fatigue and fever.  HENT:  Negative for ear pain.   Eyes:  Negative for pain.  Respiratory:  Negative for chest tightness and shortness of breath.   Cardiovascular:  Negative for chest pain, palpitations  and leg swelling.  Gastrointestinal:  Negative for abdominal pain.  Genitourinary:  Negative for dysuria.  Objective:  BP 120/72    Pulse 91    Temp 97.6 F (36.4 C) (Temporal)    Ht 5' 1"  (1.549 m)    Wt 159 lb 8 oz (72.3 kg)    SpO2 91%    BMI 30.14 kg/m   Wt Readings from Last 3 Encounters:  10/04/21 159 lb 8 oz (72.3 kg)  03/26/21 155 lb 12 oz (70.6 kg)  12/18/20 154 lb 12 oz (70.2 kg)      Physical Exam Constitutional:      General: She is not in acute distress.    Appearance: Normal appearance. She is well-developed. She is not ill-appearing or toxic-appearing.  HENT:     Head: Normocephalic.     Right Ear: Hearing, tympanic membrane, ear canal and external ear normal. Tympanic membrane is not erythematous, retracted or bulging.     Left Ear: Hearing, tympanic membrane, ear canal and external ear normal. Tympanic membrane is not erythematous, retracted or bulging.     Nose: No mucosal edema or rhinorrhea.     Right Sinus: No maxillary sinus tenderness or frontal sinus tenderness.     Left Sinus: No maxillary sinus tenderness or frontal sinus tenderness.     Mouth/Throat:     Pharynx: Uvula midline.  Eyes:     General: Lids are normal. Lids are everted, no foreign bodies appreciated.     Conjunctiva/sclera: Conjunctivae normal.     Pupils: Pupils are equal, round, and reactive to light.  Neck:     Thyroid: No thyroid mass or thyromegaly.     Vascular: No carotid bruit.     Trachea: Trachea normal.  Cardiovascular:     Rate and Rhythm: Normal rate and regular rhythm.     Pulses: Normal pulses.     Heart sounds: Normal heart sounds, S1 normal and S2 normal. No murmur heard.   No friction rub. No gallop.  Pulmonary:     Effort: Pulmonary effort is normal. No tachypnea or respiratory distress.     Breath sounds: Normal breath sounds. No decreased breath sounds, wheezing, rhonchi or rales.  Abdominal:     General: Bowel sounds are normal.     Palpations: Abdomen is soft.      Tenderness: There is no abdominal tenderness.  Musculoskeletal:     Cervical back: Normal range of motion and neck supple.  Skin:    General: Skin is warm and dry.  Findings: No rash.  Neurological:     Mental Status: She is alert.  Psychiatric:        Mood and Affect: Mood is not anxious or depressed.        Speech: Speech normal.        Behavior: Behavior normal. Behavior is cooperative.        Thought Content: Thought content normal.        Judgment: Judgment normal.      Results for orders placed or performed in visit on 09/27/21  Microalbumin / creatinine urine ratio  Result Value Ref Range   Microalb, Ur <0.7 0.0 - 1.9 mg/dL   Creatinine,U 93.0 mg/dL   Microalb Creat Ratio 0.8 0.0 - 30.0 mg/g  Comprehensive metabolic panel  Result Value Ref Range   Sodium 138 135 - 145 mEq/L   Potassium 3.9 3.5 - 5.1 mEq/L   Chloride 102 96 - 112 mEq/L   CO2 28 19 - 32 mEq/L   Glucose, Bld 126 (H) 70 - 99 mg/dL   BUN 16 6 - 23 mg/dL   Creatinine, Ser 0.77 0.40 - 1.20 mg/dL   Total Bilirubin 0.7 0.2 - 1.2 mg/dL   Alkaline Phosphatase 76 39 - 117 U/L   AST 19 0 - 37 U/L   ALT 19 0 - 35 U/L   Total Protein 7.4 6.0 - 8.3 g/dL   Albumin 4.3 3.5 - 5.2 g/dL   GFR 79.59 >60.00 mL/min   Calcium 9.2 8.4 - 10.5 mg/dL  Lipid panel  Result Value Ref Range   Cholesterol 126 0 - 200 mg/dL   Triglycerides 112.0 0.0 - 149.0 mg/dL   HDL 43.20 >39.00 mg/dL   VLDL 22.4 0.0 - 40.0 mg/dL   LDL Cholesterol 60 0 - 99 mg/dL   Total CHOL/HDL Ratio 3    NonHDL 82.47   Hemoglobin A1c  Result Value Ref Range   Hgb A1c MFr Bld 6.9 (H) 4.6 - 6.5 %    This visit occurred during the SARS-CoV-2 public health emergency.  Safety protocols were in place, including screening questions prior to the visit, additional usage of staff PPE, and extensive cleaning of exam room while observing appropriate contact time as indicated for disinfecting solutions.   COVID 19 screen:  No recent travel or known  exposure to COVID19 The patient denies respiratory symptoms of COVID 19 at this time. The importance of social distancing was discussed today.   Assessment and Plan    Problem List Items Addressed This Visit     Hyperlipidemia associated with type 2 diabetes mellitus ([place]) (Chronic)    Stable, chronic.  Continue current medication.   Atorvastatin 40 mg daily      Hypertension associated with diabetes ([place]) (Chronic)    Well controlled on no medication.       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  Diet controlled.  Encouraged exercise, weight loss, healthy eating habits.         [name], MD

## 2021-10-04 NOTE — Patient Instructions (Signed)
Keep working on healthy eating and regular exercise as tolerate.

## 2021-10-25 NOTE — Assessment & Plan Note (Signed)
Well controlled on no medication  

## 2021-10-25 NOTE — Assessment & Plan Note (Signed)
Stable, chronic.  Continue current medication.   Atorvastatin 40 mg daily. 

## 2021-10-25 NOTE — Assessment & Plan Note (Signed)
Stable control, chronic.  Diet controlled.  Encouraged exercise, weight loss, healthy eating habits.

## 2021-11-10 ENCOUNTER — Other Ambulatory Visit: Payer: Self-pay | Admitting: Family Medicine

## 2021-11-30 ENCOUNTER — Other Ambulatory Visit: Payer: Self-pay | Admitting: Family Medicine

## 2021-11-30 DIAGNOSIS — E1140 Type 2 diabetes mellitus with diabetic neuropathy, unspecified: Secondary | ICD-10-CM

## 2021-12-04 ENCOUNTER — Encounter: Payer: Self-pay | Admitting: Family Medicine

## 2021-12-12 ENCOUNTER — Ambulatory Visit: Payer: Medicare Other

## 2022-01-01 ENCOUNTER — Encounter: Payer: Self-pay | Admitting: Family Medicine

## 2022-01-02 ENCOUNTER — Ambulatory Visit: Payer: Medicare Other

## 2022-01-14 ENCOUNTER — Ambulatory Visit (INDEPENDENT_AMBULATORY_CARE_PROVIDER_SITE_OTHER): Payer: Medicare Other | Admitting: Family Medicine

## 2022-01-14 ENCOUNTER — Encounter: Payer: Self-pay | Admitting: Family Medicine

## 2022-01-14 VITALS — BP 152/84 | HR 84 | Temp 98.2°F | Ht 61.0 in | Wt 157.6 lb

## 2022-01-14 DIAGNOSIS — M4802 Spinal stenosis, cervical region: Secondary | ICD-10-CM | POA: Diagnosis not present

## 2022-01-14 DIAGNOSIS — M62838 Other muscle spasm: Secondary | ICD-10-CM | POA: Insufficient documentation

## 2022-01-14 DIAGNOSIS — G43009 Migraine without aura, not intractable, without status migrainosus: Secondary | ICD-10-CM | POA: Diagnosis not present

## 2022-01-14 MED ORDER — CYCLOBENZAPRINE HCL 10 MG PO TABS: 5.0000 mg | ORAL_TABLET | Freq: Every evening | ORAL | 0 refills | Status: DC | PRN

## 2022-01-14 NOTE — Assessment & Plan Note (Addendum)
Acute on chronic cervical neck pain from spinal stenosis. ? ?No red flags and no current radiculopathy. ? ?We will treat with thoracic back stretches, heat continued anti-inflammatory and start muscle relaxant at night.  Hopefully a secondary benefit of the muscle relaxant will be helping her sleep better at night to get on a better sleep schedule.  Hopefully this will also help with her migraines. ?

## 2022-01-14 NOTE — Assessment & Plan Note (Addendum)
Acute flare of chronic issue, poorly controlled ?Normal neurological exam ?No red flags or indication for imaging at this time. ? ?She will keep headache diary and will review trigger list.She is currently on an SSRI, gabapentin and an anti-inflammatory. ?He states that she has poor sleep at night and this could be triggering her migraines.  I recommended healthy lifestyle. ? ?If her headaches do not seem to improve with these changes over the next 4 weeks we can consider additional migraine preventative but as topiramate versus neurology referral. ?

## 2022-01-14 NOTE — Progress Notes (Signed)
? ? Patient ID: Lisa Odom, female    DOB: Feb 23, 1956, 66 y.o.   MRN: [id number] ? ?This visit was conducted in person. ? ?BP (!) 152/84   Pulse 84   Temp 98.2 ?F (36.8 ?C) (Oral)   Ht 5' 1"  (1.549 m)   Wt 157 lb 9 oz (71.5 kg)   SpO2 96%   BMI 29.77 kg/m?   ? ?CC:  ?Chief Complaint  ?Patient presents with  ? Migraine  ? Spasms  ? ? ?Subjective:  ? ?HPI: ?Lisa Odom is a 66 y.o. female presenting on 01/14/2022 for Migraine and Spasms ? ?She has extensive history of fibromyalgia, chronic cervical back pain (spinal stenosis of cervical spine) and chronic migraines. ? ?She report her migraine started back  up in last 6 weeks.  Bilateral frontal headache, 8/10 on pain scale. Pulling pain. ? Some photo phonophobia. No nausea. ? Has occurred 4 times a week lasting  6-8 hour ? Improves when she goes to bed. ?Also upper back spasm have been ongoing x 3 month. ? No change in activity. No recent falls. ? No radiation of pain from neck. ? She has trouble sleeping at night ?   ?She is on Cymbalta 120 mg daily as well as Wellbutrin 150 mg daily for major depressive disorder, anxiety and chronic pain. Seeing psychiatry. ?She uses gabapentin 300 mg in the morning, midday and 1200 mg at night. ?He uses meloxicam 15 mg daily as well as topical diclofenac gel for arthritis pain ?It is a difficult situation given she is on multiple medications that could potentially be contributing to her balance issues memory issues, but pain remains poorly controlled.  ? ?She last saw Dr. Brien Few for facet joint injections and lumbar spine in March 2022. ?She has also seen Dr. Posey Pronto with neurology in the past for her memory issues and cognitive impairment.  ?At that time neurocognitive testing suggested her subjective cognitive complaints were likely due to depression. ?[place] Office Visit from 10/04/2021 in [facility]  ?PHQ-2 Total Score 2  ? ?  ? ? ? ?Relevant past medical, surgical, family and  social history reviewed and updated as indicated. Interim medical history since our last visit reviewed. ?Allergies and medications reviewed and updated. ?Outpatient Medications Prior to Visit  ?Medication Sig Dispense Refill  ? ACCU-CHEK AVIVA PLUS test strip USE TO CHECK BLOOD SUGAR 3  TIMES DAILY 300 strip 3  ? Accu-Chek Softclix Lancets lancets USE TO CHECK BLOOD SUGAR  THREE TIMES A DAY. 300 each 3  ? alendronate (FOSAMAX) 70 MG tablet TAKE 1 TABLET BY MOUTH WEEKLY  WITH 8 OZ OF PLAIN WATER 30  MINUTES BEFORE FIRST FOOD, DRINK OR MEDS. STAY UPRIGHT FOR 30  MINS 12 tablet 1  ? ALPRAZolam (XANAX) 0.5 MG tablet Take 0.5 mg by mouth 3 (three) times daily as needed for anxiety.     ? amphetamine-dextroamphetamine (ADDERALL) 7.5 MG tablet Take 7.5 mg by mouth 2 (two) times daily. Morning and after lunch    ? atorvastatin (LIPITOR) 40 MG tablet TAKE 1 TABLET BY MOUTH  DAILY 90 tablet 3  ? Blood Glucose Calibration (ACCU-CHEK GUIDE CONTROL) LIQD 1 each by In Vitro route daily as needed. 1 each 3  ? Blood Glucose Monitoring Suppl (ACCU-CHEK GUIDE ME) w/Device KIT 1 each by Does not apply route in the morning, at noon, and at bedtime. 1 kit 0  ? buPROPion (WELLBUTRIN XL) 150 MG 24 hr tablet TAKE 1  TABLET BY MOUTH  DAILY 90 tablet 2  ? CALCIUM-VITAMIN D PO Take 600 mg by mouth 2 (two) times a day.     ? diclofenac Sodium (VOLTAREN) 1 % GEL Apply 2 g topically 4 (four) times daily as needed.    ? DULoxetine (CYMBALTA) 60 MG capsule TAKE 2 CAPSULES BY MOUTH  DAILY 180 capsule 2  ? fexofenadine (ALLEGRA ALLERGY) 180 MG tablet Take 1 tablet (180 mg total) by mouth daily.    ? gabapentin (NEURONTIN) 600 MG tablet TAKE ONE-HALF TABLET BY  MOUTH IN THE MORNING AND  AFTER LUNCH THEN TAKE 2  TABLETS AT BEDTIME 270 tablet 3  ? meloxicam (MOBIC) 15 MG tablet TAKE 1 TABLET BY MOUTH  DAILY 90 tablet 3  ? Multiple Vitamin (MULTIVITAMIN) tablet Take 1 tablet by mouth daily.    ? Omega-3 Fatty Acids (FISH OIL) 1200 MG CAPS Take 1,200 mg  by mouth daily.     ? pantoprazole (PROTONIX) 40 MG tablet TAKE 1 TABLET BY MOUTH  DAILY 90 tablet 3  ? aspirin 81 MG EC tablet Take 81 mg by mouth daily. Swallow whole.     ? ?No facility-administered medications prior to visit.  ?  ? ?Per HPI unless specifically indicated in ROS section below ?Review of Systems  ?Constitutional:  Negative for fatigue and fever.  ?HENT:  Negative for congestion.   ?Eyes:  Negative for pain.  ?Respiratory:  Negative for cough and shortness of breath.   ?Cardiovascular:  Negative for chest pain, palpitations and leg swelling.  ?Gastrointestinal:  Negative for abdominal pain.  ?Genitourinary:  Negative for dysuria and vaginal bleeding.  ?Musculoskeletal:  Negative for back pain.  ?Neurological:  Negative for syncope, light-headedness and headaches.  ?Psychiatric/Behavioral:  Negative for dysphoric mood.   ?Objective:  ?BP (!) 152/84   Pulse 84   Temp 98.2 ?F (36.8 ?C) (Oral)   Ht 5' 1"  (1.549 m)   Wt 157 lb 9 oz (71.5 kg)   SpO2 96%   BMI 29.77 kg/m?   ?Wt Readings from Last 3 Encounters:  ?01/14/22 157 lb 9 oz (71.5 kg)  ?10/04/21 159 lb 8 oz (72.3 kg)  ?03/26/21 155 lb 12 oz (70.6 kg)  ?  ?  ?Physical Exam ?Constitutional:   ?   General: She is not in acute distress. ?   Appearance: Normal appearance. She is well-developed. She is not ill-appearing or toxic-appearing.  ?HENT:  ?   Head: Normocephalic.  ?   Right Ear: Hearing, tympanic membrane, ear canal and external ear normal. Tympanic membrane is not erythematous, retracted or bulging.  ?   Left Ear: Hearing, tympanic membrane, ear canal and external ear normal. Tympanic membrane is not erythematous, retracted or bulging.  ?   Nose: No mucosal edema or rhinorrhea.  ?   Right Sinus: No maxillary sinus tenderness or frontal sinus tenderness.  ?   Left Sinus: No maxillary sinus tenderness or frontal sinus tenderness.  ?   Mouth/Throat:  ?   Pharynx: Uvula midline.  ?Eyes:  ?   General: Lids are normal. Lids are everted, no  foreign bodies appreciated.  ?   Conjunctiva/sclera: Conjunctivae normal.  ?   Pupils: Pupils are equal, round, and reactive to light.  ?Neck:  ?   Thyroid: No thyroid mass or thyromegaly.  ?   Vascular: No carotid bruit.  ?   Trachea: Trachea normal.  ?Cardiovascular:  ?   Rate and Rhythm: Normal rate and regular rhythm.  ?  Pulses: Normal pulses.  ?   Heart sounds: Normal heart sounds, S1 normal and S2 normal. No murmur heard. ?  No friction rub. No gallop.  ?Pulmonary:  ?   Effort: Pulmonary effort is normal. No tachypnea or respiratory distress.  ?   Breath sounds: Normal breath sounds. No decreased breath sounds, wheezing, rhonchi or rales.  ?Abdominal:  ?   General: Bowel sounds are normal.  ?   Palpations: Abdomen is soft.  ?   Tenderness: There is no abdominal tenderness.  ?Musculoskeletal:  ?   Right shoulder: Tenderness present. No bony tenderness.  ?   Left shoulder: Tenderness present. No bony tenderness.  ?   Cervical back: Neck supple. Spasms and tenderness present. No torticollis or bony tenderness. Pain with movement present. Decreased range of motion.  ?Skin: ?   General: Skin is warm and dry.  ?   Findings: No rash.  ?Neurological:  ?   Mental Status: She is alert and oriented to person, place, and time.  ?   GCS: GCS eye subscore is 4. GCS verbal subscore is 5. GCS motor subscore is 6.  ?   Cranial Nerves: No cranial nerve deficit.  ?   Sensory: No sensory deficit.  ?   Motor: No abnormal muscle tone.  ?   Coordination: Coordination normal.  ?   Gait: Gait normal.  ?   Deep Tendon Reflexes: Reflexes are normal and symmetric.  ?   Comments: Nml cerebellar exam ?  ?No papilledema  ?Psychiatric:     ?   Mood and Affect: Mood is not anxious or depressed.     ?   Speech: Speech normal.     ?   Behavior: Behavior normal. Behavior is cooperative.     ?   Thought Content: Thought content normal.     ?   Cognition and Memory: Memory is not impaired. She does not exhibit impaired recent memory or impaired  remote memory.     ?   Judgment: Judgment normal.  ? ?   ?Results for orders placed or performed in visit on 09/27/21  ?Microalbumin / creatinine urine ratio  ?Result Value Ref Range  ? Microalb, Ur <0.7 0.0 - 1.9 mg/d

## 2022-01-14 NOTE — Patient Instructions (Addendum)
Start muscle relaxant at night for muscle spasms. ? Start upper back stretching 2 times daily. ? Keep headache diary, look for triggers. ? Work on increase water, adequate sleep, stress reduction, no skipping meals... healthy lifestyle. ? ?

## 2022-01-24 ENCOUNTER — Other Ambulatory Visit: Payer: Self-pay | Admitting: Family Medicine

## 2022-01-24 DIAGNOSIS — G894 Chronic pain syndrome: Secondary | ICD-10-CM

## 2022-01-27 ENCOUNTER — Telehealth: Payer: Self-pay | Admitting: Family Medicine

## 2022-01-27 NOTE — Telephone Encounter (Signed)
LVM for pt to rtn my call to schedule AWV with [facility]. Please schedule if pt calls the office.  ?

## 2022-02-17 ENCOUNTER — Telehealth: Payer: Self-pay | Admitting: Family Medicine

## 2022-02-17 NOTE — Telephone Encounter (Signed)
Left message for patient to call back and schedule Medicare Annual Wellness Visit (AWV) either virtually or phone   Last AWV ;12/11/20  please schedule at anytime with health coach  I left my direct # [phone]

## 2022-02-21 ENCOUNTER — Telehealth: Payer: Self-pay

## 2022-02-21 NOTE — Telephone Encounter (Signed)
This nurse attempted to call patient numerous times for telephonic AWV. Phone rang once then the busy signal.

## 2022-03-05 DIAGNOSIS — M65342 Trigger finger, left ring finger: Secondary | ICD-10-CM | POA: Diagnosis not present

## 2022-03-11 ENCOUNTER — Telehealth: Payer: Self-pay

## 2022-03-11 NOTE — Telephone Encounter (Signed)
This nurse attempted to call patient three times. Call rings once and goes to voicemail. Message left that we will call to reschedule for another time.

## 2022-03-13 NOTE — Progress Notes (Signed)
Acute Office Visit  Subjective:     Patient ID: Lisa Odom, female    DOB: Dec 28, 1955, 67 y.o.   MRN: [id number]  Chief Complaint  Patient presents with   Sore Throat    Pt c/o sore throat, hoarseness and left ear soreness x6 days. At-home covid test neg 03/12/22    HPI Patient is in today for sore throat for the past 6 days. Home covid-19 test is negative.   UPPER RESPIRATORY TRACT INFECTION  Fever: no - but feels warm Cough: yes Shortness of breath: no Wheezing: no Chest pain: no Chest tightness: no Chest congestion: no Nasal congestion: yes Runny nose: yes Post nasal drip: yes Sneezing: no Sore throat: yes Swollen glands: no Sinus pressure: yes Headache: no Face pain: no Toothache: no Ear pain: yes "right Ear pressure: no bilateral Eyes red/itching:no Eye drainage/crusting: no  Vomiting: no Rash: no Fatigue: yes Sick contacts: no Strep contacts: no  Context: stable Recurrent sinusitis: no Relief with OTC cold/cough medications: no  Treatments attempted: guaifenesin, allegra, nasal spray  ROS See pertinent positives and negatives per HPI.     Objective:    BP (!) 142/90 (BP Location: Right Arm, Cuff Size: Normal)   Pulse 85   Temp (!) 96.3 F (35.7 C) (Temporal)   Wt 155 lb 12.8 oz (70.7 kg)   SpO2 94%   BMI 29.44 kg/m    Physical Exam Vitals and nursing note reviewed.  Constitutional:      General: She is not in acute distress.    Appearance: Normal appearance.  HENT:     Head: Normocephalic.     Right Ear: Tympanic membrane, ear canal and external ear normal.     Left Ear: Tympanic membrane, ear canal and external ear normal.     Mouth/Throat:     Pharynx: Posterior oropharyngeal erythema present. No oropharyngeal exudate.  Eyes:     Conjunctiva/sclera: Conjunctivae normal.  Cardiovascular:     Rate and Rhythm: Normal rate and regular rhythm.     Pulses: Normal pulses.     Heart sounds: Normal heart sounds.  Pulmonary:      Effort: Pulmonary effort is normal.     Breath sounds: Normal breath sounds.  Musculoskeletal:     Cervical back: Normal range of motion and neck supple. Tenderness present.  Lymphadenopathy:     Cervical: No cervical adenopathy.  Skin:    General: Skin is warm.  Neurological:     General: No focal deficit present.     Mental Status: She is alert and oriented to person, place, and time.  Psychiatric:        Mood and Affect: Mood normal.        Behavior: Behavior normal.        Thought Content: Thought content normal.        Judgment: Judgment normal.     Results for orders placed or performed in visit on 03/14/22  POCT rapid strep A  Result Value Ref Range   Rapid Strep A Screen Positive (A) Negative        Assessment & Plan:   Problem List Items Addressed This Visit   None Visit Diagnoses     Strep pharyngitis    -  Primary   Will treat with amoxicillin BID x 10 days. Encourage fluids and rest.    Sore throat       POC strep A positive in the office today. She can continue OTC nasal spray and allegra.  Encouraged fluids, gargle warm salt water. F/U if not improving   Relevant Orders   POCT rapid strep A (Completed)       Meds ordered this encounter  Medications   amoxicillin (AMOXIL) 500 MG capsule    Sig: Take 1 capsule (500 mg total) by mouth 2 (two) times daily for 10 days.    Dispense:  20 capsule    Refill:  0    Return if symptoms worsen or fail to improve.  Charyl Dancer, NP

## 2022-03-14 ENCOUNTER — Ambulatory Visit (INDEPENDENT_AMBULATORY_CARE_PROVIDER_SITE_OTHER): Payer: Medicare Other | Admitting: Nurse Practitioner

## 2022-03-14 ENCOUNTER — Encounter: Payer: Self-pay | Admitting: Nurse Practitioner

## 2022-03-14 VITALS — BP 142/90 | HR 85 | Temp 96.3°F | Wt 155.8 lb

## 2022-03-14 DIAGNOSIS — J029 Acute pharyngitis, unspecified: Secondary | ICD-10-CM | POA: Diagnosis not present

## 2022-03-14 DIAGNOSIS — J020 Streptococcal pharyngitis: Secondary | ICD-10-CM

## 2022-03-14 LAB — POCT RAPID STREP A (OFFICE): Rapid Strep A Screen: POSITIVE — AB

## 2022-03-14 MED ORDER — AMOXICILLIN 500 MG PO CAPS: 500.0000 mg | ORAL_CAPSULE | Freq: Two times a day (BID) | ORAL | 0 refills | Status: AC

## 2022-03-14 NOTE — Patient Instructions (Signed)
It was great to see you!  Start amoxicillin 1 capsule twice a day with food for 10 days.   You can continue your nasal spray, allegra, and guaifenesin. Drink plenty of fluids and get rest.   Let's follow-up if your symptoms worsen or don't improve  Take care,  Vance Peper, NP

## 2022-03-16 ENCOUNTER — Other Ambulatory Visit: Payer: Self-pay | Admitting: Family Medicine

## 2022-03-16 ENCOUNTER — Encounter: Payer: Self-pay | Admitting: *Deleted

## 2022-03-16 NOTE — Telephone Encounter (Addendum)
Last office visit 01/14/22 for Migraines and Spasms.  Last refilled 05/21/21 for #90 with 3 refills.  AVS stated to follow up in 4 weeks on  migraines.  No future appointments.   Last CPE was 12/18/20.  Refill?

## 2022-03-31 ENCOUNTER — Encounter: Payer: Self-pay | Admitting: Family Medicine

## 2022-04-11 DIAGNOSIS — Z1231 Encounter for screening mammogram for malignant neoplasm of breast: Secondary | ICD-10-CM | POA: Diagnosis not present

## 2022-04-11 LAB — HM MAMMOGRAPHY

## 2022-04-14 ENCOUNTER — Encounter: Payer: Self-pay | Admitting: Family Medicine

## 2022-04-21 ENCOUNTER — Ambulatory Visit (INDEPENDENT_AMBULATORY_CARE_PROVIDER_SITE_OTHER): Payer: Medicare Other

## 2022-04-21 ENCOUNTER — Ambulatory Visit: Payer: Medicare Other | Admitting: Podiatry

## 2022-04-21 ENCOUNTER — Other Ambulatory Visit: Payer: Self-pay | Admitting: Family Medicine

## 2022-04-21 ENCOUNTER — Encounter: Payer: Self-pay | Admitting: Podiatry

## 2022-04-21 DIAGNOSIS — M778 Other enthesopathies, not elsewhere classified: Secondary | ICD-10-CM

## 2022-04-21 DIAGNOSIS — L603 Nail dystrophy: Secondary | ICD-10-CM | POA: Diagnosis not present

## 2022-04-21 DIAGNOSIS — B351 Tinea unguium: Secondary | ICD-10-CM | POA: Diagnosis not present

## 2022-04-21 DIAGNOSIS — M2011 Hallux valgus (acquired), right foot: Secondary | ICD-10-CM | POA: Diagnosis not present

## 2022-04-21 DIAGNOSIS — E1140 Type 2 diabetes mellitus with diabetic neuropathy, unspecified: Secondary | ICD-10-CM

## 2022-04-21 MED ORDER — ACCU-CHEK GUIDE ME W/DEVICE KIT: 1.0000 | PACK | Freq: Three times a day (TID) | 0 refills | Status: DC

## 2022-04-21 NOTE — Progress Notes (Signed)
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 female presents with the above complaint.   ROS: Denies fever chills nausea vomiting muscle aches pains calf pain back pain chest pain shortness of breath.  Past Medical History:  Diagnosis Date   Anxiety    Arthritis    Contact lens/glasses fitting    wears contacts or glasses   Depression    Diabetes mellitus without complication ([place])    No meds   Diverticulosis    Family history of adverse reaction to anesthesia    pts sister has severe N/V   Fibromyalgia    Gastritis    GERD (gastroesophageal reflux disease)    Hemorrhoid    Hiatal hernia    neuropathy   Migraine    Neuropathy    Panic attacks    Pneumonia    as an infant   Past Surgical History:  Procedure Laterality Date   ANTERIOR CERVICAL DECOMP/DISCECTOMY FUSION N/A 09/12/2014   Procedure: Evacuation of cervical hematoma;  Surgeon: [name], MD;  Location: [facility];  Service: Neurosurgery;  Laterality: N/A;   ANTERIOR CERVICAL DECOMP/DISCECTOMY FUSION N/A 09/12/2014   Procedure: CERVICAL FOUR-FIVE, CERVICAL FIVE-SIX, CERVICAL SIX-SEVEN ANTERIOR CERVICAL DECOMPRESSION/DISCECTOMY FUSION;  Surgeon: [name], MD;  Location: [facility];  Service: Neurosurgery;  Laterality: N/A;   BUNIONECTOMY     CARPAL TUNNEL RELEASE  2002   right   COLONOSCOPY     JOINT REPLACEMENT Left    SHOULDER ARTHROSCOPY WITH ROTATOR CUFF REPAIR  2010   right   TOTAL HIP ARTHROPLASTY Left 11/20/2017   Procedure: LEFT TOTAL HIP ARTHROPLASTY ANTERIOR APPROACH WITH BONE GRAFTING AND ACETABULAR SCREWS;  Surgeon: Dorna Leitz, MD;  Location: [facility];  Service: Orthopedics;  Laterality: Left;   TOTAL KNEE ARTHROPLASTY  Right 03/11/2019   Procedure: TOTAL KNEE ARTHROPLASTY;  Surgeon: Dorna Leitz, MD;  Location: [facility];  Service: Orthopedics;  Laterality: Right;   TRIGGER FINGER RELEASE Right 03/23/2013   Procedure: RELEASE TRIGGER FINGER/A-1 PULLEY RIGHT RING FINGER;  Surgeon: [name], MD;  Location: [facility];  Service: Orthopedics;  Laterality: Right;   TUBAL LIGATION     UPPER GI ENDOSCOPY      Current Outpatient Medications:    ACCU-CHEK AVIVA PLUS test strip, USE TO CHECK BLOOD SUGAR 3  TIMES DAILY, Disp: 300 strip, Rfl: 3   Accu-Chek Softclix Lancets lancets, USE TO CHECK BLOOD SUGAR  THREE TIMES A DAY., Disp: 300 each, Rfl: 3   alendronate (FOSAMAX) 70 MG tablet, TAKE 1 TABLET BY MOUTH WEEKLY  WITH 8 OZ OF PLAIN WATER 30  MINUTES BEFORE FIRST FOOD, DRINK OR MEDS. STAY UPRIGHT FOR 30  MINS, Disp: 12 tablet, Rfl: 3   ALPRAZolam (XANAX) 0.5 MG tablet, Take 0.5 mg by mouth 3 (three) times daily as needed for anxiety. , Disp: , Rfl:    amphetamine-dextroamphetamine (ADDERALL) 7.5 MG tablet, Take 7.5 mg by mouth 2 (two) times daily. Morning and after lunch, Disp: , Rfl:    atorvastatin (LIPITOR) 40 MG tablet, TAKE 1 TABLET BY MOUTH  DAILY, Disp: 90 tablet, Rfl: 3   Blood Glucose Calibration (  ACCU-CHEK GUIDE CONTROL) LIQD, 1 each by In Vitro route daily as needed., Disp: 1 each, Rfl: 3   Blood Glucose Monitoring Suppl (ACCU-CHEK GUIDE ME) w/Device KIT, 1 each by Does not apply route in the morning, at noon, and at bedtime., Disp: 1 kit, Rfl: 0   buPROPion (WELLBUTRIN XL) 150 MG 24 hr tablet, TAKE 1 TABLET BY MOUTH  DAILY, Disp: 90 tablet, Rfl: 2   CALCIUM-VITAMIN D PO, Take 600 mg by mouth 2 (two) times a day. , Disp: , Rfl:    cyclobenzaprine (FLEXERIL) 10 MG tablet, Take 0.5-1 tablets (5-10 mg total) by mouth at bedtime as needed for muscle spasms., Disp: 15 tablet, Rfl: 0   diclofenac Sodium (VOLTAREN) 1 % GEL, Apply 2 g topically 4 (four) times daily as needed., Disp: , Rfl:     DULoxetine (CYMBALTA) 60 MG capsule, TAKE 2 CAPSULES BY MOUTH  DAILY, Disp: 180 capsule, Rfl: 2   fexofenadine (ALLEGRA ALLERGY) 180 MG tablet, Take 1 tablet (180 mg total) by mouth daily., Disp: , Rfl:    gabapentin (NEURONTIN) 600 MG tablet, TAKE ONE-HALF TABLET BY  MOUTH IN THE MORNING AND  AFTER LUNCH THEN TAKE 2  TABLETS AT BEDTIME, Disp: 270 tablet, Rfl: 3   meloxicam (MOBIC) 15 MG tablet, TAKE 1 TABLET BY MOUTH  DAILY, Disp: 90 tablet, Rfl: 3   Multiple Vitamin (MULTIVITAMIN) tablet, Take 1 tablet by mouth daily., Disp: , Rfl:    Omega-3 Fatty Acids (FISH OIL) 1200 MG CAPS, Take 1,200 mg by mouth daily. , Disp: , Rfl:    pantoprazole (PROTONIX) 40 MG tablet, TAKE 1 TABLET BY MOUTH  DAILY, Disp: 90 tablet, Rfl: 3   Vilazodone HCl 20 MG TABS, SMARTSIG:1 Tablet(s) By Mouth Every Evening, Disp: , Rfl:   Allergies  Allergen Reactions   Hydrochlorothiazide Rash   Sulfonamide Derivatives Rash   Review of Systems Objective:  There were no vitals filed for this visit.  General: Well developed, nourished, in no acute distress, alert and oriented x3   Dermatological: Skin is warm, dry and supple bilateral. Nails x 10 are well maintained; remaining integument appears unremarkable at this time. There are no open sores, no preulcerative lesions, no rash or signs of infection present.  Toenails demonstrate thickening discoloration.  Vascular: Dorsalis Pedis artery and Posterior Tibial artery pedal pulses are 2/4 bilateral with immedate capillary fill time. Pedal hair growth present. No varicosities and no lower extremity edema present bilateral.   Neruologic: Grossly intact via light touch bilateral. Vibratory intact via tuning fork bilateral. Protective threshold with Semmes Wienstein monofilament intact to all pedal sites bilateral. Patellar and Achilles deep tendon reflexes 2+ bilateral. No Babinski or clonus noted bilateral.   Musculoskeletal: No gross boney pedal deformities bilateral. No  pain, crepitus, or limitation noted with foot and ankle range of motion bilateral. Muscular strength 5/5 in all groups tested bilateral.  She has pain across the dorsal aspect of the bilateral foot.  She has pain around the first metatarsal phalangeal joint and the hallux interphalangeal joint.  She states that the pain around the hallux interphalangeal joint is not reproducible and is intermittent.  Does relate a history of back pain.  Gait: Unassisted, Nonantalgic.    Radiographs:  Radiographs taken today demonstrate an osseously mature foot with significant metatarsus adductus bilateral right is worse than the left she also retains screws to the first metatarsal head left foot secondary to previous capital osteotomy.  Right foot does demonstrate significant osteoarthritic changes of  the midfoot and hallux valgus deformity.  Assessment & Plan:   Assessment: No dystrophy bilateral.  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  Painful bunion deformity bilateral and hallux interphalangeal bilateral  Plan: Took samples of skin and nail today to be sent for pathologic evaluation.  I discussed with her in great detail today surgical correction however I would like her to follow-up with Dr. Sherryle Lis to see if he thinks that she needs to have a correction of the metatarsus adductus with a possible fusion of the tarsometatarsal joints which would reduce the pain in the midfoot as well.  She will follow-up with me in 1 month and follow-up with him as well near that time.     Calem Cocozza T. [place], [place]

## 2022-04-22 ENCOUNTER — Telehealth: Payer: Self-pay | Admitting: Family Medicine

## 2022-04-22 NOTE — Telephone Encounter (Signed)
Left message for patient to call back and schedule Medicare Annual Wellness Visit (AWV) either virtually or phone   Last AWV  12/11/20   I left my direct # [phone]

## 2022-04-24 ENCOUNTER — Ambulatory Visit (INDEPENDENT_AMBULATORY_CARE_PROVIDER_SITE_OTHER): Payer: Medicare Other | Admitting: Family Medicine

## 2022-04-24 ENCOUNTER — Encounter: Payer: Self-pay | Admitting: Family Medicine

## 2022-04-24 VITALS — Ht 61.0 in | Wt 155.8 lb

## 2022-04-24 DIAGNOSIS — Z0000 Encounter for general adult medical examination without abnormal findings: Secondary | ICD-10-CM | POA: Diagnosis not present

## 2022-04-24 DIAGNOSIS — E1159 Type 2 diabetes mellitus with other circulatory complications: Secondary | ICD-10-CM

## 2022-04-24 DIAGNOSIS — E663 Overweight: Secondary | ICD-10-CM | POA: Diagnosis not present

## 2022-04-24 DIAGNOSIS — F339 Major depressive disorder, recurrent, unspecified: Secondary | ICD-10-CM

## 2022-04-24 NOTE — Progress Notes (Addendum)
MEDICARE WELLNESS VISIT PATIENT CHECK-IN and HEALTH RISK ASSESSMENT QUESTIONNAIRE:  -completed by phone/video for upcoming Medicare Preventive Visit  Pre-Visit Check-in: 1)Vitals (height, wt, BP, etc) - record in vitals section for visit on day of visit 2)Review and Update Medications, Allergies PMH, Surgeries, Social history in Epic 3)Hospitalizations in the last year with date/reason? NO  4)Review and Update Care Team (patient's specialists) in Epic 5) Complete PHQ9 in Epic  6) Complete Fall Screening in Epic 7)Review all Health Maintenance Due and order under PCP if not done.  8)Medicare Wellness Questionnaire: Answer theses question about your habits: Do you drink alcohol? No How many drinks do you have a day?N/A Have you ever smoked?No  Have you stopped smoking and date if applicable? N/A  How many packs a day do you smoke? N/A Do you use smokeless tobacco?No Do you use illicit drugs?No Do you exercises? Yes If so, what type and how many days/minutes per week?3-4 days per week 45 minutes Are you sexually active? No Number of partners?N/A What did you eat for breakfast today (or yesterday)?Peanut butter and Jelly Sandwich Typical breakfast Cereal What did you eat for lunch today (or yesterday)? Did not eat Typical lunch Vegetables What did you eat for diner today (or yesterday)? Tomato sandwich Typical dinner Vegetables and beans Typical snacks: Walnuts, peanuts, cheeze and crackers and fruit What beverages do you drink besides water: Unsweet tea  Answer theses question about you: Can you perform most household chores? Yes Do you find it hard to follow a conversation in a noisy room? No Do you find it hard to understand a speaker at church or in a meeting?No Do you often ask people to speak up or repeat themselves? No Do you experience ringing in your ears? Yes Do you have difficulty understanding a soft or whispered voice? No Do you feel that you have a problem with  memory? Yes Do you often misplace items? Yes Do you balance your checkbook and or bank acounts? Yes Do you feel safe at home? Yes Last dentist visit? 03/2022 Do you need assistance with any of the following:  Driving? No  Feeding yourself? No  Getting from bed to chair? No  Getting to the toilet?No  Bathing or showering? No  Dressing yourself? No  Managing money? No  Climbing a flight of stairs? No  Preparing meals? No  Do you have Advanced Directives in place (Living Will, Healthcare Power or Attorney)? No   Last eye Exam and location? 08/2021 [facility]   Do you currently use prescribed or non-prescribed narcotic or opioid pain medications? No  Do you have a history or close family history of breast, ovarian, tubal or peritoneal cancer or a family member with BRCA 1/2 (breast cancer susceptibility 1 and 2) gene mutations?  Yes  Nurse/Assistant Credentials/time stamp: 9:22 AM 04/24/2022 MG    ----------------------------------------------------------------------------------------------------------------------------------------------------------------------------------------------------------------------   MEDICARE ANNUAL PREVENTIVE VISIT WITH PROVIDER: (Welcome to [facility], initial annual wellness or annual wellness exam)  Virtual Visit via Video Note  I connected with Kylin  On 04/24/22 by telephone and verified that I am speaking with the correct person using two identifiers.  Location patient: home Location provider:work or home office Persons participating in the virtual visit: patient, provider  Concerns and/or follow up today:  Per pre visit questionnaire: Reports has had some ringing in her ears in the past - feels hearing is fine. Ringing in ears has improved. Does not have advanced directives in place. Interested and knows needs to complete -  is considering and plans to get notarized. Has a family history of breast cancer - she gets yearly mammograms  and does self breast exams. Denies BRACA genes. Had labs in December with HgbA1 c 6.9 and normal lipid panel. She is due for her diabetic foot exam (reports did foot exam on Monday), covid19 exam and hgba1c. She has appointment in a few weeks. She is trying to eat healthy - but sometimes has cravings. With craving eats ice cream, yogurt, nuts and fruit. She is keeping a Surveyor, mining and doing prayer.  She has a history of depression and anxiety, cognitive fog but her PHQ0 scores have improved some. She is seeing a psychiatrist for management and feels she is improving. On a number of psychiatric medications.    See HM section in Epic for other details of completed HM.    ROS: negative for report of fevers, unintentional weight loss, vision changes, vision loss, hearing loss or change, chest pain, sob, hemoptysis, melena, hematochezia, hematuria, genital discharge or lesions, falls, bleeding or bruising, loc, thoughts of suicide or self harm, memory loss  Patient-completed extensive health risk assessment - reviewed and discussed with the patient: See Health Risk Assessment completed with patient prior to the visit either above or in recent phone note. This was reviewed in detailed with the patient today and appropriate recommendations, orders and referrals were placed as needed per Summary below and patient instructions.   Review of Medical History: -PMH, PSH, Family History and current specialty and care providers reviewed and updated and listed below   Patient Care Team: [name], MD as PCP - General Debbora Dus, [facility] as Pharmacist (Pharmacist)   Past Medical History:  Diagnosis Date   Anxiety    Arthritis    Contact lens/glasses fitting    wears contacts or glasses   Depression    Diabetes mellitus without complication ([place])    No meds   Diverticulosis    Family history of adverse reaction to anesthesia    pts sister has severe N/V   Fibromyalgia    Gastritis    GERD  (gastroesophageal reflux disease)    Hemorrhoid    Hiatal hernia    neuropathy   Migraine    Neuropathy    Panic attacks    Pneumonia    as an infant    Past Surgical History:  Procedure Laterality Date   ANTERIOR CERVICAL DECOMP/DISCECTOMY FUSION N/A 09/12/2014   Procedure: Evacuation of cervical hematoma;  Surgeon: [name], MD;  Location: [facility];  Service: Neurosurgery;  Laterality: N/A;   ANTERIOR CERVICAL DECOMP/DISCECTOMY FUSION N/A 09/12/2014   Procedure: CERVICAL FOUR-FIVE, CERVICAL FIVE-SIX, CERVICAL SIX-SEVEN ANTERIOR CERVICAL DECOMPRESSION/DISCECTOMY FUSION;  Surgeon: [name], MD;  Location: [facility];  Service: Neurosurgery;  Laterality: N/A;   BUNIONECTOMY     CARPAL TUNNEL RELEASE  2002   right   COLONOSCOPY     JOINT REPLACEMENT Left    SHOULDER ARTHROSCOPY WITH ROTATOR CUFF REPAIR  2010   right   TOTAL HIP ARTHROPLASTY Left 11/20/2017   Procedure: LEFT TOTAL HIP ARTHROPLASTY ANTERIOR APPROACH WITH BONE GRAFTING AND ACETABULAR SCREWS;  Surgeon: Dorna Leitz, MD;  Location: [facility];  Service: Orthopedics;  Laterality: Left;   TOTAL KNEE ARTHROPLASTY Right 03/11/2019   Procedure: TOTAL KNEE ARTHROPLASTY;  Surgeon: Dorna Leitz, MD;  Location: [facility];  Service: Orthopedics;  Laterality: Right;   TRIGGER FINGER RELEASE Right 03/23/2013   Procedure: RELEASE TRIGGER FINGER/A-1 PULLEY RIGHT RING FINGER;  Surgeon: [name], MD;  Location: [facility];  Service: Orthopedics;  Laterality: Right;   TUBAL LIGATION     UPPER GI ENDOSCOPY      Social History   Socioeconomic History   Marital status: Divorced    Spouse name: Not on file   Number of children: 1   Years of education: Not on file   Highest education level: Not on file  Occupational History    Employer: [employer]  Tobacco Use   Smoking status: Never   Smokeless tobacco: Never  Vaping Use   Vaping Use: Never used  Substance and Sexual Activity   Alcohol use: No     Alcohol/week: 0.0 standard drinks of alcohol   Drug use: No   Sexual activity: Yes  Other Topics Concern   Not on file  Social History Narrative   Lives alone in a one story home.  Has one child.     Currently not working.     Worked for [employer].     Social Determinants of Health   Financial Resource Strain: Low Risk  (12/11/2020)   Overall Financial Resource Strain (CARDIA)    Difficulty of Paying Living Expenses: Not hard at all  Food Insecurity: No Food Insecurity (12/11/2020)   Hunger Vital Sign    Worried About Running Out of Food in the Last Year: Never true    [place] in the Last Year: Never true  Transportation Needs: No Transportation Needs (12/11/2020)   PRAPARE - Hydrologist (Medical): No    Lack of Transportation (Non-Medical): No  Physical Activity: Inactive (12/11/2020)   Exercise Vital Sign    Days of Exercise per Week: 0 days    Minutes of Exercise per Session: 0 min  Stress: No Stress Concern Present (12/11/2020)   [place]    Feeling of Stress : Not at all  Social Connections: Not on file  Intimate Partner Violence: Not At Risk (12/11/2020)   Humiliation, Afraid, Rape, and Kick questionnaire    Fear of Current or Ex-Partner: No    Emotionally Abused: No    Physically Abused: No    Sexually Abused: No    Family History  Problem Relation Age of Onset   Diabetes Sister    Anxiety disorder Sister    Heart disease Brother    Ovarian cancer Cousin    Breast cancer Maternal Grandmother        grandmother   Diabetes Maternal Aunt        aunt   Alcohol abuse Maternal Uncle        uncle   Diabetes Paternal Aunt    Alcohol abuse Paternal Uncle     Current Outpatient Medications on File Prior to Visit  Medication Sig Dispense Refill   ACCU-CHEK AVIVA PLUS test strip USE TO CHECK BLOOD SUGAR 3  TIMES DAILY 300 strip 3   Accu-Chek Softclix Lancets lancets  USE TO CHECK BLOOD SUGAR  THREE TIMES A DAY. 300 each 3   alendronate (FOSAMAX) 70 MG tablet TAKE 1 TABLET BY MOUTH WEEKLY  WITH 8 OZ OF PLAIN WATER 30  MINUTES BEFORE FIRST FOOD, DRINK OR MEDS. STAY UPRIGHT FOR 30  MINS 12 tablet 3   ALPRAZolam (XANAX) 0.5 MG tablet Take 0.5 mg by mouth 3 (three) times daily as needed for anxiety.      amphetamine-dextroamphetamine (ADDERALL) 7.5 MG tablet Take 7.5 mg  by mouth 2 (two) times daily. Morning and after lunch     atorvastatin (LIPITOR) 40 MG tablet TAKE 1 TABLET BY MOUTH  DAILY 90 tablet 3   Blood Glucose Calibration (ACCU-CHEK GUIDE CONTROL) LIQD 1 each by In Vitro route daily as needed. 1 each 3   Blood Glucose Monitoring Suppl (ACCU-CHEK GUIDE ME) w/Device KIT 1 each by Does not apply route in the morning, at noon, and at bedtime. 1 kit 0   buPROPion (WELLBUTRIN XL) 150 MG 24 hr tablet TAKE 1 TABLET BY MOUTH  DAILY 90 tablet 2   CALCIUM-VITAMIN D PO Take 600 mg by mouth 2 (two) times a day.      cyclobenzaprine (FLEXERIL) 10 MG tablet Take 0.5-1 tablets (5-10 mg total) by mouth at bedtime as needed for muscle spasms. 15 tablet 0   diclofenac Sodium (VOLTAREN) 1 % GEL Apply 2 g topically 4 (four) times daily as needed.     DULoxetine (CYMBALTA) 60 MG capsule TAKE 2 CAPSULES BY MOUTH  DAILY 180 capsule 2   fexofenadine (ALLEGRA ALLERGY) 180 MG tablet Take 1 tablet (180 mg total) by mouth daily.     gabapentin (NEURONTIN) 600 MG tablet TAKE ONE-HALF TABLET BY  MOUTH IN THE MORNING AND  AFTER LUNCH THEN TAKE 2  TABLETS AT BEDTIME 270 tablet 3   meloxicam (MOBIC) 15 MG tablet TAKE 1 TABLET BY MOUTH  DAILY 90 tablet 3   Multiple Vitamin (MULTIVITAMIN) tablet Take 1 tablet by mouth daily.     Omega-3 Fatty Acids (FISH OIL) 1200 MG CAPS Take 1,200 mg by mouth daily.      pantoprazole (PROTONIX) 40 MG tablet TAKE 1 TABLET BY MOUTH  DAILY 90 tablet 3   No current facility-administered medications on file prior to visit.    Allergies  Allergen Reactions    Hydrochlorothiazide Rash   Sulfonamide Derivatives Rash       Physical Exam There were no vitals filed for this visit. Estimated body mass index is 29.44 kg/m as calculated from the following:   Height as of this encounter: _0  (1.549 m).   Weight as of this encounter: 155 lb 12.8 oz (70.7 kg).  EKG (optional): deferred due to virtual visit  GENERAL:no audible sounds of distress, full vision exam deferred due to pandemic and/or virtual encounter   LUNGS: no audible gross SOB, gasping or wheezing  PSYCH/NEURO: pleasant and cooperative, no obvious depression or anxiety, speech and thought processing grossly intact, Cognitive function grossly intact  [place] Office Visit from 04/24/2022 in [facility]  PHQ-9 Total Score 8           04/24/2022    9:10 AM 10/04/2021    3:57 PM 12/11/2020    1:27 PM 09/16/2019    4:39 PM 06/22/2018    5:04 PM  Depression screen PHQ 2/9  Decreased Interest _1 Down, Depressed, Hopeless _2 PHQ - 2 Score _3 Altered sleeping _4 Tired, decreased energy 0 2 0 1 1  Change in appetite 0 _5 Feeling bad or failure about yourself  0 2 0 1 1  Trouble concentrating _6 Moving slowly or fidgety/restless 1 1 0 1 0  Suicidal thoughts 0 0 0 0 0  PHQ-9 Score _7 Difficult doing work/chores Somewhat difficult Somewhat difficult Somewhat  difficult Somewhat difficult Somewhat difficult      09/16/2019    3:40 PM 12/11/2020    1:26 PM 02/20/2022   10:20 PM 03/10/2022    7:18 PM 04/24/2022    9:13 AM  Fall Risk  Falls in the past year? 0 0 0 0 0  Was there an injury with Fall?  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) ([place])  Depression, recurrent ([place])  Overweight (BMI 25.0-29.9)    The following health maintenance/preventive care measures were recommended/discussed and the patient was referred if needed and if the patient agreeable:   Vaccines: in the fall - covid and flu boosters   Mammogram: UTD   Screening Pap smear/pelvic exam: see PCP  Lung Cancer Screening: n/a  Colorectal cancer screening: UTD  Osteoporosis screening if applicable: last dexa 03/6282  Screening for glaucoma: see optho  Statin use for primary prevention: on statin  Cardiovascular screening blood tests: seeing pcp in a few weeks  Diabetes screening tests: seeing pcp in a few weeks  Hepatitis B screening if applicable:n/c  HIV screening: declined  Hepatitis C screening: hep C  Chlamydia/Gonorrhea screening  STI Screening: declined  Syphilis Screening if applicable  Latent TB screening if applicable: n/a  Breast Cancer prevention medication if applicable: N/a  BRCA related risk assessment and referral for counseling if applicable: n/a  Education and counseling on the following was provided based on the above review of health and a plan/checklist for the patient, along with additional information discussed, was provided for the patient in the patient instructions :  -Advised on importance of and resources for completing advanced directives and provided information in [place] - she reports she plans to complete -Provided counseling and plan for difficulty hearing discussed/referral to audiology or ENT if applicable per above screening.She plans to see PCP in a few weeks as feels may be wax which she has dealt with in the past. Provided information for ENT in case this does not resolve.  -Provided counseling and plan for increased risk of falling if  applicable per above screening. (Referral for PT, community based exercise programs, etc.) -Provided counseling and plan for function difficulties/ difficulties with ADLs if applicable per above screening. -Advised and counseled on maintaining healthy weight and healthy lifestyle - including the importance of a health diet, regular physical activity, social connections and stress management. -Advised and counseled on a whole foods based healthy diet and regular exercise: discussed a heart healthy whole foods based diet at length. A summary of a healthy diet was provided in the Patient Instructions.Offered referral to dietician/weight management clinic if applicable and follow up virtual visits to assist further and monitor progress. Recommended regular exercise and discussed options within the community.  -Advised yearly dental visits at minimum and regular eye exams -Advised and counseled on alcohol, tobacco, drug, opoid use/misuse if applicable and options for help if applicable.  Follow up: see patient instructions     Patient Instructions  Follow up with Dr.  Bedsole in August as scheduled.  CHECKLIST FOR A HEALTHY LIFE:  -Eat a healthy whole foods based diet, get regular physical activity, manage stress and engage in social connections - see below for specific suggestions and more information.  -Vaccines due:  -flu and covid booster  -Tests and screenings due:  -labs with primary care doctor at visit in august  -I sent message to nursing staff to update your diabetic foot exam in Epic  -Referrals:  -If ringing in ears persists after seeing Dr. Diona Browner - please see Ear, Nose and Throat specialist:   -See a dentist at least yearly  -Get your eyes checked per your eye specialist's  recommendations    -----------------------------------------------------------------------------------------------------------------------------------------------------------------------------------------------------------------------------------------------------------   FOOD - THE FUEL FOR A HAPPY HEALTHY LIFE: Food Prescription for you: -eat real food: lots of colorful vegetables (half the plate), small amounts of fresh fruits, fish, nuts, seeds, healthy oils (such as olive oil, avocado oil , small portions of meat and small portions of whole grains -drink water -try to avoid fast food and pre-packaged foods  -try to avoid foods that contain any ingredients with names you do not recognize  -try to avoid sugar/sweets (except for the natural sugar that occurs in fresh fruit) -try to avoid sweet drinks -try to avoid white rice, white bread, pasta, white or yellow potatoes  MOVE - the key to keeping your body moving and working best: Exercise Prescription for you: -gradually increase intentional physical activity -move and stretch your body, legs, feet and arms when sitting for long periods -try to get at least 20 minutes of sustained activity or two 10 minute episodes of sustained activity every day at minimum  [place] - so important for health and well being -try meditating, or just sitting quietly with deep breathing while intentionally relaxing all parts of your body for 5 minutes daily  SOCIAL CONNECTIONS: -options in [place] if you wish to engage in more social activities: -Check out the [place] 50+ section on the [employer] (hiking clubs, book clubs, cards and games, chess, exercise classes, aquatic classes and much more) - see the website for details: https://www.[place].gov/departments/parks-recreation/active-adults50 -[place] (a variety of indoor and outdoor inperson activities for adults). (873)[phone]. [address]. -Virtual Online Classes (a variety of topics): see seniorplanet.org or call [phone] -consider volunteering at a school, hospice center, church, senior center or elsewhere   ADVANCED HEALTHCARE DIRECTIVES:  Everyone should have advanced health care directives in place. This is so that you get the care you want, should you ever be in a situation where you are unable to make your own medical decisions.   From the [place] Advanced Directive Website: "[place] are legal documents in which you give written instructions about your health care if, in the future, you cannot speak for yourself.   A health care power of attorney allows you to name a person you trust to make your health care decisions if you cannot make them yourself. A declaration of a desire for a natural death (or living will) is document, which states that you desire not to have your life prolonged by extraordinary measures if you have a terminal or incurable illness or if you are in a vegetative state. An advance instruction for mental health treatment makes a declaration of instructions, information and preferences regarding your mental health treatment. It also states that you are aware that the advance instruction authorizes a mental health treatment provider to act  according to your wishes. It may also outline your consent or refusal of mental health treatment. A declaration of an anatomical gift allows anyone over the age of 31 to make a gift by will, organ donor card or other document."   Please see the following website or an elder law attorney for forms, FAQs and for completion of advanced directives: [place] Secretary of [place] ([email or web address])  Or copy and paste the following to your web browser: [email or web address]           Lucretia Kern, DO

## 2022-04-24 NOTE — Patient Instructions (Signed)
Follow up with Dr. Diona Browner in August as scheduled.  CHECKLIST FOR A HEALTHY LIFE:  -Eat a healthy whole foods based diet, get regular physical activity, manage stress and engage in social connections - see below for specific suggestions and more information.  -Vaccines due:  -flu and covid booster  -Tests and screenings due:  -labs with primary care doctor at visit in august  -I sent message to nursing staff to update your diabetic foot exam in Epic  -Referrals:  -If ringing in ears persists after seeing Dr. Diona Browner - please see Ear, Nose and Throat specialist:   -See a dentist at least yearly  -Get your eyes checked per your eye specialist's recommendations       FOOD - THE FUEL FOR A HAPPY HEALTHY LIFE: Food Prescription for you: -eat real food: lots of colorful vegetables (half the plate), small amounts of fresh fruits, fish, nuts, seeds, healthy oils (such as olive oil, avocado oil , small portions of meat and small portions of whole grains -drink water -try to avoid fast food and pre-packaged foods  -try to avoid foods that contain any ingredients with names you do not recognize  -try to avoid sugar/sweets (except for the natural sugar that occurs in fresh fruit) -try to avoid sweet drinks -try to avoid white rice, white bread, pasta, white or yellow potatoes  MOVE - the key to keeping your body moving and working best: Exercise Prescription for you: -gradually increase intentional physical activity -move and stretch your body, legs, feet and arms when sitting for long periods -try to get at least 20 minutes of sustained activity or two 10 minute episodes of sustained activity every day at minimum  [place] - so  important for health and well being -try meditating, or just sitting quietly with deep breathing while intentionally relaxing all parts of your body for 5 minutes daily  SOCIAL CONNECTIONS: -options in [place] if you wish to engage in more social activities: -Check out the [place] 50+ section on the [place] [employer] (hiking clubs, book clubs, cards and games, chess, exercise classes, aquatic classes and much more) - see the website for details: https://www.[place].gov/departments/parks-recreation/active-adults50 -[place] (a variety of indoor and outdoor inperson activities for adults). [phone]. [address]. -Virtual Online Classes (a variety of topics): see seniorplanet.org or call 603-252-7131 -consider volunteering at a school, hospice center, church, senior center or elsewhere   ADVANCED HEALTHCARE DIRECTIVES:  Everyone should have advanced health care directives in place. This is so that you get the care you want, should you ever be in a situation where you are unable to make your own medical decisions.   From the [place] Advanced Directive Website: "[place] are legal documents in which you give written instructions about your health care if, in the future, you cannot speak for yourself.   A health care power of attorney allows you to name a person you trust to make your health care decisions if you cannot make them yourself. A declaration of a desire for a natural death (or living will) is document, which states that you desire not to have your life prolonged by extraordinary measures if you have a terminal or incurable illness or if you are in a vegetative state. An advance instruction for mental health treatment makes a declaration of instructions, information and preferences regarding your mental health treatment. It also states that you are aware that the advance instruction authorizes a mental health  treatment provider to act according to your wishes. It may also outline your consent or refusal of mental health treatment. A declaration of an anatomical gift allows anyone over the age of 85 to make a gift by will, organ donor card or other document."   Please see the following website or an elder law attorney for forms, FAQs and for completion of advanced directives: [place] [employer] of [place] ([email or web address].no)  Or copy and paste the following to your web browser: [email or web address]

## 2022-04-30 ENCOUNTER — Ambulatory Visit (INDEPENDENT_AMBULATORY_CARE_PROVIDER_SITE_OTHER): Payer: Medicare Other | Admitting: Family Medicine

## 2022-04-30 VITALS — BP 132/80 | HR 85 | Temp 96.9°F | Ht 61.0 in | Wt 155.0 lb

## 2022-04-30 DIAGNOSIS — J029 Acute pharyngitis, unspecified: Secondary | ICD-10-CM | POA: Diagnosis not present

## 2022-04-30 LAB — POCT RAPID STREP A (OFFICE): Rapid Strep A Screen: NEGATIVE

## 2022-04-30 NOTE — Patient Instructions (Signed)
Based on your symptoms, it looks like you have a virus.   Antibiotics are not need for a viral infection but the following will help:   Drink plenty of fluids Get lots of rest  Sinus Congestion 1) Neti Pot (Saline rinse) -- 2 times day -- if tolerated 2) Flonase (Store Brand ok) - once daily 3) Over the counter congestion medications  Cough 1) Cough drops can be helpful 2) Nyquil (or nighttime cough medication) 3) Cough medicine with Dextromethorphan can also be helpful  Sore Throat 1) Cough drops 2) Ibuprofen or Aleve can be helpful 3) Salt water Gargles  If you develop fevers (Temperature >100.4), chills, worsening symptoms or symptoms lasting longer than 10 days return to clinic.

## 2022-04-30 NOTE — Progress Notes (Signed)
Subjective:     Lisa Odom is a 66 y.o. female presenting for Sore Throat (X 5 days. )     Sore Throat  This is a new problem. The current episode started in the past 7 days. There has been no fever. Associated symptoms include congestion, coughing, ear pain and headaches. Pertinent negatives include no diarrhea, ear discharge, shortness of breath, swollen glands, trouble swallowing or vomiting. She has had no exposure to strep or mono. Exposure to: had strep in June. She has tried acetaminophen for the symptoms. The treatment provided mild relief.   Takes allegra and nasal sprays w/o help with allergies  Review of Systems  HENT:  Positive for congestion and ear pain. Negative for ear discharge and trouble swallowing.   Respiratory:  Positive for cough. Negative for shortness of breath.   Gastrointestinal:  Negative for diarrhea and vomiting.  Neurological:  Positive for headaches.     Social History   Tobacco Use  Smoking Status Never  Smokeless Tobacco Never        Objective:    BP Readings from Last 3 Encounters:  04/30/22 132/80  03/14/22 (!) 142/90  01/14/22 (!) 152/84   Wt Readings from Last 3 Encounters:  04/30/22 155 lb (70.3 kg)  04/24/22 155 lb 12.8 oz (70.7 kg)  03/14/22 155 lb 12.8 oz (70.7 kg)    BP 132/80   Pulse 85   Temp (!) 96.9 F (36.1 C) (Temporal)   Ht '5\' 1"'$  (1.549 m)   Wt 155 lb (70.3 kg)   SpO2 97%   BMI 29.29 kg/m    Physical Exam Constitutional:      General: She is not in acute distress.    Appearance: She is well-developed. She is not diaphoretic.  HENT:     Head: Normocephalic and atraumatic.     Right Ear: Tympanic membrane and ear canal normal.     Left Ear: Tympanic membrane and ear canal normal.     Nose: Mucosal edema and rhinorrhea present.     Right Sinus: No maxillary sinus tenderness or frontal sinus tenderness.     Left Sinus: No maxillary sinus tenderness or frontal sinus tenderness.     Mouth/Throat:      Pharynx: Uvula midline. Posterior oropharyngeal erythema present. No oropharyngeal exudate.     Tonsils: 0 on the right. 0 on the left.  Eyes:     General: No scleral icterus.    Conjunctiva/sclera: Conjunctivae normal.  Cardiovascular:     Rate and Rhythm: Normal rate and regular rhythm.     Heart sounds: Normal heart sounds. No murmur heard. Pulmonary:     Effort: Pulmonary effort is normal. No respiratory distress.     Breath sounds: Normal breath sounds.  Musculoskeletal:     Cervical back: Neck supple.  Lymphadenopathy:     Cervical: No cervical adenopathy.  Skin:    General: Skin is warm and dry.     Capillary Refill: Capillary refill takes less than 2 seconds.  Neurological:     Mental Status: She is alert.     Strep negative      Assessment & Plan:   Problem List Items Addressed This Visit   None Visit Diagnoses     Sore throat    -  Primary   Relevant Orders   POCT rapid strep A      Strep testing is negative.  Suspect viral illness versus allergies. She will switch to an alternative allergy medication  when she completes her bottle of Allegra.  Kidney function is normal okay for occasional Advil for sore throat.  Discussed call back including fever, shortness of breath, or persistent sinus pressure.  Return if symptoms worsen or fail to improve.  Lesleigh Noe, MD

## 2022-05-07 ENCOUNTER — Telehealth: Payer: Self-pay | Admitting: Family Medicine

## 2022-05-07 DIAGNOSIS — E1140 Type 2 diabetes mellitus with diabetic neuropathy, unspecified: Secondary | ICD-10-CM

## 2022-05-07 NOTE — Telephone Encounter (Signed)
-----   Message from Ellamae Sia sent at 04/25/2022  3:59 PM EDT ----- Regarding: Lab orders for Friday, 8.11.23 Patient is scheduled for CPX labs, please order future labs, Thanks , Karna Christmas

## 2022-05-09 ENCOUNTER — Other Ambulatory Visit (INDEPENDENT_AMBULATORY_CARE_PROVIDER_SITE_OTHER): Payer: Medicare Other

## 2022-05-09 DIAGNOSIS — E1140 Type 2 diabetes mellitus with diabetic neuropathy, unspecified: Secondary | ICD-10-CM | POA: Diagnosis not present

## 2022-05-09 LAB — LIPID PANEL
Cholesterol: 122 mg/dL (ref 0–200)
HDL: 47.7 mg/dL (ref >39.00)
LDL Cholesterol: 55 mg/dL (ref 0–99)
NonHDL: 74.2
Total CHOL/HDL Ratio: 3
Triglycerides: 94 mg/dL (ref 0.0–149.0)
VLDL: 18.8 mg/dL (ref 0.0–40.0)

## 2022-05-09 LAB — HEMOGLOBIN A1C: Hgb A1c MFr Bld: 7.1 % — ABNORMAL HIGH (ref 4.6–6.5)

## 2022-05-09 LAB — COMPREHENSIVE METABOLIC PANEL
ALT: 22 U/L (ref 0–35)
AST: 21 U/L (ref 0–37)
Albumin: 4.1 g/dL (ref 3.5–5.2)
Alkaline Phosphatase: 72 U/L (ref 39–117)
BUN: 13 mg/dL (ref 6–23)
CO2: 29 mEq/L (ref 19–32)
Calcium: 9.3 mg/dL (ref 8.4–10.5)
Chloride: 102 mEq/L (ref 96–112)
Creatinine, Ser: 0.75 mg/dL (ref 0.40–1.20)
GFR: 81.79 mL/min (ref >60.00)
Glucose, Bld: 185 mg/dL — ABNORMAL HIGH (ref 70–99)
Potassium: 3.8 mEq/L (ref 3.5–5.1)
Sodium: 140 mEq/L (ref 135–145)
Total Bilirubin: 0.5 mg/dL (ref 0.2–1.2)
Total Protein: 6.9 g/dL (ref 6.0–8.3)

## 2022-05-09 NOTE — Progress Notes (Signed)
No critical labs need to be addressed urgently. We will discuss labs in detail at upcoming office visit.   

## 2022-05-16 ENCOUNTER — Ambulatory Visit (INDEPENDENT_AMBULATORY_CARE_PROVIDER_SITE_OTHER): Payer: Medicare Other | Admitting: Family Medicine

## 2022-05-16 VITALS — BP 140/80 | HR 90 | Temp 96.5°F | Ht 61.0 in | Wt 155.0 lb

## 2022-05-16 DIAGNOSIS — F332 Major depressive disorder, recurrent severe without psychotic features: Secondary | ICD-10-CM

## 2022-05-16 DIAGNOSIS — M810 Age-related osteoporosis without current pathological fracture: Secondary | ICD-10-CM | POA: Diagnosis not present

## 2022-05-16 DIAGNOSIS — I152 Hypertension secondary to endocrine disorders: Secondary | ICD-10-CM

## 2022-05-16 DIAGNOSIS — E1159 Type 2 diabetes mellitus with other circulatory complications: Secondary | ICD-10-CM

## 2022-05-16 DIAGNOSIS — E785 Hyperlipidemia, unspecified: Secondary | ICD-10-CM | POA: Diagnosis not present

## 2022-05-16 DIAGNOSIS — E1169 Type 2 diabetes mellitus with other specified complication: Secondary | ICD-10-CM

## 2022-05-16 DIAGNOSIS — Z0000 Encounter for general adult medical examination without abnormal findings: Secondary | ICD-10-CM | POA: Diagnosis not present

## 2022-05-16 MED ORDER — TRAZODONE HCL 50 MG PO TABS: 25.0000 mg | ORAL_TABLET | Freq: Every evening | ORAL | 0 refills | Status: DC | PRN

## 2022-05-16 NOTE — Assessment & Plan Note (Signed)
Improved on Fosamax.  Plan stop in 2024-2025

## 2022-05-16 NOTE — Assessment & Plan Note (Signed)
chronic, recent worsening control May be worsened due to Adderall.  She will watch blood pressure closely.  Goal less than 140/90

## 2022-05-16 NOTE — Patient Instructions (Addendum)
Start a trial of low dose trazodone for sleep. Do not take muscle relaxant.  Okay to run it by Dr. , Toy Care as well prior to using trazodone.  Call if not helping or if side effect.  Follow blood pressure at home on Adderall... goal BP < 140/90.  Decrease sweet fruits and work low carb diet.  Increase exercise, walking or gym 3-5 days a week

## 2022-05-16 NOTE — Progress Notes (Signed)
Patient ID: Lisa Odom, female    DOB: [birth year], 66 y.o.   MRN: [id number]  This visit was conducted in person.  BP (!) 140/80   Pulse 90   Temp (!) 96.5 F (35.8 C) (Temporal)   Ht _0  (1.549 m)   Wt 155 lb (70.3 kg)   SpO2 97%   BMI 29.29 kg/m    CC:  Chief Complaint  Patient presents with   Medicare Wellness    Subjective:   HPI: Lisa Odom is a 66 y.o. female presenting on 05/16/2022 for Medicare Wellness  The patient presents for annual medicare wellness, complete physical and review of chronic health problems. He/She also has the following acute concerns today: none   I have personally reviewed the Medicare Annual Wellness questionnaire and have noted 1. The patient's medical and social history 2. Their use of alcohol, tobacco or illicit drugs 3. Their current medications and supplements 4. The patient's functional ability including ADL's, fall risks, home safety risks and hearing or visual             impairment. 5. Diet and physical activities 6. Evidence for depression or mood disorders 7.         Updated provider list Cognitive evaluation was performed and recorded on pt medicare questionnaire form. The patients weight, height, BMI and visual acuity have been recorded in the chart   I have made referrals, counseling and provided education to the patient based review of the above and I have provided the pt with a written personalized care plan for preventive services.    Documentation of this information was scanned into the electronic record under the media tab.  Hearing Screening   _1  _2  _3  _4  _5   Right ear _6 Left ear _7 Vision Screening   Right eye Left eye Both eyes  Without correction     With correction _8   No falls in last 12 months.   Advance directives and end of life planning reviewed in detail with patient and documented in EMR. Patient given handout on  advance care directives if needed. HCPOA and living will updated if needed.  [place] Office Visit from 04/24/2022 in [place] at [employer] Total Score 2      Diabetes:  Diet controlled, slightly worse this time... has been eating a lot of  watermelon Lab Results  Component Value Date   HGBA1C 7.1 (H) 05/09/2022  Using medications without difficulties: Hypoglycemic episodes: Hyperglycemic episodes: Feet problems: no ulcers Blood Sugars averaging: eye exam within last year: yes  Elevated Cholesterol:well controlled on atorvastatin 40 mg daily Lab Results  Component Value Date   CHOL 122 05/09/2022   HDL 47.70 05/09/2022   LDLCALC 55 05/09/2022   TRIG 94.0 05/09/2022   CHOLHDL 3 05/09/2022  Using medications without problems: none Muscle aches:  none Diet compliance: Exercise: Other complaints:  Hypertension:   Not on a medication.. she is using Adderall during the day for  focus.  BP Readings from Last 3 Encounters:  05/16/22 (!) 140/80  04/30/22 132/80  03/14/22 (!) 142/90  Using medication without problems or lightheadedness:  none Chest pain with exertion: none Edema:none Short of breath:none Average home BPs: 130/80 Other issues:  Chronic insomnia  Has not been able to sleep.. minimal sleep at night. Tried melatonin.Marland Kitchen thinks trazodone helped in past.    Some issue with restless leg.  Moderate control depression on wellbutrin XL  150 mg and cymbalta 60 mg daily  Relevant past medical, surgical, family and social history reviewed and updated as indicated. Interim medical history since our last visit reviewed. Allergies and medications reviewed and updated. Outpatient Medications Prior to Visit  Medication Sig Dispense Refill   ACCU-CHEK AVIVA PLUS test strip USE TO CHECK BLOOD SUGAR 3  TIMES DAILY 300 strip 3   Accu-Chek Softclix Lancets lancets USE TO CHECK BLOOD SUGAR  THREE TIMES A DAY. 300 each 3   alendronate (FOSAMAX) 70 MG  tablet TAKE 1 TABLET BY MOUTH WEEKLY  WITH 8 OZ OF PLAIN WATER 30  MINUTES BEFORE FIRST FOOD, DRINK OR MEDS. STAY UPRIGHT FOR 30  MINS 12 tablet 3   ALPRAZolam (XANAX) 0.5 MG tablet Take 0.5 mg by mouth 3 (three) times daily as needed for anxiety.      amphetamine-dextroamphetamine (ADDERALL) 7.5 MG tablet Take 7.5 mg by mouth 2 (two) times daily. Morning and after lunch     atorvastatin (LIPITOR) 40 MG tablet TAKE 1 TABLET BY MOUTH  DAILY 90 tablet 3   Blood Glucose Calibration (ACCU-CHEK GUIDE CONTROL) LIQD 1 each by In Vitro route daily as needed. 1 each 3   Blood Glucose Monitoring Suppl (ACCU-CHEK GUIDE ME) w/Device KIT 1 each by Does not apply route in the morning, at noon, and at bedtime. 1 kit 0   buPROPion (WELLBUTRIN XL) 150 MG 24 hr tablet TAKE 1 TABLET BY MOUTH  DAILY 90 tablet 2   CALCIUM-VITAMIN D PO Take 600 mg by mouth 2 (two) times a day.      cyclobenzaprine (FLEXERIL) 10 MG tablet Take 0.5-1 tablets (5-10 mg total) by mouth at bedtime as needed for muscle spasms. 15 tablet 0   diclofenac Sodium (VOLTAREN) 1 % GEL Apply 2 g topically 4 (four) times daily as needed.     DULoxetine (CYMBALTA) 60 MG capsule TAKE 2 CAPSULES BY MOUTH  DAILY 180 capsule 2   fexofenadine (ALLEGRA ALLERGY) 180 MG tablet Take 1 tablet (180 mg total) by mouth daily.     gabapentin (NEURONTIN) 600 MG tablet TAKE ONE-HALF TABLET BY  MOUTH IN THE MORNING AND  AFTER LUNCH THEN TAKE 2  TABLETS AT BEDTIME 270 tablet 3   meloxicam (MOBIC) 15 MG tablet TAKE 1 TABLET BY MOUTH  DAILY 90 tablet 3   Multiple Vitamin (MULTIVITAMIN) tablet Take 1 tablet by mouth daily.     Omega-3 Fatty Acids (FISH OIL) 1200 MG CAPS Take 1,200 mg by mouth daily.      pantoprazole (PROTONIX) 40 MG tablet TAKE 1 TABLET BY MOUTH  DAILY 90 tablet 3   No facility-administered medications prior to visit.     Per HPI unless specifically indicated in ROS section below Review of Systems Objective:  BP (!) 140/80   Pulse 90   Temp (!) 96.5  F (35.8 C) (Temporal)   Ht _0  (1.549 m)   Wt 155 lb (70.3 kg)   SpO2 97%   BMI 29.29 kg/m   Wt Readings from Last 3 Encounters:  05/16/22 155 lb (70.3 kg)  04/30/22 155 lb (70.3 kg)  04/24/22 155 lb 12.8 oz (70.7 kg)      Physical Exam    Results for orders placed or performed in visit on 05/09/22  Comprehensive metabolic panel  Result Value Ref Range   Sodium 140 135 - 145 mEq/L   Potassium 3.8 3.5 - 5.1 mEq/L   Chloride 102  96 - 112 mEq/L   CO2 29 19 - 32 mEq/L   Glucose, Bld 185 (H) 70 - 99 mg/dL   BUN 13 6 - 23 mg/dL   Creatinine, Ser 0.75 0.40 - 1.20 mg/dL   Total Bilirubin 0.5 0.2 - 1.2 mg/dL   Alkaline Phosphatase 72 39 - 117 U/L   AST 21 0 - 37 U/L   ALT 22 0 - 35 U/L   Total Protein 6.9 6.0 - 8.3 g/dL   Albumin 4.1 3.5 - 5.2 g/dL   GFR 81.79 >60.00 mL/min   Calcium 9.3 8.4 - 10.5 mg/dL  Lipid panel  Result Value Ref Range   Cholesterol 122 0 - 200 mg/dL   Triglycerides 94.0 0.0 - 149.0 mg/dL   HDL 47.70 >39.00 mg/dL   VLDL 18.8 0.0 - 40.0 mg/dL   LDL Cholesterol 55 0 - 99 mg/dL   Total CHOL/HDL Ratio 3    NonHDL 74.20   Hemoglobin A1c  Result Value Ref Range   Hgb A1c MFr Bld 7.1 (H) 4.6 - 6.5 %     COVID 19 screen:  No recent travel or known exposure to COVID19 The patient denies respiratory symptoms of COVID 19 at this time. The importance of social distancing was discussed today.   Assessment and Plan   The patient's preventative maintenance and recommended screening tests for an annual wellness exam were reviewed in full today. Brought up to date unless services declined.  Counselled on the importance of diet, exercise, and its role in overall health and mortality. The patient's FH and SH was reviewed, including their home life, tobacco status, and drug and alcohol status.   Vaccine:  Due for  td,  uptodate PCV23, shingrix.  PAP 2016 nml pap, neg HPV. Assymptomatic Mammogram every 1-2 years. Done 03/2022 COLON:  2019 Dr. Silverio Decamp repeat  in 10 years.  Hep C done  DEXA 12/2020  improved from osteoporosis to osteopenia on fosamax  Problem List Items Addressed This Visit     Hyperlipidemia associated with type 2 diabetes mellitus ([place]) (Chronic)    Stable, chronic.  Continue current medication.   Atorvastatin 40 mg p.o. daily      Hypertension associated with diabetes ([place]) (Chronic)    chronic, recent worsening control May be worsened due to Adderall.  She will watch blood pressure closely.  Goal less than 140/90      Major depressive disorder, recurrent episode, severe (HCC) (Chronic)    Chronic, severe poor control  Followed by Dr. Toy Care.       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  Previously she was diet controlled but has been eating a lot of sweet fruit over the summer.  She will get back on track with both lifestyle changes and exercise.  If her A1c is not less than 7 at next office visit in 3 months we will initiate a medication such as metformin.       Osteoporosis    Improved on Fosamax.  Plan stop in 2024-2025      Other Visit Diagnoses     Medicare annual wellness visit, subsequent    -  Primary       [name], MD

## 2022-05-16 NOTE — Assessment & Plan Note (Signed)
Chronic, severe poor control  Followed by Dr. Toy Care.

## 2022-05-16 NOTE — Assessment & Plan Note (Signed)
Stable, chronic.  Continue current medication.   Atorvastatin 40 mg p.o. daily 

## 2022-05-16 NOTE — Assessment & Plan Note (Signed)
chronic, inadequate control on no medication.  Previously she was diet controlled but has been eating a lot of sweet fruit over the summer.  She will get back on track with both lifestyle changes and exercise.  If her A1c is not less than 7 at next office visit in 3 months we will initiate a medication such as metformin.

## 2022-05-22 ENCOUNTER — Ambulatory Visit: Payer: Medicare Other | Admitting: Podiatry

## 2022-05-22 ENCOUNTER — Ambulatory Visit (INDEPENDENT_AMBULATORY_CARE_PROVIDER_SITE_OTHER): Payer: Medicare Other

## 2022-05-22 DIAGNOSIS — B351 Tinea unguium: Secondary | ICD-10-CM

## 2022-05-22 DIAGNOSIS — M19271 Secondary osteoarthritis, right ankle and foot: Secondary | ICD-10-CM

## 2022-05-22 DIAGNOSIS — M2011 Hallux valgus (acquired), right foot: Secondary | ICD-10-CM

## 2022-05-22 MED ORDER — TERBINAFINE HCL 250 MG PO TABS: 250.0000 mg | ORAL_TABLET | Freq: Every day | ORAL | 0 refills | Status: AC

## 2022-05-22 NOTE — Patient Instructions (Addendum)
Call [facility] to schedule your CT at the below locations.  Please allow at least 1 business day after your visit to process the referral.  It may take longer depending on approval from insurance.  Please let me know if you have issues or problems scheduling the MRI    [facility] [phone] [address], [place] [address]   The procedures we discussed: great toe (metatarsophalangeal joint) fusion OR Lapiplasty (you would need what is called an [place])

## 2022-05-23 ENCOUNTER — Other Ambulatory Visit: Payer: Self-pay | Admitting: *Deleted

## 2022-05-23 DIAGNOSIS — E1159 Type 2 diabetes mellitus with other circulatory complications: Secondary | ICD-10-CM

## 2022-05-23 MED ORDER — ACCU-CHEK GUIDE VI STRP: ORAL_STRIP | 3 refills | Status: DC

## 2022-05-25 NOTE — Progress Notes (Signed)
  Subjective:  Patient ID: Lisa Odom, female    DOB: 03/04/1956,  MRN: [id number]  Chief Complaint  Patient presents with   Bunions     consult appt for bunion, Dr Milinda Pointer patient    67 y.o. female presents with the above complaint. History confirmed with patient.  She had this for many years and she is unsure and surgical correction she has tried wider shoes without relief.  Beginning to get more painful.  The pain is all around the big toe joint, she does not have much pain in the middle of the foot  Objective:  Physical Exam: warm, good capillary refill, no trophic changes or ulcerative lesions, normal DP and PT pulses, and normal sensory exam.   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 Onychomycosis      Plan:  Patient was evaluated and treated and all questions answered.   We reviewed her radiographs.  We discussed surgical correction with Adductoplasty and Lapidus bunionectomy to correct her metatarsus adductus versus metatarsophalangeal joint fusion.  We discussed the pros and cons of each of these.  She does not have much pain in the midfoot.  I recommended CT to evaluate for the severity of the arthritis for surgical planning.  Her radiographs today did show arthritic changes in the dorsal metatarsal head and sesamoid complex   I reviewed the results of her  pathology results with her.  Rx for 90-day course of Lamisil sent to her pharmacy.  Previous LFTs were reviewed.  She has no other contraindications.  Use and possible side effects reviewed  Return for after CT scan to review.

## 2022-05-26 ENCOUNTER — Telehealth: Payer: Self-pay | Admitting: Podiatry

## 2022-05-26 NOTE — Telephone Encounter (Signed)
Pt called and is scheduled for her Ct scan on 8.30 and I have went ahead and scheduled her for the follow up on 9.6.2023 in [place] office.

## 2022-05-28 ENCOUNTER — Ambulatory Visit: Payer: Self-pay

## 2022-05-28 ENCOUNTER — Ambulatory Visit
Admission: RE | Admit: 2022-05-28 | Discharge: 2022-05-28 | Disposition: A | Discharge status: Home / Self Care | Payer: Medicare Other | Source: Ambulatory Visit | Attending: Podiatry | Admitting: Podiatry

## 2022-05-28 DIAGNOSIS — M79671 Pain in right foot: Secondary | ICD-10-CM | POA: Diagnosis not present

## 2022-05-28 DIAGNOSIS — M2011 Hallux valgus (acquired), right foot: Secondary | ICD-10-CM

## 2022-05-28 NOTE — Patient Outreach (Signed)
  Care Coordination   05/28/2022 Name: Lisa Odom MRN: [id number] DOB: Aug 18, 1956   Care Coordination Outreach Attempts:  An unsuccessful telephone outreach was attempted today to offer the patient information about available care coordination services as a benefit of their health plan.   Follow Up Plan:  Additional outreach attempts will be made to offer the patient care coordination information and services.   Encounter Outcome:  No Answer  Care Coordination Interventions Activated:  No   Care Coordination Interventions:  No, not indicated    Quinn Plowman RN,BSN,CCM RN Care Manager Coordinator [phone]

## 2022-05-30 ENCOUNTER — Ambulatory Visit: Payer: Self-pay

## 2022-05-30 NOTE — Patient Outreach (Signed)
  Care Coordination   05/30/2022 Name: Lisa Odom MRN: [id number] DOB: [birth year]   Care Coordination Outreach Attempts:  A second unsuccessful outreach was attempted today to offer the patient with information about available care coordination services as a benefit of their health plan.     Follow Up Plan:  Additional outreach attempts will be made to offer the patient care coordination information and services.   Encounter Outcome:  No Answer  Care Coordination Interventions Activated:  No   Care Coordination Interventions:  No, not indicated    Quinn Plowman RN,BSN,CCM RN Care Manager Coordinator [phone]

## 2022-06-04 ENCOUNTER — Ambulatory Visit (INDEPENDENT_AMBULATORY_CARE_PROVIDER_SITE_OTHER): Payer: Medicare Other | Admitting: Podiatry

## 2022-06-04 DIAGNOSIS — E559 Vitamin D deficiency, unspecified: Secondary | ICD-10-CM

## 2022-06-04 DIAGNOSIS — M21611 Bunion of right foot: Secondary | ICD-10-CM | POA: Diagnosis not present

## 2022-06-04 DIAGNOSIS — M2011 Hallux valgus (acquired), right foot: Secondary | ICD-10-CM

## 2022-06-04 DIAGNOSIS — M19271 Secondary osteoarthritis, right ankle and foot: Secondary | ICD-10-CM

## 2022-06-04 NOTE — Progress Notes (Signed)
Subjective:  Patient ID: Lisa Odom, female    DOB: 03-06-1956,  MRN: [id number]  Chief Complaint  Patient presents with   Bunions     ct results/had ct 8.30 aware in [place] location    66 y.o. female presents with the above complaint. History confirmed with patient.  Still very painful.  She returns for follow-up today, she completed the CT scan  Objective:  Physical Exam: warm, good capillary refill, no trophic changes or ulcerative lesions, normal DP and PT pulses, and normal sensory exam.   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 Osteoarthritis of the tarsometatarsal joints which appears worst at the second and third where it is also moderate to moderately severe.     Electronically Signed   By: Inge Rise M.D.   On: 05/29/2022 08:30   Assessment:   1. Hallux valgus with bunions, right   2. Other secondary osteoarthritis of right foot   3. Vitamin D deficiency      Plan:  Patient was evaluated and treated and all questions answered.  We again reviewed the results of her CT scan and previous x-rays together.  We again discussed further treatment for her hallux valgus deformity.  She does have significant osteoarthritis of the second and third tarsometatarsal joints.  So far this seems to be  relatively asymptomatic for her during ambulation as well as during physical exam today with me.  She does have a painful bunion deformity.  Her underlying metatarsus adductus deformity I think precludes the success of a distal osteotomy, and the metatarsal phalangeal joint arthritis precludes the success of a Lapidus bunionectomy.  I think the best treatment for her hallux valgus deformity that is arthritic is a first metatarsal phalangeal joint arthrodesis.  I do not think tarsometatarsal fusion of the second and third is necessary at this point due to the lack of symptoms at this location.  She may need this in the future we will address this at a later date if necessary or indicated at that time.  We discussed the risk benefits and potential complications of the procedure including but not limited to  pain, swelling, infection, scar, numbness which may be temporary or permanent, chronic pain, stiffness, nerve pain or damage, wound healing problems, bone healing problems including delayed or non-union.  All questions were addressed.  I ordered a vitamin D level for evaluation she does have a previous history of low bone density.  Informed consent was signed and reviewed.  Surgery will be scheduled mutually agreeable date   Surgical plan:  Procedure: -Right foot first MPJ fusion  Location: -[facility]  Anesthesia plan: -IV sedation with regional block  Postoperative pain plan: - Tylenol 1000 mg every 6 hours, ibuprofen 600 mg every 6 hours, gabapentin 300 mg every 8 hours x5 days, oxycodone 5 mg 1-2 tabs every 6 hours only as needed  DVT prophylaxis: -ASA 325  mg twice daily  WB Restrictions / DME needs: -WBAT in CAM boot this was dispensed today as well    Return for after surgery.

## 2022-06-05 LAB — VITAMIN D 25 HYDROXY (VIT D DEFICIENCY, FRACTURES): Vit D, 25-Hydroxy: 39 ng/mL (ref 30.0–100.0)

## 2022-06-06 ENCOUNTER — Other Ambulatory Visit: Payer: Self-pay | Admitting: Family Medicine

## 2022-06-10 ENCOUNTER — Other Ambulatory Visit: Payer: Self-pay | Admitting: Family Medicine

## 2022-06-10 ENCOUNTER — Telehealth: Payer: Self-pay

## 2022-06-10 DIAGNOSIS — E1140 Type 2 diabetes mellitus with diabetic neuropathy, unspecified: Secondary | ICD-10-CM

## 2022-06-10 NOTE — Telephone Encounter (Signed)
Jeanell called and stated she has just started taking vitamin D twice daily.

## 2022-06-16 ENCOUNTER — Other Ambulatory Visit: Payer: Self-pay | Admitting: Family Medicine

## 2022-07-04 DIAGNOSIS — H04222 Epiphora due to insufficient drainage, left lacrimal gland: Secondary | ICD-10-CM | POA: Diagnosis not present

## 2022-07-16 ENCOUNTER — Other Ambulatory Visit: Payer: Self-pay | Admitting: Family Medicine

## 2022-07-16 ENCOUNTER — Telehealth: Payer: Self-pay | Admitting: Podiatry

## 2022-07-16 NOTE — Telephone Encounter (Signed)
DOS: 08/15/2022  [employer]  Procedures: Hallux MPJ Fusion Rt [phone]) and Bone Graft Rt (20900) DX: M20.11 and M96.0  Deductible: $0 Out-of-Pocket: $3,900 w/ $3,541.49 remaining CoInsurance: 0% per [facility] Website Copay: $295  Notification or Prior Authorization is not required for the requested services  This [employer] Medicare Advantage members plan does not currently require a prior authorization for these services. If you have general questions about the prior authorization requirements, please call us at [phone] or visit [email or web address] > Clinician Resources > Advance and Admission Notification Requirements. The number above acknowledges your notification. Please write this number down for future reference. Notification is not a guarantee of coverage or payment.  Decision ID #:[id number]  The number above acknowledges your inquiry and our response. Please write this number down and refer to it for future inquiries. Coverage and payment for an item or service is governed by the member's benefit plan document, and, if applicable, the provider's participation agreement with the Health Plan.

## 2022-07-30 ENCOUNTER — Encounter: Payer: Self-pay | Admitting: Family Medicine

## 2022-08-03 ENCOUNTER — Other Ambulatory Visit: Payer: Self-pay | Admitting: *Deleted

## 2022-08-10 DIAGNOSIS — R030 Elevated blood-pressure reading, without diagnosis of hypertension: Secondary | ICD-10-CM | POA: Diagnosis not present

## 2022-08-10 DIAGNOSIS — J019 Acute sinusitis, unspecified: Secondary | ICD-10-CM | POA: Diagnosis not present

## 2022-08-11 ENCOUNTER — Telehealth: Payer: Self-pay

## 2022-08-11 NOTE — Telephone Encounter (Signed)
Lisa Odom called to rescheduled her surgery with Dr. Sherryle Lis on 08/15/2022. She has a sinus infection and just started antibiotics 08/10/2022. I have her rescheduled to 09/08/2022. Notified Dr. Sherryle Lis and Caren Griffins with [facility]

## 2022-08-15 ENCOUNTER — Other Ambulatory Visit: Payer: Self-pay | Admitting: Podiatry

## 2022-08-15 ENCOUNTER — Other Ambulatory Visit: Payer: Self-pay | Admitting: Family Medicine

## 2022-08-20 ENCOUNTER — Encounter: Payer: Medicare Other | Admitting: Podiatry

## 2022-08-26 ENCOUNTER — Ambulatory Visit (INDEPENDENT_AMBULATORY_CARE_PROVIDER_SITE_OTHER): Payer: Medicare Other | Admitting: Family Medicine

## 2022-08-26 ENCOUNTER — Encounter: Payer: Self-pay | Admitting: Family Medicine

## 2022-08-26 ENCOUNTER — Other Ambulatory Visit: Payer: Medicare Other

## 2022-08-26 VITALS — BP 138/70 | HR 81 | Temp 98.0°F | Ht 61.0 in | Wt 155.2 lb

## 2022-08-26 DIAGNOSIS — R195 Other fecal abnormalities: Secondary | ICD-10-CM | POA: Diagnosis not present

## 2022-08-26 DIAGNOSIS — I152 Hypertension secondary to endocrine disorders: Secondary | ICD-10-CM | POA: Diagnosis not present

## 2022-08-26 DIAGNOSIS — E1159 Type 2 diabetes mellitus with other circulatory complications: Secondary | ICD-10-CM | POA: Diagnosis not present

## 2022-08-26 LAB — POCT GLYCOSYLATED HEMOGLOBIN (HGB A1C): Hemoglobin A1C: 6.4 % — AB (ref 4.0–5.6)

## 2022-08-26 NOTE — Progress Notes (Signed)
Patient ID: Lisa Odom, female    DOB: 18-Aug-1956, 66 y.o.   MRN: [id number]  This visit was conducted in person.  BP 138/70   Pulse 81   Temp 98 F (36.7 C) (Oral)   Ht _0  (1.549 m)   Wt 155 lb 4 oz (70.4 kg)   SpO2 94%   BMI 29.33 kg/m    CC:  Chief Complaint  Patient presents with   Diabetes    Subjective:   HPI: Lisa Odom is a 66 y.o. female presenting on 08/26/2022 for Diabetes   Diabetes:   She has been working on healthy eating.. more salads less carbs. Lab Results  Component Value Date   HGBA1C 6.4 (A) 08/26/2022  Using medications without difficulties: Hypoglycemic episodes: Hyperglycemic episodes: Feet problems: none Blood Sugars averaging: not checking eye exam within last year:yes Associated with hypertension  Hypertension:   BP Readings from Last 3 Encounters:  08/26/22 138/70  05/16/22 (!) 140/80  04/30/22 132/80  Using medication without problems or lightheadedness:    none Chest pain with exertion: none Edema: none Short of breath: none Average home BPs: occ highs.. usually at home 140s/ Other issues:  Wt Readings from Last 3 Encounters:  08/26/22 155 lb 4 oz (70.4 kg)  05/16/22 155 lb (70.3 kg)  04/30/22 155 lb (70.3 kg)     She has been constipated and stools have been black.   No abd pain, no GERD.  On pantoprazole.  Hx of PUD.  She has been using aleve for  pain/inflammation in addition to meloxicam.  Not using peptobismol.      Relevant past medical, surgical, family and social history reviewed and updated as indicated. Interim medical history since our last visit reviewed. Allergies and medications reviewed and updated. Outpatient Medications Prior to Visit  Medication Sig Dispense Refill   Accu-Chek Softclix Lancets lancets USE TO CHECK BLOOD SUGAR 3  TIMES DAILY 300 each 2   alendronate (FOSAMAX) 70 MG tablet TAKE 1 TABLET BY MOUTH WEEKLY  WITH 8 OZ OF PLAIN WATER 30  MINUTES BEFORE FIRST FOOD, DRINK  OR MEDS. STAY UPRIGHT FOR 30  MINS 12 tablet 3   ALPRAZolam (XANAX) 0.5 MG tablet Take 0.5 mg by mouth 3 (three) times daily as needed for anxiety.      amphetamine-dextroamphetamine (ADDERALL) 7.5 MG tablet Take 7.5 mg by mouth 2 (two) times daily. Morning and after lunch     atorvastatin (LIPITOR) 40 MG tablet TAKE 1 TABLET BY MOUTH  DAILY 90 tablet 3   Blood Glucose Calibration (ACCU-CHEK GUIDE CONTROL) LIQD 1 each by In Vitro route daily as needed. 1 each 3   Blood Glucose Monitoring Suppl (ACCU-CHEK GUIDE ME) w/Device KIT USE IN THE MORNING , AT NOON,  AND AT BEDTIME 1 kit 0   buPROPion (WELLBUTRIN XL) 150 MG 24 hr tablet TAKE 1 TABLET BY MOUTH  DAILY 90 tablet 2   CALCIUM-VITAMIN D PO Take 600 mg by mouth 2 (two) times a day.      diclofenac Sodium (VOLTAREN) 1 % GEL Apply 2 g topically 4 (four) times daily as needed.     DULoxetine (CYMBALTA) 60 MG capsule TAKE 2 CAPSULES BY MOUTH  DAILY 180 capsule 2   fexofenadine (ALLEGRA ALLERGY) 180 MG tablet Take 1 tablet (180 mg total) by mouth daily.     gabapentin (NEURONTIN) 600 MG tablet TAKE ONE-HALF TABLET BY  MOUTH IN THE MORNING AND  AFTER LUNCH THEN TAKE  2  TABLETS AT BEDTIME 270 tablet 3   meloxicam (MOBIC) 15 MG tablet TAKE 1 TABLET BY MOUTH  DAILY 90 tablet 3   Multiple Vitamin (MULTIVITAMIN) tablet Take 1 tablet by mouth daily.     Omega-3 Fatty Acids (FISH OIL) 1200 MG CAPS Take 1,200 mg by mouth daily.      pantoprazole (PROTONIX) 40 MG tablet TAKE 1 TABLET BY MOUTH  DAILY 90 tablet 3   terbinafine (LAMISIL) 250 MG tablet Take 250 mg by mouth daily.     traZODone (DESYREL) 50 MG tablet TAKE 1/2 TO 1 TABLET(25 TO 50 MG) BY MOUTH AT BEDTIME AS NEEDED FOR SLEEP 30 tablet 0   No facility-administered medications prior to visit.     Per HPI unless specifically indicated in ROS section below Review of Systems  Constitutional:  Negative for fatigue and fever.  HENT:  Negative for congestion.   Eyes:  Negative for pain.  Respiratory:   Negative for cough and shortness of breath.   Cardiovascular:  Negative for chest pain, palpitations and leg swelling.  Gastrointestinal:  Positive for constipation. Negative for abdominal distention, abdominal pain, anal bleeding, blood in stool, nausea, rectal pain and vomiting.  Genitourinary:  Negative for dysuria and vaginal bleeding.  Musculoskeletal:  Negative for back pain.  Neurological:  Negative for syncope, light-headedness and headaches.  Psychiatric/Behavioral:  Negative for dysphoric mood.    Objective:  BP 138/70   Pulse 81   Temp 98 F (36.7 C) (Oral)   Ht _0  (1.549 m)   Wt 155 lb 4 oz (70.4 kg)   SpO2 94%   BMI 29.33 kg/m   Wt Readings from Last 3 Encounters:  08/26/22 155 lb 4 oz (70.4 kg)  05/16/22 155 lb (70.3 kg)  04/30/22 155 lb (70.3 kg)      Physical Exam Constitutional:      General: She is not in acute distress.    Appearance: Normal appearance. She is well-developed. She is not ill-appearing or toxic-appearing.  HENT:     Head: Normocephalic.     Right Ear: Hearing, tympanic membrane, ear canal and external ear normal. Tympanic membrane is not erythematous, retracted or bulging.     Left Ear: Hearing, tympanic membrane, ear canal and external ear normal. Tympanic membrane is not erythematous, retracted or bulging.     Nose: No mucosal edema or rhinorrhea.     Right Sinus: No maxillary sinus tenderness or frontal sinus tenderness.     Left Sinus: No maxillary sinus tenderness or frontal sinus tenderness.     Mouth/Throat:     Pharynx: Uvula midline.  Eyes:     General: Lids are normal. Lids are everted, no foreign bodies appreciated.     Conjunctiva/sclera: Conjunctivae normal.     Pupils: Pupils are equal, round, and reactive to light.  Neck:     Thyroid: No thyroid mass or thyromegaly.     Vascular: No carotid bruit.     Trachea: Trachea normal.  Cardiovascular:     Rate and Rhythm: Normal rate and regular rhythm.     Pulses: Normal  pulses.     Heart sounds: Normal heart sounds, S1 normal and S2 normal. No murmur heard.    No friction rub. No gallop.  Pulmonary:     Effort: Pulmonary effort is normal. No tachypnea or respiratory distress.     Breath sounds: Normal breath sounds. No decreased breath sounds, wheezing, rhonchi or rales.  Abdominal:     General:  Bowel sounds are normal.     Palpations: Abdomen is soft.     Tenderness: There is no abdominal tenderness.  Musculoskeletal:     Cervical back: Normal range of motion and neck supple.  Skin:    General: Skin is warm and dry.     Findings: No rash.  Neurological:     Mental Status: She is alert.  Psychiatric:        Mood and Affect: Mood is not anxious or depressed.        Speech: Speech normal.        Behavior: Behavior normal. Behavior is cooperative.        Thought Content: Thought content normal.        Judgment: Judgment normal.       Results for orders placed or performed in visit on 08/26/22  POCT glycosylated hemoglobin (Hb A1C)  Result Value Ref Range   Hemoglobin A1C 6.4 (A) 4.0 - 5.6 %   HbA1c POC (<> result, manual entry)     HbA1c, POC (prediabetic range)     HbA1c, POC (controlled diabetic range)       COVID 19 screen:  No recent travel or known exposure to COVID19 The patient denies respiratory symptoms of COVID 19 at this time. The importance of social distancing was discussed today.   Assessment and Plan    Problem List Items Addressed This Visit     Hypertension associated with diabetes ([place]) (Chronic)    Chronic, recent worsening possibly worsening due to Adderall.  She has not been out of blood pressure medicine for this in the past.  She will call in the next few weeks with a blood pressure update with her blood pressures at home.  We will likely need to start hydrochlorothiazide given the Adderall is helping her significantly.      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      Relevant Orders   POCT glycosylated hemoglobin (Hb A1C) (Completed)   Dark stools     Concern for possible recurrence of peptic ulcer disease although she does not have abdominal pain and continues on pantoprazole 40 mg daily.  She has recently restarted Aleve in addition to using meloxicam.  She will stop both of these.  We will send her home with stool cards to check to see if there is truly blood in her stool.  She will continue PPI.        [name], MD

## 2022-08-26 NOTE — Assessment & Plan Note (Signed)
Chronic, improved control A1c at goal with improved lifestyle changes. 

## 2022-08-26 NOTE — Assessment & Plan Note (Signed)
Chronic, recent worsening possibly worsening due to Adderall.  She has not been out of blood pressure medicine for this in the past.  She will call in the next few weeks with a blood pressure update with her blood pressures at home.  We will likely need to start hydrochlorothiazide given the Adderall is helping her significantly.

## 2022-08-26 NOTE — Patient Instructions (Addendum)
Work on low salt diet, decrease caffeine,  heart healthy low carb diet.  Get back to regular walking. Follow Blood pressure at home.Marland Kitchen goal < 140/90.Marland Kitchen  call  continue to be elevated for new medicine ( likel HCTZ)  Stop aleve and meloxicam.  Check stool card for blood.

## 2022-08-26 NOTE — Assessment & Plan Note (Signed)
  Concern for possible recurrence of peptic ulcer disease although she does not have abdominal pain and continues on pantoprazole 40 mg daily.  She has recently restarted Aleve in addition to using meloxicam.  She will stop both of these.  We will send her home with stool cards to check to see if there is truly blood in her stool.  She will continue PPI.

## 2022-09-01 ENCOUNTER — Other Ambulatory Visit: Payer: Self-pay

## 2022-09-01 ENCOUNTER — Other Ambulatory Visit (INDEPENDENT_AMBULATORY_CARE_PROVIDER_SITE_OTHER): Payer: Medicare Other

## 2022-09-01 DIAGNOSIS — R195 Other fecal abnormalities: Secondary | ICD-10-CM

## 2022-09-01 NOTE — Addendum Note (Signed)
Addended by: Carter Kitten on: 09/01/2022 12:23 PM   Modules accepted: Orders

## 2022-09-02 LAB — HEMOCCULT SLIDES (X 3 CARDS)
Fecal Occult Blood: NEGATIVE
OCCULT 1: NEGATIVE
OCCULT 2: NEGATIVE
OCCULT 3: NEGATIVE
OCCULT 4: NEGATIVE
OCCULT 5: NEGATIVE

## 2022-09-04 ENCOUNTER — Encounter: Payer: Medicare Other | Admitting: Podiatry

## 2022-09-08 ENCOUNTER — Encounter: Payer: Self-pay | Admitting: Podiatry

## 2022-09-08 ENCOUNTER — Other Ambulatory Visit: Payer: Self-pay | Admitting: Podiatry

## 2022-09-08 DIAGNOSIS — M89771 Major osseous defect, right ankle and foot: Secondary | ICD-10-CM | POA: Diagnosis not present

## 2022-09-08 DIAGNOSIS — M2011 Hallux valgus (acquired), right foot: Secondary | ICD-10-CM | POA: Diagnosis not present

## 2022-09-08 MED ORDER — IBUPROFEN 600 MG PO TABS: 600.0000 mg | ORAL_TABLET | Freq: Four times a day (QID) | ORAL | 0 refills | Status: AC | PRN

## 2022-09-08 MED ORDER — ACETAMINOPHEN 500 MG PO TABS: 1000.0000 mg | ORAL_TABLET | Freq: Four times a day (QID) | ORAL | 0 refills | Status: AC | PRN

## 2022-09-08 MED ORDER — OXYCODONE HCL 5 MG PO TABS: 5.0000 mg | ORAL_TABLET | ORAL | 0 refills | Status: AC | PRN

## 2022-09-08 NOTE — Progress Notes (Signed)
09/08/22 R foot MPJ fusion, bone graft from heel

## 2022-09-10 ENCOUNTER — Other Ambulatory Visit: Payer: Self-pay | Admitting: *Deleted

## 2022-09-10 MED ORDER — TRAZODONE HCL 50 MG PO TABS: ORAL_TABLET | ORAL | 1 refills | Status: DC

## 2022-09-14 ENCOUNTER — Other Ambulatory Visit: Payer: Self-pay | Admitting: Family Medicine

## 2022-09-16 ENCOUNTER — Ambulatory Visit (INDEPENDENT_AMBULATORY_CARE_PROVIDER_SITE_OTHER): Payer: Medicare Other | Admitting: Podiatry

## 2022-09-16 ENCOUNTER — Ambulatory Visit (INDEPENDENT_AMBULATORY_CARE_PROVIDER_SITE_OTHER): Payer: Medicare Other

## 2022-09-16 DIAGNOSIS — M2011 Hallux valgus (acquired), right foot: Secondary | ICD-10-CM | POA: Diagnosis not present

## 2022-09-16 DIAGNOSIS — M21611 Bunion of right foot: Secondary | ICD-10-CM | POA: Diagnosis not present

## 2022-09-17 NOTE — Progress Notes (Signed)
  Subjective:  Patient ID: Lisa Odom, female    DOB: 12-23-1955,  MRN: [id number]  Chief Complaint  Patient presents with   Routine Post Op    POV #1 DOS 09/08/2022 GREAT TOE FUSION OF RT FOOT W/BONE GRAFT FROM HEEL    66 y.o. female returns for post-op check.  She is doing well not having much pain  Review of Systems: Negative except as noted in the HPI. Denies N/V/F/Ch.   Objective:  There were no vitals filed for this visit. There is no height or weight on file to calculate BMI. Constitutional Well developed. Well nourished.  Vascular Foot warm and well perfused. Capillary refill normal to all digits.  Calf is soft and supple, no posterior calf or knee pain, negative Homans' sign  Neurologic Normal speech. Oriented to person, place, and time. Epicritic sensation to light touch grossly present bilaterally.  Dermatologic Skin healing well without signs of infection. Skin edges well coapted without signs of infection.  Orthopedic: Tenderness to palpation noted about the surgical site.   Multiple view plain film radiographs: Correction of hallux valgus deformity noted all hardware intact and in place no complications noted and equivalent to immediate postoperative films Assessment:   1. Hallux valgus with bunions, right    Plan:  Patient was evaluated and treated and all questions answered.  S/p foot surgery right -Progressing as expected post-operatively. -XR: Noted above -WB Status: Can begin weightbearing in CAM boot in 2 weeks -Sutures: Return for suture removal 2 weeks. -Foot redressed.  Can remove dressing Friday and bathe, reapply Steri-Strips as they come off  Return in about 2 weeks (around 09/30/2022) for suture removal.

## 2022-09-25 ENCOUNTER — Encounter: Payer: Medicare Other | Admitting: Podiatry

## 2022-09-30 ENCOUNTER — Ambulatory Visit (INDEPENDENT_AMBULATORY_CARE_PROVIDER_SITE_OTHER): Payer: Medicare Other | Admitting: Podiatry

## 2022-09-30 DIAGNOSIS — M21611 Bunion of right foot: Secondary | ICD-10-CM

## 2022-09-30 DIAGNOSIS — M2011 Hallux valgus (acquired), right foot: Secondary | ICD-10-CM

## 2022-09-30 NOTE — Progress Notes (Signed)
  Subjective:  Patient ID: Lisa Odom, female    DOB: May 14, 1956,  MRN: [id number]  Chief Complaint  Patient presents with   Routine Post Op    POV #2 DOS 09/08/2022 GREAT TOE FUSION OF RT FOOT W/BONE GRAFT FROM HEEL    67 y.o. female returns for post-op check.  She is concerned about the position of the toe  Review of Systems: Negative except as noted in the HPI. Denies N/V/F/Ch.   Objective:  There were no vitals filed for this visit. There is no height or weight on file to calculate BMI. Constitutional Well developed. Well nourished.  Vascular Foot warm and well perfused. Capillary refill normal to all digits.  Calf is soft and supple, no posterior calf or knee pain, negative Homans' sign  Neurologic Normal speech. Oriented to person, place, and time. Epicritic sensation to light touch grossly present bilaterally.  Dermatologic Incisions are well-healed and not hypertrophic  Orthopedic: She has mild edema, very little to no tenderness to palpation noted about the surgical site.   Multiple view plain film radiographs: Correction of hallux valgus deformity noted all hardware intact and in place no complications noted and equivalent to immediate postoperative films Assessment:   1. Hallux valgus with bunions, right     Plan:  Patient was evaluated and treated and all questions answered.  S/p foot surgery right -Sutures removed uneventfully.  She may continue regular bathing.  May begin WBAT in the cam walker boot.  Return in 3 weeks for x-rays and we will transition to a surgical shoe at that point if we are seeing good bone healing.  We discussed the position of the toe and that the gap between the first and second toe is secondary to the significant metatarsus adductus.  Unfortunately this will not improve due to the fusion.  I do not think her bunion will come back though.  This is symmetric with her other side  Return in about 3 weeks (around 10/21/2022) for post op  (new x-rays).

## 2022-10-03 ENCOUNTER — Other Ambulatory Visit: Payer: Self-pay | Admitting: Family Medicine

## 2022-10-05 NOTE — Telephone Encounter (Signed)
Last office visit 08/26/22 for DM.  Last refilled 09/02/21 for #270 with 3 refills.  Next Appt: 11/26/22 for DM.

## 2022-10-21 ENCOUNTER — Ambulatory Visit (INDEPENDENT_AMBULATORY_CARE_PROVIDER_SITE_OTHER): Payer: Medicare Other

## 2022-10-21 ENCOUNTER — Ambulatory Visit (INDEPENDENT_AMBULATORY_CARE_PROVIDER_SITE_OTHER): Payer: Medicare Other | Admitting: Podiatry

## 2022-10-21 DIAGNOSIS — M2011 Hallux valgus (acquired), right foot: Secondary | ICD-10-CM

## 2022-10-21 DIAGNOSIS — M21611 Bunion of right foot: Secondary | ICD-10-CM

## 2022-10-21 DIAGNOSIS — M19271 Secondary osteoarthritis, right ankle and foot: Secondary | ICD-10-CM | POA: Diagnosis not present

## 2022-10-21 NOTE — Progress Notes (Signed)
  Subjective:  Patient ID: Lisa Odom, female    DOB: [birth year],  MRN: [id number]  Chief Complaint  Patient presents with   Routine Post Op    POV #3 DOS 09/08/2022 GREAT TOE FUSION OF RT FOOT W/BONE GRAFT FROM HEEL    67 y.o. female returns for post-op check.  Doing well pain is improving  Review of Systems: Negative except as noted in the HPI. Denies N/V/F/Ch.   Objective:  There were no vitals filed for this visit. There is no height or weight on file to calculate BMI. Constitutional Well developed. Well nourished.  Vascular Foot warm and well perfused. Capillary refill normal to all digits.  Calf is soft and supple, no posterior calf or knee pain, negative Homans' sign  Neurologic Normal speech. Oriented to person, place, and time. Epicritic sensation to light touch grossly present bilaterally.  Dermatologic Incisions are well-healed and not hypertrophic  Orthopedic: She has mild edema, very little to no tenderness to palpation noted about the surgical site.   Multiple view plain film radiographs: Correction of hallux valgus deformity noted all hardware intact and in place no complications noted and good consolidation across fusion site Assessment:   1. Hallux valgus with bunions, right   2. Other secondary osteoarthritis of right foot     Plan:  Patient was evaluated and treated and all questions answered.  S/p foot surgery right -She is doing very well from a radiographic standpoint her bone appears to be nearly fully healed.  She may resume and return to regular shoe gear as tolerated over the next few weeks.  We will take new radiographs in 6 weeks.  Return in about 6 weeks (around 12/02/2022) for post op (new x-rays).

## 2022-11-05 ENCOUNTER — Other Ambulatory Visit: Payer: Self-pay | Admitting: Family Medicine

## 2022-11-05 DIAGNOSIS — G894 Chronic pain syndrome: Secondary | ICD-10-CM

## 2022-11-18 ENCOUNTER — Telehealth: Payer: Self-pay | Admitting: Family Medicine

## 2022-11-18 DIAGNOSIS — E1140 Type 2 diabetes mellitus with diabetic neuropathy, unspecified: Secondary | ICD-10-CM

## 2022-11-18 NOTE — Telephone Encounter (Signed)
-----   Message from Velna Hatchet, RT sent at 11/03/2022 11:48 AM EST ----- Regarding: Wed 2/21 lab Future lab orders needed for labs only appt on 11/19/22, please.  Thanks, Anda Kraft

## 2022-11-19 ENCOUNTER — Other Ambulatory Visit (INDEPENDENT_AMBULATORY_CARE_PROVIDER_SITE_OTHER): Payer: Medicare Other

## 2022-11-19 DIAGNOSIS — E1140 Type 2 diabetes mellitus with diabetic neuropathy, unspecified: Secondary | ICD-10-CM

## 2022-11-19 LAB — COMPREHENSIVE METABOLIC PANEL
ALT: 18 U/L (ref 0–35)
AST: 24 U/L (ref 0–37)
Albumin: 4.3 g/dL (ref 3.5–5.2)
Alkaline Phosphatase: 77 U/L (ref 39–117)
BUN: 15 mg/dL (ref 6–23)
CO2: 27 mEq/L (ref 19–32)
Calcium: 9.3 mg/dL (ref 8.4–10.5)
Chloride: 104 mEq/L (ref 96–112)
Creatinine, Ser: 0.75 mg/dL (ref 0.40–1.20)
GFR: 81.48 mL/min (ref >60.00)
Glucose, Bld: 97 mg/dL (ref 70–99)
Potassium: 4.2 mEq/L (ref 3.5–5.1)
Sodium: 140 mEq/L (ref 135–145)
Total Bilirubin: 0.3 mg/dL (ref 0.2–1.2)
Total Protein: 7.2 g/dL (ref 6.0–8.3)

## 2022-11-19 LAB — LIPID PANEL
Cholesterol: 125 mg/dL (ref 0–200)
HDL: 59.5 mg/dL (ref >39.00)
LDL Cholesterol: 42 mg/dL (ref 0–99)
NonHDL: 65.89
Total CHOL/HDL Ratio: 2
Triglycerides: 118 mg/dL (ref 0.0–149.0)
VLDL: 23.6 mg/dL (ref 0.0–40.0)

## 2022-11-19 LAB — HEMOGLOBIN A1C: Hgb A1c MFr Bld: 6.8 % — ABNORMAL HIGH (ref 4.6–6.5)

## 2022-11-20 LAB — MICROALBUMIN / CREATININE URINE RATIO
Creatinine,U: 112.1 mg/dL
Microalb Creat Ratio: 1.6 mg/g (ref 0.0–30.0)
Microalb, Ur: 1.8 mg/dL (ref 0.0–1.9)

## 2022-11-20 NOTE — Progress Notes (Signed)
No critical labs need to be addressed urgently. We will discuss labs in detail at upcoming office visit.   

## 2022-11-26 ENCOUNTER — Encounter: Payer: Self-pay | Admitting: Family Medicine

## 2022-11-26 ENCOUNTER — Ambulatory Visit (INDEPENDENT_AMBULATORY_CARE_PROVIDER_SITE_OTHER): Payer: Medicare Other | Admitting: Family Medicine

## 2022-11-26 VITALS — BP 162/80 | HR 89 | Temp 97.7°F | Ht 61.0 in | Wt 152.0 lb

## 2022-11-26 DIAGNOSIS — E785 Hyperlipidemia, unspecified: Secondary | ICD-10-CM | POA: Diagnosis not present

## 2022-11-26 DIAGNOSIS — E1159 Type 2 diabetes mellitus with other circulatory complications: Secondary | ICD-10-CM

## 2022-11-26 DIAGNOSIS — E1169 Type 2 diabetes mellitus with other specified complication: Secondary | ICD-10-CM

## 2022-11-26 DIAGNOSIS — J309 Allergic rhinitis, unspecified: Secondary | ICD-10-CM

## 2022-11-26 DIAGNOSIS — H524 Presbyopia: Secondary | ICD-10-CM | POA: Diagnosis not present

## 2022-11-26 DIAGNOSIS — I152 Hypertension secondary to endocrine disorders: Secondary | ICD-10-CM

## 2022-11-26 DIAGNOSIS — E119 Type 2 diabetes mellitus without complications: Secondary | ICD-10-CM | POA: Diagnosis not present

## 2022-11-26 DIAGNOSIS — H5203 Hypermetropia, bilateral: Secondary | ICD-10-CM | POA: Diagnosis not present

## 2022-11-26 LAB — HM DIABETES EYE EXAM

## 2022-11-26 MED ORDER — MONTELUKAST SODIUM 10 MG PO TABS: 10.0000 mg | ORAL_TABLET | Freq: Every day | ORAL | 11 refills | Status: DC

## 2022-11-26 NOTE — Patient Instructions (Signed)
Use nasal saline spray or irrigation.  Start Singulair at bedtime for allergies.. call for possible allergist referral if not improving in 1 month.

## 2022-11-26 NOTE — Assessment & Plan Note (Signed)
Chronic, improved control A1c at goal with improved lifestyle changes.

## 2022-11-26 NOTE — Assessment & Plan Note (Signed)
Stable, chronic.  Continue current medication.   Atorvastatin 40 mg p.o. daily

## 2022-11-26 NOTE — Progress Notes (Signed)
Patient ID: Lisa Odom, female    DOB: October 15, 1955, 67 y.o.   MRN: [id number]  This visit was conducted in person.  BP (!) 156/80 (BP Location: Left Arm, Patient Position: Sitting, Cuff Size: Normal)   Pulse 89   Temp 97.7 F (36.5 C) (Temporal)   Ht '5\' 1"'$  (1.549 m)   Wt 152 lb (68.9 kg)   SpO2 96%   BMI 28.72 kg/m    CC:  Chief Complaint  Patient presents with   Diabetes    Subjective:   HPI: Lisa Odom is a 67 y.o. female presenting on 11/26/2022 for Diabetes   Diabetes:   She has been working on [employer] , had lost  Lab Results  Component Value Date   HGBA1C 6.8 (H) 11/19/2022  Using medications without difficulties: Hypoglycemic episodes: Hyperglycemic episodes: Feet problems: none Blood Sugars averaging:  FBS 90-112 eye exam within last year:yes Associated with hypertension  Wt Readings from Last 3 Encounters:  11/26/22 152 lb (68.9 kg)  08/26/22 155 lb 4 oz (70.4 kg)  05/16/22 155 lb (70.3 kg)  Body mass index is 28.72 kg/m.    Hypertension:  Inadequate control in office today.. BP Readings from Last 3 Encounters:  11/26/22 (!) 156/80  08/26/22 138/70  05/16/22 (!) 140/80  Using medication without problems or lightheadedness:    none Chest pain with exertion: none,  occ pain in right upper chest wall off and on, last few moments, sore touch. Edema: none Short of breath: none Average home BPs: occ highs.. usually at home 120/70 Other issues:  Less active give foot surgery 08/2022.. cleared now to walk some but cannot wear sneaker.  Plans to return to gym.  Wt Readings from Last 3 Encounters:  11/26/22 152 lb (68.9 kg)  08/26/22 155 lb 4 oz (70.4 kg)  05/16/22 155 lb (70.3 kg)    Elevated Cholesterol: LDL at goal on atorvastatin 40 mg daily Using medications without problems: none Muscle aches: none     Has chronic sinus pressure and runny nose... 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 Has tried many OTC nasal spray  including flonase. Minimal benefit.  Clear mucus.      Relevant past medical, surgical, family and social history reviewed and updated as indicated. Interim medical history since our last visit reviewed. Allergies and medications reviewed and updated. Outpatient Medications Prior to Visit  Medication Sig Dispense Refill   Accu-Chek Softclix Lancets lancets USE TO CHECK BLOOD SUGAR 3  TIMES DAILY 300 each 2   alendronate (FOSAMAX) 70 MG tablet TAKE 1 TABLET BY MOUTH WEEKLY  WITH 8 OZ OF PLAIN WATER 30  MINUTES BEFORE FIRST FOOD, DRINK OR MEDS. STAY UPRIGHT FOR 30  MINS 12 tablet 3   ALPRAZolam (XANAX) 0.5 MG tablet Take 0.5 mg by mouth 3 (three) times daily as needed for anxiety.      amphetamine-dextroamphetamine (ADDERALL) 7.5 MG tablet Take 7.5 mg by mouth 2 (two) times daily. Morning and after lunch     atorvastatin (LIPITOR) 40 MG tablet TAKE 1 TABLET BY MOUTH DAILY 100 tablet 1   Blood Glucose Calibration (ACCU-CHEK GUIDE CONTROL) LIQD 1 each by In Vitro route daily as needed. 1 each 3   Blood Glucose Monitoring Suppl (ACCU-CHEK GUIDE ME) w/Device KIT USE IN THE MORNING , AT NOON,  AND AT BEDTIME 1 kit 0   buPROPion (WELLBUTRIN XL) 150 MG 24 hr tablet TAKE 1 TABLET BY MOUTH  DAILY 90 tablet 2  CALCIUM-VITAMIN D PO Take 600 mg by mouth 2 (two) times a day.      diclofenac Sodium (VOLTAREN) 1 % GEL Apply 2 g topically 4 (four) times daily as needed.     DULoxetine (CYMBALTA) 60 MG capsule TAKE 2 CAPSULES BY MOUTH  DAILY 180 capsule 2   gabapentin (NEURONTIN) 600 MG tablet TAKE ONE-HALF TABLET BY MOUTH IN THE MORNING AND AFTER LUNCH THEN TAKE 2 TABLETS BY MOUTH AT  BEDTIME 300 tablet 2   Multiple Vitamin (MULTIVITAMIN) tablet Take 1 tablet by mouth daily.     Omega-3 Fatty Acids (FISH OIL) 1200 MG CAPS Take 1,200 mg by mouth daily.      pantoprazole (PROTONIX) 40 MG tablet TAKE 1 TABLET BY MOUTH DAILY 100 tablet 1   traZODone (DESYREL) 50 MG tablet TAKE 1/2 TO 1 TABLET(25 TO 50 MG) BY  MOUTH AT BEDTIME AS NEEDED FOR SLEEP 90 tablet 1   fexofenadine (ALLEGRA ALLERGY) 180 MG tablet Take 1 tablet (180 mg total) by mouth daily. (Patient not taking: Reported on 11/26/2022)     terbinafine (LAMISIL) 250 MG tablet Take 250 mg by mouth daily.     No facility-administered medications prior to visit.     Per HPI unless specifically indicated in ROS section below Review of Systems  Constitutional:  Negative for fatigue and fever.  HENT:  Negative for congestion.   Eyes:  Negative for pain.  Respiratory:  Negative for cough and shortness of breath.   Cardiovascular:  Negative for chest pain, palpitations and leg swelling.  Gastrointestinal:  Positive for constipation. Negative for abdominal distention, abdominal pain, anal bleeding, blood in stool, nausea, rectal pain and vomiting.  Genitourinary:  Negative for dysuria and vaginal bleeding.  Musculoskeletal:  Negative for back pain.  Neurological:  Negative for syncope, light-headedness and headaches.  Psychiatric/Behavioral:  Negative for dysphoric mood.    Objective:  BP (!) 156/80 (BP Location: Left Arm, Patient Position: Sitting, Cuff Size: Normal)   Pulse 89   Temp 97.7 F (36.5 C) (Temporal)   Ht '5\' 1"'$  (1.549 m)   Wt 152 lb (68.9 kg)   SpO2 96%   BMI 28.72 kg/m   Wt Readings from Last 3 Encounters:  11/26/22 152 lb (68.9 kg)  08/26/22 155 lb 4 oz (70.4 kg)  05/16/22 155 lb (70.3 kg)      Physical Exam Constitutional:      General: She is not in acute distress.    Appearance: Normal appearance. She is well-developed. She is not ill-appearing or toxic-appearing.  HENT:     Head: Normocephalic.     Right Ear: Hearing, tympanic membrane, ear canal and external ear normal. Tympanic membrane is not erythematous, retracted or bulging.     Left Ear: Hearing, tympanic membrane, ear canal and external ear normal. Tympanic membrane is not erythematous, retracted or bulging.     Nose: No mucosal edema or rhinorrhea.      Right Sinus: No maxillary sinus tenderness or frontal sinus tenderness.     Left Sinus: No maxillary sinus tenderness or frontal sinus tenderness.     Mouth/Throat:     Pharynx: Uvula midline.  Eyes:     General: Lids are normal. Lids are everted, no foreign bodies appreciated.     Conjunctiva/sclera: Conjunctivae normal.     Pupils: Pupils are equal, round, and reactive to light.  Neck:     Thyroid: No thyroid mass or thyromegaly.     Vascular: No carotid bruit.  Trachea: Trachea normal.  Cardiovascular:     Rate and Rhythm: Normal rate and regular rhythm.     Pulses: Normal pulses.     Heart sounds: Normal heart sounds, S1 normal and S2 normal. No murmur heard.    No friction rub. No gallop.  Pulmonary:     Effort: Pulmonary effort is normal. No tachypnea or respiratory distress.     Breath sounds: Normal breath sounds. No decreased breath sounds, wheezing, rhonchi or rales.  Abdominal:     General: Bowel sounds are normal.     Palpations: Abdomen is soft.     Tenderness: There is no abdominal tenderness.  Musculoskeletal:     Cervical back: Normal range of motion and neck supple.  Skin:    General: Skin is warm and dry.     Findings: No rash.  Neurological:     Mental Status: She is alert.  Psychiatric:        Mood and Affect: Mood is not anxious or depressed.        Speech: Speech normal.        Behavior: Behavior normal. Behavior is cooperative.        Thought Content: Thought content normal.        Judgment: Judgment normal.       Results for orders placed or performed in visit on 11/19/22  Microalbumin / creatinine urine ratio  Result Value Ref Range   Microalb, Ur 1.8 0.0 - 1.9 mg/dL   Creatinine,U 112.1 mg/dL   Microalb Creat Ratio 1.6 0.0 - 30.0 mg/g  Comprehensive metabolic panel  Result Value Ref Range   Sodium 140 135 - 145 mEq/L   Potassium 4.2 3.5 - 5.1 mEq/L   Chloride 104 96 - 112 mEq/L   CO2 27 19 - 32 mEq/L   Glucose, Bld 97 70 - 99 mg/dL    BUN 15 6 - 23 mg/dL   Creatinine, Ser 0.75 0.40 - 1.20 mg/dL   Total Bilirubin 0.3 0.2 - 1.2 mg/dL   Alkaline Phosphatase 77 39 - 117 U/L   AST 24 0 - 37 U/L   ALT 18 0 - 35 U/L   Total Protein 7.2 6.0 - 8.3 g/dL   Albumin 4.3 3.5 - 5.2 g/dL   GFR 81.48 >60.00 mL/min   Calcium 9.3 8.4 - 10.5 mg/dL  Lipid panel  Result Value Ref Range   Cholesterol 125 0 - 200 mg/dL   Triglycerides 118.0 0.0 - 149.0 mg/dL   HDL 59.50 >39.00 mg/dL   VLDL 23.6 0.0 - 40.0 mg/dL   LDL Cholesterol 42 0 - 99 mg/dL   Total CHOL/HDL Ratio 2    NonHDL 65.89   Hemoglobin A1c  Result Value Ref Range   Hgb A1c MFr Bld 6.8 (H) 4.6 - 6.5 %     COVID 19 screen:  No recent travel or known exposure to COVID19 The patient denies respiratory symptoms of COVID 19 at this time. The importance of social distancing was discussed today.   Assessment and Plan    Problem List Items Addressed This Visit     Allergic sinusitis    Chronic, no clear sign of associated infection viral or bacterial.  Symptoms are most likely due to chronic rhinitis/allergic sinusitis.  She has had minimal improvement with multiple nasal sprays including Flonase as well as multiple antihistamines including Allegra, Zyrtec and Xyzal.  We will try a trial of Singulair.  If her symptoms are not improving over the next month  she will contact me for possible referral to allergist.      Hyperlipidemia associated with type 2 diabetes mellitus ([place]) (Chronic)    Stable, chronic.  Continue current medication.   Atorvastatin 40 mg p.o. 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       [name], MD

## 2022-11-26 NOTE — Assessment & Plan Note (Signed)
Chronic, no clear sign of associated infection viral or bacterial.  Symptoms are most likely due to chronic rhinitis/allergic sinusitis.  She has had minimal improvement with multiple nasal sprays including Flonase as well as multiple antihistamines including Allegra, Zyrtec and Xyzal.  We will try a trial of Singulair.  If her symptoms are not improving over the next month she will contact me for possible referral to allergist.

## 2022-12-02 ENCOUNTER — Ambulatory Visit (INDEPENDENT_AMBULATORY_CARE_PROVIDER_SITE_OTHER): Payer: Medicare Other | Admitting: Podiatry

## 2022-12-02 ENCOUNTER — Ambulatory Visit (INDEPENDENT_AMBULATORY_CARE_PROVIDER_SITE_OTHER): Payer: Medicare Other

## 2022-12-02 DIAGNOSIS — M2011 Hallux valgus (acquired), right foot: Secondary | ICD-10-CM | POA: Diagnosis not present

## 2022-12-02 DIAGNOSIS — M21611 Bunion of right foot: Secondary | ICD-10-CM | POA: Diagnosis not present

## 2022-12-03 NOTE — Progress Notes (Signed)
  Subjective:  Patient ID: Lisa Odom, female    DOB: 10/23/1955,  MRN: [id number]  Chief Complaint  Patient presents with   Routine Post Op    POV #4 DOS 09/08/2022 GREAT TOE FUSION OF RT FOOT W/BONE GRAFT FROM HEEL    67 y.o. female returns for post-op check.  Doing well she is having no pain back in regular shoes  Review of Systems: Negative except as noted in the HPI. Denies N/V/F/Ch.   Objective:  There were no vitals filed for this visit. There is no height or weight on file to calculate BMI. Constitutional Well developed. Well nourished.  Vascular Foot warm and well perfused. Capillary refill normal to all digits.  Calf is soft and supple, no posterior calf or knee pain, negative Homans' sign  Neurologic Normal speech. Oriented to person, place, and time. Epicritic sensation to light touch grossly present bilaterally.  Dermatologic Incisions are well-healed and not hypertrophic  Orthopedic: She has no edema, no tenderness to palpation noted about the surgical site.   Multiple view plain film radiographs: Correction of hallux valgus deformity noted all hardware intact and in place no complications noted and complete consolidation across fusion site Assessment:   1. Hallux valgus with bunions, right     Plan:  Patient was evaluated and treated and all questions answered.  S/p foot surgery right -Doing well and is fully healed.  She may return to regular shoe gear and activity as tolerated.  I have no restrictions for her.  We discussed the presence of the gap in the first and second toe created by fusion with metatarsus adductus deformity.  Currently this is asymptomatic for her, if it becomes bothersome for her long-term she will let me know if it is an issue but she typically wears closed shoes.  Return if symptoms worsen or fail to improve.

## 2022-12-19 ENCOUNTER — Ambulatory Visit (INDEPENDENT_AMBULATORY_CARE_PROVIDER_SITE_OTHER): Payer: Medicare Other | Admitting: Family Medicine

## 2022-12-19 ENCOUNTER — Encounter: Payer: Self-pay | Admitting: Family Medicine

## 2022-12-19 VITALS — BP 140/80 | HR 84 | Temp 97.7°F | Ht 61.0 in | Wt 152.5 lb

## 2022-12-19 DIAGNOSIS — J0100 Acute maxillary sinusitis, unspecified: Secondary | ICD-10-CM

## 2022-12-19 MED ORDER — AMOXICILLIN 500 MG PO CAPS: 1000.0000 mg | ORAL_CAPSULE | Freq: Two times a day (BID) | ORAL | 0 refills | Status: DC

## 2022-12-19 NOTE — Assessment & Plan Note (Signed)
Acute, most likely initial viral infection versus allergic issue now with bacterial superinfection given going on greater than 7 to 10 days.  Also patient with Late onset unilateral facial pain on the right. Will treat with amoxicillin 500 mg 2 capsules twice daily x 10 days. Continue current over-the-counter medications for symptomatic care.  Return and ER precautions provided.

## 2022-12-19 NOTE — Progress Notes (Signed)
Patient ID: Lisa Odom, female    DOB: 1955/10/23, 67 y.o.   MRN: [id number]  This visit was conducted in person.  BP (!) 140/80   Pulse 84   Temp 97.7 F (36.5 C) (Temporal)   Ht 5\' 1"  (1.549 m)   Wt 152 lb 8 oz (69.2 kg)   SpO2 96%   BMI 28.81 kg/m    CC:  Chief Complaint  Patient presents with   Hoarse    Symptoms started a week ago Negative Home Covid Test yesterday   Cough   Nasal Congestion   Sinus Drainage    Non Stop and Ugly"    Subjective:   HPI: Lisa Odom is a 67 y.o. female presenting on 12/19/2022 for Hoarse (Symptoms started a week ago/Negative Home Covid Test yesterday), Cough, Nasal Congestion, and Sinus Drainage (Non Stop and Ugly")   Date of onset:  1 week Initial symptoms included runny nose Symptoms progressed to worsened post nasal drip, no nasal discharge  Cough from post nasal drip.  OND.Marland Kitchen green and yucky.  No sneeze,  one day of itchy eyes. New face pain in last 24 hours.  Bilateral ear pain, mainly left ear. No SOB, no wheeze.  No fever   Sick contacts: none  COVID testing:    negative     She has tried to treat with  mucinex nasal spray.OTC cough syrup, Singulair.     No history of chronic lung disease such as asthma or COPD. Non-smoker.       Relevant past medical, surgical, family and social history reviewed and updated as indicated. Interim medical history since our last visit reviewed. Allergies and medications reviewed and updated. Outpatient Medications Prior to Visit  Medication Sig Dispense Refill   Accu-Chek Softclix Lancets lancets USE TO CHECK BLOOD SUGAR 3  TIMES DAILY 300 each 2   alendronate (FOSAMAX) 70 MG tablet TAKE 1 TABLET BY MOUTH WEEKLY  WITH 8 OZ OF PLAIN WATER 30  MINUTES BEFORE FIRST FOOD, DRINK OR MEDS. STAY UPRIGHT FOR 30  MINS 12 tablet 3   ALPRAZolam (XANAX) 0.5 MG tablet Take 0.5 mg by mouth 3 (three) times daily as needed for anxiety.      amphetamine-dextroamphetamine (ADDERALL)  7.5 MG tablet Take 7.5 mg by mouth 2 (two) times daily. Morning and after lunch     atorvastatin (LIPITOR) 40 MG tablet TAKE 1 TABLET BY MOUTH DAILY 100 tablet 1   Blood Glucose Calibration (ACCU-CHEK GUIDE CONTROL) LIQD 1 each by In Vitro route daily as needed. 1 each 3   Blood Glucose Monitoring Suppl (ACCU-CHEK GUIDE ME) w/Device KIT USE IN THE MORNING , AT NOON,  AND AT BEDTIME 1 kit 0   buPROPion (WELLBUTRIN XL) 150 MG 24 hr tablet TAKE 1 TABLET BY MOUTH  DAILY 90 tablet 2   CALCIUM-VITAMIN D PO Take 600 mg by mouth 2 (two) times a day.      diclofenac Sodium (VOLTAREN) 1 % GEL Apply 2 g topically 4 (four) times daily as needed.     DULoxetine (CYMBALTA) 60 MG capsule TAKE 2 CAPSULES BY MOUTH  DAILY 180 capsule 2   gabapentin (NEURONTIN) 600 MG tablet TAKE ONE-HALF TABLET BY MOUTH IN THE MORNING AND AFTER LUNCH THEN TAKE 2 TABLETS BY MOUTH AT  BEDTIME 300 tablet 2   montelukast (SINGULAIR) 10 MG tablet Take 1 tablet (10 mg total) by mouth at bedtime. 30 tablet 11   Multiple Vitamin (MULTIVITAMIN) tablet Take 1  tablet by mouth daily.     Omega-3 Fatty Acids (FISH OIL) 1200 MG CAPS Take 1,200 mg by mouth daily.      pantoprazole (PROTONIX) 40 MG tablet TAKE 1 TABLET BY MOUTH DAILY 100 tablet 1   traZODone (DESYREL) 50 MG tablet TAKE 1/2 TO 1 TABLET(25 TO 50 MG) BY MOUTH AT BEDTIME AS NEEDED FOR SLEEP 90 tablet 1   No facility-administered medications prior to visit.     Per HPI unless specifically indicated in ROS section below Review of Systems  Constitutional:  Negative for fatigue and fever.  HENT:  Positive for congestion, postnasal drip and sinus pain. Negative for sore throat.   Eyes:  Negative for pain.  Respiratory:  Positive for cough. Negative for shortness of breath.   Cardiovascular:  Negative for chest pain, palpitations and leg swelling.  Gastrointestinal:  Negative for abdominal pain.  Genitourinary:  Negative for dysuria and vaginal bleeding.  Musculoskeletal:  Negative  for back pain.  Neurological:  Negative for syncope, light-headedness and headaches.  Psychiatric/Behavioral:  Negative for dysphoric mood.    Objective:  BP (!) 140/80   Pulse 84   Temp 97.7 F (36.5 C) (Temporal)   Ht 5\' 1"  (1.549 m)   Wt 152 lb 8 oz (69.2 kg)   SpO2 96%   BMI 28.81 kg/m   Wt Readings from Last 3 Encounters:  12/19/22 152 lb 8 oz (69.2 kg)  11/26/22 152 lb (68.9 kg)  08/26/22 155 lb 4 oz (70.4 kg)      Physical Exam Constitutional:      General: She is not in acute distress.    Appearance: She is well-developed. She is not ill-appearing or toxic-appearing.  HENT:     Head: Normocephalic.     Right Ear: Hearing, tympanic membrane, ear canal and external ear normal. Tympanic membrane is not erythematous, retracted or bulging.     Left Ear: Hearing, tympanic membrane, ear canal and external ear normal. Tympanic membrane is not erythematous, retracted or bulging.     Nose: Mucosal edema and rhinorrhea present.     Right Sinus: Maxillary sinus tenderness present. No frontal sinus tenderness.     Left Sinus: No maxillary sinus tenderness or frontal sinus tenderness.     Mouth/Throat:     Pharynx: Uvula midline.  Eyes:     General: Lids are normal. Lids are everted, no foreign bodies appreciated.     Conjunctiva/sclera: Conjunctivae normal.     Pupils: Pupils are equal, round, and reactive to light.  Neck:     Thyroid: No thyroid mass or thyromegaly.     Vascular: No carotid bruit.     Trachea: Trachea normal.  Cardiovascular:     Rate and Rhythm: Normal rate and regular rhythm.     Pulses: Normal pulses.     Heart sounds: Normal heart sounds, S1 normal and S2 normal. No murmur heard.    No friction rub. No gallop.  Pulmonary:     Effort: Pulmonary effort is normal. No tachypnea or respiratory distress.     Breath sounds: Normal breath sounds. No decreased breath sounds, wheezing, rhonchi or rales.  Musculoskeletal:     Cervical back: Normal range of  motion and neck supple.  Skin:    General: Skin is warm and dry.     Findings: No rash.  Neurological:     Mental Status: She is alert.  Psychiatric:        Mood and Affect: Mood is not anxious  or depressed.        Speech: Speech normal.        Behavior: Behavior normal. Behavior is cooperative.        Judgment: Judgment normal.       Results for orders placed or performed in visit on 12/08/22  HM DIABETES EYE EXAM  Result Value Ref Range   HM Diabetic Eye Exam No Retinopathy No Retinopathy    Assessment and Plan  Acute non-recurrent maxillary sinusitis Assessment & Plan: Acute, most likely initial viral infection versus allergic issue now with bacterial superinfection given going on greater than 7 to 10 days.  Also patient with Late onset unilateral facial pain on the right. Will treat with amoxicillin 500 mg 2 capsules twice daily x 10 days. Continue current over-the-counter medications for symptomatic care.  Return and ER precautions provided.   Other orders -     Amoxicillin; Take 2 capsules (1,000 mg total) by mouth 2 (two) times daily.  Dispense: 40 capsule; Refill: 0    Return if symptoms worsen or fail to improve.   [name], MD

## 2022-12-19 NOTE — Patient Instructions (Signed)
Voice rest.  Complete a course of antibiotics.  Start nasal saline spray.  Continue OTC cough meds if needed.

## 2023-03-06 ENCOUNTER — Other Ambulatory Visit: Payer: Self-pay | Admitting: Family Medicine

## 2023-03-09 ENCOUNTER — Other Ambulatory Visit: Payer: Self-pay | Admitting: Family Medicine

## 2023-03-09 NOTE — Telephone Encounter (Signed)
LAST APPOINTMENT DATE: 12/19/2022 for sinusitis, 11/26/22 for f/u   NEXT APPOINTMENT DATE: Visit date not found    LAST REFILL: 03/16/22  QTY: #12 w/ 3 refills

## 2023-04-05 ENCOUNTER — Other Ambulatory Visit: Payer: Self-pay | Admitting: Family Medicine

## 2023-04-08 NOTE — Progress Notes (Signed)
Dos 09/08/2022  Great toe fusion of right foot with bone graft from heel

## 2023-04-13 ENCOUNTER — Other Ambulatory Visit: Payer: Self-pay | Admitting: Family Medicine

## 2023-04-13 DIAGNOSIS — G894 Chronic pain syndrome: Secondary | ICD-10-CM

## 2023-04-14 NOTE — Telephone Encounter (Signed)
Please call and schedule Medicare Wellness with nurse and CPE with fasting labs prior with Dr. Ermalene Searing.  Per previous AVS:  Return in about 6 months (around 05/27/2023) for phone AMW,  fasting labs then CPE with me.

## 2023-04-14 NOTE — Telephone Encounter (Signed)
Lvmtcb, sent mychart message  

## 2023-04-27 ENCOUNTER — Other Ambulatory Visit: Payer: Self-pay | Admitting: Family Medicine

## 2023-04-29 ENCOUNTER — Encounter (INDEPENDENT_AMBULATORY_CARE_PROVIDER_SITE_OTHER): Payer: Self-pay

## 2023-05-04 ENCOUNTER — Telehealth: Payer: Self-pay | Admitting: *Deleted

## 2023-05-04 DIAGNOSIS — M810 Age-related osteoporosis without current pathological fracture: Secondary | ICD-10-CM

## 2023-05-04 DIAGNOSIS — E1169 Type 2 diabetes mellitus with other specified complication: Secondary | ICD-10-CM

## 2023-05-04 DIAGNOSIS — E1159 Type 2 diabetes mellitus with other circulatory complications: Secondary | ICD-10-CM

## 2023-05-04 NOTE — Telephone Encounter (Signed)
-----   Message from Alvina Chou sent at 05/01/2023  3:50 PM EDT ----- Regarding: Lab orders for Friday, 8.16.24 Patient is scheduled for CPX labs, please order future labs, Thanks , Camelia Eng

## 2023-05-15 ENCOUNTER — Other Ambulatory Visit: Payer: Medicare Other

## 2023-05-19 ENCOUNTER — Other Ambulatory Visit (INDEPENDENT_AMBULATORY_CARE_PROVIDER_SITE_OTHER): Payer: Medicare Other

## 2023-05-19 DIAGNOSIS — E1159 Type 2 diabetes mellitus with other circulatory complications: Secondary | ICD-10-CM | POA: Diagnosis not present

## 2023-05-19 DIAGNOSIS — E785 Hyperlipidemia, unspecified: Secondary | ICD-10-CM | POA: Diagnosis not present

## 2023-05-19 DIAGNOSIS — E1169 Type 2 diabetes mellitus with other specified complication: Secondary | ICD-10-CM

## 2023-05-19 DIAGNOSIS — M810 Age-related osteoporosis without current pathological fracture: Secondary | ICD-10-CM | POA: Diagnosis not present

## 2023-05-19 DIAGNOSIS — Z1231 Encounter for screening mammogram for malignant neoplasm of breast: Secondary | ICD-10-CM | POA: Diagnosis not present

## 2023-05-19 LAB — COMPREHENSIVE METABOLIC PANEL
ALT: 18 U/L (ref 0–35)
AST: 20 U/L (ref 0–37)
Albumin: 4.3 g/dL (ref 3.5–5.2)
Alkaline Phosphatase: 93 U/L (ref 39–117)
BUN: 21 mg/dL (ref 6–23)
CO2: 30 mEq/L (ref 19–32)
Calcium: 10.4 mg/dL (ref 8.4–10.5)
Chloride: 104 mEq/L (ref 96–112)
Creatinine, Ser: 0.75 mg/dL (ref 0.40–1.20)
GFR: 81.2 mL/min (ref >60.00)
Glucose, Bld: 117 mg/dL — ABNORMAL HIGH (ref 70–99)
Potassium: 4.4 mEq/L (ref 3.5–5.1)
Sodium: 142 mEq/L (ref 135–145)
Total Bilirubin: 0.4 mg/dL (ref 0.2–1.2)
Total Protein: 6.9 g/dL (ref 6.0–8.3)

## 2023-05-19 LAB — LIPID PANEL
Cholesterol: 124 mg/dL (ref 0–200)
HDL: 48.5 mg/dL (ref >39.00)
LDL Cholesterol: 61 mg/dL (ref 0–99)
NonHDL: 75.58
Total CHOL/HDL Ratio: 3
Triglycerides: 72 mg/dL (ref 0.0–149.0)
VLDL: 14.4 mg/dL (ref 0.0–40.0)

## 2023-05-19 LAB — HEMOGLOBIN A1C: Hgb A1c MFr Bld: 7 % — ABNORMAL HIGH (ref 4.6–6.5)

## 2023-05-19 LAB — VITAMIN D 25 HYDROXY (VIT D DEFICIENCY, FRACTURES): VITD: 28.83 ng/mL — ABNORMAL LOW (ref 30.00–100.00)

## 2023-05-19 LAB — HM MAMMOGRAPHY

## 2023-05-19 NOTE — Progress Notes (Signed)
No critical labs need to be addressed urgently. We will discuss labs in detail at upcoming office visit.   

## 2023-05-22 ENCOUNTER — Encounter: Payer: Self-pay | Admitting: Family Medicine

## 2023-05-22 ENCOUNTER — Ambulatory Visit (INDEPENDENT_AMBULATORY_CARE_PROVIDER_SITE_OTHER): Payer: Medicare Other | Admitting: Family Medicine

## 2023-05-22 VITALS — BP 120/74 | HR 87 | Temp 98.0°F | Ht 61.0 in | Wt 145.4 lb

## 2023-05-22 DIAGNOSIS — Z0000 Encounter for general adult medical examination without abnormal findings: Secondary | ICD-10-CM | POA: Diagnosis not present

## 2023-05-22 DIAGNOSIS — M810 Age-related osteoporosis without current pathological fracture: Secondary | ICD-10-CM | POA: Diagnosis not present

## 2023-05-22 DIAGNOSIS — E1169 Type 2 diabetes mellitus with other specified complication: Secondary | ICD-10-CM

## 2023-05-22 DIAGNOSIS — E785 Hyperlipidemia, unspecified: Secondary | ICD-10-CM

## 2023-05-22 DIAGNOSIS — M797 Fibromyalgia: Secondary | ICD-10-CM | POA: Diagnosis not present

## 2023-05-22 DIAGNOSIS — F332 Major depressive disorder, recurrent severe without psychotic features: Secondary | ICD-10-CM | POA: Diagnosis not present

## 2023-05-22 DIAGNOSIS — R21 Rash and other nonspecific skin eruption: Secondary | ICD-10-CM | POA: Diagnosis not present

## 2023-05-22 DIAGNOSIS — I152 Hypertension secondary to endocrine disorders: Secondary | ICD-10-CM

## 2023-05-22 DIAGNOSIS — E1159 Type 2 diabetes mellitus with other circulatory complications: Secondary | ICD-10-CM | POA: Diagnosis not present

## 2023-05-22 DIAGNOSIS — E559 Vitamin D deficiency, unspecified: Secondary | ICD-10-CM | POA: Insufficient documentation

## 2023-05-22 LAB — HM DIABETES FOOT EXAM

## 2023-05-22 MED ORDER — IBUPROFEN 800 MG PO TABS: 800.0000 mg | ORAL_TABLET | Freq: Three times a day (TID) | ORAL | 0 refills | Status: DC | PRN

## 2023-05-22 MED ORDER — VITAMIN D (ERGOCALCIFEROL) 1.25 MG (50000 UNIT) PO CAPS: 50000.0000 [IU] | ORAL_CAPSULE | ORAL | 0 refills | Status: AC

## 2023-05-22 NOTE — Assessment & Plan Note (Signed)
Stable, chronic.  Continue current medication.   Atorvastatin 40 mg p.o. daily 

## 2023-05-22 NOTE — Assessment & Plan Note (Signed)
Chronic, at goal in office today on no medication.

## 2023-05-22 NOTE — Assessment & Plan Note (Signed)
Replete with 12 week course.. could be contributing to mood and fibro flare.

## 2023-05-22 NOTE — Assessment & Plan Note (Addendum)
Chronic, severe inadequate control.  Encouraged her to follow up given poor control.  Followed by Dr. Evelene Croon.

## 2023-05-22 NOTE — Progress Notes (Signed)
Patient ID: Lisa Odom, female    DOB: 10-09-1955, 67 y.o.   MRN: [id number]  This visit was conducted in person.  BP 120/74 (BP Location: Right Arm, Patient Position: Sitting, Cuff Size: Large)   Pulse 87   Temp 98 F (36.7 C) (Temporal)   Ht 5\' 1"  (1.549 m)   Wt 145 lb 6 oz (65.9 kg)   SpO2 96%   BMI 27.47 kg/m    CC:  Chief Complaint  Patient presents with   Medicare Wellness    Subjective:   HPI: Lisa Odom is a 67 y.o. female presenting on 05/22/2023 for Medicare Wellness  The patient presents for annual medicare wellness, complete physical and review of chronic health problems. He/She also has the following acute concerns today: none   I have personally reviewed the Medicare Annual Wellness questionnaire and have noted 1. The patient's medical and social history 2. Their use of alcohol, tobacco or illicit drugs 3. Their current medications and supplements 4. The patient's functional ability including ADL's, fall risks, home safety risks and hearing or visual             impairment. 5. Diet and physical activities 6. Evidence for depression or mood disorders 7.         Updated provider list Cognitive evaluation was performed and recorded on pt medicare questionnaire form. The patients weight, height, BMI and visual acuity have been recorded in the chart   I have made referrals, counseling and provided education to the patient based review of the above and I have provided the pt with a written personalized care plan for preventive services.    Documentation of this information was scanned into the electronic record under the media tab.  Hearing Screening  Method: Audiometry   500Hz  1000Hz  2000Hz  4000Hz   Right ear 20 20 20 20   Left ear 20 20 20 20   Vision Screening - Comments:: Wears Glasses-Eye Exam 11/26/2022 at [facility]    One falls in last 12 months... tripped.   Advance directives and end of life planning reviewed in detail with  patient and documented in EMR. Patient given handout on advance care directives if needed. HCPOA and living will updated if needed.  Flowsheet Row Office Visit from 11/26/2022 in [facility]  PHQ-2 Total Score 2      Diabetes:  Diet controlled, slightly worse this time... has been eating a lot of   crackers.. some nausea. Lab Results  Component Value Date   HGBA1C 7.0 (H) 05/19/2023  Using medications without difficulties: Hypoglycemic episodes: Hyperglycemic episodes: Feet problems: no ulcers Blood Sugars averaging: 117-125 eye exam within last year: yes  Elevated Cholesterol:well controlled on atorvastatin 40 mg daily Lab Results  Component Value Date   CHOL 124 05/19/2023   HDL 48.50 05/19/2023   LDLCALC 61 05/19/2023   TRIG 72.0 05/19/2023   CHOLHDL 3 05/19/2023  Using medications without problems: none Muscle aches:  none Diet compliance: Exercise: minimal Other complaints:  Hypertension:   Not on a medication.. she is using Adderall during the day for  focus. BP Readings from Last 3 Encounters:  05/22/23 120/74  12/19/22 (!) 140/80  11/26/22 (!) 162/80  Using medication without problems or lightheadedness:  none Chest pain with exertion: none Edema:none Short of breath:none Average home BPs: 130/80 Other issues:  Chronic insomnia Slee[ing better at night.  Some issue with restless leg.     Moderate control depression on wellbutrin  XL  150 mg and cymbalta 60 mg daily. Followed by Dr. Evelene Croon.  She has had more issues with motivation lately.   Fibromyalgia flaring up with recent worsening mood. Meloxicam does not help. Ibuprofen 800  Relevant past medical, surgical, family and social history reviewed and updated as indicated. Interim medical history since our last visit reviewed. Allergies and medications reviewed and updated. Outpatient Medications Prior to Visit  Medication Sig Dispense Refill   ACCU-CHEK GUIDE test strip       Accu-Chek Softclix Lancets lancets CHECK BLOOD SUGAR 3 TIMES DAILY 300 each 2   alendronate (FOSAMAX) 70 MG tablet TAKE 1 TABLET BY MOUTH WEEKLY  WITH 8 OZ OF PLAIN WATER 30  MINUTES BEFORE FIRST FOOD, DRINK OR MEDS. STAY UPRIGHT FOR 30  MINS 12 tablet 3   ALPRAZolam (XANAX) 0.5 MG tablet Take 0.5 mg by mouth 3 (three) times daily as needed for anxiety.      amphetamine-dextroamphetamine (ADDERALL) 7.5 MG tablet Take 7.5 mg by mouth 2 (two) times daily. Morning and after lunch     atorvastatin (LIPITOR) 40 MG tablet TAKE 1 TABLET BY MOUTH DAILY 100 tablet 1   Blood Glucose Calibration (ACCU-CHEK GUIDE CONTROL) LIQD 1 each by In Vitro route daily as needed. 1 each 3   Blood Glucose Monitoring Suppl (ACCU-CHEK GUIDE ME) w/Device KIT USE IN THE MORNING , AT NOON,  AND AT BEDTIME 1 kit 0   buPROPion (WELLBUTRIN XL) 150 MG 24 hr tablet TAKE 1 TABLET BY MOUTH  DAILY 90 tablet 2   CALCIUM-VITAMIN D PO Take 600 mg by mouth 2 (two) times a day.      diclofenac Sodium (VOLTAREN) 1 % GEL Apply 2 g topically 4 (four) times daily as needed.     DULoxetine (CYMBALTA) 60 MG capsule TAKE 2 CAPSULES BY MOUTH  DAILY 180 capsule 2   gabapentin (NEURONTIN) 600 MG tablet TAKE ONE-HALF TABLET BY MOUTH IN THE MORNING AND AFTER LUNCH THEN TAKE 2 TABLETS BY MOUTH AT  BEDTIME 300 tablet 2   montelukast (SINGULAIR) 10 MG tablet Take 1 tablet (10 mg total) by mouth at bedtime. 30 tablet 11   Multiple Vitamin (MULTIVITAMIN) tablet Take 1 tablet by mouth daily.     Omega-3 Fatty Acids (FISH OIL) 1200 MG CAPS Take 1,200 mg by mouth daily.      pantoprazole (PROTONIX) 40 MG tablet TAKE 1 TABLET BY MOUTH DAILY 100 tablet 1   traZODone (DESYREL) 50 MG tablet TAKE 1/2 TO 1 TABLET BY MOUTH AT BEDTIME AS NEEDED FOR SLEEP 100 tablet 1   meloxicam (MOBIC) 15 MG tablet Take 15 mg by mouth daily.     amoxicillin (AMOXIL) 500 MG capsule Take 2 capsules (1,000 mg total) by mouth 2 (two) times daily. 40 capsule 0   No  facility-administered medications prior to visit.     Per HPI unless specifically indicated in ROS section below Review of Systems  Constitutional:  Negative for fatigue, fever and unexpected weight change.  HENT:  Negative for congestion, ear pain, sinus pressure, sneezing, sore throat and trouble swallowing.   Eyes:  Negative for pain and itching.  Respiratory:  Negative for cough, shortness of breath and wheezing.   Cardiovascular:  Negative for chest pain, palpitations and leg swelling.  Gastrointestinal:  Negative for abdominal pain, blood in stool, constipation, diarrhea and nausea.  Genitourinary:  Negative for difficulty urinating, dysuria, hematuria, menstrual problem and vaginal discharge.  Skin:  Negative for rash.  Neurological:  Negative for syncope, weakness, light-headedness, numbness and headaches.  Psychiatric/Behavioral:  Negative for confusion and dysphoric mood. The patient is not nervous/anxious.    Objective:  BP 120/74 (BP Location: Right Arm, Patient Position: Sitting, Cuff Size: Large)   Pulse 87   Temp 98 F (36.7 C) (Temporal)   Ht 5\' 1"  (1.549 m)   Wt 145 lb 6 oz (65.9 kg)   SpO2 96%   BMI 27.47 kg/m   Wt Readings from Last 3 Encounters:  05/22/23 145 lb 6 oz (65.9 kg)  12/19/22 152 lb 8 oz (69.2 kg)  11/26/22 152 lb (68.9 kg)      Physical Exam Vitals and nursing note reviewed.  Constitutional:      General: She is not in acute distress.    Appearance: Normal appearance. She is well-developed. She is not ill-appearing or toxic-appearing.  HENT:     Head: Normocephalic.     Right Ear: Hearing, tympanic membrane, ear canal and external ear normal.     Left Ear: Hearing, tympanic membrane, ear canal and external ear normal.     Nose: Nose normal.  Eyes:     General: Lids are normal. Lids are everted, no foreign bodies appreciated.     Conjunctiva/sclera: Conjunctivae normal.     Pupils: Pupils are equal, round, and reactive to light.  Neck:      Thyroid: No thyroid mass or thyromegaly.     Vascular: No carotid bruit.     Trachea: Trachea normal.  Cardiovascular:     Rate and Rhythm: Normal rate and regular rhythm.     Heart sounds: Normal heart sounds, S1 normal and S2 normal. No murmur heard.    No gallop.  Pulmonary:     Effort: Pulmonary effort is normal. No respiratory distress.     Breath sounds: Normal breath sounds. No wheezing, rhonchi or rales.  Abdominal:     General: Bowel sounds are normal. There is no distension or abdominal bruit.     Palpations: Abdomen is soft. There is no fluid wave or mass.     Tenderness: There is no abdominal tenderness. There is no guarding or rebound.     Hernia: No hernia is present.  Genitourinary:    Labia:        Right: Lesion present.        Left: Lesion present.   Musculoskeletal:     Cervical back: Normal range of motion and neck supple.  Lymphadenopathy:     Cervical: No cervical adenopathy.  Skin:    General: Skin is warm and dry.     Findings: No rash.  Neurological:     Mental Status: She is alert.     Cranial Nerves: No cranial nerve deficit.     Sensory: No sensory deficit.  Psychiatric:        Mood and Affect: Mood is not anxious or depressed.        Speech: Speech normal.        Behavior: Behavior normal. Behavior is cooperative.        Judgment: Judgment normal.    Diabetic foot exam: Normal inspection No skin breakdown No calluses  Normal DP pulses Normal sensation to light touch and monofilament Nails normal     Results for orders placed or performed in visit on 05/22/23  HM DIABETES FOOT EXAM  Result Value Ref Range   HM Diabetic Foot Exam done      COVID 19 screen:  No recent travel or known  exposure to COVID19 The patient denies respiratory symptoms of COVID 19 at this time. The importance of social distancing was discussed today.   Assessment and Plan   The patient's preventative maintenance and recommended screening tests for an annual  wellness exam were reviewed in full today. Brought up to date unless services declined.  Counselled on the importance of diet, exercise, and its role in overall health and mortality. The patient's FH and SH was reviewed, including their home life, tobacco status, and drug and alcohol status.   Vaccine:  Due for  td,  uptodate PCV23, shingrix.  PAP 2016 nml pap, neg HPV. Asymptomatic. Mammogram every 1-2 years. Done 04/2023 COLON:  2019 Dr. Lavon Paganini repeat in 10 years.  Hep C done  DEXA 12/2020  improved from osteoporosis to osteopenia on fosamax, due for repeat.. ordered  Problem List Items Addressed This Visit     Fibromyalgia     Meloxicam not helping.. will stop. Use ibuprofen 800 mg on full stomach sparingly for pain as needed.      Relevant Medications   ibuprofen (ADVIL) 800 MG tablet   Hyperlipidemia associated with type 2 diabetes mellitus (HCC) (Chronic)    Stable, chronic.  Continue current medication.   Atorvastatin 40 mg p.o. daily      Hypertension associated with diabetes (HCC) (Chronic)    Chronic, at goal in office today on no medication.      Major depressive disorder, recurrent episode, severe (HCC) (Chronic)    Chronic, severe inadequate control.  Encouraged her to follow up given poor control.  Followed by Dr. Evelene Croon.       Osteoporosis   Relevant Medications   Vitamin D, Ergocalciferol, (DRISDOL) 1.25 MG (50000 UNIT) CAPS capsule   Other Relevant Orders   DG Bone Density   Perineal rash     Perineal skin lesions appear most consistent with irritated skin tags, less likely genital warts.  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      Vitamin D deficiency     Replete  with 12 week course.. could be contributing to mood and fibro flare.      Other Visit Diagnoses     Medicare annual wellness visit, subsequent    -  Primary        Kerby Nora, MD

## 2023-05-22 NOTE — Assessment & Plan Note (Signed)
  Perineal skin lesions appear most consistent with irritated skin tags, less likely genital warts.  Will  refer to gynecology given these bother the patient and she would like to have them removed/treated

## 2023-05-22 NOTE — Assessment & Plan Note (Signed)
Meloxicam not helping.. will stop. Use ibuprofen 800 mg on full stomach sparingly for pain as needed.

## 2023-05-22 NOTE — Assessment & Plan Note (Signed)
Chronic,  stable control A1c at goal with improved lifestyle changes.

## 2023-05-22 NOTE — Patient Instructions (Signed)
Stop meloxicam.

## 2023-06-11 ENCOUNTER — Other Ambulatory Visit: Payer: Self-pay | Admitting: Family Medicine

## 2023-06-11 NOTE — Telephone Encounter (Signed)
Last office visit 05/22/2023 for MWV.  Last refilled 05/22/2023 fr #90 with no refills.  Next Appt: 11/24/23 for DM.

## 2023-06-19 ENCOUNTER — Telehealth (INDEPENDENT_AMBULATORY_CARE_PROVIDER_SITE_OTHER): Payer: Medicare Other | Admitting: Nurse Practitioner

## 2023-06-19 ENCOUNTER — Telehealth: Payer: Self-pay

## 2023-06-19 ENCOUNTER — Encounter: Payer: Self-pay | Admitting: Nurse Practitioner

## 2023-06-19 VITALS — Ht 61.0 in | Wt 145.3 lb

## 2023-06-19 DIAGNOSIS — U071 COVID-19: Secondary | ICD-10-CM | POA: Diagnosis not present

## 2023-06-19 MED ORDER — MOLNUPIRAVIR EUA 200MG CAPSULE
4.0000 | ORAL_CAPSULE | Freq: Two times a day (BID) | ORAL | 0 refills | Status: AC
Start: 2023-06-19 — End: 2023-06-24

## 2023-06-19 NOTE — Progress Notes (Signed)
MyChart Video Visit    Virtual Visit via Video Note   This visit type was conducted because this format is felt to be most appropriate for this patient at this time. Physical exam was limited by quality of the video and audio technology used for the visit. CMA was able to get the patient set up on a video visit.  Patient location: Home. Patient and provider in visit Provider location: Office  I discussed the limitations of evaluation and management by telemedicine and the availability of in person appointments. The patient expressed understanding and agreed to proceed.  Visit Date: 06/19/2023  Today's healthcare provider: Bethanie Dicker, NP     Subjective:    Patient ID: Lisa Odom, female    DOB: Aug 28, 1956, 67 y.o.   MRN: [id number]  Chief Complaint  Patient presents with   Covid Positive    Tested this morning 1am  and was positive nose runny and a cough sx's started Wednesday night.     HPI Patient with symptoms that started Wednesday. She had a positive COVID test at home early this morning. She is interested in treatment with Molnupiravir, as she has taken it in the past when she had COVID and tolerated it well.  Respiratory illness:  Cough- Yes  Congestion-    Sinus- Yes, nasal   Chest- No  Post nasal drip- Yes  Sore throat- Irritated   Shortness of breath- No  Fever- No  Fatigue/Myalgia- No Headache- No Nausea/Vomiting- No Taste disturbance- No  Smell disturbance- No  Covid exposure- No  Covid vaccination- x 5  Flu vaccination- UTD  Medications- None  Past Medical History:  Diagnosis Date   Anxiety    Arthritis    Contact lens/glasses fitting    wears contacts or glasses   Depression    Diabetes mellitus without complication (HCC)    No meds   Diverticulosis    Family history of adverse reaction to anesthesia    pts sister has severe N/V   Fibromyalgia    Gastritis    GERD (gastroesophageal reflux disease)    Hemorrhoid    Hiatal  hernia    neuropathy   Migraine    Neuropathy    Panic attacks    Pneumonia    as an infant    Past Surgical History:  Procedure Laterality Date   ANTERIOR CERVICAL DECOMP/DISCECTOMY FUSION N/A 09/12/2014   Procedure: Evacuation of cervical hematoma;  Surgeon: Temple Pacini, MD;  Location: [facility];  Service: Neurosurgery;  Laterality: N/A;   ANTERIOR CERVICAL DECOMP/DISCECTOMY FUSION N/A 09/12/2014   Procedure: CERVICAL FOUR-FIVE, CERVICAL FIVE-SIX, CERVICAL SIX-SEVEN ANTERIOR CERVICAL DECOMPRESSION/DISCECTOMY FUSION;  Surgeon: Temple Pacini, MD;  Location: [facility];  Service: Neurosurgery;  Laterality: N/A;   BUNIONECTOMY     CARPAL TUNNEL RELEASE  2002   right   COLONOSCOPY     JOINT REPLACEMENT Left    SHOULDER ARTHROSCOPY WITH ROTATOR CUFF REPAIR  2010   right   TOTAL HIP ARTHROPLASTY Left 11/20/2017   Procedure: LEFT TOTAL HIP ARTHROPLASTY ANTERIOR APPROACH WITH BONE GRAFTING AND ACETABULAR SCREWS;  Surgeon: Jodi Geralds, MD;  Location: [facility];  Service: Orthopedics;  Laterality: Left;   TOTAL KNEE ARTHROPLASTY Right 03/11/2019   Procedure: TOTAL KNEE ARTHROPLASTY;  Surgeon: Jodi Geralds, MD;  Location: [facility];  Service: Orthopedics;  Laterality: Right;   TRIGGER FINGER RELEASE Right 03/23/2013   Procedure: RELEASE TRIGGER FINGER/A-1 PULLEY RIGHT RING FINGER;  Surgeon: Nicki Reaper,  MD;  Location: [facility];  Service: Orthopedics;  Laterality: Right;   TUBAL LIGATION     UPPER GI ENDOSCOPY      Family History  Problem Relation Age of Onset   Diabetes Sister    Anxiety disorder Sister    Heart disease Brother    Ovarian cancer Cousin    Breast cancer Maternal Grandmother        grandmother   Diabetes Maternal Aunt        aunt   Alcohol abuse Maternal Uncle        uncle   Diabetes Paternal Aunt    Alcohol abuse Paternal Uncle     Social History   Socioeconomic History   Marital status: Divorced    Spouse name: Not on file   Number of  children: 1   Years of education: Not on file   Highest education level: Some college, no degree  Occupational History    Employer: [employer]  Tobacco Use   Smoking status: Never   Smokeless tobacco: Never  Vaping Use   Vaping status: Never Used  Substance and Sexual Activity   Alcohol use: No    Alcohol/week: 0.0 standard drinks of alcohol   Drug use: No   Sexual activity: Yes  Other Topics Concern   Not on file  Social History Narrative   Lives alone in a one story home.  Has one child.     Currently not working.     Worked for [employer].     Social Determinants of Health   Financial Resource Strain: Low Risk  (12/18/2022)   Overall Financial Resource Strain (CARDIA)    Difficulty of Paying Living Expenses: Not very hard  Food Insecurity: Unknown (12/18/2022)   Hunger Vital Sign    Worried About Running Out of Food in the Last Year: Never true    Ran Out of Food in the Last Year: Not on file  Transportation Needs: No Transportation Needs (12/18/2022)   PRAPARE - Administrator, Civil Service (Medical): No    Lack of Transportation (Non-Medical): No  Physical Activity: Unknown (12/18/2022)   Exercise Vital Sign    Days of Exercise per Week: Patient declined    Minutes of Exercise per Session: Not on file  Stress: No Stress Concern Present (12/18/2022)   [employer] of Occupational Health - Occupational Stress Questionnaire    Feeling of Stress : Only a little  Social Connections: Unknown (12/18/2022)   Social Connection and Isolation Panel [NHANES]    Frequency of Communication with Friends and Family: Three times a week    Frequency of Social Gatherings with Friends and Family: Patient declined    Attends Religious Services: Patient declined    Active Member of Clubs or Organizations: No    Attends Engineer, structural: Not on file    Marital Status: Divorced  Intimate Partner Violence: Not At Risk (12/11/2020)   Humiliation, Afraid, Rape, and Kick  questionnaire    Fear of Current or Ex-Partner: No    Emotionally Abused: No    Physically Abused: No    Sexually Abused: No    Outpatient Medications Prior to Visit  Medication Sig Dispense Refill   ACCU-CHEK GUIDE test strip      Accu-Chek Softclix Lancets lancets CHECK BLOOD SUGAR 3 TIMES DAILY 300 each 2   alendronate (FOSAMAX) 70 MG tablet TAKE 1 TABLET BY MOUTH WEEKLY  WITH 8 OZ OF PLAIN WATER 30  MINUTES  BEFORE FIRST FOOD, DRINK OR MEDS. STAY UPRIGHT FOR 30  MINS 12 tablet 3   ALPRAZolam (XANAX) 0.5 MG tablet Take 0.5 mg by mouth 3 (three) times daily as needed for anxiety.      amphetamine-dextroamphetamine (ADDERALL) 7.5 MG tablet Take 7.5 mg by mouth 2 (two) times daily. Morning and after lunch     atorvastatin (LIPITOR) 40 MG tablet TAKE 1 TABLET BY MOUTH DAILY 100 tablet 1   Blood Glucose Calibration (ACCU-CHEK GUIDE CONTROL) LIQD 1 each by In Vitro route daily as needed. 1 each 3   Blood Glucose Monitoring Suppl (ACCU-CHEK GUIDE ME) w/Device KIT USE IN THE MORNING , AT NOON,  AND AT BEDTIME 1 kit 0   buPROPion (WELLBUTRIN XL) 150 MG 24 hr tablet TAKE 1 TABLET BY MOUTH  DAILY 90 tablet 2   CALCIUM-VITAMIN D PO Take 600 mg by mouth 2 (two) times a day.      diclofenac Sodium (VOLTAREN) 1 % GEL Apply 2 g topically 4 (four) times daily as needed.     DULoxetine (CYMBALTA) 60 MG capsule TAKE 2 CAPSULES BY MOUTH  DAILY 180 capsule 2   gabapentin (NEURONTIN) 600 MG tablet TAKE ONE-HALF TABLET BY MOUTH IN THE MORNING AND AFTER LUNCH THEN TAKE 2 TABLETS BY MOUTH AT  BEDTIME 300 tablet 2   ibuprofen (ADVIL) 800 MG tablet TAKE 1 TABLET BY MOUTH EVERY 8  HOURS AS NEEDED 90 tablet 11   montelukast (SINGULAIR) 10 MG tablet Take 1 tablet (10 mg total) by mouth at bedtime. 30 tablet 11   Multiple Vitamin (MULTIVITAMIN) tablet Take 1 tablet by mouth daily.     Omega-3 Fatty Acids (FISH OIL) 1200 MG CAPS Take 1,200 mg by mouth daily.      pantoprazole (PROTONIX) 40 MG tablet TAKE 1 TABLET BY  MOUTH DAILY 100 tablet 1   traZODone (DESYREL) 50 MG tablet TAKE 1/2 TO 1 TABLET BY MOUTH AT BEDTIME AS NEEDED FOR SLEEP 100 tablet 1   Vitamin D, Ergocalciferol, (DRISDOL) 1.25 MG (50000 UNIT) CAPS capsule Take 1 capsule (50,000 Units total) by mouth every 7 (seven) days. 12 capsule 0   No facility-administered medications prior to visit.    Allergies  Allergen Reactions   Hydrochlorothiazide Rash   Sulfonamide Derivatives Rash    ROS  See HPI    Objective:    Physical Exam  Ht 5\' 1"  (1.549 m)   Wt 145 lb 4.8 oz (65.9 kg)   BMI 27.45 kg/m  Wt Readings from Last 3 Encounters:  06/19/23 145 lb 4.8 oz (65.9 kg)  05/22/23 145 lb 6 oz (65.9 kg)  12/19/22 152 lb 8 oz (69.2 kg)   GENERAL: alert, oriented, appears well and in no acute distress   HEENT: atraumatic, conjunttiva clear, no obvious abnormalities on inspection of external nose and ears   NECK: normal movements of the head and neck   LUNGS: on inspection no signs of respiratory distress, breathing rate appears normal, no obvious gross SOB, gasping or wheezing   CV: no obvious cyanosis   MS: moves all visible extremities without noticeable abnormality   PSYCH/NEURO: pleasant and cooperative, no obvious depression or anxiety, speech and thought processing grossly intact    Assessment & Plan:   Problem List Items Addressed This Visit       Other   Positive self-administered antigen test for COVID-19 - Primary    Will treat with Molnupiravir. Counseled on common side effects. Patient tolerated well in past. She  can use OTC Mucinex and nasal spray as needed to help with congestion. Encouraged adequate fluid intake. Counseled on quarantine protocol. Return precautions given to patient.       Relevant Medications   molnupiravir EUA (LAGEVRIO) 200 mg CAPS capsule    I am having Lisa Odom start on molnupiravir EUA. I am also having her maintain her multivitamin, CALCIUM-VITAMIN D PO, Fish Oil, ALPRAZolam,  DULoxetine, buPROPion, Accu-Chek Guide Control, amphetamine-dextroamphetamine, diclofenac Sodium, Accu-Chek Guide Me, gabapentin, montelukast, alendronate, Accu-Chek Softclix Lancets, pantoprazole, atorvastatin, traZODone, Accu-Chek Guide, Vitamin D (Ergocalciferol), and ibuprofen.  Meds ordered this encounter  Medications   molnupiravir EUA (LAGEVRIO) 200 mg CAPS capsule    Sig: Take 4 capsules (800 mg total) by mouth 2 (two) times daily for 5 days.    Dispense:  40 capsule    Refill:  0    Order Specific Question:   Supervising Provider    Answer:   Birdie Sons, ERIC G [[id number]]    I discussed the assessment and treatment plan with the patient. The patient was provided an opportunity to ask questions and all were answered. The patient agreed with the plan and demonstrated an understanding of the instructions.   The patient was advised to call back or seek an in-person evaluation if the symptoms worsen or if the condition fails to improve as anticipated.   Bethanie Dicker, NP [facility] at [facility] [phone] (phone) [phone] (fax)  [facility]

## 2023-06-19 NOTE — Telephone Encounter (Signed)
Called pt a 2nd time did not leave a 2nd  vm

## 2023-06-19 NOTE — Telephone Encounter (Signed)
LMOM for pt to CB in regards to her virtual visit with Bethanie Dicker, Np this evening to see if she would like to be seen early/ get her appt started

## 2023-06-19 NOTE — Telephone Encounter (Signed)
3rd attempt to reach pt 2nd detailed Vm left informing pt that her visit begins in 15 minutes and informed her of our 10 minute grace period.

## 2023-06-19 NOTE — Assessment & Plan Note (Signed)
Will treat with Molnupiravir. Counseled on common side effects. Patient tolerated well in past. She can use OTC Mucinex and nasal spray as needed to help with congestion. Encouraged adequate fluid intake. Counseled on quarantine protocol. Return precautions given to patient.

## 2023-06-21 ENCOUNTER — Other Ambulatory Visit: Payer: Self-pay | Admitting: Family Medicine

## 2023-06-23 DIAGNOSIS — M8588 Other specified disorders of bone density and structure, other site: Secondary | ICD-10-CM | POA: Diagnosis not present

## 2023-06-23 LAB — HM DEXA SCAN

## 2023-06-24 ENCOUNTER — Encounter: Payer: Self-pay | Admitting: Family Medicine

## 2023-07-07 ENCOUNTER — Other Ambulatory Visit: Payer: Self-pay | Admitting: Family Medicine

## 2023-07-09 ENCOUNTER — Ambulatory Visit: Payer: Medicare Other | Admitting: Podiatry

## 2023-07-09 ENCOUNTER — Encounter: Payer: Self-pay | Admitting: Podiatry

## 2023-07-09 DIAGNOSIS — M19272 Secondary osteoarthritis, left ankle and foot: Secondary | ICD-10-CM | POA: Diagnosis not present

## 2023-07-09 NOTE — Progress Notes (Signed)
  Subjective:  Patient ID: Lisa Odom, female    DOB: [birth year],  MRN: [id number]  Chief Complaint  Patient presents with   Arthritis    Left foot midfoot pain for months. Throbbing, sometimes feels like it "catches" when she walks. Dorsal lateral midfoot, around 3rd,4th tmt    Discussed the use of AI scribe software for clinical note transcription with the patient, who gave verbal consent to proceed.  History of Present Illness   The patient, with a history of fibromyalgia and foot arthritis, presents with worsening left foot pain. The pain is localized to the arch and is described as so severe that she 'wants to scream.' The pain has been progressing distally. She also reports a lump in the area of pain, which she attributes to arthritis. The patient has been experiencing these symptoms for an unspecified duration. She reports that the pain was particularly severe the day before the current encounter.          Objective:    Physical Exam   MUSCULOSKELETAL: Warm and well-produced left foot with palpable pulses. Dorsal spurring on the second, third, and fourth metatarsal joints with pain to palpation. Neuritis radiating distally.       No images are attached to the encounter.    Results   Procedure: Corticosteroid injection Description: Injection to the dorsal third and fourth tarsometatarsal joints after sterile prep with Betadine, using 20 mg of Kenalog, 2 mg of dexamethasone, and 0.5 mL of 0.5% Marcaine plain. Informed Consent: Verbal consent obtained. Discussed risks, benefits, and alternatives, including potential relief duration from three to twelve months and surgical options if no improvement.  RADIOLOGY Left foot X-ray: Arthritis with bone spurs in the midfoot (03/2022)      Assessment:   1. Other secondary osteoarthritis of left foot      Plan:  Patient was evaluated and treated and all questions answered.  Assessment and Plan    Foot Arthritis    Noted severe pain and limited mobility due to arthritis in the midfoot with associated neuritis. She prefers conservative management. Administered a corticosteroid injection to the dorsal third and fourth tarsometatarsal joints, including 20mg  of Kenalog, 2mg  of dexamethasone, and 0.5cc of 0.5% Marcaine plain. Recommended appropriate shoe gear such as Hokas and ultra shoes with a rocker type sole and thick cushioning to reduce motion across the midfoot. She will return as needed for further injections.          Return if symptoms worsen or fail to improve.

## 2023-07-24 ENCOUNTER — Other Ambulatory Visit: Payer: Self-pay | Admitting: Family Medicine

## 2023-08-06 ENCOUNTER — Encounter (HOSPITAL_BASED_OUTPATIENT_CLINIC_OR_DEPARTMENT_OTHER): Payer: Self-pay

## 2023-08-06 ENCOUNTER — Encounter (HOSPITAL_BASED_OUTPATIENT_CLINIC_OR_DEPARTMENT_OTHER): Payer: Medicare Other | Admitting: Certified Nurse Midwife

## 2023-08-06 ENCOUNTER — Telehealth (HOSPITAL_BASED_OUTPATIENT_CLINIC_OR_DEPARTMENT_OTHER): Payer: Self-pay | Admitting: *Deleted

## 2023-08-06 NOTE — Telephone Encounter (Signed)
Called patient and left a message to call the office back to schedule her missed appointment .

## 2023-09-18 ENCOUNTER — Encounter: Payer: Self-pay | Admitting: Family Medicine

## 2023-09-18 DIAGNOSIS — G894 Chronic pain syndrome: Secondary | ICD-10-CM

## 2023-09-21 MED ORDER — PANTOPRAZOLE SODIUM 40 MG PO TBEC: 40.0000 mg | DELAYED_RELEASE_TABLET | Freq: Every day | ORAL | 1 refills | Status: DC

## 2023-09-21 MED ORDER — ATORVASTATIN CALCIUM 40 MG PO TABS: 40.0000 mg | ORAL_TABLET | Freq: Every day | ORAL | 1 refills | Status: DC

## 2023-09-25 ENCOUNTER — Emergency Department (HOSPITAL_COMMUNITY): Payer: Medicare Other

## 2023-09-25 ENCOUNTER — Emergency Department (HOSPITAL_COMMUNITY)
Admission: EM | Admit: 2023-09-25 | Discharge: 2023-09-26 | Disposition: A | Discharge status: Home / Self Care | Payer: Medicare Other | Attending: Emergency Medicine | Admitting: Emergency Medicine

## 2023-09-25 ENCOUNTER — Other Ambulatory Visit: Payer: Self-pay

## 2023-09-25 DIAGNOSIS — I10 Essential (primary) hypertension: Secondary | ICD-10-CM | POA: Insufficient documentation

## 2023-09-25 DIAGNOSIS — W19XXXA Unspecified fall, initial encounter: Secondary | ICD-10-CM | POA: Insufficient documentation

## 2023-09-25 DIAGNOSIS — S52101A Unspecified fracture of upper end of right radius, initial encounter for closed fracture: Secondary | ICD-10-CM | POA: Diagnosis not present

## 2023-09-25 DIAGNOSIS — S0181XA Laceration without foreign body of other part of head, initial encounter: Secondary | ICD-10-CM | POA: Insufficient documentation

## 2023-09-25 DIAGNOSIS — S0990XA Unspecified injury of head, initial encounter: Secondary | ICD-10-CM | POA: Diagnosis not present

## 2023-09-25 DIAGNOSIS — E041 Nontoxic single thyroid nodule: Secondary | ICD-10-CM | POA: Insufficient documentation

## 2023-09-25 DIAGNOSIS — Y92093 Driveway of other non-institutional residence as the place of occurrence of the external cause: Secondary | ICD-10-CM | POA: Diagnosis not present

## 2023-09-25 DIAGNOSIS — S52511A Displaced fracture of right radial styloid process, initial encounter for closed fracture: Secondary | ICD-10-CM | POA: Diagnosis not present

## 2023-09-25 DIAGNOSIS — S52501A Unspecified fracture of the lower end of right radius, initial encounter for closed fracture: Secondary | ICD-10-CM | POA: Diagnosis not present

## 2023-09-25 DIAGNOSIS — Z23 Encounter for immunization: Secondary | ICD-10-CM | POA: Diagnosis not present

## 2023-09-25 DIAGNOSIS — M19031 Primary osteoarthritis, right wrist: Secondary | ICD-10-CM | POA: Diagnosis not present

## 2023-09-25 DIAGNOSIS — S6992XA Unspecified injury of left wrist, hand and finger(s), initial encounter: Secondary | ICD-10-CM | POA: Diagnosis present

## 2023-09-25 DIAGNOSIS — E119 Type 2 diabetes mellitus without complications: Secondary | ICD-10-CM | POA: Diagnosis not present

## 2023-09-25 DIAGNOSIS — M7989 Other specified soft tissue disorders: Secondary | ICD-10-CM | POA: Insufficient documentation

## 2023-09-25 DIAGNOSIS — S199XXA Unspecified injury of neck, initial encounter: Secondary | ICD-10-CM | POA: Diagnosis not present

## 2023-09-25 DIAGNOSIS — M19041 Primary osteoarthritis, right hand: Secondary | ICD-10-CM | POA: Diagnosis not present

## 2023-09-25 NOTE — ED Provider Triage Note (Signed)
Emergency Medicine Provider Triage Evaluation Note  Lisa Odom , a 67 y.o. female  was evaluated in triage.  Pt complains of mechanical fall.  Review of Systems  Positive:  Negative:   Physical Exam  BP (!) 162/79 (BP Location: Right Arm)   Pulse 95   Temp 98.5 F (36.9 C) (Oral)   Resp 20   SpO2 100%  Gen:   Awake, no distress   Resp:  Normal effort  MSK:   Moves extremities without difficulty  Other:    Medical Decision Making  Medically screening exam initiated at 9:01 PM.  Appropriate orders placed.  Lisa Odom was informed that the remainder of the evaluation will be completed by another provider, this initial triage assessment does not replace that evaluation, and the importance of remaining in the ED until their evaluation is complete.  Mechanical fall in driveway today. Right hand pain. Also hit head and has headache. Denies LOC, seizures, blood thinners.    Dorthy Cooler, [place] 09/25/23 2102

## 2023-09-25 NOTE — ED Triage Notes (Addendum)
Patient tripped and fell at her driveway this evening , denies LOC , hit her head against the pavement , reports frontal headache and right hand pain . Alert and oriented /respirations unlabored . No anticoagulant medication .

## 2023-09-26 ENCOUNTER — Encounter (HOSPITAL_COMMUNITY): Payer: Self-pay

## 2023-09-26 DIAGNOSIS — S52511A Displaced fracture of right radial styloid process, initial encounter for closed fracture: Secondary | ICD-10-CM | POA: Diagnosis not present

## 2023-09-26 MED ORDER — HYDROCODONE-ACETAMINOPHEN 5-325 MG PO TABS: 1.0000 | ORAL_TABLET | Freq: Four times a day (QID) | ORAL | 0 refills | Status: DC | PRN

## 2023-09-26 MED ORDER — TETANUS-DIPHTH-ACELL PERTUSSIS 5-2.5-18.5 LF-MCG/0.5 IM SUSY
0.5000 mL | PREFILLED_SYRINGE | Freq: Once | INTRAMUSCULAR | Status: AC
Start: 2023-09-26 — End: 2023-09-26
  Administered 2023-09-26: 0.5 mL via INTRAMUSCULAR
  Filled 2023-09-26: qty 0.5

## 2023-09-26 MED ORDER — HYDROCODONE-ACETAMINOPHEN 5-325 MG PO TABS
1.0000 | ORAL_TABLET | Freq: Once | ORAL | Status: AC
  Administered 2023-09-26: 1 via ORAL
  Filled 2023-09-26: qty 1

## 2023-09-26 NOTE — ED Notes (Signed)
Ortho tech at bedside 

## 2023-09-26 NOTE — Progress Notes (Signed)
Orthopedic Tech Progress Note Patient Details:  Trenesha Spagnoli [facility] 1956-02-14 [id number]  Ortho Devices Type of Ortho Device: Ace wrap, Cotton web roll, Arm sling, Sugartong splint Ortho Device/Splint Location: RUE Ortho Device/Splint Interventions: Ordered, Application, Adjustment   Post Interventions Patient Tolerated: Well, Fair Instructions Provided: Care of device  Donald Pore 09/26/2023, 5:08 AM

## 2023-09-26 NOTE — ED Provider Notes (Signed)
[place] EMERGENCY DEPARTMENT AT [facility] Provider Note   CSN: [id number] Arrival date & time: 09/25/23  2011     History  Chief Complaint  Patient presents with   Lisa Odom is a 67 y.o. female who presents the emergency department after mechanical fall.  Patient states that she slipped getting out of the car while she was in her driveway.  She struck her head, and also braced her fall with her right hand.  Denies loss of conscious.  She is not on blood thinners.   Fall Associated symptoms include headaches.       Home Medications Prior to Admission medications   Medication Sig Start Date End Date Taking? Authorizing Provider  ACCU-CHEK GUIDE test strip USE TO CHECK BLOOD SUGAR 3 TIMES DAILY 07/27/23   Bedsole, Amy E, MD  Accu-Chek Softclix Lancets lancets CHECK BLOOD SUGAR 3 TIMES DAILY 03/10/23   Bedsole, Amy E, MD  alendronate (FOSAMAX) 70 MG tablet TAKE 1 TABLET BY MOUTH WEEKLY  WITH 8 OZ OF PLAIN WATER 30  MINUTES BEFORE FIRST FOOD, DRINK OR MEDS. STAY UPRIGHT FOR 30  MINS 03/10/23   Bedsole, Amy E, MD  ALPRAZolam Prudy Feeler) 0.5 MG tablet Take 0.5 mg by mouth 3 (three) times daily as needed for anxiety.     [provider]  amphetamine-dextroamphetamine (ADDERALL) 7.5 MG tablet Take 7.5 mg by mouth 2 (two) times daily. Morning and after lunch 03/22/20   [provider]  atorvastatin (LIPITOR) 40 MG tablet Take 1 tablet (40 mg total) by mouth daily. 09/21/23   Bedsole, Amy E, MD  Blood Glucose Calibration (ACCU-CHEK GUIDE CONTROL) LIQD 1 each by In Vitro route daily as needed. 12/23/19   Bedsole, Amy E, MD  Blood Glucose Monitoring Suppl (ACCU-CHEK GUIDE ME) w/Device KIT USE IN THE MORNING , AT NOON,  AND AT BEDTIME 06/11/22   Bedsole, Amy E, MD  buPROPion (WELLBUTRIN XL) 150 MG 24 hr tablet TAKE 1 TABLET BY MOUTH  DAILY 06/04/19   Bedsole, Amy E, MD  CALCIUM-VITAMIN D PO Take 600 mg by mouth 2 (two) times a day.     [provider]  diclofenac Sodium (VOLTAREN) 1 % GEL Apply 2 g topically 4 (four) times daily as needed.    [provider]  DULoxetine (CYMBALTA) 60 MG capsule TAKE 2 CAPSULES BY MOUTH  DAILY 06/04/19   Bedsole, Amy E, MD  gabapentin (NEURONTIN) 600 MG tablet TAKE ONE-HALF TABLET BY MOUTH IN THE MORNING AND AFTER LUNCH THEN TAKE 2 TABLETS BY MOUTH AT  BEDTIME 07/09/23   Bedsole, Amy E, MD  HYDROcodone-acetaminophen (NORCO/VICODIN) 5-325 MG tablet Take 1 tablet by mouth every 6 (six) hours as needed for severe pain (pain score 7-10). 09/26/23   Pradyun Ishman T, PA-C  ibuprofen (ADVIL) 800 MG tablet TAKE 1 TABLET BY MOUTH EVERY 8  HOURS AS NEEDED 06/11/23   Worthy Rancher B, FNP  montelukast (SINGULAIR) 10 MG tablet TAKE 1 TABLET BY MOUTH AT  BEDTIME 06/22/23   Bedsole, Amy E, MD  Multiple Vitamin (MULTIVITAMIN) tablet Take 1 tablet by mouth daily.    [provider]  Omega-3 Fatty Acids (FISH OIL) 1200 MG CAPS Take 1,200 mg by mouth daily.     [provider]  pantoprazole (PROTONIX) 40 MG tablet Take 1 tablet (40 mg total) by mouth daily. 09/21/23   Bedsole, Amy E, MD  traZODone (DESYREL) 50 MG tablet TAKE 1/2 TO 1 TABLET  BY MOUTH AT BEDTIME AS NEEDED FOR SLEEP 04/27/23   Bedsole, Amy E, MD  Vitamin D, Ergocalciferol, (DRISDOL) 1.25 MG (50000 UNIT) CAPS capsule Take 1 capsule (50,000 Units total) by mouth every 7 (seven) days. 05/22/23   Excell Seltzer, MD      Allergies    Hydrochlorothiazide and Sulfonamide derivatives    Review of Systems   Review of Systems  Musculoskeletal:  Positive for arthralgias and neck pain.  Neurological:  Positive for headaches.  All other systems reviewed and are negative.   Physical Exam Updated Vital Signs BP (!) 181/97 (BP Location: Left Arm)   Pulse 67   Temp 97.7 F (36.5 C) (Oral)   Resp 16   Ht 5\' 1"  (1.549 m)   Wt 65.8 kg   SpO2 95%   BMI 27.40 kg/m  Physical Exam Vitals and nursing note reviewed.  Constitutional:       Appearance: Normal appearance.  HENT:     Head: Normocephalic. No raccoon eyes or Battle's sign.     Comments: 0.5 cm laceration to the right forehead, bleeding is controlled Eyes:     General: Lids are normal.     Extraocular Movements: Extraocular movements intact.     Conjunctiva/sclera: Conjunctivae normal.     Pupils: Pupils are equal, round, and reactive to light.  Neck:     Comments: No midline spinal tenderness or deformities palpated Pulmonary:     Effort: Pulmonary effort is normal. No respiratory distress.  Musculoskeletal:     Comments: Right wrist in splint, normal range of motion of the digits, normal sensation, normal capillary refill  Skin:    General: Skin is warm and dry.  Neurological:     Mental Status: She is alert.  Psychiatric:        Mood and Affect: Mood normal.        Behavior: Behavior normal.     ED Results / Procedures / Treatments   Labs (all labs ordered are listed, but only abnormal results are displayed) Labs Reviewed - No data to display  EKG None  Radiology CT Head Wo Contrast Result Date: 09/25/2023 CLINICAL DATA:  Neck trauma (Age >= 65y); Head trauma, minor (Age >= 65y) EXAM: CT HEAD WITHOUT CONTRAST CT CERVICAL SPINE WITHOUT CONTRAST TECHNIQUE: Multidetector CT imaging of the head and cervical spine was performed following the standard protocol without intravenous contrast. Multiplanar CT image reconstructions of the cervical spine were also generated. RADIATION DOSE REDUCTION: This exam was performed according to the departmental dose-optimization program which includes automated exposure control, adjustment of the mA and/or kV according to patient size and/or use of iterative reconstruction technique. COMPARISON:  MRI head 04/18/2016, x-ray cervical spine 09/12/2014, x-ray cervical spine 01/09/2017 FINDINGS: CT HEAD FINDINGS Brain: No evidence of large-territorial acute infarction. No parenchymal hemorrhage. No mass lesion. No  extra-axial collection. No mass effect or midline shift. No hydrocephalus. Basilar cisterns are patent. Vascular: No hyperdense vessel. Skull: No acute fracture or focal lesion. Sinuses/Orbits: Paranasal sinuses and mastoid air cells are clear. The orbits are unremarkable. Other: None. CT CERVICAL SPINE FINDINGS Alignment: Grade 1 anterolisthesis of C2 on C3 and C3 on C4. Grade 1 anterolisthesis of C7 on T1. Skull base and vertebrae: C4 through C7 anterior cervical discectomy and fusion surgical hardware. Old healed C7 transverse process fractures. Multilevel moderate severe degenerative changes of the spine. No severe osseous neural foraminal or central canal stenosis. No acute fracture. No aggressive appearing focal osseous lesion or focal pathologic  process. Soft tissues and spinal canal: No prevertebral fluid or swelling. No visible canal hematoma. Upper chest: Unremarkable. Other: Degenerative changes of the shoulders. Emphysematous changes of the lungs. Prominent thyroid with question left thyroid gland hypodense lesion (4:86. IMPRESSION: 1.  No acute intracranial abnormality. 2. No acute displaced fracture or traumatic listhesis of the cervical spine. 3. Prominent thyroid with question left thyroid gland hypodense lesion. Recommend outpatient thyroid US (ref: J Am Coll Radiol. 2015 Feb;12(2): 143-50). 4. Degenerative changes and listhesis above in a patient with C4-C7 ACDF. Electronically Signed   By: Tish Frederickson M.D.   On: 09/25/2023 23:20   CT Cervical Spine Wo Contrast Result Date: 09/25/2023 CLINICAL DATA:  Neck trauma (Age >= 65y); Head trauma, minor (Age >= 65y) EXAM: CT HEAD WITHOUT CONTRAST CT CERVICAL SPINE WITHOUT CONTRAST TECHNIQUE: Multidetector CT imaging of the head and cervical spine was performed following the standard protocol without intravenous contrast. Multiplanar CT image reconstructions of the cervical spine were also generated. RADIATION DOSE REDUCTION: This exam was performed  according to the departmental dose-optimization program which includes automated exposure control, adjustment of the mA and/or kV according to patient size and/or use of iterative reconstruction technique. COMPARISON:  MRI head 04/18/2016, x-ray cervical spine 09/12/2014, x-ray cervical spine 01/09/2017 FINDINGS: CT HEAD FINDINGS Brain: No evidence of large-territorial acute infarction. No parenchymal hemorrhage. No mass lesion. No extra-axial collection. No mass effect or midline shift. No hydrocephalus. Basilar cisterns are patent. Vascular: No hyperdense vessel. Skull: No acute fracture or focal lesion. Sinuses/Orbits: Paranasal sinuses and mastoid air cells are clear. The orbits are unremarkable. Other: None. CT CERVICAL SPINE FINDINGS Alignment: Grade 1 anterolisthesis of C2 on C3 and C3 on C4. Grade 1 anterolisthesis of C7 on T1. Skull base and vertebrae: C4 through C7 anterior cervical discectomy and fusion surgical hardware. Old healed C7 transverse process fractures. Multilevel moderate severe degenerative changes of the spine. No severe osseous neural foraminal or central canal stenosis. No acute fracture. No aggressive appearing focal osseous lesion or focal pathologic process. Soft tissues and spinal canal: No prevertebral fluid or swelling. No visible canal hematoma. Upper chest: Unremarkable. Other: Degenerative changes of the shoulders. Emphysematous changes of the lungs. Prominent thyroid with question left thyroid gland hypodense lesion (4:86. IMPRESSION: 1.  No acute intracranial abnormality. 2. No acute displaced fracture or traumatic listhesis of the cervical spine. 3. Prominent thyroid with question left thyroid gland hypodense lesion. Recommend outpatient thyroid US (ref: J Am Coll Radiol. 2015 Feb;12(2): 143-50). 4. Degenerative changes and listhesis above in a patient with C4-C7 ACDF. Electronically Signed   By: Tish Frederickson M.D.   On: 09/25/2023 23:20   DG Hand Complete Right Result  Date: 09/25/2023 CLINICAL DATA:  Status post fall with right wrist and hand pain. EXAM: RIGHT HAND - COMPLETE 3+ VIEW; RIGHT WRIST - COMPLETE 3+ VIEW COMPARISON:  None Available. FINDINGS: Wrist: Mildly displaced distal radius fracture primarily involving the radial styloid. Fracture extends to the radiocarpal joint where there is 2 mm osseous distraction at the articular surface. Corticated density adjacent to the ulna styloid is chronic. No acute ulnar styloid fracture. Generalized soft tissue edema. Hand: No evidence of acute fracture of the hand. Advanced chronic arthropathy of the thumb carpal metacarpal joint with fragmented osteophytes. Moderate osteoarthritis of the distal interphalangeal joints of the digits. No erosive change. No dislocation. IMPRESSION: 1. Mildly displaced distal radius fracture primarily involving the radial styloid. Fracture extends to the radiocarpal joint where there is 2 mm osseous  distraction at the articular surface. 2. No acute fracture of the hand. 3. Advanced chronic arthropathy of the thumb carpometacarpal joint. Moderate osteoarthritis of the distal interphalangeal joints of the digits. Electronically Signed   By: Narda Rutherford M.D.   On: 09/25/2023 21:33   DG Wrist Complete Right Result Date: 09/25/2023 CLINICAL DATA:  Status post fall with right wrist and hand pain. EXAM: RIGHT HAND - COMPLETE 3+ VIEW; RIGHT WRIST - COMPLETE 3+ VIEW COMPARISON:  None Available. FINDINGS: Wrist: Mildly displaced distal radius fracture primarily involving the radial styloid. Fracture extends to the radiocarpal joint where there is 2 mm osseous distraction at the articular surface. Corticated density adjacent to the ulna styloid is chronic. No acute ulnar styloid fracture. Generalized soft tissue edema. Hand: No evidence of acute fracture of the hand. Advanced chronic arthropathy of the thumb carpal metacarpal joint with fragmented osteophytes. Moderate osteoarthritis of the distal  interphalangeal joints of the digits. No erosive change. No dislocation. IMPRESSION: 1. Mildly displaced distal radius fracture primarily involving the radial styloid. Fracture extends to the radiocarpal joint where there is 2 mm osseous distraction at the articular surface. 2. No acute fracture of the hand. 3. Advanced chronic arthropathy of the thumb carpometacarpal joint. Moderate osteoarthritis of the distal interphalangeal joints of the digits. Electronically Signed   By: Narda Rutherford M.D.   On: 09/25/2023 21:33    Procedures .Laceration Repair  Date/Time: 09/26/2023 5:14 AM  Performed by: Su Monks, PA-C Authorized by: Su Monks, PA-C   Consent:    Consent obtained:  Verbal   Consent given by:  Patient   Risks, benefits, and alternatives were discussed: yes     Risks discussed:  Infection, pain and poor cosmetic result Universal protocol:    Procedure explained and questions answered to patient or proxy's satisfaction: yes     Patient identity confirmed:  Provided demographic data Anesthesia:    Anesthesia method:  None Laceration details:    Location:  Face   Face location:  Forehead   Length (cm):  0.5 Pre-procedure details:    Preparation:  Imaging obtained to evaluate for foreign bodies Exploration:    Hemostasis achieved with:  Direct pressure   Imaging obtained comment:  CT   Imaging outcome: foreign body not noted     Wound exploration: entire depth of wound visualized   Treatment:    Area cleansed with:  Saline   Amount of cleaning:  Standard   Visualized foreign bodies/material removed: no     Debridement:  None   Undermining:  None   Scar revision: no   Skin repair:    Repair method:  Tissue adhesive Approximation:    Approximation:  Close Repair type:    Repair type:  Simple Post-procedure details:    Dressing:  Adhesive bandage   Procedure completion:  Tolerated well, no immediate complications .Ortho Injury  Treatment  Date/Time: 09/26/2023 5:15 AM  Performed by: Su Monks, PA-C Authorized by: Su Monks, PA-C   Consent:    Consent obtained:  Verbal   Consent given by:  Patient   Risks discussed:  Stiffness and restricted joint movement   Alternatives discussed:  No treatmentInjury location: wrist Location details: right wrist Injury type: fracture Fracture type: distal radius Pre-procedure neurovascular assessment: neurovascularly intact Pre-procedure distal perfusion: normal Pre-procedure neurological function: normal Pre-procedure range of motion: reduced  Anesthesia: Local anesthesia used: no  Patient sedated: NoManipulation performed: no Immobilization: splint Splint type: sugar tong and volar  short arm Splint Applied by: Ortho Tech Supplies used: Ortho-Glass and cotton padding Post-procedure neurovascular assessment: post-procedure neurovascularly intact Post-procedure distal perfusion: normal Post-procedure neurological function: normal Post-procedure range of motion: unchanged       Medications Ordered in ED Medications  HYDROcodone-acetaminophen (NORCO/VICODIN) 5-325 MG per tablet 1 tablet (has no administration in time range)  Tdap (BOOSTRIX) injection 0.5 mL (0.5 mLs Intramuscular Given 09/26/23 0456)    ED Course/ Medical Decision Making/ A&P                                 Medical Decision Making Risk Prescription drug management.  This patient is a 67 y.o. female  who presents to the ED for concern of fall, head injury, wrist injury.   Differential diagnoses prior to evaluation: The emergent differential diagnosis includes, but is not limited to, rupture, dislocation, ligamentous injury, intracranial bleeding. This is not an exhaustive differential.   Past Medical History / Co-morbidities / Social History: Hyperlipidemia, depression, hypertension, type 2 diabetes, migraines, GERD, fibromyalgia, osteoporosis, not on blood  thinners  Physical Exam: Physical exam performed. The pertinent findings include: Hypertensive, otherwise normal vital signs.  No acute distress.  0.5 cm laceration noted to the right forehead, bleeding is controlled.  Right wrist in splint, neurovascularly intact.  Lab Tests/Imaging studies: I personally interpreted labs/imaging and the pertinent results include: CT head and cervical spine without acute abnormalities, questionable thyroid nodule, radiology recommending outpatient ultrasound.  Hand and wrist x-ray shows mildly displaced distal radius fracture involving the radial styloid, extending to the radiocarpal joint. I agree with the radiologist interpretation.  Medications: I ordered medication including Norco, Tdap.  I have reviewed the patients home medicines and have made adjustments as needed.  Procedures: Laceration cleaned with saline and repaired with Dermabond.  Ortho tech placed patient in splint, specifically volar short arm with sugar-tong   Disposition: After consideration of the diagnostic results and the patients response to treatment, I feel that emergency department workup does not suggest an emergent condition requiring admission or immediate intervention beyond what has been performed at this time. The plan is: Discharge to home.  Patient placed in splint and given prescription for pain medication for breakthrough pain.  Provided with hand/orthopedic follow-up.  Given instructions regarding her Dermabond suture repair.  Tdap updated.. The patient is safe for discharge and has been instructed to return immediately for worsening symptoms, change in symptoms or any other concerns.  Final Clinical Impression(s) / ED Diagnoses Final diagnoses:  Fall, initial encounter  Facial laceration, initial encounter  Closed displaced fracture of styloid process of right radius, initial encounter  Thyroid nodule    Rx / DC Orders ED Discharge Orders          Ordered     HYDROcodone-acetaminophen (NORCO/VICODIN) 5-325 MG tablet  Every 6 hours PRN        09/26/23 0520           Portions of this report may have been transcribed using voice recognition software. Every effort was made to ensure accuracy; however, inadvertent computerized transcription errors may be present.    Jeanella Flattery 09/26/23 Eulogio Ditch, MD 09/26/23 [phone]

## 2023-09-26 NOTE — Discharge Instructions (Addendum)
You were seen in the emergency department after a fall.  We were able to close your laceration with skin glue. The adhesive should peel off in about 5 to 10 days.  If it comes off sooner that is okay.  If it lasts longer than that, you can use some Vaseline to help it come off on its own.  With the glue, you may shower, but do not soak or scrub the area for 7 to 10 days.  Then make sure that you pat the area dry.   If you want to wear a bandage over the area that is fine as well, but make sure it is clean/dry (has no ointment on it).  Watch out for signs of infection like we discussed, including: increased redness, tenderness, or drainage of pus from the site. If this happens and you were not prescribed antibiotics, please seek medical attention.   We updated your tetanus vaccination.  Your x-ray showed that you fractured your wrist.  Specifically at the head of your radius.  We placed this in a splint.  I have also prescribed you some pain medication for home.  I would like you to follow-up with the orthopedist, and I have attached their contact information for you to call make a follow-up appointment.  Continue to monitor how you're doing and return to the ER for new or worsening symptoms.  Your scan also showed a prominent thyroid gland with a possible nodule.  Please discuss this with your primary doctor as they will likely get an ultrasound.

## 2023-10-01 ENCOUNTER — Ambulatory Visit (INDEPENDENT_AMBULATORY_CARE_PROVIDER_SITE_OTHER): Payer: Medicare Other | Admitting: Family Medicine

## 2023-10-01 VITALS — BP 130/86 | HR 88 | Temp 97.7°F | Ht 61.0 in | Wt 149.0 lb

## 2023-10-01 DIAGNOSIS — S0181XA Laceration without foreign body of other part of head, initial encounter: Secondary | ICD-10-CM | POA: Insufficient documentation

## 2023-10-01 DIAGNOSIS — S0181XD Laceration without foreign body of other part of head, subsequent encounter: Secondary | ICD-10-CM

## 2023-10-01 DIAGNOSIS — S52511D Displaced fracture of right radial styloid process, subsequent encounter for closed fracture with routine healing: Secondary | ICD-10-CM

## 2023-10-01 DIAGNOSIS — E041 Nontoxic single thyroid nodule: Secondary | ICD-10-CM

## 2023-10-01 DIAGNOSIS — S52513D Displaced fracture of unspecified radial styloid process, subsequent encounter for closed fracture with routine healing: Secondary | ICD-10-CM | POA: Insufficient documentation

## 2023-10-01 DIAGNOSIS — E042 Nontoxic multinodular goiter: Secondary | ICD-10-CM | POA: Insufficient documentation

## 2023-10-01 DIAGNOSIS — W19XXXA Unspecified fall, initial encounter: Secondary | ICD-10-CM | POA: Diagnosis not present

## 2023-10-01 NOTE — Progress Notes (Signed)
 Patient ID: Lisa Odom, female    DOB: 1956-08-09, 68 y.o.   MRN: [id number]  This visit was conducted in person.  BP 130/86   Pulse 88   Temp 97.7 F (36.5 C) (Temporal)   Ht 5' 1 (1.549 m)   Wt 149 lb (67.6 kg)   SpO2 98%   BMI 28.15 kg/m    CC:  Chief Complaint  Patient presents with   Hospitalization Follow-up    ED f/u for Fall F/u on incidental prominent thyroid  with question left thyroid  gland hypodense finding    Subjective:   HPI: Lisa Odom is a 68 y.o. female presenting on 10/01/2023 for Hospitalization Follow-up (ED f/u for Fall/F/u on incidental prominent thyroid  with question left thyroid  gland hypodense finding)  Reviewed recent emergency room visit from September 25, 2023 following fall resulting in facial laceration and closed displaced fracture of styloid process of right radius Fall occurred after she slipped getting out of the car, she struck her head.  CT head/cervical spine: No bleed noted Incidental finding of prominent thyroid  with possible left thyroid  nodule   Pain control: Given hydrocodone  as needed for pain. Laceration repaired with Dermabond. Referred to orthopedics.  Today she reports  she is doing well.  No proceeding symptoms to fall.. no CP, no syncope, no palpitations.  Laceration improving  Appt with Ortho tommorow.  Pain controlled with ibuprofen .. only has needed hydrocodone  for pain.   Relevant past medical, surgical, family and social history reviewed and updated as indicated. Interim medical history since our last visit reviewed. Allergies and medications reviewed and updated. Outpatient Medications Prior to Visit  Medication Sig Dispense Refill   ACCU-CHEK GUIDE test strip USE TO CHECK BLOOD SUGAR 3 TIMES DAILY 300 strip 2   Accu-Chek Softclix Lancets lancets CHECK BLOOD SUGAR 3 TIMES DAILY 300 each 2   alendronate  (FOSAMAX ) 70 MG tablet TAKE 1 TABLET BY MOUTH WEEKLY  WITH 8 OZ OF PLAIN WATER  30  MINUTES  BEFORE FIRST FOOD, DRINK OR MEDS. STAY UPRIGHT FOR 30  MINS 12 tablet 3   ALPRAZolam  (XANAX ) 0.5 MG tablet Take 0.5 mg by mouth 3 (three) times daily as needed for anxiety.      amphetamine -dextroamphetamine  (ADDERALL) 7.5 MG tablet Take 7.5 mg by mouth 2 (two) times daily. Morning and after lunch     atorvastatin  (LIPITOR) 40 MG tablet Take 1 tablet (40 mg total) by mouth daily. 100 tablet 1   Blood Glucose Calibration (ACCU-CHEK GUIDE CONTROL) LIQD 1 each by In Vitro route daily as needed. 1 each 3   Blood Glucose Monitoring Suppl (ACCU-CHEK GUIDE ME) w/Device KIT USE IN THE MORNING , AT NOON,  AND AT BEDTIME 1 kit 0   buPROPion  (WELLBUTRIN  XL) 150 MG 24 hr tablet TAKE 1 TABLET BY MOUTH  DAILY 90 tablet 2   CALCIUM -VITAMIN D  PO Take 600 mg by mouth 2 (two) times a day.      diclofenac  Sodium (VOLTAREN ) 1 % GEL Apply 2 g topically 4 (four) times daily as needed.     DULoxetine  (CYMBALTA ) 60 MG capsule TAKE 2 CAPSULES BY MOUTH  DAILY 180 capsule 2   gabapentin  (NEURONTIN ) 600 MG tablet TAKE ONE-HALF TABLET BY MOUTH IN THE MORNING AND AFTER LUNCH THEN TAKE 2 TABLETS BY MOUTH AT  BEDTIME 300 tablet 1   HYDROcodone -acetaminophen  (NORCO/VICODIN) 5-325 MG tablet Take 1 tablet by mouth every 6 (six) hours as needed for severe pain (pain score 7-10). 8 tablet 0  ibuprofen  (ADVIL ) 800 MG tablet TAKE 1 TABLET BY MOUTH EVERY 8  HOURS AS NEEDED 90 tablet 11   montelukast  (SINGULAIR ) 10 MG tablet TAKE 1 TABLET BY MOUTH AT  BEDTIME 100 tablet 2   Multiple Vitamin (MULTIVITAMIN) tablet Take 1 tablet by mouth daily.     Omega-3 Fatty Acids (FISH OIL) 1200 MG CAPS Take 1,200 mg by mouth daily.      pantoprazole  (PROTONIX ) 40 MG tablet Take 1 tablet (40 mg total) by mouth daily. 100 tablet 1   traZODone  (DESYREL ) 50 MG tablet TAKE 1/2 TO 1 TABLET BY MOUTH AT BEDTIME AS NEEDED FOR SLEEP 100 tablet 1   Vitamin D , Ergocalciferol , (DRISDOL ) 1.25 MG (50000 UNIT) CAPS capsule Take 1 capsule (50,000 Units total) by mouth  every 7 (seven) days. 12 capsule 0   No facility-administered medications prior to visit.     Per HPI unless specifically indicated in ROS section below Review of Systems  Constitutional:  Negative for fatigue and fever.  HENT:  Negative for congestion.   Eyes:  Negative for pain.  Respiratory:  Negative for cough and shortness of breath.   Cardiovascular:  Negative for chest pain, palpitations and leg swelling.  Gastrointestinal:  Negative for abdominal pain.  Genitourinary:  Negative for dysuria and vaginal bleeding.  Musculoskeletal:  Negative for back pain.  Neurological:  Negative for syncope, light-headedness and headaches.  Psychiatric/Behavioral:  Negative for dysphoric mood.    Objective:  BP 130/86   Pulse 88   Temp 97.7 F (36.5 C) (Temporal)   Ht 5' 1 (1.549 m)   Wt 149 lb (67.6 kg)   SpO2 98%   BMI 28.15 kg/m   Wt Readings from Last 3 Encounters:  10/01/23 149 lb (67.6 kg)  09/26/23 145 lb (65.8 kg)  06/19/23 145 lb 4.8 oz (65.9 kg)      Physical Exam Constitutional:      General: She is not in acute distress.    Appearance: Normal appearance. She is well-developed. She is not ill-appearing or toxic-appearing.  HENT:     Head: Normocephalic.     Right Ear: Hearing, tympanic membrane, ear canal and external ear normal. Tympanic membrane is not erythematous, retracted or bulging.     Left Ear: Hearing, tympanic membrane, ear canal and external ear normal. Tympanic membrane is not erythematous, retracted or bulging.     Nose: No mucosal edema or rhinorrhea.     Right Sinus: No maxillary sinus tenderness or frontal sinus tenderness.     Left Sinus: No maxillary sinus tenderness or frontal sinus tenderness.     Mouth/Throat:     Pharynx: Uvula midline.  Eyes:     General: Lids are normal. Lids are everted, no foreign bodies appreciated.     Conjunctiva/sclera: Conjunctivae normal.     Pupils: Pupils are equal, round, and reactive to light.  Neck:      Thyroid : No thyroid  mass or thyromegaly.     Vascular: No carotid bruit.     Trachea: Trachea normal.  Cardiovascular:     Rate and Rhythm: Normal rate and regular rhythm.     Pulses: Normal pulses.     Heart sounds: Normal heart sounds, S1 normal and S2 normal. No murmur heard.    No friction rub. No gallop.  Pulmonary:     Effort: Pulmonary effort is normal. No tachypnea or respiratory distress.     Breath sounds: Normal breath sounds. No decreased breath sounds, wheezing, rhonchi or rales.  Abdominal:     General: Bowel sounds are normal.     Palpations: Abdomen is soft.     Tenderness: There is no abdominal tenderness.  Musculoskeletal:     Cervical back: Normal range of motion and neck supple.     Comments: Right arm in cast in sling  Skin:    General: Skin is warm and dry.     Findings: No rash.     Comments: Healing 1 cm laceration right temple with Dermabond in place, no associated erythema or discharge  Neurological:     Mental Status: She is alert.  Psychiatric:        Mood and Affect: Mood is not anxious or depressed.        Speech: Speech normal.        Behavior: Behavior normal. Behavior is cooperative.        Thought Content: Thought content normal.        Judgment: Judgment normal.       Results for orders placed or performed in visit on 06/24/23  HM DEXA SCAN   Collection Time: 06/23/23 10:33 AM  Result Value Ref Range   HM Dexa Scan Osteopenia     Assessment and Plan  Thyroid  nodule Assessment & Plan: Acute incidental finding on CT cervical spine. Will evaluate further with TSH and thyroid  ultrasound.  Orders: -     US  THYROID ; Future -     TSH  Accidental fall, initial encounter Assessment & Plan: No preceding symptoms but patient is unsteady on her feet.  Encouraged her to consider using cane for stability.   Facial laceration, subsequent encounter Assessment & Plan: Acute, well-healing lesion with no associated infection. Continue to wash  with warm soapy water .   Closed displaced fracture of styloid process of right radius with routine healing, subsequent encounter Assessment & Plan: Acute, pain well-controlled with Tylenol .  Keep appointment for reevaluation with orthopedics.     No follow-ups on file.   Greig Ring, MD

## 2023-10-01 NOTE — Assessment & Plan Note (Signed)
 Acute, well-healing lesion with no associated infection. Continue to wash with warm soapy water.

## 2023-10-01 NOTE — Assessment & Plan Note (Signed)
 No preceding symptoms but patient is unsteady on her feet.  Encouraged her to consider using cane for stability.

## 2023-10-01 NOTE — Assessment & Plan Note (Signed)
 Acute incidental finding on CT cervical spine. Will evaluate further with TSH and thyroid ultrasound.

## 2023-10-01 NOTE — Assessment & Plan Note (Signed)
 Acute, pain well-controlled with Tylenol.  Keep appointment for reevaluation with orthopedics.

## 2023-10-01 NOTE — Patient Instructions (Signed)
Please stop at the lab to have labs drawn.  

## 2023-10-02 DIAGNOSIS — S52571A Other intraarticular fracture of lower end of right radius, initial encounter for closed fracture: Secondary | ICD-10-CM | POA: Diagnosis not present

## 2023-10-02 LAB — TSH: TSH: 0.86 u[IU]/mL (ref 0.35–5.50)

## 2023-10-05 ENCOUNTER — Other Ambulatory Visit: Payer: Medicare Other

## 2023-10-07 ENCOUNTER — Other Ambulatory Visit: Payer: Medicare Other

## 2023-10-15 DIAGNOSIS — S52571A Other intraarticular fracture of lower end of right radius, initial encounter for closed fracture: Secondary | ICD-10-CM | POA: Diagnosis not present

## 2023-10-16 ENCOUNTER — Ambulatory Visit
Admission: RE | Admit: 2023-10-16 | Discharge: 2023-10-16 | Disposition: A | Discharge status: Home / Self Care | Payer: Medicare Other | Source: Ambulatory Visit | Attending: Family Medicine | Admitting: Family Medicine

## 2023-10-16 DIAGNOSIS — E042 Nontoxic multinodular goiter: Secondary | ICD-10-CM | POA: Diagnosis not present

## 2023-10-16 DIAGNOSIS — E041 Nontoxic single thyroid nodule: Secondary | ICD-10-CM

## 2023-10-21 ENCOUNTER — Encounter: Payer: Self-pay | Admitting: Family Medicine

## 2023-10-21 DIAGNOSIS — E042 Nontoxic multinodular goiter: Secondary | ICD-10-CM

## 2023-10-27 NOTE — Telephone Encounter (Signed)
Clinical team is reviewing her records now. Will keep you updated. -Albin Felling

## 2023-11-04 DIAGNOSIS — S52571A Other intraarticular fracture of lower end of right radius, initial encounter for closed fracture: Secondary | ICD-10-CM | POA: Diagnosis not present

## 2023-11-06 ENCOUNTER — Ambulatory Visit: Payer: Medicare Other | Admitting: Family Medicine

## 2023-11-06 ENCOUNTER — Encounter: Payer: Self-pay | Admitting: Family Medicine

## 2023-11-06 VITALS — BP 130/80 | HR 76 | Temp 97.1°F | Ht 61.0 in | Wt 142.4 lb

## 2023-11-06 DIAGNOSIS — J0100 Acute maxillary sinusitis, unspecified: Secondary | ICD-10-CM

## 2023-11-06 MED ORDER — PREDNISONE 10 MG PO TABS
ORAL_TABLET | ORAL | 0 refills | Status: DC
Start: 2023-11-06 — End: 2023-11-24

## 2023-11-06 NOTE — Progress Notes (Signed)
 Patient ID: Lisa Odom, female    DOB: Jan 29, 1956, 68 y.o.   MRN: [id number]  This visit was conducted in person.  BP 130/80 (BP Location: Left Arm, Patient Position: Sitting, Cuff Size: Normal)   Pulse 76   Temp (!) 97.1 F (36.2 C) (Temporal)   Ht 5' 1 (1.549 m)   Wt 142 lb 6 oz (64.6 kg)   SpO2 97%   BMI 26.90 kg/m    CC:  Chief Complaint  Patient presents with   Sinus Drainage    With yellow mucus-Started Wedsnesday   Facial Pain   Ear Pain    Subjective:   HPI: Lisa Odom is a 68 y.o. female presenting on 11/06/2023 for Sinus Drainage (With yellow mucus-Started Wedsnesday), Facial Pain, and Ear Pain   Date of onset: 3 days Initial symptoms included  runny nose,  nasal congestion, face [pain Symptoms progressed to ST, ear pain. PND causing cough, sinus headache  No fever.  Chills, NO body aches.  Mild fatigue.  No SOB, no wheeze.  Cough not keeping her up at night    Sick contacts:  none COVID testing:   none     She has tried to treat with  sudafed    HAs allergies. Type 2 DM. No history of chronic lung disease such as asthma or COPD. Non-smoker.       Relevant past medical, surgical, family and social history reviewed and updated as indicated. Interim medical history since our last visit reviewed. Allergies and medications reviewed and updated. Outpatient Medications Prior to Visit  Medication Sig Dispense Refill   ACCU-CHEK GUIDE test strip USE TO CHECK BLOOD SUGAR 3 TIMES DAILY 300 strip 2   Accu-Chek Softclix Lancets lancets CHECK BLOOD SUGAR 3 TIMES DAILY 300 each 2   alendronate  (FOSAMAX ) 70 MG tablet TAKE 1 TABLET BY MOUTH WEEKLY  WITH 8 OZ OF PLAIN WATER  30  MINUTES BEFORE FIRST FOOD, DRINK OR MEDS. STAY UPRIGHT FOR 30  MINS 12 tablet 3   ALPRAZolam  (XANAX ) 0.5 MG tablet Take 0.5 mg by mouth 3 (three) times daily as needed for anxiety.      amphetamine -dextroamphetamine  (ADDERALL) 15 MG tablet Take 15 mg by mouth 2 (two)  times daily.     atorvastatin  (LIPITOR) 40 MG tablet Take 1 tablet (40 mg total) by mouth daily. 100 tablet 1   Blood Glucose Calibration (ACCU-CHEK GUIDE CONTROL) LIQD 1 each by In Vitro route daily as needed. 1 each 3   Blood Glucose Monitoring Suppl (ACCU-CHEK GUIDE ME) w/Device KIT USE IN THE MORNING , AT NOON,  AND AT BEDTIME 1 kit 0   buPROPion  (WELLBUTRIN  XL) 150 MG 24 hr tablet TAKE 1 TABLET BY MOUTH  DAILY 90 tablet 2   CALCIUM -VITAMIN D  PO Take 600 mg by mouth 2 (two) times a day.      diclofenac  Sodium (VOLTAREN ) 1 % GEL Apply 2 g topically 4 (four) times daily as needed.     DULoxetine  (CYMBALTA ) 60 MG capsule TAKE 2 CAPSULES BY MOUTH  DAILY 180 capsule 2   gabapentin  (NEURONTIN ) 600 MG tablet TAKE ONE-HALF TABLET BY MOUTH IN THE MORNING AND AFTER LUNCH THEN TAKE 2 TABLETS BY MOUTH AT  BEDTIME 300 tablet 1   ibuprofen  (ADVIL ) 800 MG tablet TAKE 1 TABLET BY MOUTH EVERY 8  HOURS AS NEEDED 90 tablet 11   montelukast  (SINGULAIR ) 10 MG tablet TAKE 1 TABLET BY MOUTH AT  BEDTIME 100 tablet 2  Multiple Vitamin (MULTIVITAMIN) tablet Take 1 tablet by mouth daily.     Omega-3 Fatty Acids (FISH OIL) 1200 MG CAPS Take 1,200 mg by mouth daily.      pantoprazole  (PROTONIX ) 40 MG tablet Take 1 tablet (40 mg total) by mouth daily. 100 tablet 1   traZODone  (DESYREL ) 50 MG tablet TAKE 1/2 TO 1 TABLET BY MOUTH AT BEDTIME AS NEEDED FOR SLEEP 100 tablet 1   Vitamin D , Ergocalciferol , (DRISDOL ) 1.25 MG (50000 UNIT) CAPS capsule Take 1 capsule (50,000 Units total) by mouth every 7 (seven) days. 12 capsule 0   amphetamine -dextroamphetamine  (ADDERALL) 7.5 MG tablet Take 7.5 mg by mouth 2 (two) times daily. Morning and after lunch     HYDROcodone -acetaminophen  (NORCO/VICODIN) 5-325 MG tablet Take 1 tablet by mouth every 6 (six) hours as needed for severe pain (pain score 7-10). 8 tablet 0   No facility-administered medications prior to visit.     Per HPI unless specifically indicated in ROS section  below Review of Systems  Constitutional:  Negative for fatigue and fever.  HENT:  Negative for congestion.   Eyes:  Negative for pain.  Respiratory:  Negative for cough and shortness of breath.   Cardiovascular:  Negative for chest pain, palpitations and leg swelling.  Gastrointestinal:  Negative for abdominal pain.  Genitourinary:  Negative for dysuria and vaginal bleeding.  Musculoskeletal:  Negative for back pain.  Neurological:  Negative for syncope, light-headedness and headaches.  Psychiatric/Behavioral:  Negative for dysphoric mood.    Objective:  BP 130/80 (BP Location: Left Arm, Patient Position: Sitting, Cuff Size: Normal)   Pulse 76   Temp (!) 97.1 F (36.2 C) (Temporal)   Ht 5' 1 (1.549 m)   Wt 142 lb 6 oz (64.6 kg)   SpO2 97%   BMI 26.90 kg/m   Wt Readings from Last 3 Encounters:  11/06/23 142 lb 6 oz (64.6 kg)  10/01/23 149 lb (67.6 kg)  09/26/23 145 lb (65.8 kg)      Physical Exam Constitutional:      General: She is not in acute distress.    Appearance: Normal appearance. She is well-developed. She is not ill-appearing or toxic-appearing.  HENT:     Head: Normocephalic.     Right Ear: Hearing, tympanic membrane, ear canal and external ear normal. Tympanic membrane is not erythematous, retracted or bulging.     Left Ear: Hearing, tympanic membrane, ear canal and external ear normal. Tympanic membrane is not erythematous, retracted or bulging.     Nose: Congestion present. No mucosal edema or rhinorrhea.     Right Sinus: No maxillary sinus tenderness or frontal sinus tenderness.     Left Sinus: No maxillary sinus tenderness or frontal sinus tenderness.     Mouth/Throat:     Pharynx: Uvula midline.  Eyes:     General: Lids are normal. Lids are everted, no foreign bodies appreciated.     Conjunctiva/sclera: Conjunctivae normal.     Pupils: Pupils are equal, round, and reactive to light.  Neck:     Thyroid : No thyroid  mass or thyromegaly.     Vascular: No  carotid bruit.     Trachea: Trachea normal.  Cardiovascular:     Rate and Rhythm: Normal rate and regular rhythm.     Pulses: Normal pulses.     Heart sounds: Normal heart sounds, S1 normal and S2 normal. No murmur heard.    No friction rub. No gallop.  Pulmonary:     Effort: Pulmonary effort  is normal. No tachypnea or respiratory distress.     Breath sounds: Normal breath sounds. No decreased breath sounds, wheezing, rhonchi or rales.  Abdominal:     General: Bowel sounds are normal.     Palpations: Abdomen is soft.     Tenderness: There is no abdominal tenderness.  Musculoskeletal:     Cervical back: Normal range of motion and neck supple.  Skin:    General: Skin is warm and dry.     Findings: No rash.  Neurological:     Mental Status: She is alert.  Psychiatric:        Mood and Affect: Mood is not anxious or depressed.        Speech: Speech normal.        Behavior: Behavior normal. Behavior is cooperative.        Thought Content: Thought content normal.        Judgment: Judgment normal.       Results for orders placed or performed in visit on 10/01/23  TSH   Collection Time: 10/01/23  3:58 PM  Result Value Ref Range   TSH 0.86 0.35 - 5.50 uIU/mL    Assessment and Plan  There are no diagnoses linked to this encounter.  No follow-ups on file.   Greig Ring, MD

## 2023-11-18 NOTE — Assessment & Plan Note (Signed)
Acute, most likely initial viral infection versus allergic issue  No current evidence of bacterial superinfection.   Will treat with prednisone taper to relieve pressure and clear sinuses.  She can continue decongestant and add mucolytic as well as nasal saline irrigation. Return and ER precautions provided.

## 2023-11-21 ENCOUNTER — Other Ambulatory Visit: Payer: Self-pay | Admitting: Family Medicine

## 2023-11-23 ENCOUNTER — Other Ambulatory Visit: Payer: Self-pay | Admitting: Family Medicine

## 2023-11-24 ENCOUNTER — Ambulatory Visit (INDEPENDENT_AMBULATORY_CARE_PROVIDER_SITE_OTHER): Payer: Medicare Other | Admitting: Family Medicine

## 2023-11-24 ENCOUNTER — Encounter: Payer: Self-pay | Admitting: Family Medicine

## 2023-11-24 VITALS — BP 130/88 | HR 111 | Temp 97.2°F | Ht 61.0 in | Wt 141.5 lb

## 2023-11-24 DIAGNOSIS — I152 Hypertension secondary to endocrine disorders: Secondary | ICD-10-CM | POA: Diagnosis not present

## 2023-11-24 DIAGNOSIS — E042 Nontoxic multinodular goiter: Secondary | ICD-10-CM | POA: Diagnosis not present

## 2023-11-24 DIAGNOSIS — Z7984 Long term (current) use of oral hypoglycemic drugs: Secondary | ICD-10-CM

## 2023-11-24 DIAGNOSIS — E1159 Type 2 diabetes mellitus with other circulatory complications: Secondary | ICD-10-CM

## 2023-11-24 LAB — MICROALBUMIN / CREATININE URINE RATIO
Creatinine,U: 363.3 mg/dL
Microalb Creat Ratio: 14.1 mg/g (ref 0.0–30.0)
Microalb, Ur: 5.1 mg/dL — ABNORMAL HIGH (ref 0.0–1.9)

## 2023-11-24 LAB — POCT GLYCOSYLATED HEMOGLOBIN (HGB A1C): Hemoglobin A1C: 7 % — AB (ref 4.0–5.6)

## 2023-11-24 MED ORDER — METFORMIN HCL ER 500 MG PO TB24
500.0000 mg | ORAL_TABLET | Freq: Every day | ORAL | 11 refills | Status: DC
Start: 2023-11-24 — End: 2023-12-25

## 2023-11-24 NOTE — Telephone Encounter (Signed)
 Last office visit 11/06/2023 for Sinusitis.  Last refilled 07/09/23 for #300 with 1 refills.  Next Appt: DM 11/24/2023.

## 2023-11-24 NOTE — Assessment & Plan Note (Signed)
 Acute... has upcoming appt with ENDO for review and likely thyroid nodule biopsy.

## 2023-11-24 NOTE — Patient Instructions (Addendum)
 Start low dose metformin daily.  Work on low carbohydrate diet and regular exercise.  Can add zyrtec and flonase to singulair for  allergic runny nose.

## 2023-11-24 NOTE — Assessment & Plan Note (Signed)
 Chronic, at goal in office today on no medication.

## 2023-11-24 NOTE — Progress Notes (Signed)
 Patient ID: Lisa Odom, female    DOB: Sep 02, 1956, 68 y.o.   MRN: [id number]  This visit was conducted in person.  BP 130/88 (BP Location: Left Arm, Patient Position: Sitting, Cuff Size: Normal)   Pulse (!) 111   Temp (!) 97.2 F (36.2 C) (Temporal)   Ht 5\' 1"  (1.549 m)   Wt 141 lb 8 oz (64.2 kg)   SpO2 97%   BMI 26.74 kg/m    CC:  Chief Complaint  Patient presents with  . Diabetes    Subjective:   HPI: Lisa Odom is a 68 y.o. female presenting on 11/24/2023 for Diabetes   Acute displaced fracture of styloid process right radius, healing well under Ortho care.  Dr. Merlyn Lot  Thyroid ultrasound showed multiple nodules, few larger size, needing biopsy. Has appointment with endocrinology, new patient March 21.  Diabetes:  Diet controlled, inadequate lifestyle, increase stress with  sister illness. Lab Results  Component Value Date   HGBA1C 7.0 (A) 11/24/2023  Using medications without difficulties: Hypoglycemic episodes: Hyperglycemic episodes: Feet problems: no ulcers Blood Sugars averaging: 117-125 eye exam within last year: yes  Elevated Cholesterol:well controlled on atorvastatin 40 mg daily at last check Lab Results  Component Value Date   CHOL 124 05/19/2023   HDL 48.50 05/19/2023   LDLCALC 61 05/19/2023   TRIG 72.0 05/19/2023   CHOLHDL 3 05/19/2023  Using medications without problems: none Muscle aches:  none Diet compliance: Exercise: minimal Other complaints:  Hypertension:   Not on a medication.. she is using Adderall during the day for  focus. BP Readings from Last 3 Encounters:  11/24/23 130/88  11/06/23 130/80  10/01/23 130/86  Using medication without problems or lightheadedness:  none Chest pain with exertion: none Edema:none Short of breath:none Average home BPs: 130/80 Other issues:    Relevant past medical, surgical, family and social history reviewed and updated as indicated. Interim medical history since our last  visit reviewed. Allergies and medications reviewed and updated. Outpatient Medications Prior to Visit  Medication Sig Dispense Refill  . ACCU-CHEK GUIDE test strip USE TO CHECK BLOOD SUGAR 3 TIMES DAILY 300 strip 2  . Accu-Chek Softclix Lancets lancets CHECK BLOOD SUGAR 3 TIMES DAILY 300 each 2  . alendronate (FOSAMAX) 70 MG tablet TAKE 1 TABLET BY MOUTH WEEKLY  WITH 8 OZ OF PLAIN WATER 30  MINUTES BEFORE FIRST FOOD, DRINK OR MEDS. STAY UPRIGHT FOR 30  MINS 12 tablet 3  . ALPRAZolam (XANAX) 0.5 MG tablet Take 0.5 mg by mouth 3 (three) times daily as needed for anxiety.     Marland Kitchen amphetamine-dextroamphetamine (ADDERALL) 15 MG tablet Take 15 mg by mouth 2 (two) times daily.    Marland Kitchen atorvastatin (LIPITOR) 40 MG tablet Take 1 tablet (40 mg total) by mouth daily. 100 tablet 1  . Blood Glucose Calibration (ACCU-CHEK GUIDE CONTROL) LIQD 1 each by In Vitro route daily as needed. 1 each 3  . Blood Glucose Monitoring Suppl (ACCU-CHEK GUIDE ME) w/Device KIT USE IN THE MORNING , AT NOON,  AND AT BEDTIME 1 kit 0  . buPROPion (WELLBUTRIN XL) 150 MG 24 hr tablet TAKE 1 TABLET BY MOUTH  DAILY 90 tablet 2  . CALCIUM-VITAMIN D PO Take 600 mg by mouth 2 (two) times a day.     . diclofenac Sodium (VOLTAREN) 1 % GEL Apply 2 g topically 4 (four) times daily as needed.    . DULoxetine (CYMBALTA) 60 MG capsule TAKE 2 CAPSULES BY MOUTH  DAILY 180 capsule 2  . gabapentin (NEURONTIN) 600 MG tablet TAKE ONE-HALF TABLET BY MOUTH IN THE MORNING AND AFTER LUNCH THEN TAKE 2 TABLETS BY MOUTH AT  BEDTIME 300 tablet 1  . ibuprofen (ADVIL) 800 MG tablet TAKE 1 TABLET BY MOUTH EVERY 8  HOURS AS NEEDED 90 tablet 11  . montelukast (SINGULAIR) 10 MG tablet TAKE 1 TABLET BY MOUTH AT  BEDTIME 100 tablet 2  . Multiple Vitamin (MULTIVITAMIN) tablet Take 1 tablet by mouth daily.    . Omega-3 Fatty Acids (FISH OIL) 1200 MG CAPS Take 1,200 mg by mouth daily.     . pantoprazole (PROTONIX) 40 MG tablet Take 1 tablet (40 mg total) by mouth daily. 100  tablet 1  . traZODone (DESYREL) 50 MG tablet TAKE 1/2 TO 1 TABLET BY MOUTH AT BEDTIME AS NEEDED FOR SLEEP 100 tablet 1  . Vitamin D, Ergocalciferol, (DRISDOL) 1.25 MG (50000 UNIT) CAPS capsule Take 1 capsule (50,000 Units total) by mouth every 7 (seven) days. 12 capsule 0  . predniSONE (DELTASONE) 10 MG tablet 3 tabs by mouth daily x 3 days, then 2 tabs by mouth daily x 2 days then 1 tab by mouth daily x 2 days 15 tablet 0   No facility-administered medications prior to visit.     Per HPI unless specifically indicated in ROS section below Review of Systems  Constitutional:  Negative for fatigue, fever and unexpected weight change.  HENT:  Negative for congestion, ear pain, sinus pressure, sneezing, sore throat and trouble swallowing.   Eyes:  Negative for pain and itching.  Respiratory:  Negative for cough, shortness of breath and wheezing.   Cardiovascular:  Negative for chest pain, palpitations and leg swelling.  Gastrointestinal:  Negative for abdominal pain, blood in stool, constipation, diarrhea and nausea.  Genitourinary:  Negative for difficulty urinating, dysuria, hematuria, menstrual problem and vaginal discharge.  Skin:  Negative for rash.  Neurological:  Negative for syncope, weakness, light-headedness, numbness and headaches.  Psychiatric/Behavioral:  Negative for confusion and dysphoric mood. The patient is not nervous/anxious.    Objective:  BP 130/88 (BP Location: Left Arm, Patient Position: Sitting, Cuff Size: Normal)   Pulse (!) 111   Temp (!) 97.2 F (36.2 C) (Temporal)   Ht 5\' 1"  (1.549 m)   Wt 141 lb 8 oz (64.2 kg)   SpO2 97%   BMI 26.74 kg/m   Wt Readings from Last 3 Encounters:  11/24/23 141 lb 8 oz (64.2 kg)  11/06/23 142 lb 6 oz (64.6 kg)  10/01/23 149 lb (67.6 kg)      Physical Exam Vitals and nursing note reviewed.  Constitutional:      General: She is not in acute distress.    Appearance: Normal appearance. She is well-developed. She is not  ill-appearing or toxic-appearing.  HENT:     Head: Normocephalic.     Right Ear: Hearing, tympanic membrane, ear canal and external ear normal.     Left Ear: Hearing, tympanic membrane, ear canal and external ear normal.     Nose: Nose normal.  Eyes:     General: Lids are normal. Lids are everted, no foreign bodies appreciated.     Conjunctiva/sclera: Conjunctivae normal.     Pupils: Pupils are equal, round, and reactive to light.  Neck:     Thyroid: No thyroid mass or thyromegaly.     Vascular: No carotid bruit.     Trachea: Trachea normal.  Cardiovascular:     Rate and Rhythm:  Normal rate and regular rhythm.     Heart sounds: Normal heart sounds, S1 normal and S2 normal. No murmur heard.    No gallop.  Pulmonary:     Effort: Pulmonary effort is normal. No respiratory distress.     Breath sounds: Normal breath sounds. No wheezing, rhonchi or rales.  Abdominal:     General: Bowel sounds are normal. There is no distension or abdominal bruit.     Palpations: Abdomen is soft. There is no fluid wave or mass.     Tenderness: There is no abdominal tenderness. There is no guarding or rebound.     Hernia: No hernia is present.  Genitourinary:    Labia:        Right: Lesion present.        Left: Lesion present.   Musculoskeletal:     Cervical back: Normal range of motion and neck supple.  Lymphadenopathy:     Cervical: No cervical adenopathy.  Skin:    General: Skin is warm and dry.     Findings: No rash.  Neurological:     Mental Status: She is alert.     Cranial Nerves: No cranial nerve deficit.     Sensory: No sensory deficit.  Psychiatric:        Mood and Affect: Mood is not anxious or depressed.        Speech: Speech normal.        Behavior: Behavior normal. Behavior is cooperative.        Judgment: Judgment normal.      Results for orders placed or performed in visit on 11/24/23  POCT glycosylated hemoglobin (Hb A1C)   Collection Time: 11/24/23 10:44 AM  Result Value  Ref Range   Hemoglobin A1C 7.0 (A) 4.0 - 5.6 %   HbA1c POC (<> result, manual entry)     HbA1c, POC (prediabetic range)     HbA1c, POC (controlled diabetic range)       COVID 19 screen:  No recent travel or known exposure to COVID19 The patient denies respiratory symptoms of COVID 19 at this time. The importance of social distancing was discussed today.   Assessment and Plan   The patient's preventative maintenance and recommended screening tests for an annual wellness exam were reviewed in full today. Brought up to date unless services declined.  Counselled on the importance of diet, exercise, and its role in overall health and mortality. The patient's FH and SH was reviewed, including their home life, tobacco status, and drug and alcohol status.   Vaccine:  Due for  td,  uptodate PCV23, shingrix.  PAP 2016 nml pap, neg HPV. Asymptomatic. Mammogram every 1-2 years. Done 04/2023 COLON:  2019 Dr. Lavon Paganini repeat in 10 years.  Hep C done  DEXA 12/2020  improved from osteoporosis to osteopenia on fosamax, due for repeat.. ordered  Problem List Items Addressed This Visit     Hypertension associated with diabetes (HCC) (Chronic)   Chronic, at goal in office today on no medication.      Relevant Medications   metFORMIN (GLUCOPHAGE-XR) 500 MG 24 hr tablet   Multinodular thyroid   Acute... has upcoming appt with ENDO for review and likely thyroid nodule biopsy.      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 Patient will continue working on lifestyle change including decrease stress, low carbohydrate diet  and regular exercise but we will go ahead and start metformin ER 500 mg daily. She will return in 3 months for reevaluation.  Metformin ER 500 mg p.o. daily      Relevant Medications   metFORMIN (GLUCOPHAGE-XR) 500 MG 24 hr tablet    Other Relevant Orders   Microalbumin / creatinine urine ratio   POCT glycosylated hemoglobin (Hb A1C) (Completed)     Kerby Nora, MD

## 2023-11-24 NOTE — Assessment & Plan Note (Signed)
 Chronic, inadequate control, no improvement in the last 3 months with some lifestyle change. Patient will continue working on lifestyle change including decrease stress, low carbohydrate diet and regular exercise but we will go ahead and start metformin ER 500 mg daily. She will return in 3 months for reevaluation.  Metformin ER 500 mg p.o. daily

## 2023-12-02 ENCOUNTER — Encounter: Payer: Self-pay | Admitting: Family Medicine

## 2023-12-02 DIAGNOSIS — M1811 Unilateral primary osteoarthritis of first carpometacarpal joint, right hand: Secondary | ICD-10-CM | POA: Diagnosis not present

## 2023-12-02 DIAGNOSIS — S52571A Other intraarticular fracture of lower end of right radius, initial encounter for closed fracture: Secondary | ICD-10-CM | POA: Diagnosis not present

## 2023-12-03 ENCOUNTER — Other Ambulatory Visit: Payer: Self-pay

## 2023-12-03 DIAGNOSIS — E042 Nontoxic multinodular goiter: Secondary | ICD-10-CM

## 2023-12-16 ENCOUNTER — Other Ambulatory Visit: Payer: Medicare Other

## 2023-12-16 DIAGNOSIS — E042 Nontoxic multinodular goiter: Secondary | ICD-10-CM | POA: Diagnosis not present

## 2023-12-16 LAB — T4, FREE: Free T4: 1 ng/dL (ref 0.8–1.8)

## 2023-12-16 LAB — TSH: TSH: 0.88 m[IU]/L (ref 0.40–4.50)

## 2023-12-16 LAB — T3, FREE: T3, Free: 3 pg/mL (ref 2.3–4.2)

## 2023-12-18 ENCOUNTER — Encounter: Payer: Self-pay | Admitting: "Endocrinology

## 2023-12-18 ENCOUNTER — Ambulatory Visit: Payer: Medicare Other | Admitting: "Endocrinology

## 2023-12-18 VITALS — BP 160/90 | HR 100 | Ht 61.0 in | Wt 150.0 lb

## 2023-12-18 DIAGNOSIS — E042 Nontoxic multinodular goiter: Secondary | ICD-10-CM

## 2023-12-18 NOTE — Progress Notes (Signed)
 Outpatient Endocrinology Note Lisa Meggett, MD  12/18/23   Lisa Odom [birth year] [id number]  Referring Provider: Excell Seltzer, MD Primary Care Provider: Excell Seltzer, MD Subjective  No chief complaint on file.   Assessment & Plan  Diagnoses and all orders for this visit:  Multinodular goiter -     [place] FNA BX THYROID 1ST LESION AFIRMA; Future -     [place] FNA BIOPSY THYROID EA ADD LESION AFIRMA; Future    Lisa Odom has never been on any thyroid medication. No indication for thyroid medication. Patient is currently biochemically euthyroid.  Educated on thyroid axis.   Multinodular goiter found after CT scan spine reported prominent nodule 10/16/23 thyroid ultrasound images and report reviewed: multinodular goiter, ordered FNA for TR3 Nodule # 2: Right; Inferior, 2.5 cm x 2.2 x 1.7 cm and Nodule # 4: TR4 3.1 cm x 2.2 x 1.8 cm nodules F/U U/S thyroid in 09/2024   I have reviewed current medications, nurse's notes, allergies, vital signs, past medical and surgical history, family medical history, and social history for this encounter. Counseled patient on symptoms, examination findings, lab findings, imaging results, treatment decisions and monitoring and prognosis. The patient understood the recommendations and agrees with the treatment plan. All questions regarding treatment plan were fully answered.   Return in about 4 weeks (around 01/15/2024).   Lisa Dugger, MD  12/18/23   I have reviewed current medications, nurse's notes, allergies, vital signs, past medical and surgical history, family medical history, and social history for this encounter. Counseled patient on symptoms, examination findings, lab findings, imaging results, treatment decisions and monitoring and prognosis. The patient understood the recommendations and agrees with the treatment plan. All questions regarding treatment plan were fully answered.   History of Present Illness Lisa Odom is a 68 y.o. year old female who presents to our clinic with multinodular goiter diagnosed in 09/2023.    C/o pain due to osteoarthritis, fibromyalgia and sciatica   Difficulty walking a lot of days  Compressive symptoms:  dysphagia  Yes, sometimes  dysphonia  No positional dyspnea (especially with simultaneous arms elevation)  No  Smokes  No On biotin  Yes Personal history of head/neck surgery/irradiation  Yes, s/p surgery for C-spine   Component     Latest Ref Rng 12/16/2023  TSH     0.40 - 4.50 mIU/L 0.88   T4,Free(Direct)     0.8 - 1.8 ng/dL 1.0   Triiodothyronine,Free,Serum     2.3 - 4.2 pg/mL 3.0       10/16/23 THYROID ULTRASOUND  Incidental on CT. Thyroid nodules incidentally noted on thoracic spine CT performed 09/25/2023   COMPARISON:  Cervical spine CT-09/25/2023   FINDINGS: Parenchymal Echotexture: Mildly heterogenous   Isthmus: Normal in size measuring 0.3 cm in diameter   Right lobe: Borderline enlarged measuring 6.5 x 2.3 x 2.0 cm   Left lobe: Borderline enlarged measuring 7.7 x 2.5 x 2.0 cm   _________________________________________________________   Estimated total number of nodules >/= 1 cm: 2   Number of spongiform nodules >/=  2 cm not described below (TR1): 0   Number of mixed cystic and solid nodules >/= 1.5 cm not described below (TR2): 0   _________________________________________________________   There is a punctate (0.6 cm) hypoechoic nodule within mid, medial aspect of the right lobe of the thyroid (labeled 1), which does not meet criteria to recommend percutaneous sampling or continued dedicated follow-up.   _________________________________________________________  Nodule # 2:   Location: Right; Inferior   Maximum size: 2.5 cm; Other 2 dimensions: 2.2 x 1.7 cm   Composition: solid/almost completely solid (2)   Echogenicity: isoechoic (1)   Shape: not taller-than-wide (0)   Margins: ill-defined (0)    Echogenic foci: none (0)   ACR TI-RADS total points: 3.   ACR TI-RADS risk category: TR3 (3 points).   ACR TI-RADS recommendations:   **Given size (>/= 2.5 cm) and appearance, fine needle aspiration of this mildly suspicious nodule should be considered based on TI-RADS criteria.   _________________________________________________________   There is a punctate (0.8 cm) spongiform/benign-appearing nodule within the superior pole of the left lobe of the thyroid (labeled 3) which does not meet criteria to recommend percutaneous sampling or continued dedicated follow-up.   _________________________________________________________   Nodule # 4:   Location: Left; Inferior - correlates with the questioned nodule seen on preceding cervical spine CT   Maximum size: 3.1 cm; Other 2 dimensions: 2.2 x 1.8 cm   Composition: cannot determine (2)   Echogenicity: hypoechoic (2)   Shape: not taller-than-wide (0)   Margins: smooth (0)   Echogenic foci: none (0)   ACR TI-RADS total points: 4.   ACR TI-RADS risk category: TR4 (4-6 points).   ACR TI-RADS recommendations:   **Given size (>/= 1.5 cm) and appearance, fine needle aspiration of this moderately suspicious nodule should be considered based on TI-RADS criteria.   _________________________________________________________   IMPRESSION: 1. Thyromegaly with findings suggestive of multinodular goiter. 2. Nodules labeled #2 and #4 both meet imaging criteria to recommend percutaneous sampling as indicated. (Note, nodule labeled #4 correlates with the questioned nodule seen on preceding cervical spine CT). 3. None of the additional thyroid nodules meet imaging criteria to recommend percutaneous sampling or continued dedicated follow-up.  Physical Exam  BP (!) 160/90   Pulse 100   Ht 5\' 1"  (1.549 m)   Wt 150 lb (68 kg)   SpO2 96%   BMI 28.34 kg/m  Constitutional: well developed, well nourished Head: normocephalic,  atraumatic, no exophthalmos Eyes: sclera anicteric, no redness Neck: + thyromegaly, no thyroid tenderness; + nodule  Lungs: normal respiratory effort Neurology: alert and oriented, no fine hand tremor Skin: dry, no appreciable rashes Musculoskeletal: no appreciable defects Psychiatric: normal mood and affect  Allergies Allergies  Allergen Reactions   Hydrochlorothiazide Rash   Sulfonamide Derivatives Rash    Current Medications Patient's Medications  New Prescriptions   No medications on file  Previous Medications   ACCU-CHEK GUIDE TEST STRIP    USE TO CHECK BLOOD SUGAR 3 TIMES DAILY   ACCU-CHEK SOFTCLIX LANCETS LANCETS    CHECK BLOOD SUGAR 3 TIMES DAILY   ALENDRONATE (FOSAMAX) 70 MG TABLET    TAKE 1 TABLET BY MOUTH WEEKLY  WITH 8 OZ OF PLAIN WATER 30  MINUTES BEFORE FIRST FOOD, DRINK OR MEDS. STAY UPRIGHT FOR 30  MINS   ALPRAZOLAM (XANAX) 0.5 MG TABLET    Take 0.5 mg by mouth 3 (three) times daily as needed for anxiety.    AMPHETAMINE-DEXTROAMPHETAMINE (ADDERALL) 15 MG TABLET    Take 15 mg by mouth 2 (two) times daily.   ATORVASTATIN (LIPITOR) 40 MG TABLET    Take 1 tablet (40 mg total) by mouth daily.   BLOOD GLUCOSE CALIBRATION (ACCU-CHEK GUIDE CONTROL) LIQD    1 each by In Vitro route daily as needed.   BLOOD GLUCOSE MONITORING SUPPL (ACCU-CHEK GUIDE ME) W/DEVICE KIT    USE IN THE MORNING ,  AT NOON,  AND AT BEDTIME   BUPROPION (WELLBUTRIN XL) 150 MG 24 HR TABLET    TAKE 1 TABLET BY MOUTH  DAILY   CALCIUM-VITAMIN D PO    Take 600 mg by mouth 2 (two) times a day.    DICLOFENAC SODIUM (VOLTAREN) 1 % GEL    Apply 2 g topically 4 (four) times daily as needed.   DULOXETINE (CYMBALTA) 60 MG CAPSULE    TAKE 2 CAPSULES BY MOUTH  DAILY   GABAPENTIN (NEURONTIN) 600 MG TABLET    TAKE ONE-HALF TABLET BY MOUTH IN THE MORNING AND AFTER LUNCH THEN TAKE 2 TABLETS BY MOUTH AT  BEDTIME   IBUPROFEN (ADVIL) 800 MG TABLET    TAKE 1 TABLET BY MOUTH EVERY 8  HOURS AS NEEDED   METFORMIN (GLUCOPHAGE-XR)  500 MG 24 HR TABLET    Take 1 tablet (500 mg total) by mouth daily with breakfast.   MONTELUKAST (SINGULAIR) 10 MG TABLET    TAKE 1 TABLET BY MOUTH AT  BEDTIME   MULTIPLE VITAMIN (MULTIVITAMIN) TABLET    Take 1 tablet by mouth daily.   OMEGA-3 FATTY ACIDS (FISH OIL) 1200 MG CAPS    Take 1,200 mg by mouth daily.    PANTOPRAZOLE (PROTONIX) 40 MG TABLET    Take 1 tablet (40 mg total) by mouth daily.   TRAZODONE (DESYREL) 50 MG TABLET    TAKE 1/2 TO 1 TABLET BY MOUTH AT BEDTIME AS NEEDED FOR SLEEP   VITAMIN D, ERGOCALCIFEROL, (DRISDOL) 1.25 MG (50000 UNIT) CAPS CAPSULE    Take 1 capsule (50,000 Units total) by mouth every 7 (seven) days.  Modified Medications   No medications on file  Discontinued Medications   No medications on file    Past Medical History Past Medical History:  Diagnosis Date   Anxiety    Arthritis    Contact lens/glasses fitting    wears contacts or glasses   Depression    Diabetes mellitus without complication (HCC)    No meds   Diverticulosis    Family history of adverse reaction to anesthesia    pts sister has severe N/V   Fibromyalgia    Gastritis    GERD (gastroesophageal reflux disease)    Hemorrhoid    Hiatal hernia    neuropathy   Migraine    Neuropathy    Panic attacks    Pneumonia    as an infant    Past Surgical History Past Surgical History:  Procedure Laterality Date   ANTERIOR CERVICAL DECOMP/DISCECTOMY FUSION N/A 09/12/2014   Procedure: Evacuation of cervical hematoma;  Surgeon: Temple Pacini, MD;  Location: [facility];  Service: Neurosurgery;  Laterality: N/A;   ANTERIOR CERVICAL DECOMP/DISCECTOMY FUSION N/A 09/12/2014   Procedure: CERVICAL FOUR-FIVE, CERVICAL FIVE-SIX, CERVICAL SIX-SEVEN ANTERIOR CERVICAL DECOMPRESSION/DISCECTOMY FUSION;  Surgeon: Temple Pacini, MD;  Location: [facility];  Service: Neurosurgery;  Laterality: N/A;   BUNIONECTOMY     CARPAL TUNNEL RELEASE  2002   right   COLONOSCOPY     JOINT REPLACEMENT Left     SHOULDER ARTHROSCOPY WITH ROTATOR CUFF REPAIR  2010   right   TOTAL HIP ARTHROPLASTY Left 11/20/2017   Procedure: LEFT TOTAL HIP ARTHROPLASTY ANTERIOR APPROACH WITH BONE GRAFTING AND ACETABULAR SCREWS;  Surgeon: Jodi Geralds, MD;  Location: [facility];  Service: Orthopedics;  Laterality: Left;   TOTAL KNEE ARTHROPLASTY Right 03/11/2019   Procedure: TOTAL KNEE ARTHROPLASTY;  Surgeon: Jodi Geralds, MD;  Location: [facility];  Service:  Orthopedics;  Laterality: Right;   TRIGGER FINGER RELEASE Right 03/23/2013   Procedure: RELEASE TRIGGER FINGER/A-1 PULLEY RIGHT RING FINGER;  Surgeon: Nicki Reaper, MD;  Location: [facility];  Service: Orthopedics;  Laterality: Right;   TUBAL LIGATION     UPPER GI ENDOSCOPY      Family History family history includes Alcohol abuse in her maternal uncle and paternal uncle; Anxiety disorder in her sister; Breast cancer in her maternal grandmother; Diabetes in her maternal aunt, paternal aunt, and sister; Heart disease in her brother; Ovarian cancer in her cousin.  Social History Social History   Socioeconomic History   Marital status: Divorced    Spouse name: Not on file   Number of children: 1   Years of education: Not on file   Highest education level: Bachelor's degree (e.g., BA, AB, BS)  Occupational History    Employer: [employer]  Tobacco Use   Smoking status: Never   Smokeless tobacco: Never  Vaping Use   Vaping status: Never Used  Substance and Sexual Activity   Alcohol use: No    Alcohol/week: 0.0 standard drinks of alcohol   Drug use: No   Sexual activity: Yes  Other Topics Concern   Not on file  Social History Narrative   Lives alone in a one story home.  Has one child.     Currently not working.     Worked for [employer].     Social Drivers of Corporate investment banker Strain: Low Risk  (11/05/2023)   Overall Financial Resource Strain (CARDIA)    Difficulty of Paying Living Expenses: Not very hard  Food Insecurity: No Food  Insecurity (11/05/2023)   Hunger Vital Sign    Worried About Running Out of Food in the Last Year: Never true    Ran Out of Food in the Last Year: Never true  Transportation Needs: No Transportation Needs (11/05/2023)   PRAPARE - Administrator, Civil Service (Medical): No    Lack of Transportation (Non-Medical): No  Physical Activity: Insufficiently Active (11/05/2023)   Exercise Vital Sign    Days of Exercise per Week: 2 days    Minutes of Exercise per Session: 20 min  Stress: Stress Concern Present (11/05/2023)   [employer] of Occupational Health - Occupational Stress Questionnaire    Feeling of Stress : To some extent  Social Connections: Socially Isolated (11/05/2023)   Social Connection and Isolation Panel [NHANES]    Frequency of Communication with Friends and Family: More than three times a week    Frequency of Social Gatherings with Friends and Family: Patient declined    Attends Religious Services: Never    Database administrator or Organizations: No    Attends Engineer, structural: Not on file    Marital Status: Divorced  Intimate Partner Violence: Not At Risk (12/11/2020)   Humiliation, Afraid, Rape, and Kick questionnaire    Fear of Current or Ex-Partner: No    Emotionally Abused: No    Physically Abused: No    Sexually Abused: No    Laboratory Investigations Lab Results  Component Value Date   TSH 0.88 12/16/2023   TSH 0.86 10/01/2023   TSH 0.74 05/20/2016   FREET4 1.0 12/16/2023     No results found for: "TSI"   No components found for: "TRAB"   Lab Results  Component Value Date   CHOL 124 05/19/2023   Lab Results  Component Value Date   HDL 48.50  05/19/2023   Lab Results  Component Value Date   LDLCALC 61 05/19/2023   Lab Results  Component Value Date   TRIG 72.0 05/19/2023   Lab Results  Component Value Date   CHOLHDL 3 05/19/2023   Lab Results  Component Value Date   CREATININE 0.75 05/19/2023   Lab Results  Component  Value Date   GFR 81.20 05/19/2023      Component Value Date/Time   NA 142 05/19/2023 0929   NA 141 07/31/2014 0834   K 4.4 05/19/2023 0929   CL 104 05/19/2023 0929   CO2 30 05/19/2023 0929   GLUCOSE 117 (H) 05/19/2023 0929   BUN 21 05/19/2023 0929   BUN 15 07/31/2014 0834   CREATININE 0.75 05/19/2023 0929   CREATININE 0.64 09/16/2019 1630   CALCIUM 10.4 05/19/2023 0929   PROT 6.9 05/19/2023 0929   PROT 6.8 07/31/2014 0834   ALBUMIN 4.3 05/19/2023 0929   ALBUMIN 4.0 07/31/2014 0834   AST 20 05/19/2023 0929   ALT 18 05/19/2023 0929   ALKPHOS 93 05/19/2023 0929   BILITOT 0.4 05/19/2023 0929   GFRNONAA >60 03/04/2019 1052   GFRAA >60 03/04/2019 1052      Latest Ref Rng & Units 05/19/2023    9:29 AM 11/19/2022    9:00 AM 05/09/2022    8:25 AM  BMP  Glucose 70 - 99 mg/dL 657  97  846   BUN 6 - 23 mg/dL 21  15  13    Creatinine 0.40 - 1.20 mg/dL 9.62  9.52  8.41   Sodium 135 - 145 mEq/L 142  140  140   Potassium 3.5 - 5.1 mEq/L 4.4  4.2  3.8   Chloride 96 - 112 mEq/L 104  104  102   CO2 19 - 32 mEq/L 30  27  29    Calcium 8.4 - 10.5 mg/dL 32.4  9.3  9.3        Component Value Date/Time   WBC 12.8 (H) 03/12/2019 0240   RBC 4.34 03/12/2019 0240   HGB 12.6 03/12/2019 0240   HCT 40.1 03/12/2019 0240   PLT 353 03/12/2019 0240   MCV 92.4 03/12/2019 0240   MCH 29.0 03/12/2019 0240   MCHC 31.4 03/12/2019 0240   RDW 13.6 03/12/2019 0240   RDW 15.0 07/31/2014 0834   LYMPHSABS 2.1 03/04/2019 1052   LYMPHSABS 2.5 07/31/2014 0834   MONOABS 0.5 03/04/2019 1052   EOSABS 0.4 03/04/2019 1052   EOSABS 0.3 07/31/2014 0834   BASOSABS 0.1 03/04/2019 1052   BASOSABS 0.0 07/31/2014 0834      Parts of this note may have been dictated using voice recognition software. There may be variances in spelling and vocabulary which are unintentional. Not all errors are proofread. Please notify the Thereasa Parkin if any discrepancies are noted or if the meaning of any statement is not clear.

## 2023-12-25 ENCOUNTER — Other Ambulatory Visit: Payer: Self-pay | Admitting: Family Medicine

## 2023-12-25 MED ORDER — METFORMIN HCL ER 500 MG PO TB24: 500.0000 mg | ORAL_TABLET | Freq: Every day | ORAL | 3 refills | Status: AC

## 2024-01-05 ENCOUNTER — Ambulatory Visit
Admission: RE | Admit: 2024-01-05 | Discharge: 2024-01-05 | Disposition: A | Discharge status: Home / Self Care | Source: Ambulatory Visit | Attending: "Endocrinology | Admitting: "Endocrinology

## 2024-01-05 ENCOUNTER — Other Ambulatory Visit (HOSPITAL_COMMUNITY)
Admission: RE | Admit: 2024-01-05 | Discharge: 2024-01-05 | Disposition: A | Discharge status: Home / Self Care | Source: Ambulatory Visit | Attending: Student | Admitting: Student

## 2024-01-05 DIAGNOSIS — E042 Nontoxic multinodular goiter: Secondary | ICD-10-CM | POA: Diagnosis not present

## 2024-01-05 DIAGNOSIS — E041 Nontoxic single thyroid nodule: Secondary | ICD-10-CM | POA: Diagnosis not present

## 2024-01-06 DIAGNOSIS — S52571A Other intraarticular fracture of lower end of right radius, initial encounter for closed fracture: Secondary | ICD-10-CM | POA: Diagnosis not present

## 2024-01-06 LAB — CYTOLOGY - NON PAP

## 2024-01-11 ENCOUNTER — Telehealth: Payer: Self-pay | Admitting: "Endocrinology

## 2024-01-11 NOTE — Telephone Encounter (Signed)
 Patient states that is returning telephone call about results.

## 2024-01-13 NOTE — Telephone Encounter (Signed)
 Spoke to pt regarding results.

## 2024-01-20 ENCOUNTER — Other Ambulatory Visit: Payer: Self-pay | Admitting: Family Medicine

## 2024-02-23 ENCOUNTER — Ambulatory Visit (INDEPENDENT_AMBULATORY_CARE_PROVIDER_SITE_OTHER): Payer: Medicare Other | Admitting: Family Medicine

## 2024-02-23 ENCOUNTER — Ambulatory Visit: Payer: Self-pay | Admitting: Family Medicine

## 2024-02-23 ENCOUNTER — Encounter: Payer: Self-pay | Admitting: Family Medicine

## 2024-02-23 VITALS — BP 138/80 | HR 100 | Temp 98.4°F | Ht 61.0 in | Wt 147.4 lb

## 2024-02-23 DIAGNOSIS — E1159 Type 2 diabetes mellitus with other circulatory complications: Secondary | ICD-10-CM

## 2024-02-23 DIAGNOSIS — I152 Hypertension secondary to endocrine disorders: Secondary | ICD-10-CM

## 2024-02-23 LAB — POCT GLYCOSYLATED HEMOGLOBIN (HGB A1C): Hemoglobin A1C: 6.7 % — AB (ref 4.0–5.6)

## 2024-02-23 NOTE — Assessment & Plan Note (Addendum)
 Chronic,  now improved in the last 3 months with lifestyle change and metformin  XL 500 mg daily.

## 2024-02-23 NOTE — Progress Notes (Signed)
 Patient ID: Lisa Odom, female    DOB: 1955/12/31, 68 y.o.   MRN: [id number]  This visit was conducted in person.  BP 138/80   Pulse 100   Temp 98.4 F (36.9 C) (Oral)   Ht 5\' 1"  (1.549 m)   Wt 147 lb 6 oz (66.8 kg)   SpO2 96%   BMI 27.85 kg/m    CC:  Chief Complaint  Patient presents with   Diabetes    Subjective:   HPI: Lisa Odom is a 68 y.o. female presenting on 02/23/2024 for Diabetes  Diabetes:   On metformin  XL   500 mg daily. now improved  Lab Results  Component Value Date   HGBA1C 6.7 (A) 02/23/2024  Using medications without difficulties: Hypoglycemic episodes: Hyperglycemic episodes: Feet problems: no ulcers Blood Sugars averaging: 84-121 eye exam within last year: yes Other complaints:  Hypertension:   Not on a medication.. she is using Adderall during the day for  focus. BP Readings from Last 3 Encounters:  02/23/24 138/80  12/18/23 (!) 160/90  11/24/23 130/88  Using medication without problems or lightheadedness:  none Chest pain with exertion: none Edema:none Short of breath:none Average home BPs: 130/80 Other issues:    Relevant past medical, surgical, family and social history reviewed and updated as indicated. Interim medical history since our last visit reviewed. Allergies and medications reviewed and updated. Outpatient Medications Prior to Visit  Medication Sig Dispense Refill   ACCU-CHEK GUIDE test strip USE TO CHECK BLOOD SUGAR 3 TIMES DAILY 300 strip 2   Accu-Chek Softclix Lancets lancets CHECK BLOOD SUGAR 3 TIMES DAILY 300 each 2   alendronate  (FOSAMAX ) 70 MG tablet TAKE 1 TABLET BY MOUTH WEEKLY  WITH 8 OZ OF PLAIN WATER  30  MINUTES BEFORE FIRST FOOD, DRINK OR MEDS. STAY UPRIGHT FOR 30  MINS 12 tablet 3   ALPRAZolam  (XANAX ) 0.5 MG tablet Take 0.5 mg by mouth 3 (three) times daily as needed for anxiety.      amphetamine -dextroamphetamine  (ADDERALL) 15 MG tablet Take 15 mg by mouth 2 (two) times daily.      atorvastatin  (LIPITOR) 40 MG tablet Take 1 tablet (40 mg total) by mouth daily. 100 tablet 1   Blood Glucose Calibration (ACCU-CHEK GUIDE CONTROL) LIQD 1 each by In Vitro route daily as needed. 1 each 3   Blood Glucose Monitoring Suppl (ACCU-CHEK GUIDE ME) w/Device KIT USE IN THE MORNING , AT NOON,  AND AT BEDTIME 1 kit 0   buPROPion  (WELLBUTRIN  XL) 150 MG 24 hr tablet TAKE 1 TABLET BY MOUTH  DAILY 90 tablet 2   CALCIUM -VITAMIN D  PO Take 600 mg by mouth 2 (two) times a day.      diclofenac  Sodium (VOLTAREN ) 1 % GEL Apply 2 g topically 4 (four) times daily as needed.     DULoxetine  (CYMBALTA ) 60 MG capsule TAKE 2 CAPSULES BY MOUTH  DAILY 180 capsule 2   gabapentin  (NEURONTIN ) 600 MG tablet TAKE ONE-HALF TABLET BY MOUTH IN THE MORNING AND AFTER LUNCH THEN TAKE 2 TABLETS BY MOUTH AT  BEDTIME 300 tablet 2   ibuprofen  (ADVIL ) 800 MG tablet TAKE 1 TABLET BY MOUTH EVERY 8  HOURS AS NEEDED 90 tablet 11   metFORMIN  (GLUCOPHAGE -XR) 500 MG 24 hr tablet Take 1 tablet (500 mg total) by mouth daily with breakfast. 90 tablet 3   montelukast  (SINGULAIR ) 10 MG tablet TAKE 1 TABLET BY MOUTH AT  BEDTIME 100 tablet 2   Multiple Vitamin (MULTIVITAMIN) tablet  Take 1 tablet by mouth daily.     Omega-3 Fatty Acids (FISH OIL) 1200 MG CAPS Take 1,200 mg by mouth daily.      pantoprazole  (PROTONIX ) 40 MG tablet Take 1 tablet (40 mg total) by mouth daily. 100 tablet 1   traZODone  (DESYREL ) 50 MG tablet TAKE 1/2 TO 1 TABLET BY MOUTH AT BEDTIME AS NEEDED FOR SLEEP 100 tablet 1   Vitamin D , Ergocalciferol , (DRISDOL ) 1.25 MG (50000 UNIT) CAPS capsule Take 1 capsule (50,000 Units total) by mouth every 7 (seven) days. 12 capsule 0   No facility-administered medications prior to visit.     Per HPI unless specifically indicated in ROS section below Review of Systems  Constitutional:  Negative for fatigue, fever and unexpected weight change.  HENT:  Negative for congestion, ear pain, sinus pressure, sneezing, sore throat and  trouble swallowing.   Eyes:  Negative for pain and itching.  Respiratory:  Negative for cough, shortness of breath and wheezing.   Cardiovascular:  Negative for chest pain, palpitations and leg swelling.  Gastrointestinal:  Negative for abdominal pain, blood in stool, constipation, diarrhea and nausea.  Genitourinary:  Negative for difficulty urinating, dysuria, hematuria, menstrual problem and vaginal discharge.  Skin:  Negative for rash.  Neurological:  Negative for syncope, weakness, light-headedness, numbness and headaches.  Psychiatric/Behavioral:  Negative for confusion and dysphoric mood. The patient is not nervous/anxious.    Objective:  BP 138/80   Pulse 100   Temp 98.4 F (36.9 C) (Oral)   Ht 5\' 1"  (1.549 m)   Wt 147 lb 6 oz (66.8 kg)   SpO2 96%   BMI 27.85 kg/m   Wt Readings from Last 3 Encounters:  02/23/24 147 lb 6 oz (66.8 kg)  12/18/23 150 lb (68 kg)  11/24/23 141 lb 8 oz (64.2 kg)      Physical Exam Vitals and nursing note reviewed.  Constitutional:      General: She is not in acute distress.    Appearance: Normal appearance. She is well-developed. She is not ill-appearing or toxic-appearing.  HENT:     Head: Normocephalic.     Right Ear: Hearing, tympanic membrane, ear canal and external ear normal.     Left Ear: Hearing, tympanic membrane, ear canal and external ear normal.     Nose: Nose normal.  Eyes:     General: Lids are normal. Lids are everted, no foreign bodies appreciated.     Conjunctiva/sclera: Conjunctivae normal.     Pupils: Pupils are equal, round, and reactive to light.  Neck:     Thyroid : No thyroid  mass or thyromegaly.     Vascular: No carotid bruit.     Trachea: Trachea normal.  Cardiovascular:     Rate and Rhythm: Normal rate and regular rhythm.     Heart sounds: Normal heart sounds, S1 normal and S2 normal. No murmur heard.    No gallop.  Pulmonary:     Effort: Pulmonary effort is normal. No respiratory distress.     Breath  sounds: Normal breath sounds. No wheezing, rhonchi or rales.  Abdominal:     General: Bowel sounds are normal. There is no distension or abdominal bruit.     Palpations: Abdomen is soft. There is no fluid wave or mass.     Tenderness: There is no abdominal tenderness. There is no guarding or rebound.     Hernia: No hernia is present.  Genitourinary:    Labia:        Right:  Lesion present.        Left: Lesion present.   Musculoskeletal:     Cervical back: Normal range of motion and neck supple.  Lymphadenopathy:     Cervical: No cervical adenopathy.  Skin:    General: Skin is warm and dry.     Findings: No rash.  Neurological:     Mental Status: She is alert.     Cranial Nerves: No cranial nerve deficit.     Sensory: No sensory deficit.  Psychiatric:        Mood and Affect: Mood is not anxious or depressed.        Speech: Speech normal.        Behavior: Behavior normal. Behavior is cooperative.        Judgment: Judgment normal.       Results for orders placed or performed in visit on 02/23/24  POCT glycosylated hemoglobin (Hb A1C)   Collection Time: 02/23/24  3:47 PM  Result Value Ref Range   Hemoglobin A1C 6.7 (A) 4.0 - 5.6 %   HbA1c POC (<> result, manual entry)     HbA1c, POC (prediabetic range)     HbA1c, POC (controlled diabetic range)       COVID 19 screen:  No recent travel or known exposure to COVID19 The patient denies respiratory symptoms of COVID 19 at this time. The importance of social distancing was discussed today.   Assessment and Plan    Problem List Items Addressed This Visit     Hypertension associated with diabetes (HCC) (Chronic)   Chronic, at goal in office today on no medication.      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      Relevant Orders   POCT glycosylated hemoglobin (Hb A1C) (Completed)      Herby Lolling, MD

## 2024-02-23 NOTE — Assessment & Plan Note (Signed)
 Chronic, at goal in office today on no medication.

## 2024-02-24 ENCOUNTER — Encounter: Payer: Self-pay | Admitting: Family Medicine

## 2024-04-04 ENCOUNTER — Other Ambulatory Visit: Payer: Self-pay | Admitting: Family Medicine

## 2024-04-05 ENCOUNTER — Other Ambulatory Visit: Payer: Self-pay | Admitting: Family Medicine

## 2024-04-05 DIAGNOSIS — G894 Chronic pain syndrome: Secondary | ICD-10-CM

## 2024-04-05 NOTE — Telephone Encounter (Signed)
 Please schedule Medicare Wellness with Erminio and CPE with fasting labs prior with Dr. Avelina after 05/25/2024.

## 2024-04-05 NOTE — Telephone Encounter (Signed)
 LVM to  schedule Medicare Wellness with Erminio and CPE with fasting labs prior with Dr. Avelina after 05/25/2024.

## 2024-04-12 NOTE — Telephone Encounter (Signed)
 Called and lvm also sent mychart message for patient to contact office to schedule appt

## 2024-04-13 ENCOUNTER — Ambulatory Visit: Admitting: "Endocrinology

## 2024-04-13 ENCOUNTER — Encounter: Payer: Self-pay | Admitting: "Endocrinology

## 2024-04-13 VITALS — BP 160/90 | HR 111 | Ht 61.0 in | Wt 249.0 lb

## 2024-04-13 DIAGNOSIS — E042 Nontoxic multinodular goiter: Secondary | ICD-10-CM

## 2024-04-13 NOTE — Progress Notes (Signed)
 Outpatient Endocrinology Note Lisa Odom  04/13/24   Lisa Odom May 19, 1956 [id number]  Referring Provider: Avelina Greig BRAVO, Odom Primary Care Provider: Avelina Greig BRAVO, Odom Subjective  No chief complaint on file.   Assessment & Plan  Diagnoses and all orders for this visit:  Multinodular goiter -     TSH + free T4 -     US  FNA BX THYROID  1ST LESION AFIRMA; Future -     US  FNA BIOPSY THYROID  EA ADD LESION AFIRMA; Future   Lisa Odom has never been on any thyroid  medication. No indication for thyroid  medication. Patient is currently biochemically euthyroid.  Educated on thyroid  axis.   Multinodular goiter found after CT scan spine reported prominent nodule 10/16/23 thyroid  ultrasound images and report reviewed: multinodular goiter, ordered FNA for TR3 Nodule # 2: Right; Inferior, 2.5 cm x 2.2 x 1.7 cm and Nodule # 4: TR4 3.1 cm x 2.2 x 1.8 cm nodules Both nodules' FNA came back [facility] grade I: reordered both  F/U U/S thyroid  in 09/2024   I have reviewed current medications, nurse's notes, allergies, vital signs, past medical and surgical history, family medical history, and social history for this encounter. Counseled patient on symptoms, examination findings, lab findings, imaging results, treatment decisions and monitoring and prognosis. The patient understood the recommendations and agrees with the treatment plan. All questions regarding treatment plan were fully answered.   Return in about 6 months (around 10/14/2024) for visit + labs before next visit.   Lisa Odom  04/13/24   I have reviewed current medications, nurse's notes, allergies, vital signs, past medical and surgical history, family medical history, and social history for this encounter. Counseled patient on symptoms, examination findings, lab findings, imaging results, treatment decisions and monitoring and prognosis. The patient understood the recommendations and agrees with  the treatment plan. All questions regarding treatment plan were fully answered.   History of Present Illness Lisa Odom is a 68 y.o. year old female who presents to our clinic with multinodular goiter diagnosed in 09/2023.    C/o pain due to osteoarthritis, fibromyalgia and sciatica   Difficulty walking a lot of days  Compressive symptoms:  dysphagia  Yes, sometimes  dysphonia  No positional dyspnea (especially with simultaneous arms elevation)  No  Smokes  No On biotin   Yes Personal history of head/neck surgery/irradiation  Yes, s/p surgery for C-spine   Component     Latest Ref Rng 12/16/2023  TSH     0.40 - 4.50 mIU/L 0.88   T4,Free(Direct)     0.8 - 1.8 ng/dL 1.0   Triiodothyronine,Free,Serum     2.3 - 4.2 pg/mL 3.0       10/16/23 THYROID  ULTRASOUND  Incidental on CT. Thyroid  nodules incidentally noted on thoracic spine CT performed 09/25/2023   COMPARISON:  Cervical spine CT-09/25/2023   FINDINGS: Parenchymal Echotexture: Mildly heterogenous   Isthmus: Normal in size measuring 0.3 cm in diameter   Right lobe: Borderline enlarged measuring 6.5 x 2.3 x 2.0 cm   Left lobe: Borderline enlarged measuring 7.7 x 2.5 x 2.0 cm   _________________________________________________________   Estimated total number of nodules >/= 1 cm: 2   Number of spongiform nodules >/=  2 cm not described below (TR1): 0   Number of mixed cystic and solid nodules >/= 1.5 cm not described below (TR2): 0   _________________________________________________________   There is a punctate (0.6 cm) hypoechoic nodule within mid, medial aspect of  the right lobe of the thyroid  (labeled 1), which does not meet criteria to recommend percutaneous sampling or continued dedicated follow-up.   _________________________________________________________   Nodule # 2:   Location: Right; Inferior   Maximum size: 2.5 cm; Other 2 dimensions: 2.2 x 1.7 cm   Composition: solid/almost  completely solid (2)   Echogenicity: isoechoic (1)   Shape: not taller-than-wide (0)   Margins: ill-defined (0)   Echogenic foci: none (0)   ACR TI-RADS total points: 3.   ACR TI-RADS risk category: TR3 (3 points).   ACR TI-RADS recommendations:   **Given size (>/= 2.5 cm) and appearance, fine needle aspiration of this mildly suspicious nodule should be considered based on TI-RADS criteria.   _________________________________________________________   There is a punctate (0.8 cm) spongiform/benign-appearing nodule within the superior pole of the left lobe of the thyroid  (labeled 3) which does not meet criteria to recommend percutaneous sampling or continued dedicated follow-up.   _________________________________________________________   Nodule # 4:   Location: Left; Inferior - correlates with the questioned nodule seen on preceding cervical spine CT   Maximum size: 3.1 cm; Other 2 dimensions: 2.2 x 1.8 cm   Composition: cannot determine (2)   Echogenicity: hypoechoic (2)   Shape: not taller-than-wide (0)   Margins: smooth (0)   Echogenic foci: none (0)   ACR TI-RADS total points: 4.   ACR TI-RADS risk category: TR4 (4-6 points).   ACR TI-RADS recommendations:   **Given size (>/= 1.5 cm) and appearance, fine needle aspiration of this moderately suspicious nodule should be considered based on TI-RADS criteria.   _________________________________________________________   IMPRESSION: 1. Thyromegaly with findings suggestive of multinodular goiter. 2. Nodules labeled #2 and #4 both meet imaging criteria to recommend percutaneous sampling as indicated. (Note, nodule labeled #4 correlates with the questioned nodule seen on preceding cervical spine CT). 3. None of the additional thyroid  nodules meet imaging criteria to recommend percutaneous sampling or continued dedicated follow-up.  Physical Exam  BP (!) 160/90   Pulse (!) 111   Ht 5' 1 (1.549 m)    Wt 249 lb (112.9 kg)   SpO2 98%   BMI 47.05 kg/m  Constitutional: well developed, well nourished Head: normocephalic, atraumatic, no exophthalmos Eyes: sclera anicteric, no redness Neck: + thyromegaly, no thyroid  tenderness; + nodule  Lungs: normal respiratory effort Neurology: alert and oriented, no fine hand tremor Skin: dry, no appreciable rashes Musculoskeletal: no appreciable defects Psychiatric: normal mood and affect  Allergies Allergies  Allergen Reactions   Hydrochlorothiazide  Rash   Sulfonamide Derivatives Rash    Current Medications Patient's Medications  New Prescriptions   No medications on file  Previous Medications   ACCU-CHEK GUIDE TEST TEST STRIP    USE TO CHECK BLOOD SUGAR 3 TIMES DAILY   ACCU-CHEK SOFTCLIX LANCETS LANCETS    CHECK BLOOD SUGAR 3 TIMES DAILY   ALENDRONATE  (FOSAMAX ) 70 MG TABLET    TAKE 1 TABLET BY MOUTH WEEKLY  WITH 8 OZ OF PLAIN WATER  30  MINUTES BEFORE FIRST FOOD, DRINK OR MEDS. STAY UPRIGHT FOR 30  MINS   ALPRAZOLAM  (XANAX ) 0.5 MG TABLET    Take 0.5 mg by mouth 3 (three) times daily as needed for anxiety.    AMPHETAMINE -DEXTROAMPHETAMINE  (ADDERALL) 15 MG TABLET    Take 15 mg by mouth 2 (two) times daily.   ATORVASTATIN  (LIPITOR) 40 MG TABLET    TAKE 1 TABLET BY MOUTH DAILY   BLOOD GLUCOSE CALIBRATION (ACCU-CHEK GUIDE CONTROL) LIQD    1 each  by In Vitro route daily as needed.   BLOOD GLUCOSE MONITORING SUPPL (ACCU-CHEK GUIDE ME) W/DEVICE KIT    USE IN THE MORNING , AT NOON,  AND AT BEDTIME   BUPROPION  (WELLBUTRIN  XL) 150 MG 24 HR TABLET    TAKE 1 TABLET BY MOUTH  DAILY   CALCIUM -VITAMIN D  PO    Take 600 mg by mouth 2 (two) times a day.    DICLOFENAC  SODIUM (VOLTAREN ) 1 % GEL    Apply 2 g topically 4 (four) times daily as needed.   DULOXETINE  (CYMBALTA ) 60 MG CAPSULE    TAKE 2 CAPSULES BY MOUTH  DAILY   GABAPENTIN  (NEURONTIN ) 600 MG TABLET    TAKE ONE-HALF TABLET BY MOUTH IN THE MORNING AND AFTER LUNCH THEN TAKE 2 TABLETS BY MOUTH AT  BEDTIME    IBUPROFEN  (ADVIL ) 800 MG TABLET    TAKE 1 TABLET BY MOUTH EVERY 8  HOURS AS NEEDED   METFORMIN  (GLUCOPHAGE -XR) 500 MG 24 HR TABLET    Take 1 tablet (500 mg total) by mouth daily with breakfast.   MONTELUKAST  (SINGULAIR ) 10 MG TABLET    TAKE 1 TABLET BY MOUTH AT  BEDTIME   MULTIPLE VITAMIN (MULTIVITAMIN) TABLET    Take 1 tablet by mouth daily.   OMEGA-3 FATTY ACIDS (FISH OIL) 1200 MG CAPS    Take 1,200 mg by mouth daily.    PANTOPRAZOLE  (PROTONIX ) 40 MG TABLET    TAKE 1 TABLET BY MOUTH DAILY   TRAZODONE  (DESYREL ) 50 MG TABLET    TAKE 1/2 TO 1 TABLET BY MOUTH AT BEDTIME AS NEEDED FOR SLEEP   VITAMIN D , ERGOCALCIFEROL , (DRISDOL ) 1.25 MG (50000 UNIT) CAPS CAPSULE    Take 1 capsule (50,000 Units total) by mouth every 7 (seven) days.  Modified Medications   No medications on file  Discontinued Medications   No medications on file    Past Medical History Past Medical History:  Diagnosis Date   Anxiety    Arthritis    Contact lens/glasses fitting    wears contacts or glasses   Depression    Diabetes mellitus without complication (HCC)    No meds   Diverticulosis    Family history of adverse reaction to anesthesia    pts sister has severe N/V   Fibromyalgia    Gastritis    GERD (gastroesophageal reflux disease)    Hemorrhoid    Hiatal hernia    neuropathy   Migraine    Neuropathy    Panic attacks    Pneumonia    as an infant    Past Surgical History Past Surgical History:  Procedure Laterality Date   ANTERIOR CERVICAL DECOMP/DISCECTOMY FUSION N/A 09/12/2014   Procedure: Evacuation of cervical hematoma;  Surgeon: Victory DELENA Gunnels, Odom;  Location: [facility];  Service: Neurosurgery;  Laterality: N/A;   ANTERIOR CERVICAL DECOMP/DISCECTOMY FUSION N/A 09/12/2014   Procedure: CERVICAL FOUR-FIVE, CERVICAL FIVE-SIX, CERVICAL SIX-SEVEN ANTERIOR CERVICAL DECOMPRESSION/DISCECTOMY FUSION;  Surgeon: Victory DELENA Gunnels, Odom;  Location: [facility];  Service: Neurosurgery;  Laterality: N/A;    BUNIONECTOMY     CARPAL TUNNEL RELEASE  2002   right   COLONOSCOPY     JOINT REPLACEMENT Left    SHOULDER ARTHROSCOPY WITH ROTATOR CUFF REPAIR  2010   right   TOTAL HIP ARTHROPLASTY Left 11/20/2017   Procedure: LEFT TOTAL HIP ARTHROPLASTY ANTERIOR APPROACH WITH BONE GRAFTING AND ACETABULAR SCREWS;  Surgeon: Yvone Rush, Odom;  Location: [facility];  Service: Orthopedics;  Laterality: Left;  TOTAL KNEE ARTHROPLASTY Right 03/11/2019   Procedure: TOTAL KNEE ARTHROPLASTY;  Surgeon: Yvone Rush, Odom;  Location: [facility];  Service: Orthopedics;  Laterality: Right;   TRIGGER FINGER RELEASE Right 03/23/2013   Procedure: RELEASE TRIGGER FINGER/A-1 PULLEY RIGHT RING FINGER;  Surgeon: Arley JONELLE Curia, Odom;  Location: [facility];  Service: Orthopedics;  Laterality: Right;   TUBAL LIGATION     UPPER GI ENDOSCOPY      Family History family history includes Alcohol abuse in her maternal uncle and paternal uncle; Anxiety disorder in her sister; Breast cancer in her maternal grandmother; Diabetes in her maternal aunt, paternal aunt, and sister; Heart disease in her brother; Ovarian cancer in her cousin.  Social History Social History   Socioeconomic History   Marital status: Divorced    Spouse name: Not on file   Number of children: 1   Years of education: Not on file   Highest education level: Bachelor's degree (e.g., BA, AB, BS)  Occupational History    Employer: [employer]  Tobacco Use   Smoking status: Never   Smokeless tobacco: Never  Vaping Use   Vaping status: Never Used  Substance and Sexual Activity   Alcohol use: No    Alcohol/week: 0.0 standard drinks of alcohol   Drug use: No   Sexual activity: Yes  Other Topics Concern   Not on file  Social History Narrative   Lives alone in a one story home.  Has one child.     Currently not working.     Worked for [employer].     Social Drivers of Corporate investment banker Strain: Low Risk  (11/05/2023)   Overall Financial Resource  Strain (CARDIA)    Difficulty of Paying Living Expenses: Not very hard  Food Insecurity: No Food Insecurity (11/05/2023)   Hunger Vital Sign    Worried About Running Out of Food in the Last Year: Never true    Ran Out of Food in the Last Year: Never true  Transportation Needs: No Transportation Needs (11/05/2023)   PRAPARE - Administrator, Civil Service (Medical): No    Lack of Transportation (Non-Medical): No  Physical Activity: Insufficiently Active (11/05/2023)   Exercise Vital Sign    Days of Exercise per Week: 2 days    Minutes of Exercise per Session: 20 min  Stress: Stress Concern Present (11/05/2023)   [employer] of Occupational Health - Occupational Stress Questionnaire    Feeling of Stress : To some extent  Social Connections: Socially Isolated (11/05/2023)   Social Connection and Isolation Panel    Frequency of Communication with Friends and Family: More than three times a week    Frequency of Social Gatherings with Friends and Family: Patient declined    Attends Religious Services: Never    Database administrator or Organizations: No    Attends Engineer, structural: Not on file    Marital Status: Divorced  Intimate Partner Violence: Not At Risk (12/11/2020)   Humiliation, Afraid, Rape, and Kick questionnaire    Fear of Current or Ex-Partner: No    Emotionally Abused: No    Physically Abused: No    Sexually Abused: No    Laboratory Investigations Lab Results  Component Value Date   TSH 0.88 12/16/2023   TSH 0.86 10/01/2023   TSH 0.74 05/20/2016   FREET4 1.0 12/16/2023     No results found for: TSI   No components found for: TRAB   Lab Results  Component Value Date   CHOL 124 05/19/2023   Lab Results  Component Value Date   HDL 48.50 05/19/2023   Lab Results  Component Value Date   LDLCALC 61 05/19/2023   Lab Results  Component Value Date   TRIG 72.0 05/19/2023   Lab Results  Component Value Date   CHOLHDL 3 05/19/2023    Lab Results  Component Value Date   CREATININE 0.75 05/19/2023   Lab Results  Component Value Date   GFR 81.20 05/19/2023      Component Value Date/Time   NA 142 05/19/2023 0929   NA 141 07/31/2014 0834   K 4.4 05/19/2023 0929   CL 104 05/19/2023 0929   CO2 30 05/19/2023 0929   GLUCOSE 117 (H) 05/19/2023 0929   BUN 21 05/19/2023 0929   BUN 15 07/31/2014 0834   CREATININE 0.75 05/19/2023 0929   CREATININE 0.64 09/16/2019 1630   CALCIUM  10.4 05/19/2023 0929   PROT 6.9 05/19/2023 0929   PROT 6.8 07/31/2014 0834   ALBUMIN 4.3 05/19/2023 0929   ALBUMIN 4.0 07/31/2014 0834   AST 20 05/19/2023 0929   ALT 18 05/19/2023 0929   ALKPHOS 93 05/19/2023 0929   BILITOT 0.4 05/19/2023 0929   GFRNONAA >60 03/04/2019 1052   GFRAA >60 03/04/2019 1052      Latest Ref Rng & Units 05/19/2023    9:29 AM 11/19/2022    9:00 AM 05/09/2022    8:25 AM  BMP  Glucose 70 - 99 mg/dL 882  97  814   BUN 6 - 23 mg/dL 21  15  13    Creatinine 0.40 - 1.20 mg/dL 9.24  9.24  9.24   Sodium 135 - 145 mEq/L 142  140  140   Potassium 3.5 - 5.1 mEq/L 4.4  4.2  3.8   Chloride 96 - 112 mEq/L 104  104  102   CO2 19 - 32 mEq/L 30  27  29    Calcium  8.4 - 10.5 mg/dL 89.5  9.3  9.3        Component Value Date/Time   WBC 12.8 (H) 03/12/2019 0240   RBC 4.34 03/12/2019 0240   HGB 12.6 03/12/2019 0240   HCT 40.1 03/12/2019 0240   PLT 353 03/12/2019 0240   MCV 92.4 03/12/2019 0240   MCH 29.0 03/12/2019 0240   MCHC 31.4 03/12/2019 0240   RDW 13.6 03/12/2019 0240   RDW 15.0 07/31/2014 0834   LYMPHSABS 2.1 03/04/2019 1052   LYMPHSABS 2.5 07/31/2014 0834   MONOABS 0.5 03/04/2019 1052   EOSABS 0.4 03/04/2019 1052   EOSABS 0.3 07/31/2014 0834   BASOSABS 0.1 03/04/2019 1052   BASOSABS 0.0 07/31/2014 0834      Parts of this note may have been dictated using voice recognition software. There may be variances in spelling and vocabulary which are unintentional. Not all errors are proofread. Please notify the  dino if any discrepancies are noted or if the meaning of any statement is not clear.

## 2024-05-06 ENCOUNTER — Ambulatory Visit
Admission: RE | Admit: 2024-05-06 | Discharge: 2024-05-06 | Disposition: A | Discharge status: Home / Self Care | Source: Ambulatory Visit | Attending: "Endocrinology | Admitting: "Endocrinology

## 2024-05-06 ENCOUNTER — Other Ambulatory Visit (HOSPITAL_COMMUNITY)
Admission: RE | Admit: 2024-05-06 | Discharge: 2024-05-06 | Disposition: A | Discharge status: Home / Self Care | Source: Ambulatory Visit | Attending: "Endocrinology | Admitting: "Endocrinology

## 2024-05-06 DIAGNOSIS — E041 Nontoxic single thyroid nodule: Secondary | ICD-10-CM | POA: Diagnosis not present

## 2024-05-06 DIAGNOSIS — E042 Nontoxic multinodular goiter: Secondary | ICD-10-CM

## 2024-05-06 NOTE — Procedures (Signed)
 PROCEDURE SUMMARY:  Technically successful ultrasound guided thyroid  nodule FNA.  No immediate complications.  Patient tolerated well.  EBL = trace Specimens were sent to Pathology for analysis.  Please see full dictation in imaging section of Epic for procedure details.  Monseratt Ledin H Viha Kriegel PA-C 05/06/2024 3:07 PM

## 2024-05-09 LAB — CYTOLOGY - NON PAP

## 2024-05-11 ENCOUNTER — Ambulatory Visit: Payer: Self-pay | Admitting: "Endocrinology

## 2024-05-23 ENCOUNTER — Telehealth: Payer: Self-pay

## 2024-05-23 NOTE — Telephone Encounter (Signed)
 Copied from CRM #[id number]. Topic: Clinical - Request for Lab/Test Order >> May 23, 2024  2:45 PM Rosina BIRCH wrote: Reason for CRM: patient called wanting to know if she can add the bone density test to the mammogram [phone]

## 2024-05-24 DIAGNOSIS — Z1231 Encounter for screening mammogram for malignant neoplasm of breast: Secondary | ICD-10-CM | POA: Diagnosis not present

## 2024-05-24 LAB — HM MAMMOGRAPHY

## 2024-05-24 NOTE — Telephone Encounter (Signed)
 She had bone density in September 2024.  Bone densities are done every 2 years.  She is not due for one.

## 2024-05-24 NOTE — Telephone Encounter (Signed)
 Left message for Lisa Odom that she had her last Bone Density in 2024 and these are done every 2 years so she is not due until 05/2025.

## 2024-05-25 ENCOUNTER — Other Ambulatory Visit: Payer: Self-pay | Admitting: Family

## 2024-06-03 ENCOUNTER — Other Ambulatory Visit

## 2024-06-08 ENCOUNTER — Other Ambulatory Visit: Payer: Self-pay | Admitting: Family Medicine

## 2024-06-08 DIAGNOSIS — G894 Chronic pain syndrome: Secondary | ICD-10-CM

## 2024-06-09 ENCOUNTER — Telehealth: Payer: Self-pay | Admitting: Family Medicine

## 2024-06-09 DIAGNOSIS — E1159 Type 2 diabetes mellitus with other circulatory complications: Secondary | ICD-10-CM

## 2024-06-09 DIAGNOSIS — E559 Vitamin D deficiency, unspecified: Secondary | ICD-10-CM

## 2024-06-09 DIAGNOSIS — E042 Nontoxic multinodular goiter: Secondary | ICD-10-CM

## 2024-06-09 NOTE — Telephone Encounter (Signed)
-----   Message from Veva JINNY Ferrari sent at 06/09/2024  4:06 PM EDT ----- Regarding: Lab orders for Fri, 9.12.25 Lab orders for an appt next week

## 2024-06-10 ENCOUNTER — Other Ambulatory Visit

## 2024-06-14 ENCOUNTER — Ambulatory Visit: Payer: Self-pay | Admitting: Family Medicine

## 2024-06-14 ENCOUNTER — Ambulatory Visit: Admitting: Family Medicine

## 2024-06-14 ENCOUNTER — Encounter: Payer: Self-pay | Admitting: Family Medicine

## 2024-06-14 VITALS — BP 138/76 | HR 93 | Temp 98.6°F | Ht 61.0 in | Wt 151.4 lb

## 2024-06-14 DIAGNOSIS — E1159 Type 2 diabetes mellitus with other circulatory complications: Secondary | ICD-10-CM | POA: Diagnosis not present

## 2024-06-14 DIAGNOSIS — F332 Major depressive disorder, recurrent severe without psychotic features: Secondary | ICD-10-CM

## 2024-06-14 DIAGNOSIS — Z23 Encounter for immunization: Secondary | ICD-10-CM | POA: Diagnosis not present

## 2024-06-14 DIAGNOSIS — R413 Other amnesia: Secondary | ICD-10-CM | POA: Diagnosis not present

## 2024-06-14 DIAGNOSIS — E1169 Type 2 diabetes mellitus with other specified complication: Secondary | ICD-10-CM

## 2024-06-14 DIAGNOSIS — Z7984 Long term (current) use of oral hypoglycemic drugs: Secondary | ICD-10-CM | POA: Diagnosis not present

## 2024-06-14 DIAGNOSIS — E119 Type 2 diabetes mellitus without complications: Secondary | ICD-10-CM

## 2024-06-14 DIAGNOSIS — M79605 Pain in left leg: Secondary | ICD-10-CM

## 2024-06-14 DIAGNOSIS — E785 Hyperlipidemia, unspecified: Secondary | ICD-10-CM | POA: Diagnosis not present

## 2024-06-14 DIAGNOSIS — M79604 Pain in right leg: Secondary | ICD-10-CM

## 2024-06-14 DIAGNOSIS — E1140 Type 2 diabetes mellitus with diabetic neuropathy, unspecified: Secondary | ICD-10-CM

## 2024-06-14 DIAGNOSIS — M810 Age-related osteoporosis without current pathological fracture: Secondary | ICD-10-CM

## 2024-06-14 DIAGNOSIS — I152 Hypertension secondary to endocrine disorders: Secondary | ICD-10-CM

## 2024-06-14 DIAGNOSIS — Z0000 Encounter for general adult medical examination without abnormal findings: Secondary | ICD-10-CM | POA: Diagnosis not present

## 2024-06-14 DIAGNOSIS — E042 Nontoxic multinodular goiter: Secondary | ICD-10-CM | POA: Diagnosis not present

## 2024-06-14 LAB — VITAMIN D 25 HYDROXY (VIT D DEFICIENCY, FRACTURES): VITD: 36.37 ng/mL (ref 30.00–100.00)

## 2024-06-14 LAB — LIPID PANEL
Cholesterol: 118 mg/dL (ref 0–200)
HDL: 52.3 mg/dL (ref >39.00)
LDL Cholesterol: 50 mg/dL (ref 0–99)
NonHDL: 65.68
Total CHOL/HDL Ratio: 2
Triglycerides: 78 mg/dL (ref 0.0–149.0)
VLDL: 15.6 mg/dL (ref 0.0–40.0)

## 2024-06-14 LAB — HEMOGLOBIN A1C: Hgb A1c MFr Bld: 7.3 % — ABNORMAL HIGH (ref 4.6–6.5)

## 2024-06-14 LAB — VITAMIN B12: Vitamin B-12: >1500 pg/mL — ABNORMAL HIGH (ref 211–911)

## 2024-06-14 LAB — MICROALBUMIN / CREATININE URINE RATIO
Creatinine,U: 111.5 mg/dL
Microalb Creat Ratio: 14.2 mg/g (ref 0.0–30.0)
Microalb, Ur: 1.6 mg/dL (ref 0.0–1.9)

## 2024-06-14 LAB — CK: Total CK: 96 U/L (ref 17–177)

## 2024-06-14 LAB — HM DIABETES FOOT EXAM

## 2024-06-14 MED ORDER — ACCU-CHEK GUIDE CONTROL VI LIQD: 1.0000 | Freq: Every day | 3 refills | Status: AC | PRN

## 2024-06-14 NOTE — Assessment & Plan Note (Addendum)
 Chronic, stable  on brief MMSE in office today ( only short term recall diminished) but bothersome to patient.  Neg lab eval in past. Will recheck B12 and Vit D.  Possible mood related or med side effects.  Will refer to neuro for further evaluation.

## 2024-06-14 NOTE — Assessment & Plan Note (Signed)
 Chronic, at goal in office today on no medication.

## 2024-06-14 NOTE — Assessment & Plan Note (Addendum)
 Chronic,  Followed by Dr. Vincente.

## 2024-06-14 NOTE — Patient Instructions (Addendum)
 Stop  fosamax  given completed 5 years.  Please stop at the lab to have labs drawn.  Stop atorvastatin  for 1-2 weeks to see if leg pain better... follow up if not improving.  Referral sent for neuro eval..

## 2024-06-14 NOTE — Assessment & Plan Note (Signed)
 Chronic,   Due for re-eval.  Metformin  XR 500 mg p.o. daily

## 2024-06-14 NOTE — Assessment & Plan Note (Addendum)
 Acute ongoing 3-4 months.  Hold atorvastatin  for 1-2 week as possible SE.. follow up if leg pain not better.  Check CK.  Maybe related to back issues

## 2024-06-14 NOTE — Assessment & Plan Note (Addendum)
 Improved on Fosamax .  Plan stop  given  now at year 5.  Recheck DEXA in 2026

## 2024-06-14 NOTE — Assessment & Plan Note (Signed)
 Due for reevaluation

## 2024-06-14 NOTE — Addendum Note (Signed)
 Addended by: WENDELL ARLAND RAMAN on: 06/14/2024 11:19 AM   Modules accepted: Orders

## 2024-06-14 NOTE — Assessment & Plan Note (Signed)
 Multinodular goiter followed by endocrinology. Status post thyroid  biopsy May 06, 2024 Cytology showed 1 nodule benign the other insufficient sample.  Planning to monitor.

## 2024-06-14 NOTE — Progress Notes (Signed)
 Patient ID: Lisa Odom, female    DOB: [birth year], 69 y.o.   MRN: [id number]  This visit was conducted in person.  BP 138/76   Pulse 93   Temp 98.6 F (37 C) (Temporal)   Ht 5' 1 (1.549 m)   Wt 151 lb 6 oz (68.7 kg)   SpO2 96%   BMI 28.60 kg/m    CC:  Chief Complaint  Patient presents with   Medicare Wellness    Subjective:   HPI: Lisa Odom is a 68 y.o. female presenting on 06/14/2024 for Medicare Wellness  The patient presents for annual medicare wellness, complete physical and review of chronic health problems. He/She also has the following acute concerns today: none   I have personally reviewed the Medicare Annual Wellness questionnaire and have noted 1. The patient's medical and social history 2. Their use of alcohol, tobacco or illicit drugs 3. Their current medications and supplements 4. The patient's functional ability including ADL's, fall risks, home safety risks and hearing or visual             impairment. 5. Diet and physical activities 6. Evidence for depression or mood disorders 7.         Updated provider list Cognitive evaluation was performed and recorded on pt medicare questionnaire form. The patients weight, height, BMI and visual acuity have been recorded in the chart   I have made referrals, counseling and provided education to the patient based review of the above and I have provided the pt with a written personalized care plan for preventive services.    Documentation of this information was scanned into the electronic record under the media tab.  Hearing Screening  Method: Audiometry   500Hz  1000Hz  2000Hz  4000Hz   Right ear 20 20 20 20   Left ear 20 20 20 20   Vision Screening - Comments:: Eye Exam at [facility] 03/03/2024  No falls in last 12 months.    Advance directives and end of life planning reviewed in detail with patient and documented in EMR. Patient given handout on advance care directives if needed. HCPOA and  living will updated if needed.  Flowsheet Row Office Visit from 06/14/2024 in [facility]  PHQ-2 Total Score 2   Diabetes:   On metomfrin XR 500 mg daily,  slightly worse this time... no diet change Lab Results  Component Value Date   HGBA1C 6.7 (A) 02/23/2024  Using medications without difficulties: Hypoglycemic episodes: Hyperglycemic episodes: Feet problems: no ulcers Blood Sugars averaging:   107-156 eye exam within last year: yes.. per pt 02/2024  Elevated Cholesterol: previously well controlled on atorvastatin  40 mg daily Lab Results  Component Value Date   CHOL 124 05/19/2023   HDL 48.50 05/19/2023   LDLCALC 61 05/19/2023   TRIG 72.0 05/19/2023   CHOLHDL 3 05/19/2023  Using medications without problems: none Muscle aches:  none Diet compliance: Exercise: minimal Other complaints:  Hypertension:   Not on a medication.. BP Readings from Last 3 Encounters:  06/14/24 138/76  04/13/24 (!) 160/90  02/23/24 138/80  Using medication without problems or lightheadedness:  none Chest pain with exertion: none Edema: occ Short of breath:none Average home BPs: 130/80 Other issues:  Chronic insomnia Sleeping better at night.  Some issue with restless leg.     Moderate control depression on wellbutrin  XL  150 mg and cymbalta  60 mg daily. Followed by Dr. Vincente.    Fibromyalgia flaring up with recent  worsening mood. Meloxicam  does not help. Ibuprofen  800  Multinodular goiter followed by endocrinology. Status post thyroid  biopsy May 06, 2024 Cytology showed 1 nodule benign the other insufficient sample.  Planning to monitor.  Relevant past medical, surgical, family and social history reviewed and updated as indicated. Interim medical history since our last visit reviewed. Allergies and medications reviewed and updated. Outpatient Medications Prior to Visit  Medication Sig Dispense Refill   ACCU-CHEK GUIDE TEST test strip USE TO CHECK  BLOOD SUGAR 3 TIMES DAILY 300 strip 2   Accu-Chek Softclix Lancets lancets CHECK BLOOD SUGAR 3 TIMES DAILY 300 each 2   ALPRAZolam  (XANAX ) 0.5 MG tablet Take 0.5 mg by mouth 3 (three) times daily as needed for anxiety.      amphetamine -dextroamphetamine  (ADDERALL) 15 MG tablet Take 15 mg by mouth 2 (two) times daily.     atorvastatin  (LIPITOR) 40 MG tablet TAKE 1 TABLET BY MOUTH DAILY 100 tablet 0   Blood Glucose Monitoring Suppl (ACCU-CHEK GUIDE ME) w/Device KIT USE IN THE MORNING , AT NOON,  AND AT BEDTIME 1 kit 0   buPROPion  (WELLBUTRIN  XL) 150 MG 24 hr tablet TAKE 1 TABLET BY MOUTH  DAILY 90 tablet 2   CALCIUM -VITAMIN D  PO Take 600 mg by mouth 2 (two) times a day.      diclofenac  Sodium (VOLTAREN ) 1 % GEL Apply 2 g topically 4 (four) times daily as needed.     DULoxetine  (CYMBALTA ) 60 MG capsule TAKE 2 CAPSULES BY MOUTH  DAILY 180 capsule 2   gabapentin  (NEURONTIN ) 600 MG tablet TAKE ONE-HALF TABLET BY MOUTH IN THE MORNING AND AFTER LUNCH THEN TAKE 2 TABLETS BY MOUTH AT  BEDTIME 300 tablet 2   ibuprofen  (ADVIL ) 800 MG tablet TAKE 1 TABLET BY MOUTH EVERY 8  HOURS AS NEEDED 90 tablet 11   metFORMIN  (GLUCOPHAGE -XR) 500 MG 24 hr tablet Take 1 tablet (500 mg total) by mouth daily with breakfast. 90 tablet 3   montelukast  (SINGULAIR ) 10 MG tablet TAKE 1 TABLET BY MOUTH AT  BEDTIME 100 tablet 0   Multiple Vitamin (MULTIVITAMIN) tablet Take 1 tablet by mouth daily.     Omega-3 Fatty Acids (FISH OIL) 1200 MG CAPS Take 1,200 mg by mouth daily.      pantoprazole  (PROTONIX ) 40 MG tablet TAKE 1 TABLET BY MOUTH DAILY 100 tablet 0   traZODone  (DESYREL ) 50 MG tablet TAKE 1/2 TO 1 TABLET BY MOUTH AT BEDTIME AS NEEDED FOR SLEEP 100 tablet 1   Vitamin D , Ergocalciferol , (DRISDOL ) 1.25 MG (50000 UNIT) CAPS capsule Take 1 capsule (50,000 Units total) by mouth every 7 (seven) days. 12 capsule 0   alendronate  (FOSAMAX ) 70 MG tablet TAKE 1 TABLET BY MOUTH WEEKLY  WITH 8 OZ OF PLAIN WATER  30  MINUTES BEFORE FIRST  FOOD, DRINK OR MEDS. STAY UPRIGHT FOR 30  MINS 12 tablet 3   Blood Glucose Calibration (ACCU-CHEK GUIDE CONTROL) LIQD 1 each by In Vitro route daily as needed. 1 each 3   No facility-administered medications prior to visit.     Per HPI unless specifically indicated in ROS section below Review of Systems  Constitutional:  Negative for fatigue, fever and unexpected weight change.  HENT:  Negative for congestion, ear pain, sinus pressure, sneezing, sore throat and trouble swallowing.   Eyes:  Negative for pain and itching.  Respiratory:  Negative for cough, shortness of breath and wheezing.   Cardiovascular:  Negative for chest pain, palpitations and leg swelling.  Gastrointestinal:  Negative for abdominal pain, blood in stool, constipation, diarrhea and nausea.  Genitourinary:  Negative for difficulty urinating, dysuria, hematuria, menstrual problem and vaginal discharge.  Skin:  Negative for rash.  Neurological:  Negative for syncope, weakness, light-headedness, numbness and headaches.  Psychiatric/Behavioral:  Negative for confusion and dysphoric mood. The patient is not nervous/anxious.    Objective:  BP 138/76   Pulse 93   Temp 98.6 F (37 C) (Temporal)   Ht 5' 1 (1.549 m)   Wt 151 lb 6 oz (68.7 kg)   SpO2 96%   BMI 28.60 kg/m   Wt Readings from Last 3 Encounters:  06/14/24 151 lb 6 oz (68.7 kg)  04/13/24 249 lb (112.9 kg)  02/23/24 147 lb 6 oz (66.8 kg)      Physical Exam Vitals and nursing note reviewed.  Constitutional:      General: She is not in acute distress.    Appearance: Normal appearance. She is well-developed. She is not ill-appearing or toxic-appearing.  HENT:     Head: Normocephalic.     Right Ear: Hearing, tympanic membrane, ear canal and external ear normal.     Left Ear: Hearing, tympanic membrane, ear canal and external ear normal.     Nose: Nose normal.  Eyes:     General: Lids are normal. Lids are everted, no foreign bodies appreciated.      Conjunctiva/sclera: Conjunctivae normal.     Pupils: Pupils are equal, round, and reactive to light.  Neck:     Thyroid : No thyroid  mass or thyromegaly.     Vascular: No carotid bruit.     Trachea: Trachea normal.  Cardiovascular:     Rate and Rhythm: Normal rate and regular rhythm.     Heart sounds: Normal heart sounds, S1 normal and S2 normal. No murmur heard.    No gallop.  Pulmonary:     Effort: Pulmonary effort is normal. No respiratory distress.     Breath sounds: Normal breath sounds. No wheezing, rhonchi or rales.  Abdominal:     General: Bowel sounds are normal. There is no distension or abdominal bruit.     Palpations: Abdomen is soft. There is no fluid wave or mass.     Tenderness: There is no abdominal tenderness. There is no guarding or rebound.     Hernia: No hernia is present.  Genitourinary:    Labia:        Right: Lesion present.        Left: Lesion present.   Musculoskeletal:     Cervical back: Normal range of motion and neck supple.  Lymphadenopathy:     Cervical: No cervical adenopathy.  Skin:    General: Skin is warm and dry.     Findings: No rash.  Neurological:     Mental Status: She is alert.     Cranial Nerves: No cranial nerve deficit.     Sensory: No sensory deficit.  Psychiatric:        Mood and Affect: Mood is not anxious or depressed.        Speech: Speech normal.        Behavior: Behavior normal. Behavior is cooperative.        Judgment: Judgment normal.    Diabetic foot exam: Normal inspection No skin breakdown No calluses  Normal DP pulses Normal sensation to light touch and monofilament Nails normal     Results for orders placed or performed in visit on 06/14/24  HM DIABETES FOOT EXAM  Collection Time: 06/14/24 12:00 AM  Result Value Ref Range   HM Diabetic Foot Exam done      COVID 19 screen:  No recent travel or known exposure to COVID19 The patient denies respiratory symptoms of COVID 19 at this time. The importance of  social distancing was discussed today.   Assessment and Plan   The patient's preventative maintenance and recommended screening tests for an annual wellness exam were reviewed in full today. Brought up to date unless services declined.  Counselled on the importance of diet, exercise, and its role in overall health and mortality. The patient's FH and SH was reviewed, including their home life, tobacco status, and drug and alcohol status.   Vaccine:  Due for  flu,  uptodate td PCV23, shingrix.  PAP 2016 nml pap, neg HPV. Asymptomatic. Mammogram every 1-2 years. Done 04/2024 per pt nml COLON:  2019 Dr. Shila repeat in 10 years.  Hep C done  DEXA 12/2020  improved from osteoporosis to osteopenia on fosamax , 05/2023 improved to osteopenia, stop fosamax  2025, repeat DEX 2026  Problem List Items Addressed This Visit     Bilateral leg pain    Acute ongoing 3-4 months.  Hold atorvastatin  for 1-2 week as possible SE.. follow up if leg pain not better.      Relevant Orders   CK   Hyperlipidemia associated with type 2 diabetes mellitus (HCC) (Chronic)   Due for reevaluation.      Hypertension associated with diabetes (HCC) (Chronic)   Chronic, at goal in office today on no medication.      Major depressive disorder, recurrent episode, severe (HCC) (Chronic)   Chronic,  Followed by Dr. Vincente.       Memory loss    Chronic, stable but bothersome to patient.  Neg lab eval in past.  Possible mood related or med side ffects.  Will refer to neuro for further evaluation.      Relevant Orders   Vitamin B12   Ambulatory referral to Neurology   Multinodular goiter   Multinodular goiter followed by endocrinology. Status post thyroid  biopsy May 06, 2024 Cytology showed 1 nodule benign the other insufficient sample.  Planning to monitor.      Osteoporosis   Improved on Fosamax .  Plan stop  given  now at year 5.  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  Metformin  XR 500 mg p.o. daily      Relevant Medications   Blood Glucose Calibration (ACCU-CHEK GUIDE CONTROL) LIQD   Other Relevant Orders   Microalbumin / creatinine urine ratio   Other Visit Diagnoses       Medicare annual wellness visit, subsequent    -  Primary     Diabetes mellitus treated with oral medication (HCC)         Controlled type 2 diabetes mellitus with diabetic neuropathy, without long-term current use of insulin  (HCC)       Relevant Medications   Blood Glucose Calibration (ACCU-CHEK GUIDE CONTROL) LIQD         Greig Ring, MD

## 2024-06-15 ENCOUNTER — Ambulatory Visit: Payer: Self-pay | Admitting: Family Medicine

## 2024-06-18 LAB — COMPREHENSIVE METABOLIC PANEL WITH GFR
ALT: 15 U/L (ref 0–35)
AST: 14 U/L (ref 0–37)
Albumin: 4.3 g/dL (ref 3.5–5.2)
Alkaline Phosphatase: 71 U/L (ref 39–117)
BUN: 20 mg/dL (ref 6–23)
CO2: 27 meq/L (ref 19–32)
Calcium: 10.5 mg/dL (ref 8.4–10.5)
Chloride: 106 meq/L (ref 96–112)
Creatinine, Ser: 0.62 mg/dL (ref 0.40–1.20)
GFR: 90.14 mL/min (ref >60.00)
Glucose, Bld: 122 mg/dL — ABNORMAL HIGH (ref 70–99)
Potassium: 3.5 meq/L (ref 3.5–5.1)
Sodium: 139 meq/L (ref 135–145)
Total Bilirubin: 0.5 mg/dL (ref 0.2–1.2)
Total Protein: 7.2 g/dL (ref 6.0–8.3)

## 2024-07-08 ENCOUNTER — Other Ambulatory Visit: Payer: Self-pay | Admitting: Family Medicine

## 2024-07-08 MED ORDER — IBUPROFEN 800 MG PO TABS: 800.0000 mg | ORAL_TABLET | Freq: Three times a day (TID) | ORAL | 11 refills | Status: AC | PRN

## 2024-07-08 NOTE — Telephone Encounter (Signed)
 Last office visit 06/14/2024 for CPE.  Last refilled 06/11/2023 for #90 with 11 refills.  No future appointments with PCP.

## 2024-07-11 NOTE — Progress Notes (Signed)
 Lisa Odom                                          MRN: [id number]   07/11/2024   The [facility] Quality Team Specialist reviewed this patient medical record for the purposes of chart review for care gap closure. The following were reviewed: abstraction for care gap closure-glycemic status assessment and kidney health evaluation for diabetes:eGFR  and uACR.    [facility] Quality Team

## 2024-08-01 ENCOUNTER — Encounter: Payer: Self-pay | Admitting: Family Medicine

## 2024-08-09 ENCOUNTER — Telehealth: Payer: Self-pay | Admitting: Family Medicine

## 2024-08-09 NOTE — Telephone Encounter (Signed)
 labs

## 2024-08-09 NOTE — Telephone Encounter (Signed)
 Surgical clearance form printed. Left message for Lisa Odom to call back and let us  know if she is having any new chest pain or shortness of breath since she was seen at her wellness visit.  Recent labs have been done.  I should be able to complete the preop evaluation without an appointment.   I will also sent patient a MyChart message.

## 2024-08-23 ENCOUNTER — Telehealth: Payer: Self-pay | Admitting: *Deleted

## 2024-08-23 NOTE — Telephone Encounter (Signed)
 Last office note and labs faxed to [facility] at [phone] as requested.

## 2024-08-23 NOTE — Telephone Encounter (Signed)
 Copied from CRM #[id number]. Topic: General - Other >> Aug 23, 2024  3:34 PM Rosina BIRCH wrote: Reason for CRM: judy from [facility] called stating they have the clearance form but they need the most recent office notes and labs too. Patient is coming in for a pre-op 12/2 and they need it before then [phone]

## 2024-09-05 ENCOUNTER — Encounter: Payer: Self-pay | Admitting: Family Medicine

## 2024-09-12 ENCOUNTER — Other Ambulatory Visit: Payer: Self-pay | Admitting: Family Medicine

## 2024-09-12 DIAGNOSIS — G894 Chronic pain syndrome: Secondary | ICD-10-CM

## 2024-09-19 ENCOUNTER — Other Ambulatory Visit: Payer: Self-pay | Admitting: Family Medicine

## 2024-09-28 ENCOUNTER — Ambulatory Visit: Admitting: Neurology

## 2024-09-28 ENCOUNTER — Encounter: Payer: Self-pay | Admitting: Neurology

## 2024-09-28 VITALS — BP 142/79 | HR 91 | Ht 61.0 in | Wt 142.0 lb

## 2024-09-28 DIAGNOSIS — R4189 Other symptoms and signs involving cognitive functions and awareness: Secondary | ICD-10-CM | POA: Diagnosis not present

## 2024-09-28 DIAGNOSIS — F909 Attention-deficit hyperactivity disorder, unspecified type: Secondary | ICD-10-CM | POA: Diagnosis not present

## 2024-09-28 NOTE — Progress Notes (Signed)
 "   [facility]  PATIENT: Lisa Odom DOB: [birth year]  REQUESTING CLINICIAN: Avelina Greig BRAVO, MD HISTORY FROM: Patient  REASON FOR VISIT: Memory loss, attention deficits    HISTORICAL  CHIEF COMPLAINT:  Chief Complaint  Patient presents with   New Patient (Initial Visit)    Room 12 Alone Memory loss  [place]oca completed: 27/30    HISTORY OF PRESENT ILLNESS:  Discussed the use of AI scribe software for clinical note transcription with the patient, who gave verbal consent to proceed.  Lisa Odom is a 68 year old female who presents with memory issues.  She experiences significant memory issues, including difficulty finding items like her phone and keys, despite using tools like [employer]. She often forgets recent conversations and struggles to remember the day of the week, leading to confusion about appointments. She relies on a calendar to keep track of events but still makes errors, such as mistaking the day of the week. She also has difficulty with focus and attention and takes Adderall, which is managed by her psychiatrist.  She experiences frequent falls, with notable incidents over the past year, including [place] fall at the airport and another while exiting a vehicle. These falls occur while she is awake and are attributed to tripping over objects she sees but does not register. Her last fall was approximately three to four months ago.  She has difficulty with daily activities such as cooking, which her sister now handles, and shopping, where she sometimes cannot find items. She also experiences episodes of disorientation while driving, occasionally needing to use GPS to return home. She manages her medications independently using a pill box and has set up auto-pay for bills to avoid missing payments.  No difficulty with self-care activities such as showering or toileting. No history of sleep apnea, stroke, seizures, or traumatic brain injury. No family  history of dementia. No difficulty remembering family members' names or finding words during conversations, although she sometimes loses track of conversations.  Her medical history includes fibromyalgia, for which she takes Wellbutrin . Recent lab work showed a normal B12 level and thyroid  function, with a slightly elevated A1c at 7.    TBI:   No past history of TBI Stroke:   no past history of stroke Seizures:   no past history of seizures Sleep:   no history of sleep apnea.  Mood: Yes, on Wellbutrin  for depression  Family history of Dementia: Denies  Functional status: independent in all ADLs Patient lives with with sister . Cooking: some Cleaning: some Shopping: some Bathing: no issues Toileting: no issues  Driving: yes, got lost going home Bills: autopay  Medications: with pill box  Ever left the stove on by accident?: no Forget how to use items around the house?: yes Getting lost going to familiar places?: yes Forgetting loved ones names?: no Word finding difficulty? no Sleep: difficult some times   OTHER MEDICAL CONDITIONS: Anxiety/Depression, ADHD, hypertension, hyperlipidemia, diabetes, fibromyalgia    REVIEW OF SYSTEMS: Full 14 system review of systems performed and negative with exception of: As noted in the HPI   ALLERGIES: Allergies[1]  HOME MEDICATIONS: Outpatient Medications Prior to Visit  Medication Sig Dispense Refill   ACCU-CHEK GUIDE TEST test strip USE TO CHECK BLOOD SUGAR 3 TIMES DAILY 300 strip 2   Accu-Chek Softclix Lancets lancets CHECK BLOOD SUGAR 3 TIMES DAILY 300 each 2   ALPRAZolam  (XANAX ) 0.5 MG tablet Take 0.5 mg by mouth 3 (three) times daily as needed for  anxiety.      amphetamine -dextroamphetamine  (ADDERALL) 15 MG tablet Take 15 mg by mouth 2 (two) times daily.     atorvastatin  (LIPITOR) 40 MG tablet TAKE 1 TABLET BY MOUTH DAILY 100 tablet 2   Blood Glucose Calibration (ACCU-CHEK GUIDE CONTROL) LIQD 1 each by In Vitro route daily as  needed. 1 each 3   Blood Glucose Monitoring Suppl (ACCU-CHEK GUIDE ME) w/Device KIT USE IN THE MORNING , AT NOON,  AND AT BEDTIME 1 kit 0   buPROPion  (WELLBUTRIN  XL) 150 MG 24 hr tablet TAKE 1 TABLET BY MOUTH  DAILY 90 tablet 2   CALCIUM -VITAMIN D  PO Take 600 mg by mouth 2 (two) times a day.      diclofenac  Sodium (VOLTAREN ) 1 % GEL Apply 2 g topically 4 (four) times daily as needed.     DULoxetine  (CYMBALTA ) 60 MG capsule TAKE 2 CAPSULES BY MOUTH  DAILY 180 capsule 2   gabapentin  (NEURONTIN ) 600 MG tablet TAKE ONE-HALF TABLET BY MOUTH IN THE MORNING AND AFTER LUNCH THEN TAKE 2 TABLETS BY MOUTH AT  BEDTIME 300 tablet 2   ibuprofen  (ADVIL ) 800 MG tablet Take 1 tablet (800 mg total) by mouth every 8 (eight) hours as needed. 90 tablet 11   metFORMIN  (GLUCOPHAGE -XR) 500 MG 24 hr tablet Take 1 tablet (500 mg total) by mouth daily with breakfast. 90 tablet 3   montelukast  (SINGULAIR ) 10 MG tablet TAKE 1 TABLET BY MOUTH AT  BEDTIME 100 tablet 2   Multiple Vitamin (MULTIVITAMIN) tablet Take 1 tablet by mouth daily.     Omega-3 Fatty Acids (FISH OIL) 1200 MG CAPS Take 1,200 mg by mouth daily.      pantoprazole  (PROTONIX ) 40 MG tablet TAKE 1 TABLET BY MOUTH DAILY 100 tablet 2   Vitamin D , Ergocalciferol , (DRISDOL ) 1.25 MG (50000 UNIT) CAPS capsule Take 1 capsule (50,000 Units total) by mouth every 7 (seven) days. 12 capsule 0   traZODone  (DESYREL ) 50 MG tablet TAKE 1/2 TO 1 TABLET BY MOUTH AT BEDTIME AS NEEDED FOR SLEEP (Patient not taking: Reported on 09/28/2024) 100 tablet 1   No facility-administered medications prior to visit.    PAST MEDICAL HISTORY: Past Medical History:  Diagnosis Date   Anxiety    Arthritis    Contact lens/glasses fitting    wears contacts or glasses   Depression    Diabetes mellitus without complication (HCC)    No meds   Diverticulosis    Family history of adverse reaction to anesthesia    pts sister has severe N/V   Fibromyalgia    Gastritis    GERD  (gastroesophageal reflux disease)    Hemorrhoid    Hiatal hernia    neuropathy   Migraine    Neuropathy    Panic attacks    Pneumonia    as an infant    PAST SURGICAL HISTORY: Past Surgical History:  Procedure Laterality Date   ANTERIOR CERVICAL DECOMP/DISCECTOMY FUSION N/A 09/12/2014   Procedure: Evacuation of cervical hematoma;  Surgeon: Victory DELENA Gunnels, MD;  Location: [facility];  Service: Neurosurgery;  Laterality: N/A;   ANTERIOR CERVICAL DECOMP/DISCECTOMY FUSION N/A 09/12/2014   Procedure: CERVICAL FOUR-FIVE, CERVICAL FIVE-SIX, CERVICAL SIX-SEVEN ANTERIOR CERVICAL DECOMPRESSION/DISCECTOMY FUSION;  Surgeon: Victory DELENA Gunnels, MD;  Location: [facility];  Service: Neurosurgery;  Laterality: N/A;   BUNIONECTOMY     CARPAL TUNNEL RELEASE  2002   right   COLONOSCOPY     JOINT REPLACEMENT Left    SHOULDER ARTHROSCOPY  WITH ROTATOR CUFF REPAIR  2010   right   TOTAL HIP ARTHROPLASTY Left 11/20/2017   Procedure: LEFT TOTAL HIP ARTHROPLASTY ANTERIOR APPROACH WITH BONE GRAFTING AND ACETABULAR SCREWS;  Surgeon: Yvone Rush, MD;  Location: [facility];  Service: Orthopedics;  Laterality: Left;   TOTAL KNEE ARTHROPLASTY Right 03/11/2019   Procedure: TOTAL KNEE ARTHROPLASTY;  Surgeon: Yvone Rush, MD;  Location: [facility];  Service: Orthopedics;  Laterality: Right;   TRIGGER FINGER RELEASE Right 03/23/2013   Procedure: RELEASE TRIGGER FINGER/A-1 PULLEY RIGHT RING FINGER;  Surgeon: Arley JONELLE Curia, MD;  Location: [facility];  Service: Orthopedics;  Laterality: Right;   TUBAL LIGATION     UPPER GI ENDOSCOPY      FAMILY HISTORY: Family History  Problem Relation Age of Onset   Diabetes Sister    Anxiety disorder Sister    Heart disease Brother    Ovarian cancer Cousin    Breast cancer Maternal Grandmother        grandmother   Diabetes Maternal Aunt        aunt   Alcohol abuse Maternal Uncle        uncle   Diabetes Paternal Aunt    Alcohol abuse Paternal Uncle     SOCIAL  HISTORY: Social History   Socioeconomic History   Marital status: Divorced    Spouse name: Not on file   Number of children: 1   Years of education: Not on file   Highest education level: Some college, no degree  Occupational History    Employer: [employer]  Tobacco Use   Smoking status: Never   Smokeless tobacco: Never  Vaping Use   Vaping status: Never Used  Substance and Sexual Activity   Alcohol use: No    Alcohol/week: 0.0 standard drinks of alcohol   Drug use: No   Sexual activity: Yes  Other Topics Concern   Not on file  Social History Narrative   Lives alone in a one story home.  Has one child.     Currently not working.     Worked for [employer].     Social Drivers of Health   Tobacco Use: Low Risk (09/28/2024)   Patient History    Smoking Tobacco Use: Never    Smokeless Tobacco Use: Never    Passive Exposure: Not on file  Financial Resource Strain: Low Risk (06/13/2024)   Overall Financial Resource Strain (CARDIA)    Difficulty of Paying Living Expenses: Not very hard  Food Insecurity: No Food Insecurity (06/13/2024)   Epic    Worried About Programme Researcher, Broadcasting/film/video in the Last Year: Never true    Ran Out of Food in the Last Year: Never true  Transportation Needs: No Transportation Needs (06/13/2024)   Epic    Lack of Transportation (Medical): No    Lack of Transportation (Non-Medical): No  Physical Activity: Insufficiently Active (06/13/2024)   Exercise Vital Sign    Days of Exercise per Week: 3 days    Minutes of Exercise per Session: 20 min  Stress: Stress Concern Present (06/13/2024)   [employer] of Occupational Health - Occupational Stress Questionnaire    Feeling of Stress: To some extent  Social Connections: Socially Isolated (06/13/2024)   Social Connection and Isolation Panel    Frequency of Communication with Friends and Family: Once a week    Frequency of Social Gatherings with Friends and Family: Never    Attends Religious Services: Never     Production Manager of [employer]  or Organizations: No    Attends Banker Meetings: Not on file    Marital Status: Divorced  Intimate Partner Violence: Not on file  Depression (PHQ2-9): High Risk (06/14/2024)   Depression (PHQ2-9)    PHQ-2 Score: 12  Alcohol Screen: Not on file  Housing: Low Risk (06/13/2024)   Epic    Unable to Pay for Housing in the Last Year: No    Number of Times Moved in the Last Year: 0    Homeless in the Last Year: No  Utilities: Not on file  Health Literacy: Not on file    PHYSICAL EXAM  GENERAL EXAM/CONSTITUTIONAL: Vitals:  Vitals:   09/28/24 1026  BP: (!) 142/79  Pulse: 91  SpO2: 95%  Weight: 142 lb (64.4 kg)  Height: 5' 1 (1.549 m)   Body mass index is 26.83 kg/m. Wt Readings from Last 3 Encounters:  09/28/24 142 lb (64.4 kg)  06/14/24 151 lb 6 oz (68.7 kg)  04/13/24 249 lb (112.9 kg)   Patient is in no distress; well developed, nourished and groomed; neck is supple  MUSCULOSKELETAL: Gait, strength, tone, movements noted in Neurologic exam below  NEUROLOGIC: MENTAL STATUS:     12/11/2020    1:32 PM 06/16/2016    9:00 AM  MMSE - Mini Mental State Exam  Not completed:  Unable to complete  Orientation to time 5   Orientation to Place 5   Registration 3   Attention/ Calculation 5   Recall 3   Language- repeat 1       09/28/2024   10:28 AM 05/20/2016    9:13 AM  Montreal Cognitive Assessment   Visuospatial/ Executive (0/5) 4 4  Naming (0/3) 3 3  Attention: Read list of digits (0/2) 2 1  Attention: Read list of letters (0/1) 1 1  Attention: Serial 7 subtraction starting at 100 (0/3) 3 1  Language: Repeat phrase (0/2) 2 1  Language : Fluency (0/1) 1 0  Abstraction (0/2) 2 1  Delayed Recall (0/5) 3 2  Orientation (0/6) 6 6  Total 27 20  Adjusted Score (based on education) 27 20    awake, alert, oriented to person, place and time recent and remote memory intact Difficulty with attention language fluent, comprehension  intact, naming intact fund of knowledge appropriate  CRANIAL NERVE:  2nd, 3rd, 4th, 6th- visual fields full to confrontation, extraocular muscles intact, no nystagmus 5th - facial sensation symmetric 7th - facial strength symmetric 8th - hearing intact 9th - palate elevates symmetrically, uvula midline 11th - shoulder shrug symmetric 12th - tongue protrusion midline  MOTOR:  normal bulk and tone, full strength in the BUE, BLE  SENSORY:  normal and symmetric to light touch  COORDINATION:  finger-nose-finger, fine finger movements normal  GAIT/STATION:  Antalgic  gait due to recent hip replacement    DIAGNOSTIC DATA (LABS, IMAGING, TESTING) - I reviewed patient records, labs, notes, testing and imaging myself where available.  Lab Results  Component Value Date   WBC 12.8 (H) 03/12/2019   HGB 12.6 03/12/2019   HCT 40.1 03/12/2019   MCV 92.4 03/12/2019   PLT 353 03/12/2019      Component Value Date/Time   NA 139 06/14/2024 1114   NA 141 07/31/2014 0834   K 3.5 06/14/2024 1114   CL 106 06/14/2024 1114   CO2 27 06/14/2024 1114   GLUCOSE 122 (H) 06/14/2024 1114   BUN 20 06/14/2024 1114   BUN 15 07/31/2014 0834   CREATININE  0.62 06/14/2024 1114   CREATININE 0.64 09/16/2019 1630   CALCIUM  10.5 06/14/2024 1114   PROT 7.2 06/14/2024 1114   PROT 6.8 07/31/2014 0834   ALBUMIN 4.3 06/14/2024 1114   ALBUMIN 4.0 07/31/2014 0834   AST 14 06/14/2024 1114   ALT 15 06/14/2024 1114   ALKPHOS 71 06/14/2024 1114   BILITOT 0.5 06/14/2024 1114   GFRNONAA >60 03/04/2019 1052   GFRAA >60 03/04/2019 1052   Lab Results  Component Value Date   CHOL 118 06/14/2024   HDL 52.30 06/14/2024   LDLCALC 50 06/14/2024   TRIG 78.0 06/14/2024   CHOLHDL 2 06/14/2024   Lab Results  Component Value Date   HGBA1C 7.3 (H) 06/14/2024   Lab Results  Component Value Date   VITAMINB12 >1500 (H) 06/14/2024   Lab Results  Component Value Date   TSH 0.88 12/16/2023    Head CT  12/247/2024 1. No acute intracranial abnormality.    ASSESSMENT AND PLAN  68 y.o. year old female with    Attention-deficit hyperactivity disorder ADHD with difficulty focusing and misplacing items. Currently on Adderall, which is not effective per patient. She is managed by her psychiatrist. Symptoms include forgetfulness, misplacing items, and difficulty with attention. No evidence of dementia based on cognitive testing (score of 27/30). - Discuss with psychiatrist about changing ADHD medication due to ineffectiveness of Adderall.  Cognitive impairment Characterized by forgetfulness, misplacing items, and difficulty with attention. No evidence of dementia based on cognitive testing (score of 27/30). Differential diagnosis includes ADHD as the primary cause of symptoms. Blood work and brain MRI are planned to rule out other causes of memory loss. Biomarkers for Alzheimer dementia will be checked; a positive result indicates a high risk of future dementia, while a negative result supports ADHD as the primary issue. - Ordered blood work to check for causes of memory loss, including biomarkers for Alzheimer dementia. - Ordered brain MRI as part of the workup. - Follow up with psychiatrist for ADHD management.     1. Cognitive impairment   2. Attention deficit hyperactivity disorder (ADHD), unspecified ADHD type     Patient Instructions  Will check ATN profile  Continue to follow up with psychiatry for management of ADHD  Return as needed    There are well-accepted and sensible ways to reduce risk for Alzheimers disease and other degenerative brain disorders .  Exercise Daily Walk A daily 20 minute walk should be part of your routine. Disease related apathy can be a significant roadblock to exercise and the only way to overcome this is to make it a daily routine and perhaps have a reward at the end (something your loved one loves to eat or drink perhaps) or a personal trainer coming to  the home can also be very useful. Most importantly, the patient is much more likely to exercise if the caregiver / spouse does it with him/her. In general a structured, repetitive schedule is best.  General Health: Any diseases which effect your body will effect your brain such as a pneumonia, urinary infection, blood clot, heart attack or stroke. Keep contact with your primary care doctor for regular follow ups.  Sleep. A good nights sleep is healthy for the brain. Seven hours is recommended. If you have insomnia or poor sleep habits we can give you some instructions. If you have sleep apnea wear your mask.  Diet: Eating a heart healthy diet is also a good idea; fish and poultry instead of red meat, nuts (mostly  non-peanuts), vegetables, fruits, olive oil or canola oil (instead of butter), minimal salt (use other spices to flavor foods), whole grain rice, bread, cereal and pasta and wine in moderation.Research is now showing that the MIND diet, which is a combination of The Mediterranean diet and the DASH diet, is beneficial for cognitive processing and longevity. Information about this diet can be found in The MIND Diet, a book by Annitta Feeling, MS, RDN, and online at wildwildscience.es  Finances, Power of [address] and Advance Directives: You should consider putting legal safeguards in place with regard to financial and medical decision making. While the spouse always has power of attorney for medical and financial issues in the absence of any form, you should consider what you want in case the spouse / caregiver is no longer around or capable of making decisions.    Orders Placed This Encounter  Procedures   ATN PROFILE   APOE Alzheimer's Risk    No orders of the defined types were placed in this encounter.   Return if symptoms worsen or fail to improve.  I personally spent a total of 65 minutes in the care of the patient today including preparing to see the  patient, getting/reviewing separately obtained history, performing a medically appropriate exam/evaluation, counseling and educating, placing orders, and documenting clinical information in the EHR.   Pastor Falling, MD 09/28/2024, 12:37 PM  [facility] [address] [place], [place] [address] [phone]     [1]  Allergies Allergen Reactions   Hydrochlorothiazide  Rash   Sulfonamide Derivatives Rash   "

## 2024-09-28 NOTE — Patient Instructions (Addendum)
 Will check ATN profile  Continue to follow up with psychiatry for management of ADHD  Return as needed    There are well-accepted and sensible ways to reduce risk for Alzheimers disease and other degenerative brain disorders .  Exercise Daily Walk A daily 20 minute walk should be part of your routine. Disease related apathy can be a significant roadblock to exercise and the only way to overcome this is to make it a daily routine and perhaps have a reward at the end (something your loved one loves to eat or drink perhaps) or a personal trainer coming to the home can also be very useful. Most importantly, the patient is much more likely to exercise if the caregiver / spouse does it with him/her. In general a structured, repetitive schedule is best.  General Health: Any diseases which effect your body will effect your brain such as a pneumonia, urinary infection, blood clot, heart attack or stroke. Keep contact with your primary care doctor for regular follow ups.  Sleep. A good nights sleep is healthy for the brain. Seven hours is recommended. If you have insomnia or poor sleep habits we can give you some instructions. If you have sleep apnea wear your mask.  Diet: Eating a heart healthy diet is also a good idea; fish and poultry instead of red meat, nuts (mostly non-peanuts), vegetables, fruits, olive oil or canola oil (instead of butter), minimal salt (use other spices to flavor foods), whole grain rice, bread, cereal and pasta and wine in moderation.Research is now showing that the MIND diet, which is a combination of The Mediterranean diet and the DASH diet, is beneficial for cognitive processing and longevity. Information about this diet can be found in The MIND Diet, a book by Annitta Feeling, MS, RDN, and online at wildwildscience.es  Finances, Power of [address] and Advance Directives: You should consider putting legal safeguards in place with regard to financial and  medical decision making. While the spouse always has power of attorney for medical and financial issues in the absence of any form, you should consider what you want in case the spouse / caregiver is no longer around or capable of making decisions.

## 2024-10-05 ENCOUNTER — Other Ambulatory Visit

## 2024-10-09 LAB — APOE ALZHEIMER'S DISEASE RISK

## 2024-10-09 LAB — ATN PROFILE
A -- Beta-amyloid 42/40 Ratio: 0.119
Beta-amyloid 40: 196.98 pg/mL
Beta-amyloid 42: 23.42 pg/mL
N -- NfL, Plasma: 5.59 pg/mL (ref 0.00–6.04)
T -- p-tau181: 0.58 pg/mL (ref 0.00–0.97)

## 2024-10-10 ENCOUNTER — Ambulatory Visit: Admitting: "Endocrinology

## 2024-10-10 ENCOUNTER — Ambulatory Visit: Payer: Self-pay | Admitting: Neurology

## 2024-12-07 ENCOUNTER — Other Ambulatory Visit

## 2024-12-12 ENCOUNTER — Ambulatory Visit: Admitting: "Endocrinology
